# Patient Record
Sex: Female | Born: 1935 | Race: White | Hispanic: No | Smoking: Never smoker
Health system: Southern US, Community
[De-identification: ages and names within clinical notes are randomized; demographics above are authoritative.]

## PROBLEM LIST (undated history)

## (undated) DIAGNOSIS — A159 Respiratory tuberculosis unspecified: Secondary | ICD-10-CM

## (undated) DIAGNOSIS — F319 Bipolar disorder, unspecified: Secondary | ICD-10-CM

## (undated) DIAGNOSIS — M12819 Other specific arthropathies, not elsewhere classified, unspecified shoulder: Secondary | ICD-10-CM

## (undated) DIAGNOSIS — M545 Low back pain, unspecified: Secondary | ICD-10-CM

## (undated) DIAGNOSIS — I10 Essential (primary) hypertension: Secondary | ICD-10-CM

## (undated) DIAGNOSIS — R61 Generalized hyperhidrosis: Secondary | ICD-10-CM

## (undated) DIAGNOSIS — E669 Obesity, unspecified: Secondary | ICD-10-CM

## (undated) DIAGNOSIS — I2510 Atherosclerotic heart disease of native coronary artery without angina pectoris: Secondary | ICD-10-CM

## (undated) DIAGNOSIS — M1990 Unspecified osteoarthritis, unspecified site: Secondary | ICD-10-CM

## (undated) DIAGNOSIS — C801 Malignant (primary) neoplasm, unspecified: Secondary | ICD-10-CM

## (undated) DIAGNOSIS — K922 Gastrointestinal hemorrhage, unspecified: Secondary | ICD-10-CM

## (undated) DIAGNOSIS — F329 Major depressive disorder, single episode, unspecified: Secondary | ICD-10-CM

## (undated) DIAGNOSIS — E785 Hyperlipidemia, unspecified: Secondary | ICD-10-CM

## (undated) DIAGNOSIS — F32A Depression, unspecified: Secondary | ICD-10-CM

## (undated) DIAGNOSIS — J45909 Unspecified asthma, uncomplicated: Secondary | ICD-10-CM

## (undated) DIAGNOSIS — N83209 Unspecified ovarian cyst, unspecified side: Secondary | ICD-10-CM

## (undated) DIAGNOSIS — Q2730 Arteriovenous malformation, site unspecified: Secondary | ICD-10-CM

## (undated) DIAGNOSIS — D649 Anemia, unspecified: Secondary | ICD-10-CM

## (undated) DIAGNOSIS — K449 Diaphragmatic hernia without obstruction or gangrene: Secondary | ICD-10-CM

## (undated) DIAGNOSIS — S6991XA Unspecified injury of right wrist, hand and finger(s), initial encounter: Secondary | ICD-10-CM

## (undated) DIAGNOSIS — R0602 Shortness of breath: Secondary | ICD-10-CM

## (undated) DIAGNOSIS — R569 Unspecified convulsions: Secondary | ICD-10-CM

## (undated) HISTORY — DX: Major depressive disorder, single episode, unspecified: F32.9

## (undated) HISTORY — PX: CATARACT EXTRACTION: SUR2

## (undated) HISTORY — PX: HIATAL HERNIA REPAIR: SHX195

## (undated) HISTORY — PX: BREAST REDUCTION SURGERY: SHX8

## (undated) HISTORY — PX: CRANIOPLASTY: SUR330

## (undated) HISTORY — PX: CYSTECTOMY: SHX5119

## (undated) HISTORY — DX: Depression, unspecified: F32.A

## (undated) HISTORY — PX: KNEE ARTHROPLASTY: SHX992

## (undated) HISTORY — PX: ABDOMINAL HYSTERECTOMY: SHX81

---
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
## 1997-11-21 ENCOUNTER — Encounter: Admission: RE | Admit: 1997-11-21 | Discharge: 1997-11-21 | Payer: Self-pay | Admitting: Family Medicine

## 1998-01-25 ENCOUNTER — Encounter: Admission: RE | Admit: 1998-01-25 | Discharge: 1998-01-25 | Payer: Self-pay | Admitting: Family Medicine

## 1998-06-21 ENCOUNTER — Encounter: Admission: RE | Admit: 1998-06-21 | Discharge: 1998-06-21 | Payer: Self-pay | Admitting: Family Medicine

## 1998-06-22 ENCOUNTER — Encounter: Admission: RE | Admit: 1998-06-22 | Discharge: 1998-06-22 | Payer: Self-pay | Admitting: Family Medicine

## 1998-07-05 ENCOUNTER — Encounter: Admission: RE | Admit: 1998-07-05 | Discharge: 1998-07-05 | Payer: Self-pay | Admitting: Family Medicine

## 1998-10-16 ENCOUNTER — Encounter: Admission: RE | Admit: 1998-10-16 | Discharge: 1998-10-16 | Payer: Self-pay | Admitting: Family Medicine

## 1998-11-06 ENCOUNTER — Encounter: Admission: RE | Admit: 1998-11-06 | Discharge: 1998-11-06 | Payer: Self-pay | Admitting: Family Medicine

## 1999-04-27 ENCOUNTER — Ambulatory Visit (HOSPITAL_COMMUNITY): Admission: RE | Admit: 1999-04-27 | Discharge: 1999-04-27 | Payer: Self-pay | Admitting: Neurology

## 1999-05-08 ENCOUNTER — Ambulatory Visit (HOSPITAL_COMMUNITY): Admission: RE | Admit: 1999-05-08 | Discharge: 1999-05-08 | Payer: Self-pay | Admitting: Gastroenterology

## 1999-05-08 ENCOUNTER — Encounter (INDEPENDENT_AMBULATORY_CARE_PROVIDER_SITE_OTHER): Payer: Self-pay

## 1999-06-18 ENCOUNTER — Encounter: Admission: RE | Admit: 1999-06-18 | Discharge: 1999-06-18 | Payer: Self-pay | Admitting: Family Medicine

## 1999-08-16 ENCOUNTER — Encounter: Admission: RE | Admit: 1999-08-16 | Discharge: 1999-08-16 | Payer: Self-pay | Admitting: Family Medicine

## 1999-08-20 ENCOUNTER — Encounter: Admission: RE | Admit: 1999-08-20 | Discharge: 1999-08-20 | Payer: Self-pay | Admitting: Family Medicine

## 2000-03-26 ENCOUNTER — Encounter: Admission: RE | Admit: 2000-03-26 | Discharge: 2000-03-26 | Payer: Self-pay | Admitting: Family Medicine

## 2000-05-06 ENCOUNTER — Encounter: Admission: RE | Admit: 2000-05-06 | Discharge: 2000-05-06 | Payer: Self-pay | Admitting: Sports Medicine

## 2000-05-23 ENCOUNTER — Encounter: Admission: RE | Admit: 2000-05-23 | Discharge: 2000-05-23 | Payer: Self-pay | Admitting: Family Medicine

## 2000-06-09 ENCOUNTER — Encounter: Admission: RE | Admit: 2000-06-09 | Discharge: 2000-06-09 | Payer: Self-pay | Admitting: Family Medicine

## 2000-10-13 ENCOUNTER — Encounter: Admission: RE | Admit: 2000-10-13 | Discharge: 2000-10-13 | Payer: Self-pay | Admitting: Family Medicine

## 2000-12-16 ENCOUNTER — Encounter: Admission: RE | Admit: 2000-12-16 | Discharge: 2000-12-16 | Payer: Self-pay | Admitting: Family Medicine

## 2001-02-16 ENCOUNTER — Encounter: Admission: RE | Admit: 2001-02-16 | Discharge: 2001-02-16 | Payer: Self-pay | Admitting: Sports Medicine

## 2001-04-23 ENCOUNTER — Encounter: Admission: RE | Admit: 2001-04-23 | Discharge: 2001-04-23 | Payer: Self-pay | Admitting: *Deleted

## 2001-04-23 ENCOUNTER — Encounter: Admission: RE | Admit: 2001-04-23 | Discharge: 2001-04-23 | Payer: Self-pay | Admitting: Family Medicine

## 2001-04-23 ENCOUNTER — Encounter: Payer: Self-pay | Admitting: *Deleted

## 2001-05-07 ENCOUNTER — Encounter: Admission: RE | Admit: 2001-05-07 | Discharge: 2001-05-07 | Payer: Self-pay | Admitting: Sports Medicine

## 2002-01-01 ENCOUNTER — Encounter: Admission: RE | Admit: 2002-01-01 | Discharge: 2002-01-01 | Payer: Self-pay | Admitting: Family Medicine

## 2002-01-19 ENCOUNTER — Encounter: Admission: RE | Admit: 2002-01-19 | Discharge: 2002-01-19 | Payer: Self-pay | Admitting: Sports Medicine

## 2002-05-24 ENCOUNTER — Encounter: Admission: RE | Admit: 2002-05-24 | Discharge: 2002-05-24 | Payer: Self-pay | Admitting: Family Medicine

## 2002-06-08 ENCOUNTER — Encounter: Admission: RE | Admit: 2002-06-08 | Discharge: 2002-06-08 | Payer: Self-pay | Admitting: Family Medicine

## 2002-06-22 ENCOUNTER — Encounter: Admission: RE | Admit: 2002-06-22 | Discharge: 2002-06-22 | Payer: Self-pay | Admitting: Family Medicine

## 2002-06-22 ENCOUNTER — Ambulatory Visit (HOSPITAL_COMMUNITY): Admission: RE | Admit: 2002-06-22 | Discharge: 2002-06-22 | Payer: Self-pay | Admitting: Family Medicine

## 2002-10-14 ENCOUNTER — Encounter: Admission: RE | Admit: 2002-10-14 | Discharge: 2002-10-14 | Payer: Self-pay | Admitting: Family Medicine

## 2003-04-19 ENCOUNTER — Encounter: Payer: Self-pay | Admitting: Ophthalmology

## 2003-04-22 ENCOUNTER — Ambulatory Visit (HOSPITAL_COMMUNITY): Admission: RE | Admit: 2003-04-22 | Discharge: 2003-04-22 | Payer: Self-pay | Admitting: Ophthalmology

## 2003-06-01 ENCOUNTER — Encounter: Admission: RE | Admit: 2003-06-01 | Discharge: 2003-06-01 | Payer: Self-pay | Admitting: Family Medicine

## 2003-11-09 ENCOUNTER — Encounter: Admission: RE | Admit: 2003-11-09 | Discharge: 2003-11-09 | Payer: Self-pay | Admitting: Family Medicine

## 2003-11-25 ENCOUNTER — Ambulatory Visit (HOSPITAL_COMMUNITY): Admission: RE | Admit: 2003-11-25 | Discharge: 2003-11-25 | Payer: Self-pay | Admitting: Ophthalmology

## 2003-12-28 ENCOUNTER — Ambulatory Visit (HOSPITAL_COMMUNITY): Admission: RE | Admit: 2003-12-28 | Discharge: 2003-12-28 | Payer: Self-pay | Admitting: Ophthalmology

## 2004-03-26 ENCOUNTER — Encounter: Admission: RE | Admit: 2004-03-26 | Discharge: 2004-03-26 | Payer: Self-pay | Admitting: Sports Medicine

## 2004-05-29 ENCOUNTER — Ambulatory Visit: Payer: Self-pay | Admitting: Family Medicine

## 2004-07-12 ENCOUNTER — Encounter (INDEPENDENT_AMBULATORY_CARE_PROVIDER_SITE_OTHER): Payer: Self-pay | Admitting: *Deleted

## 2004-09-10 ENCOUNTER — Ambulatory Visit: Payer: Self-pay | Admitting: Family Medicine

## 2005-02-25 ENCOUNTER — Ambulatory Visit: Payer: Self-pay | Admitting: Sports Medicine

## 2005-06-14 ENCOUNTER — Ambulatory Visit: Payer: Self-pay | Admitting: Family Medicine

## 2005-06-24 ENCOUNTER — Ambulatory Visit: Payer: Self-pay | Admitting: Family Medicine

## 2006-06-10 ENCOUNTER — Ambulatory Visit (HOSPITAL_COMMUNITY): Admission: RE | Admit: 2006-06-10 | Discharge: 2006-06-10 | Payer: Self-pay | Admitting: Family Medicine

## 2006-06-10 ENCOUNTER — Ambulatory Visit: Payer: Self-pay | Admitting: Sports Medicine

## 2006-09-10 ENCOUNTER — Inpatient Hospital Stay (HOSPITAL_COMMUNITY): Admission: RE | Admit: 2006-09-10 | Discharge: 2006-09-14 | Payer: Self-pay | Admitting: Orthopedic Surgery

## 2006-10-06 ENCOUNTER — Encounter: Admission: RE | Admit: 2006-10-06 | Discharge: 2006-10-24 | Payer: Self-pay | Admitting: Orthopedic Surgery

## 2006-10-09 DIAGNOSIS — M159 Polyosteoarthritis, unspecified: Secondary | ICD-10-CM | POA: Insufficient documentation

## 2006-10-09 DIAGNOSIS — R569 Unspecified convulsions: Secondary | ICD-10-CM | POA: Insufficient documentation

## 2006-10-09 DIAGNOSIS — E7800 Pure hypercholesterolemia, unspecified: Secondary | ICD-10-CM

## 2006-10-09 DIAGNOSIS — I10 Essential (primary) hypertension: Secondary | ICD-10-CM

## 2006-10-09 DIAGNOSIS — F411 Generalized anxiety disorder: Secondary | ICD-10-CM | POA: Insufficient documentation

## 2006-10-09 DIAGNOSIS — I209 Angina pectoris, unspecified: Secondary | ICD-10-CM

## 2006-10-09 DIAGNOSIS — M1990 Unspecified osteoarthritis, unspecified site: Secondary | ICD-10-CM

## 2006-10-09 DIAGNOSIS — M069 Rheumatoid arthritis, unspecified: Secondary | ICD-10-CM | POA: Insufficient documentation

## 2006-10-09 DIAGNOSIS — F339 Major depressive disorder, recurrent, unspecified: Secondary | ICD-10-CM

## 2006-10-09 DIAGNOSIS — G609 Hereditary and idiopathic neuropathy, unspecified: Secondary | ICD-10-CM

## 2006-10-10 ENCOUNTER — Encounter (INDEPENDENT_AMBULATORY_CARE_PROVIDER_SITE_OTHER): Payer: Self-pay | Admitting: *Deleted

## 2006-10-17 ENCOUNTER — Telehealth: Payer: Self-pay | Admitting: *Deleted

## 2006-10-17 ENCOUNTER — Ambulatory Visit: Payer: Self-pay | Admitting: Sports Medicine

## 2007-03-09 ENCOUNTER — Encounter: Payer: Self-pay | Admitting: Family Medicine

## 2007-03-10 ENCOUNTER — Telehealth: Payer: Self-pay | Admitting: Family Medicine

## 2007-04-10 ENCOUNTER — Ambulatory Visit: Payer: Self-pay | Admitting: Family Medicine

## 2007-04-14 ENCOUNTER — Telehealth: Payer: Self-pay | Admitting: Family Medicine

## 2007-04-16 ENCOUNTER — Encounter: Payer: Self-pay | Admitting: Family Medicine

## 2007-06-15 ENCOUNTER — Encounter: Payer: Self-pay | Admitting: Family Medicine

## 2007-06-25 ENCOUNTER — Ambulatory Visit: Payer: Self-pay | Admitting: Family Medicine

## 2007-09-04 ENCOUNTER — Ambulatory Visit (HOSPITAL_COMMUNITY): Admission: RE | Admit: 2007-09-04 | Discharge: 2007-09-04 | Payer: Self-pay | Admitting: Family Medicine

## 2007-09-04 ENCOUNTER — Encounter: Payer: Self-pay | Admitting: Family Medicine

## 2007-09-04 ENCOUNTER — Ambulatory Visit: Payer: Self-pay | Admitting: Family Medicine

## 2007-09-04 DIAGNOSIS — R002 Palpitations: Secondary | ICD-10-CM | POA: Insufficient documentation

## 2007-09-07 ENCOUNTER — Telehealth: Payer: Self-pay | Admitting: Family Medicine

## 2007-09-07 LAB — CONVERTED CEMR LAB
ALT: 17 units/L (ref 0–35)
AST: 23 units/L (ref 0–37)
Albumin: 4.4 g/dL (ref 3.5–5.2)
Alkaline Phosphatase: 78 units/L (ref 39–117)
Calcium: 9.4 mg/dL (ref 8.4–10.5)
Chloride: 103 meq/L (ref 96–112)
Cholesterol: 173 mg/dL (ref 0–200)
HDL: 46 mg/dL (ref >39)
LDL Cholesterol: 92 mg/dL (ref 0–99)
TSH: 1.104 microintl units/mL (ref 0.350–5.50)
Total Bilirubin: 0.6 mg/dL (ref 0.3–1.2)
Total CHOL/HDL Ratio: 3.8
Total Protein: 7 g/dL (ref 6.0–8.3)
VLDL: 35 mg/dL (ref 0–40)

## 2007-09-09 ENCOUNTER — Encounter: Payer: Self-pay | Admitting: Family Medicine

## 2007-09-09 ENCOUNTER — Inpatient Hospital Stay (HOSPITAL_COMMUNITY): Admission: RE | Admit: 2007-09-09 | Discharge: 2007-09-12 | Payer: Self-pay | Admitting: Orthopedic Surgery

## 2007-09-25 ENCOUNTER — Telehealth: Payer: Self-pay | Admitting: *Deleted

## 2007-10-26 ENCOUNTER — Telehealth (INDEPENDENT_AMBULATORY_CARE_PROVIDER_SITE_OTHER): Payer: Self-pay | Admitting: *Deleted

## 2007-11-26 ENCOUNTER — Encounter: Payer: Self-pay | Admitting: Family Medicine

## 2008-01-26 ENCOUNTER — Telehealth: Payer: Self-pay | Admitting: Family Medicine

## 2008-02-10 ENCOUNTER — Ambulatory Visit: Payer: Self-pay | Admitting: Family Medicine

## 2008-05-06 ENCOUNTER — Ambulatory Visit: Payer: Self-pay | Admitting: Family Medicine

## 2008-05-06 ENCOUNTER — Encounter: Payer: Self-pay | Admitting: *Deleted

## 2008-06-30 ENCOUNTER — Encounter (INDEPENDENT_AMBULATORY_CARE_PROVIDER_SITE_OTHER): Payer: Self-pay | Admitting: Family Medicine

## 2008-07-26 ENCOUNTER — Ambulatory Visit: Payer: Self-pay | Admitting: Family Medicine

## 2008-08-15 ENCOUNTER — Ambulatory Visit: Payer: Self-pay | Admitting: Family Medicine

## 2008-08-16 ENCOUNTER — Encounter (INDEPENDENT_AMBULATORY_CARE_PROVIDER_SITE_OTHER): Payer: Self-pay | Admitting: Family Medicine

## 2008-08-18 ENCOUNTER — Encounter: Admission: RE | Admit: 2008-08-18 | Discharge: 2008-10-25 | Payer: Self-pay | Admitting: Neurology

## 2008-08-26 ENCOUNTER — Encounter (INDEPENDENT_AMBULATORY_CARE_PROVIDER_SITE_OTHER): Payer: Self-pay | Admitting: Family Medicine

## 2008-08-29 ENCOUNTER — Encounter: Admission: RE | Admit: 2008-08-29 | Discharge: 2008-08-29 | Payer: Self-pay | Admitting: Neurology

## 2008-10-20 ENCOUNTER — Encounter: Admission: RE | Admit: 2008-10-20 | Discharge: 2008-10-20 | Payer: Self-pay | Admitting: Neurology

## 2008-10-24 ENCOUNTER — Encounter (INDEPENDENT_AMBULATORY_CARE_PROVIDER_SITE_OTHER): Payer: Self-pay | Admitting: Family Medicine

## 2008-10-27 ENCOUNTER — Encounter (INDEPENDENT_AMBULATORY_CARE_PROVIDER_SITE_OTHER): Payer: Self-pay | Admitting: Family Medicine

## 2009-02-20 ENCOUNTER — Telehealth: Payer: Self-pay | Admitting: Family Medicine

## 2009-02-21 ENCOUNTER — Ambulatory Visit: Payer: Self-pay | Admitting: Family Medicine

## 2009-02-21 DIAGNOSIS — N63 Unspecified lump in unspecified breast: Secondary | ICD-10-CM | POA: Insufficient documentation

## 2009-03-02 ENCOUNTER — Encounter: Payer: Self-pay | Admitting: Family Medicine

## 2009-03-02 ENCOUNTER — Ambulatory Visit: Payer: Self-pay | Admitting: Family Medicine

## 2009-03-02 ENCOUNTER — Inpatient Hospital Stay (HOSPITAL_COMMUNITY): Admission: EM | Admit: 2009-03-02 | Discharge: 2009-03-14 | Payer: Self-pay | Admitting: Emergency Medicine

## 2009-03-06 ENCOUNTER — Encounter (INDEPENDENT_AMBULATORY_CARE_PROVIDER_SITE_OTHER): Payer: Self-pay | Admitting: Surgery

## 2009-03-08 ENCOUNTER — Encounter: Payer: Self-pay | Admitting: Family Medicine

## 2009-03-16 ENCOUNTER — Ambulatory Visit: Payer: Self-pay | Admitting: Family Medicine

## 2009-03-16 DIAGNOSIS — R0989 Other specified symptoms and signs involving the circulatory and respiratory systems: Secondary | ICD-10-CM

## 2009-03-16 DIAGNOSIS — R011 Cardiac murmur, unspecified: Secondary | ICD-10-CM

## 2009-03-17 ENCOUNTER — Telehealth: Payer: Self-pay | Admitting: Family Medicine

## 2009-03-20 ENCOUNTER — Telehealth: Payer: Self-pay | Admitting: Family Medicine

## 2009-03-21 ENCOUNTER — Encounter: Payer: Self-pay | Admitting: Family Medicine

## 2009-03-27 ENCOUNTER — Telehealth: Payer: Self-pay | Admitting: *Deleted

## 2009-04-04 ENCOUNTER — Encounter: Payer: Self-pay | Admitting: Family Medicine

## 2009-05-17 ENCOUNTER — Ambulatory Visit: Payer: Self-pay | Admitting: Family Medicine

## 2009-05-18 ENCOUNTER — Encounter: Payer: Self-pay | Admitting: Family Medicine

## 2009-06-06 ENCOUNTER — Ambulatory Visit: Payer: Self-pay | Admitting: Family Medicine

## 2009-06-06 ENCOUNTER — Encounter: Payer: Self-pay | Admitting: Family Medicine

## 2009-06-06 LAB — CONVERTED CEMR LAB
ALT: 12 units/L (ref 0–35)
Albumin: 4.1 g/dL (ref 3.5–5.2)
BUN: 16 mg/dL (ref 6–23)
CO2: 22 meq/L (ref 19–32)
Calcium: 9.4 mg/dL (ref 8.4–10.5)
Chloride: 105 meq/L (ref 96–112)
Cholesterol: 156 mg/dL (ref 0–200)
Glucose, Bld: 90 mg/dL (ref 70–99)
HDL: 53 mg/dL (ref >39)
LDL Cholesterol: 77 mg/dL (ref 0–99)
Potassium: 3.8 meq/L (ref 3.5–5.3)
Sodium: 141 meq/L (ref 135–145)
TSH: 1.504 microintl units/mL (ref 0.350–4.500)
Total Protein: 6.9 g/dL (ref 6.0–8.3)
VLDL: 26 mg/dL (ref 0–40)

## 2009-07-17 ENCOUNTER — Encounter: Payer: Self-pay | Admitting: Family Medicine

## 2009-10-26 ENCOUNTER — Telehealth: Payer: Self-pay | Admitting: Family Medicine

## 2009-10-27 ENCOUNTER — Ambulatory Visit: Payer: Self-pay | Admitting: Family Medicine

## 2009-11-10 ENCOUNTER — Encounter: Payer: Self-pay | Admitting: Family Medicine

## 2009-12-07 ENCOUNTER — Ambulatory Visit: Payer: Self-pay | Admitting: Family Medicine

## 2009-12-07 DIAGNOSIS — M940 Chondrocostal junction syndrome [Tietze]: Secondary | ICD-10-CM | POA: Insufficient documentation

## 2009-12-15 ENCOUNTER — Ambulatory Visit: Payer: Self-pay | Admitting: Family Medicine

## 2010-03-14 ENCOUNTER — Ambulatory Visit: Payer: Self-pay | Admitting: Family Medicine

## 2010-03-14 DIAGNOSIS — M67919 Unspecified disorder of synovium and tendon, unspecified shoulder: Secondary | ICD-10-CM | POA: Insufficient documentation

## 2010-03-14 DIAGNOSIS — M719 Bursopathy, unspecified: Secondary | ICD-10-CM

## 2010-03-14 DIAGNOSIS — R269 Unspecified abnormalities of gait and mobility: Secondary | ICD-10-CM | POA: Insufficient documentation

## 2010-03-29 ENCOUNTER — Encounter: Admission: RE | Admit: 2010-03-29 | Discharge: 2010-04-25 | Payer: Self-pay | Admitting: Family Medicine

## 2010-04-11 ENCOUNTER — Telehealth: Payer: Self-pay | Admitting: Family Medicine

## 2010-04-11 ENCOUNTER — Encounter: Payer: Self-pay | Admitting: Family Medicine

## 2010-05-08 ENCOUNTER — Telehealth: Payer: Self-pay | Admitting: *Deleted

## 2010-05-23 ENCOUNTER — Telehealth: Payer: Self-pay | Admitting: Family Medicine

## 2010-05-25 ENCOUNTER — Ambulatory Visit: Payer: Self-pay | Admitting: Family Medicine

## 2010-09-02 ENCOUNTER — Encounter: Payer: Self-pay | Admitting: Neurology

## 2010-09-02 ENCOUNTER — Encounter: Payer: Self-pay | Admitting: Orthopedic Surgery

## 2010-09-11 NOTE — Progress Notes (Signed)
Summary: Note Needed  Phone Note Call from Patient Call back at Home Phone [phone]   Caller: Patient Summary of Call: Pt stating that the letter he did needs to be on a rx pad.  The city will not except just a letter.  Can we mail this to her. Initial call taken by: Clydell Hakim,  May 08, 2010 8:54 AM  Follow-up for Phone Call        letter printed out on rx paper and mailed to pt Follow-up by: Loralee Pacas CMA,  May 08, 2010 9:11 AM

## 2010-09-11 NOTE — Assessment & Plan Note (Signed)
Summary: see notes/[place]   Vital Signs:  Patient profile:   77 year old female Height:      64.25 inches Weight:      220.0 pounds BMI:     37.61 Temp:     98.3 degrees F oral Pulse rate:   81 / minute BP sitting:   148 / 79  (left arm) Cuff size:   regular  Vitals Entered By: Gladstone Pih (October 27, 2009 2:19 PM) CC: C/O jaw pain   Primary Care Provider:  Romero Belling MD  CC:  C/O jaw pain.  History of Present Illness: Patient had a fleeting episode of jaw pain and swelling.  States that yesterday her jaw and lymph nodes were significantly swollen on her right side.  Seems to have returned to normal today.  Does have mild post nasal drip and symptoms of mild eustacian tube dysfunction.  Denies any tooth pain or problems Sees dentist regularly.  No fever or infectious exposure.   Habits & Providers  Alcohol-Tobacco-Diet     Tobacco Status: never  Current Medications (verified): 1)  Hyzaar 100-25 Mg  Tabs (Losartan Potassium-Hctz) .Marland Kitchen.. 1 Tab By Mouth Daily. 2)  Lipitor 10 Mg Tabs (Atorvastatin Calcium) .... Take 1 Tablet By Mouth At Bedtime 3)  Prozac 20 Mg  Caps (Fluoxetine Hcl) .... 3 Caps Daily 4)  Prilosec 20 Mg  Cpdr (Omeprazole) .... Take One Tablet Daily 5)  Ventolin Hfa 108 (90 Base) Mcg/act Aers (Albuterol Sulfate) .... Inhale One Puff Every 4-6 Hours As Needed 6)  Zovirax 5 % Oint (Acyclovir) .... Disp 15 Gm Tube.  Apply To Affected Area Three Times A Day During Outbreak  Allergies (verified): 1)  Amoxicillin (Amoxicillin) 2)  Dilantin (Phenytoin Sodium Extended) 3)  Morphine Sulfate (Morphine Sulfate) 4)  Phenobarbital (Phenobarbital)  Past History:  Past medical, surgical, family and social histories (including risk factors) reviewed, and no changes noted (except as noted below).  Past Medical History: Reviewed history from 08/26/2008 and no changes required. hiatal hernia hx of GI Bleed meningioma s/p resection 1988 remote carpal tunnel syndrome Mental  illness dx ranging from bipolar, depression w/ psychotic features, personality disorder starting in the early 1950s HTN  asthma? DEXA 08/16/08 - normal but statistically significant decrease in BMD compared to previous study, Needs FU in 2 years   Past Surgical History: Reviewed history from 03/16/2009 and no changes required. Hilatal hernia repair 03/2009 breast reduction - 09/04/2001 BTL - 09/04/2001 TAH R oophorectomy - 09/04/2001 1/88 - Brain Meningioma surgery on the right removed, was intubated s/p sugery  Family History: Reviewed history from 08/15/2008 and no changes required. Father w/ 2 suicide attempts 2 aunts committed suicide family hx of mental illness- dx unknown Father died 61 yo from MI. Mom - Alzheimers Brother - MI at age 5s for first heart attack. Bypass x 5.   Social History: Reviewed history from 08/15/2008 and no changes required. 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 Vegetarian almost. Says she has a good diet. 3 husbands in the past. No boyfriends now.  Not sexually active for many years.  Physical Exam  General:  Well-developed,well-nourished,in no acute distress; alert,appropriate and cooperative throughout examination Ears:  External ear exam shows no significant lesions or deformities.  Otoscopic examination reveals clear canals, tympanic membranes are intact bilaterally without bulging, retraction, inflammation or discharge. Hearing is grossly normal bilaterally. Nose:  External nasal examination shows no deformity or inflammation.  Nasal mucosa are pink and moist without lesions or exudates. Mouth:  Oral mucosa and oropharynx without lesions or exudates.  Teeth in good repair. Neck:  No deformities, masses, or tenderness noted. Lungs:  Normal respiratory effort, chest expands symmetrically. Lungs are clear to auscultation, no crackles or wheezes.   Impression &  Recommendations:  Problem # 1:  JAW PAIN (ICD-526.9)  Transient and resolved.  No evidence of significant underlying pathology.    Orders: [facility]- Est Level  3 ([address])  Complete Medication List: 1)  Hyzaar 100-25 Mg Tabs (Losartan potassium-hctz) .Marland Kitchen.. 1 tab by mouth daily. 2)  Lipitor 10 Mg Tabs (Atorvastatin calcium) .... Take 1 tablet by mouth at bedtime 3)  Prozac 20 Mg Caps (Fluoxetine hcl) .... 3 caps daily 4)  Prilosec 20 Mg Cpdr (Omeprazole) .... Take one tablet daily 5)  Ventolin Hfa 108 (90 Base) Mcg/act Aers (Albuterol sulfate) .... Inhale one puff every 4-6 hours as needed 6)  Zovirax 5 % Oint (Acyclovir) .... Disp 15 gm tube.  apply to affected area three times a day during outbreak  Other Orders: Mammogram (Screening) (Mammo)  Patient Instructions: 1)  Please make sure you get your mammogram. 2)  Let me know if the jaw pain or swelling recurrs.     Prevention & Chronic Care Immunizations   Influenza vaccine: Fluvax MCR  (05/17/2009)   Influenza vaccine due: 05/06/2009    Tetanus booster: 04/12/2001: Done.   Tetanus booster due: 04/13/2011    Pneumococcal vaccine: Done.  (08/12/1998)   Pneumococcal vaccine deferral: Not indicated  (05/17/2009)   Pneumococcal vaccine due: None    H. zoster vaccine: Not documented  Colorectal Screening   Hemoccult: Done.  (10/11/2003)   Hemoccult due: Not Indicated    Colonoscopy: 2 tubular adenomas - no high grade dysplasia  (10/07/2008)   Colonoscopy due: 10/07/2013  Other Screening   Pap smear: Done.  (07/12/2004)   Pap smear due: Not Indicated    Mammogram: normal  (06/30/2008)   Mammogram action/deferral: Ordered  (10/27/2009)   Mammogram due: 06/30/2009    DXA bone density scan: normal  (08/16/2008)   DXA scan due: 08/16/2010    Smoking status: never  (10/27/2009)  Lipids   Total Cholesterol: 156  (06/06/2009)   LDL: 77  (06/06/2009)   LDL Direct: Not documented   HDL: 53  (06/06/2009)   Triglycerides:  131  (06/06/2009)    SGOT (AST): 18  (06/06/2009)   SGPT (ALT): 12  (06/06/2009)   Alkaline phosphatase: 77  (06/06/2009)   Total bilirubin: 0.6  (06/06/2009)  Hypertension   Last Blood Pressure: 148 / 79  (10/27/2009)   Serum creatinine: 0.69  (06/06/2009)   Serum potassium 3.8  (06/06/2009)  Self-Management Support :   Personal Goals (by the next clinic visit) :      Personal blood pressure goal: 140/90  (05/17/2009)     Personal LDL goal: 130  (05/17/2009)    Hypertension self-management support: Written self-care plan  (05/17/2009)    Lipid self-management support: Written self-care plan  (05/17/2009)    Nursing Instructions: Schedule screening mammogram (see order)

## 2010-09-11 NOTE — Progress Notes (Signed)
Summary: needs note  Phone Note Call from Patient Call back at Home Phone [phone]   Caller: Patient Summary of Call: needs a note handwritten on a rx pad stating that she can excersise.  they did not accept the previous note - only can be written on a script pad. Initial call taken by: De Nurse,  May 23, 2010 10:06 AM  Follow-up for Phone Call        Note written on Rx and place up front for patient to pick up.   Follow-up by: Doralee Albino MD,  May 23, 2010 10:36 AM  Additional Follow-up for Phone Call Additional follow up Details #1::        spoke with pt and informed about rx ready to be picked up Additional Follow-up by: Jimmy Footman, CMA,  May 23, 2010 10:53 AM

## 2010-09-11 NOTE — Progress Notes (Signed)
Summary: needs note  Phone Note Call from Patient Call back at Home Phone [phone]   Caller: Patient Summary of Call: needs note to work out in gym - needs it to say that she is in good condition to weight lift and work in gym.  [facility] [address] [place] [address] Initial call taken by: De Nurse,  April 11, 2010 9:37 AM  Follow-up for Phone Call        will do Follow-up by: Doralee Albino MD,  April 11, 2010 12:18 PM

## 2010-09-11 NOTE — Assessment & Plan Note (Signed)
Summary: F/U/KH   Vital Signs:  Patient profile:   77 year old female Height:      64.25 inches Weight:      226.6 pounds BMI:     38.73 Temp:     97.8 degrees F oral Pulse rate:   73 / minute BP sitting:   150 / 79  (left arm) Cuff size:   large  Vitals Entered By: Gladstone Pih (Dec 15, 2009 3:16 PM)  Serial Vital Signs/Assessments:  Time      Position  BP       Pulse  Resp  Temp     By                     138/82                         Doralee Albino MD  CC: F/U Is Patient Diabetic? No Pain Assessment Patient in pain? no        Primary Care Provider:  Romero Belling MD  CC:  F/U.  History of Present Illness: No C/O acutely. Suffers from chronic depression - at baseline.  Has intermitant suicidal thoughts but no plan.  Has attempted suicide mult times in past. Concerned re heart murmur Wants Vit D level checked.  Habits & Providers  Alcohol-Tobacco-Diet     Tobacco Status: never  Current Medications (verified): 1)  Hyzaar 100-25 Mg  Tabs (Losartan Potassium-Hctz) .Marland Kitchen.. 1 Tab By Mouth Daily. 2)  Lipitor 10 Mg Tabs (Atorvastatin Calcium) .... Take 1 Tablet By Mouth At Bedtime 3)  Prozac 20 Mg  Caps (Fluoxetine Hcl) .... 3 Caps Daily 4)  Prilosec 20 Mg  Cpdr (Omeprazole) .... Take One Tablet Daily 5)  Ventolin Hfa 108 (90 Base) Mcg/act Aers (Albuterol Sulfate) .... Inhale One Puff Every 4-6 Hours As Needed 6)  Zovirax 5 % Oint (Acyclovir) .... Disp 15 Gm Tube.  Apply To Affected Area Three Times A Day During Outbreak 7)  Ibuprofen 800 Mg Tabs (Ibuprofen) .Marland Kitchen.. 1 Tab By Mouth Every 8 Hours Schedule For 2 Days Then As Needed But No Longer Than 10 Days  Allergies (verified): 1)  Amoxicillin (Amoxicillin) 2)  Dilantin (Phenytoin Sodium Extended) 3)  Morphine Sulfate (Morphine Sulfate) 4)  Phenobarbital (Phenobarbital)  Past History:  Past medical, surgical, family and social histories (including risk factors) reviewed, and no changes noted (except as noted  below).  Past Medical History: Reviewed history from 08/26/2008 and no changes required. hiatal hernia hx of GI Bleed meningioma s/p resection 1988 remote carpal tunnel syndrome Mental illness dx ranging from bipolar, depression w/ psychotic features, personality disorder starting in the early 1950s HTN  asthma? DEXA 08/16/08 - normal but statistically significant decrease in BMD compared to previous study, Needs FU in 2 years   Past Surgical History: Reviewed history from 03/16/2009 and no changes required. Hilatal hernia repair 03/2009 breast reduction - 09/04/2001 BTL - 09/04/2001 TAH R oophorectomy - 09/04/2001 1/88 - Brain Meningioma surgery on the right removed, was intubated s/p sugery  Family History: Reviewed history from 08/15/2008 and no changes required. Father w/ 2 suicide attempts 2 aunts committed suicide family hx of mental illness- dx unknown Father died 55 yo from MI. Mom - Alzheimers Brother - MI at age 48s for first heart attack. Bypass x 5.   Social History: Reviewed history from 08/15/2008 and no changes required. enjoys internet and keeping up with medical literature; retired Engineer, civil (consulting); keeps  dogs for [employer]:Never smoked in the past. Vegetarian almost. Says she has a good diet. 3 husbands in the past. No boyfriends now.  Not sexually active for many years.  Physical Exam  General:  six lb wt gain noted.   Neck:  No deformities, masses, or tenderness noted. Lungs:  Normal respiratory effort, chest expands symmetrically. Lungs are clear to auscultation, no crackles or wheezes. Heart:  2/6 SEM.  No diastolic componant.  Good pulses Abdomen:  Bowel sounds positive,abdomen soft and non-tender without masses, organomegaly or hernias noted. Extremities:  Bilateral 1+ edema   Impression & Recommendations:  Problem # 1:  BIPOLAR DISORDER (ICD-296.7) Seems at baseline Orders: [facility]- Est Level  3 ([address])  Problem # 2:  CARDIAC MURMUR (ICD-785.2) No further WU for  now Orders: [facility]- Est Level  3 ([address])  Problem # 3:  HYPERTENSION, BENIGN SYSTEMIC (ICD-401.1) BP good but with weight gain and edema, reemphasize low sodium diet. Her updated medication list for this problem includes:    Hyzaar 100-25 Mg Tabs (Losartan potassium-hctz) .Marland Kitchen... 1 tab by mouth daily.  Complete Medication List: 1)  Hyzaar 100-25 Mg Tabs (Losartan potassium-hctz) .Marland Kitchen.. 1 tab by mouth daily. 2)  Lipitor 10 Mg Tabs (Atorvastatin calcium) .... Take 1 tablet by mouth at bedtime 3)  Prozac 20 Mg Caps (Fluoxetine hcl) .... 3 caps daily 4)  Prilosec 20 Mg Cpdr (Omeprazole) .... Take one tablet daily 5)  Ventolin Hfa 108 (90 Base) Mcg/act Aers (Albuterol sulfate) .... Inhale one puff every 4-6 hours as needed 6)  Zovirax 5 % Oint (Acyclovir) .... Disp 15 gm tube.  apply to affected area three times a day during outbreak 7)  Ibuprofen 800 Mg Tabs (Ibuprofen) .Marland Kitchen.. 1 tab by mouth every 8 hours schedule for 2 days then as needed but no longer than 10 days  Other Orders: Vit D, 25 [facility] ([id number]) Prescriptions: PROZAC 20 MG  CAPS (FLUOXETINE HCL) 3 caps daily  #96 x 12   Entered and Authorized by:   Doralee Albino MD   Signed by:   Doralee Albino MD on 12/15/2009   Method used:   Electronically to        [facility] [phone]* (retail)       [address]       [place], [place]  [address]       Ph: [phone]       Fax: [phone]   RxID:   [id number]      Prevention & Chronic Care Immunizations   Influenza vaccine: Fluvax MCR  (05/17/2009)   Influenza vaccine due: 05/06/2009    Tetanus booster: 04/12/2001: Done.   Tetanus booster due: 04/13/2011    Pneumococcal vaccine: Done.  (08/12/1998)   Pneumococcal vaccine deferral: Not indicated  (05/17/2009)   Pneumococcal vaccine due: None    H. zoster vaccine: Not documented  Colorectal Screening   Hemoccult: Done.  (10/11/2003)   Hemoccult due: Not Indicated    Colonoscopy: 2 tubular adenomas - no high  grade dysplasia  (10/07/2008)   Colonoscopy due: 10/07/2013  Other Screening   Pap smear: Done.  (07/12/2004)   Pap smear due: Not Indicated    Mammogram: Assessment: BIRADS 1.   (11/10/2009)   Mammogram action/deferral: Ordered  (10/27/2009)   Mammogram due: 11/2010    DXA bone density scan: normal  (08/16/2008)   DXA scan due: 08/16/2010    Smoking status: never  (12/15/2009)  Lipids   Total Cholesterol: 156  (06/06/2009)   LDL:  77  (06/06/2009)   LDL Direct: Not documented   HDL: 53  (06/06/2009)   Triglycerides: 131  (06/06/2009)    SGOT (AST): 18  (06/06/2009)   SGPT (ALT): 12  (06/06/2009)   Alkaline phosphatase: 77  (06/06/2009)   Total bilirubin: 0.6  (06/06/2009)    Lipid flowsheet reviewed?: Yes   Progress toward LDL goal: At goal  Hypertension   Last Blood Pressure: 150 / 79  (12/15/2009)   Serum creatinine: 0.69  (06/06/2009)   Serum potassium 3.8  (06/06/2009)    Hypertension flowsheet reviewed?: Yes   Progress toward BP goal: Unchanged  Self-Management Support :   Personal Goals (by the next clinic visit) :      Personal blood pressure goal: 140/90  (05/17/2009)     Personal LDL goal: 130  (05/17/2009)    Hypertension self-management support: Written self-care plan  (05/17/2009)    Lipid self-management support: Written self-care plan  (05/17/2009)

## 2010-09-11 NOTE — Letter (Signed)
Summary: Generic Letter  [facility]  [address]   [place], [place] [address]   Phone: [phone]  Fax: [phone]    04/11/2010  [name] [address] [place], [place]  [address]  Dear [name],    I am happy to provide a letter certifying that you are in good medical condition and can work out Product manager) at the gym/sen[facility] center.  Please let me know if you need further documentation.   Sincerely,       Doralee Albino MD

## 2010-09-11 NOTE — Assessment & Plan Note (Signed)
Summary: shoulder pain,tcb   Vital Signs:  Patient profile:   77 year old female Weight:      222.2 pounds Temp:     98.3 degrees F oral Pulse rate:   63 / minute Pulse rhythm:   regular BP sitting:   149 / 81  (left arm) Cuff size:   large  Vitals Entered By: Loralee Pacas CMA (March 14, 2010 3:35 PM) CC: right shoulder pain   Primary Care Provider:  Doralee Albino MD  CC:  right shoulder pain.  History of Present Illness: Right shoulder pain.  Feels it is rotator cuff syndrome, which she has had before,  If yes,  would like physical therapy.  Also, feels imbalance with walking.  Saw PT about this several months ago.  A single PT session was quite helpful.  She would like to repeat.    Current Medications (verified): 1)  Hyzaar 100-25 Mg  Tabs (Losartan Potassium-Hctz) .Marland Kitchen.. 1 Tab By Mouth Daily. 2)  Lipitor 10 Mg Tabs (Atorvastatin Calcium) .... Take 1 Tablet By Mouth At Bedtime 3)  Prozac 20 Mg  Caps (Fluoxetine Hcl) .... 3 Caps Daily 4)  Prilosec 20 Mg  Cpdr (Omeprazole) .... Take One Tablet Daily 5)  Ventolin Hfa 108 (90 Base) Mcg/act Aers (Albuterol Sulfate) .... Inhale One Puff Every 4-6 Hours As Needed 6)  Zovirax 5 % Oint (Acyclovir) .... Disp 15 Gm Tube.  Apply To Affected Area Three Times A Day During Outbreak 7)  Ibuprofen 800 Mg Tabs (Ibuprofen) .Marland Kitchen.. 1 Tab By Mouth Every 8 Hours Schedule For 2 Days Then As Needed But No Longer Than 10 Days  Allergies (verified): 1)  Amoxicillin (Amoxicillin) 2)  Dilantin (Phenytoin Sodium Extended) 3)  Morphine Sulfate (Morphine Sulfate) 4)  Phenobarbital (Phenobarbital)  Past History:  Past medical, surgical, family and social histories (including risk factors) reviewed, and no changes noted (except as noted below).  Past Medical History: Reviewed history from 08/26/2008 and no changes required. hiatal hernia hx of GI Bleed meningioma s/p resection 1988 remote carpal tunnel syndrome Mental illness dx ranging from  bipolar, depression w/ psychotic features, personality disorder starting in the early 1950s HTN  asthma? DEXA 08/16/08 - normal but statistically significant decrease in BMD compared to previous study, Needs FU in 2 years   Past Surgical History: Reviewed history from 03/16/2009 and no changes required. Hilatal hernia repair 03/2009 breast reduction - 09/04/2001 BTL - 09/04/2001 TAH R oophorectomy - 09/04/2001 1/88 - Brain Meningioma surgery on the right removed, was intubated s/p sugery  Family History: Reviewed history from 08/15/2008 and no changes required. Father w/ 2 suicide attempts 2 aunts committed suicide family hx of mental illness- dx unknown Father died 60 yo from MI. Mom - Alzheimers Brother - MI at age 68s for first heart attack. Bypass x 5.   Social History: Reviewed history from 08/15/2008 and no changes required. 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 Vegetarian almost. Says she has a good diet. 3 husbands in the past. No boyfriends now.  Not sexually active for many years.  Physical Exam  General:  Well-developed,well-nourished,in no acute distress; alert,appropriate and cooperative throughout examination Extremities:  Right shoulder, positve impingment.     Impression & Recommendations:  Problem # 1:  ROTATOR CUFF TENDONITIS (ICD-726.10) Start with PT,  consider steroid injection if persists. Orders: Physical Therapy Referral (PT) [facility]- Est Level  3 ([address])  Problem # 2:  GAIT DISTURBANCE (ICD-781.2)  Orders: Physical Therapy Referral (PT) [facility]- Est Level  3 ([id number])  Complete Medication List: 1)  Hyzaar 100-25 Mg Tabs (Losartan potassium-hctz) .Marland Kitchen.. 1 tab by mouth daily. 2)  Lipitor 10 Mg Tabs (Atorvastatin calcium) .... Take 1 tablet by mouth at bedtime 3)  Prozac 20 Mg Caps (Fluoxetine hcl) .... 3 caps daily 4)  Prilosec 20 Mg Cpdr (Omeprazole) .... Take one tablet daily 5)   Ventolin Hfa 108 (90 Base) Mcg/act Aers (Albuterol sulfate) .... Inhale one puff every 4-6 hours as needed 6)  Zovirax 5 % Oint (Acyclovir) .... Disp 15 gm tube.  apply to affected area three times a day during outbreak 7)  Ibuprofen 800 Mg Tabs (Ibuprofen) .Marland Kitchen.. 1 tab by mouth every 8 hours schedule for 2 days then as needed but no longer than 10 days   Prevention & Chronic Care Immunizations   Influenza vaccine: Fluvax MCR  (05/17/2009)   Influenza vaccine due: 05/06/2009    Tetanus booster: 04/12/2001: Done.   Tetanus booster due: 04/13/2011    Pneumococcal vaccine: Done.  (08/12/1998)   Pneumococcal vaccine deferral: Not indicated  (05/17/2009)   Pneumococcal vaccine due: None    H. zoster vaccine: Not documented  Colorectal Screening   Hemoccult: Done.  (10/11/2003)   Hemoccult due: Not Indicated    Colonoscopy: 2 tubular adenomas - no high grade dysplasia  (10/07/2008)   Colonoscopy due: 10/07/2013  Other Screening   Pap smear: Done.  (07/12/2004)   Pap smear due: Not Indicated    Mammogram: Assessment: BIRADS 1.   (11/10/2009)   Mammogram action/deferral: Ordered  (10/27/2009)   Mammogram due: 11/2010    DXA bone density scan: normal  (08/16/2008)   DXA scan due: 08/16/2010    Smoking status: never  (12/15/2009)  Lipids   Total Cholesterol: 156  (06/06/2009)   LDL: 77  (06/06/2009)   LDL Direct: Not documented   HDL: 53  (06/06/2009)   Triglycerides: 131  (06/06/2009)    SGOT (AST): 18  (06/06/2009)   SGPT (ALT): 12  (06/06/2009)   Alkaline phosphatase: 77  (06/06/2009)   Total bilirubin: 0.6  (06/06/2009)    Lipid flowsheet reviewed?: Yes   Progress toward LDL goal: At goal  Hypertension   Last Blood Pressure: 149 / 81  (03/14/2010)   Serum creatinine: 0.69  (06/06/2009)   Serum potassium 3.8  (06/06/2009)    Hypertension flowsheet reviewed?: Yes   Progress toward BP goal: Unchanged  Self-Management Support :   Personal Goals (by the next  clinic visit) :      Personal blood pressure goal: 140/90  (05/17/2009)     Personal LDL goal: 130  (05/17/2009)    Hypertension self-management support: Written self-care plan  (05/17/2009)    Lipid self-management support: Written self-care plan  (05/17/2009)

## 2010-09-11 NOTE — Progress Notes (Signed)
  Phone Note Call from Patient   Caller: Patient Summary of Call: Patient called with concern about taking generic Hyzaar. States that she has generalized weakness and sour taste in mouth. Tolerating food/water with no problems. No dizziness or presyncopal s/s. States that she "isn't dying or anything" but would like her physician to write a note saying that she can't tolerate the generic Hyzaar so that she can get the brand name. States that she will be going to [employer] because she wanted to go to a DO.

## 2010-09-11 NOTE — Progress Notes (Signed)
Summary: triage  Phone Note Call from Patient Call back at Home Phone [phone]   Caller: Patient Summary of Call: Has a painful right jaw pain. Also has lost sense of taste. Initial call taken by: Clydell Hakim,  October 26, 2009 11:15 AM  Follow-up for Phone Call        left message Follow-up by: Golden Circle RN,  October 26, 2009 11:36 AM  Additional Follow-up for Phone Call Additional follow up Details #1::        R mandible hurts at tmj when touched. pain is sharp. glands are "a little bit swollen on both sides" lost sensation of taste over past few months. to see pcp tomorrow Additional Follow-up by: Golden Circle RN,  October 26, 2009 11:38 AM    Additional Follow-up for Phone Call Additional follow up Details #2::    noted and agree Follow-up by: Doralee Albino MD,  October 26, 2009 12:16 PM

## 2010-09-11 NOTE — Assessment & Plan Note (Signed)
Summary: costochondritis and heart murmur   Vital Signs:  Patient profile:   77 year old female Height:      64.25 inches Weight:      220.5 pounds BMI:     37.69 Temp:     98.2 degrees F oral Pulse rate:   86 / minute BP sitting:   135 / 72  (left arm) Cuff size:   large  Vitals Entered By: Gladstone Pih (December 07, 2009 3:10 PM) CC: C/O pain under left breast Is Patient Diabetic? No Pain Assessment Patient in pain? yes     Location: breast Intensity: 1 Type: sharp   Primary Care Provider:  Romero Belling MD  CC:  C/O pain under left breast.  History of Present Illness: 77yo F w/ left rib cage pain x 6 days  Left anterior rib cage pain: x 6 days.  Course improving.  Denies any pain with exertion.  No SOB or radiating pain.  No N/V/ or diaphoresis.  No palpitations.  No hx of trauma. No cough or fever associated.  Currently taking ibuprofen 600mg  with some relief.    Habits & Providers  Alcohol-Tobacco-Diet     Tobacco Status: never  Current Medications (verified): 1)  Hyzaar 100-25 Mg  Tabs (Losartan Potassium-Hctz) .Marland Kitchen.. 1 Tab By Mouth Daily. 2)  Lipitor 10 Mg Tabs (Atorvastatin Calcium) .... Take 1 Tablet By Mouth At Bedtime 3)  Prozac 20 Mg  Caps (Fluoxetine Hcl) .... 3 Caps Daily 4)  Prilosec 20 Mg  Cpdr (Omeprazole) .... Take One Tablet Daily 5)  Ventolin Hfa 108 (90 Base) Mcg/act Aers (Albuterol Sulfate) .... Inhale One Puff Every 4-6 Hours As Needed 6)  Zovirax 5 % Oint (Acyclovir) .... Disp 15 Gm Tube.  Apply To Affected Area Three Times A Day During Outbreak 7)  Ibuprofen 800 Mg Tabs (Ibuprofen) .Marland Kitchen.. 1 Tab By Mouth Every 8 Hours Schedule For 2 Days Then As Needed But No Longer Than 10 Days  Allergies (verified): 1)  Amoxicillin (Amoxicillin) 2)  Dilantin (Phenytoin Sodium Extended) 3)  Morphine Sulfate (Morphine Sulfate) 4)  Phenobarbital (Phenobarbital)  Review of Systems      See HPI  Physical Exam  General:  VS Reviewed. Obese, well appearing,  NAD.  Lungs:  Normal respiratory effort, chest expands symmetrically. Lungs are clear to auscultation, no crackles or wheezes.  no pleuritic pain with deep inspiration Heart:  RRR 2/6 SEM best heard LUSB radiating into both carotids; distinct S1 and S2 without delay Msk:  Chest exam: no ecchymosis or deformity pain reproduced with palpation along the anterior rib care along at 5th rib cage underneath L breast    Impression & Recommendations:  Problem # 1:  COSTOCHONDRITIS (ICD-733.6) Assessment New  Hx and exam c/w acute costochondritis that is improving. Not c/w cardiac or pulmonary etiology. Will treat with Ibuprofen 800mg  q8 scheduled x 2 days then as needed for no more than 10 days. Will f/u as needed.  Orders: [facility]- Est  Level 4 (16109)  Problem # 2:  CARDIAC MURMUR (ICD-785.2) Assessment: Unchanged  2/6 SEM best heard at LUSB first noted in 03/2009 but no w/u has been pursued. She is asymptomatic.   Pt d/w Dr. McDiarmid and we both agreed to defer to Dr. Leveda Anna as to whether to pursue an ECHO to evaluate for aortic stenosis. She is to f/u with Dr. Leveda Anna in 1-2 weeks.  Orders: [facility]- Est  Level 4 (60454)  Complete Medication List: 1)  Hyzaar 100-25 Mg Tabs (Losartan  potassium-hctz) .Marland Kitchen.. 1 tab by mouth daily. 2)  Lipitor 10 Mg Tabs (Atorvastatin calcium) .... Take 1 tablet by mouth at bedtime 3)  Prozac 20 Mg Caps (Fluoxetine hcl) .... 3 caps daily 4)  Prilosec 20 Mg Cpdr (Omeprazole) .... Take one tablet daily 5)  Ventolin Hfa 108 (90 Base) Mcg/act Aers (Albuterol sulfate) .... Inhale one puff every 4-6 hours as needed 6)  Zovirax 5 % Oint (Acyclovir) .... Disp 15 gm tube.  apply to affected area three times a day during outbreak 7)  Ibuprofen 800 Mg Tabs (Ibuprofen) .Marland Kitchen.. 1 tab by mouth every 8 hours schedule for 2 days then as needed but no longer than 10 days  Patient Instructions: 1)  Schedule a follow up with Dr. Leveda Anna in the next 1-2 weeks to discuss potential  work up of this new murmur. 2)  Don't take the ibuprofen for more than 10 days.  Remember to take it with food. Prescriptions: IBUPROFEN 800 MG TABS (IBUPROFEN) 1 tab by mouth every 8 hours schedule for 2 days then as needed but no longer than 10 days  #30 x 0   Entered and Authorized by:   Marisue Ivan  MD   Signed by:   Marisue Ivan  MD on 12/07/2009   Method used:   Print then Give to Patient   RxID:   [id number]

## 2010-09-11 NOTE — Assessment & Plan Note (Signed)
Summary: flu shot/kh  Nurse Visit   Vital Signs:  Patient profile:   77 year old female Temp:     98.4 degrees F  Vitals Entered By: Theresia Lo RN (May 25, 2010 11:12 AM)  Allergies: 1)  Amoxicillin (Amoxicillin) 2)  Dilantin (Phenytoin Sodium Extended) 3)  Morphine Sulfate (Morphine Sulfate) 4)  Phenobarbital (Phenobarbital)  Immunizations Administered:  Influenza Vaccine # 1:    Vaccine Type: Fluvax MCR    Site: left deltoid    Mfr: GlaxoSmithKline    Dose: 0.5 ml    Route: IM    Given by: Theresia Lo RN    Exp. Date: 02/06/2011    Lot #: [id number]    VIS given: 03/06/10 version given May 25, 2010.  Flu Vaccine Consent Questions:    Do you have a history of severe allergic reactions to this vaccine? no    Any prior history of allergic reactions to egg and/or gelatin? no    Do you have a sensitivity to the preservative Thimersol? no    Do you have a past history of Guillan-Barre Syndrome? no    Do you currently have an acute febrile illness? no    Have you ever had a severe reaction to latex? no    Vaccine information given and explained to patient? yes    Are you currently pregnant? no  Orders Added: 1)  Influenza Vaccine MCR [00025] 2)  Administration Flu vaccine - MCR [G0008]

## 2010-10-23 ENCOUNTER — Encounter: Payer: Self-pay | Admitting: Home Health Services

## 2010-10-30 ENCOUNTER — Encounter: Payer: Self-pay | Admitting: Home Health Services

## 2010-10-30 ENCOUNTER — Ambulatory Visit (INDEPENDENT_AMBULATORY_CARE_PROVIDER_SITE_OTHER): Payer: Medicare Other | Admitting: Home Health Services

## 2010-10-30 VITALS — BP 139/75 | HR 76 | Temp 98.4°F | Ht 64.0 in | Wt 227.0 lb

## 2010-10-30 DIAGNOSIS — Z0000 Encounter for general adult medical examination without abnormal findings: Secondary | ICD-10-CM

## 2010-10-30 NOTE — Patient Instructions (Signed)
1. Try to eat vegetables 4-5 times a day. 2. Call [employer] to ask about joining the [employer]. Try to work out at least 2 times a week.  3. Recommend scheduling an appointment with an audiologist. 4. Schedule a follow up appointment with Dr. Leveda Anna to talk about depression and ear soreness.  5. Work on losing weight to fit into size 18 this year. 6. Take the time to give yourself enough self-care.

## 2010-10-30 NOTE — Progress Notes (Signed)
  Subjective:    Patient ID: [name], female    DOB: 1931/01/11, 77 y.o.   MRN: [id number]  HPI  Discussed and reviewed with [name].  Agree with her documentation   Review of Systems     Objective:   Physical Exam        Assessment & Plan:

## 2010-10-30 NOTE — Progress Notes (Signed)
Patient here for annual wellness visit, patient reports: Risk Factors/Conditions needing evaluation or treatment: Patient screened positive for depression.  Recommend follow up visit with Dr. Leveda Anna. Diet: Patient is a vegetarian and eat a variety of foods.  She has gained 20 pounds over past year and half.   Physical Activity: Patient has joined senior center but has a hard time motivating her self to go and work out.  Home Safety: Patient lives with her self in her 1 story home.  Patient reports having smoke detectors and adaptive equipment.  End of Life Plan: Patient does not have health care POA.  Gave patient pamphlet to review. Patient identified her friend, Danae Orleans as her emergency contact at [phone]. Other Information: Corrective lens: patient wears corrective lens daily.  Patient had cataract surgery on both eyes.  Dentures: patient has partial on top and sees dentist annually.  Seat Belt: Patient reports wearing seat belt when in vehicle. Wynelle Link Protection: Patient reports wearing sun protection on her face and see a dermatologist annually. Memory: Patient reports no memory loss.   Balance max value patientvalue  Sitting balance 1 1  Arise 2 1  Attempts to arise 2 2  Immediate standing balance 2 2  Standing balance 1 1  Nudge 2 2  Eyes closed 1 1  360 degree turn 1 1  Sitting down 2 2   Gait max value patient value  Initiation of gait 1 1  Step length-left 1 1  Step length-right 1 1  Step height-left 1 1  Step height-right 1 1  Step symmetry 1 1  Step continuity 1 1  Path 2 2  Trunk 2 1  Walking stance 1 0   Balance/Gait Score: 23/26  Mental Status Exam max value patient value  Orientation to time 5 5  Orientation to place 5 5  Registration 3 3  Attention 5 5  Recall 3 3  Language (name 2 objects) 2 2  Language-repeat 1 1  Language-follow 3 step command 3 3  Language-read and follow directions 1 1   Mental Status Exam: 28/28   Annual  Wellness Visit Requirements Recorded Today In  Medical, family, social history Past Medical, Family, Social History Section  Current providers Care team  Current medications Medications  Wt, BP, Ht, BMI Vital signs  Hearing assessment (welcome visit) Hearing/Vision  Tobacco, alcohol, illicit drug use History  ADL Nurse Assessment  Depression Screening Nurse Assessment  Cognitive impairment Nurse Assessment  Fall Risk Nurse Assessment  Home Safety Progress Note  End of Life Planning (welcome visit) Progress Note  Medicare preventative services Progress Note  Risk factors/conditions needing evaluation/treatment Progress Note  Personalized health advice Patient Instructions, goals, letter    Prevention Plan: Patient is going to follow up with her pharmacy about the shingles vaccine.   Recommended Medicare Prevention Screenings Women over 34 Test For Frequency Date of Last- BOLD if needed  Breast Cancer 1-2 yrs 11/2009  Cervical Cancer 1-3 yrs 07/2004  Colorectal Cancer 1-10 yrs 20/2010  Osteoporosis once 08/2008  Cholesterol 5 yrs 05/2009  Diabetes yearly Non diabetic  HIV yearly declined  Influenza Shot yearly 05/2010  Pneumonia Shot once 08/1998  Zostavax Shot once [employer].

## 2010-11-14 ENCOUNTER — Encounter: Payer: Self-pay | Admitting: Family Medicine

## 2010-11-14 ENCOUNTER — Ambulatory Visit (INDEPENDENT_AMBULATORY_CARE_PROVIDER_SITE_OTHER): Payer: Medicare Other | Admitting: Family Medicine

## 2010-11-14 DIAGNOSIS — F319 Bipolar disorder, unspecified: Secondary | ICD-10-CM

## 2010-11-14 DIAGNOSIS — R531 Weakness: Secondary | ICD-10-CM | POA: Insufficient documentation

## 2010-11-14 DIAGNOSIS — R5381 Other malaise: Secondary | ICD-10-CM

## 2010-11-14 DIAGNOSIS — E785 Hyperlipidemia, unspecified: Secondary | ICD-10-CM

## 2010-11-14 LAB — LIPID PANEL
Cholesterol: 148 mg/dL (ref 0–200)
HDL: 43 mg/dL (ref >39)
Triglycerides: 117 mg/dL (ref <150)

## 2010-11-14 LAB — CBC
MCH: 30.9 pg (ref 26.0–34.0)
MCHC: 34.3 g/dL (ref 30.0–36.0)
MCV: 90.2 fL (ref 78.0–100.0)
RBC: 4.37 MIL/uL (ref 3.87–5.11)
RDW: 13.4 % (ref 11.5–15.5)
WBC: 7.5 10*3/uL (ref 4.0–10.5)

## 2010-11-14 MED ORDER — METHYLPHENIDATE HCL ER (OSM) 18 MG PO TBCR: 18.0000 mg | EXTENDED_RELEASE_TABLET | Freq: Every day | ORAL | Status: DC

## 2010-11-14 NOTE — Patient Instructions (Signed)
Do not fill the prescription until I call with blood work results. This new medicine is an ADD medication.  I want you to call me next week and tell me how it is working.  Cut back on your caffeine when you start the new medication.  See me in one month and we will decide whether to continue this medication or not.

## 2010-11-15 ENCOUNTER — Encounter: Payer: Self-pay | Admitting: Family Medicine

## 2010-11-15 LAB — COMPLETE METABOLIC PANEL WITH GFR
ALT: 19 U/L (ref 0–35)
Chloride: 104 mEq/L (ref 96–112)
Creat: 0.83 mg/dL (ref 0.40–1.20)
GFR, Est African American: >60 mL/min (ref >60)
Glucose, Bld: 89 mg/dL (ref 70–99)
Total Protein: 7.1 g/dL (ref 6.0–8.3)

## 2010-11-15 NOTE — Assessment & Plan Note (Signed)
Due to recheck labs. 

## 2010-11-15 NOTE — Progress Notes (Signed)
  Subjective:    Patient ID: [name], female    DOB: 02/09/1931, 77 y.o.   MRN: [id number]  HPI Positive depression screen on annual visit.  Longstanding bipolar.  Prozac has been her best med and she does not want to alter dose.  Does not get out much but is making sure her life does not get small.  Staying connected.   Has previously been diagnosed with adult ADD.  Never meds.  Loves reading but now can't seem to concentrate.  Easily distracted.  Coffee/caffeine seems to be a great med for her.  "If I could just drink coffee all day long without messing up my sleep, I would be fine. No SI or HI    Review of Systems     Objective:   Physical Exam Lungs clear, Cardiac RRR without m or g       Assessment & Plan:

## 2010-11-15 NOTE — Assessment & Plan Note (Signed)
With ADD componant.  If labs OK, will give trial of low dose concerta.

## 2010-11-15 NOTE — Assessment & Plan Note (Signed)
Feel weakness is likely due to depression, but will check labs for potential other causes.

## 2010-11-17 LAB — CBC
HCT: 35.7 % — ABNORMAL LOW (ref 36.0–46.0)
HCT: 36.1 % (ref 36.0–46.0)
Hemoglobin: 12.4 g/dL (ref 12.0–15.0)
MCHC: 34.4 g/dL (ref 30.0–36.0)
MCV: 89.6 fL (ref 78.0–100.0)
Platelets: 243 10*3/uL (ref 150–400)
RDW: 14.3 % (ref 11.5–15.5)

## 2010-11-17 LAB — GLUCOSE, CAPILLARY
Glucose-Capillary: 105 mg/dL — ABNORMAL HIGH (ref 70–99)
Glucose-Capillary: 108 mg/dL — ABNORMAL HIGH (ref 70–99)
Glucose-Capillary: 112 mg/dL — ABNORMAL HIGH (ref 70–99)
Glucose-Capillary: 115 mg/dL — ABNORMAL HIGH (ref 70–99)
Glucose-Capillary: 95 mg/dL (ref 70–99)

## 2010-11-17 LAB — COMPREHENSIVE METABOLIC PANEL
ALT: 54 U/L — ABNORMAL HIGH (ref 0–35)
Alkaline Phosphatase: 120 U/L — ABNORMAL HIGH (ref 39–117)
CO2: 25 mEq/L (ref 19–32)
Calcium: 8.8 mg/dL (ref 8.4–10.5)
GFR calc non Af Amer: >60 mL/min (ref >60)
Glucose, Bld: 112 mg/dL — ABNORMAL HIGH (ref 70–99)
Potassium: 4 mEq/L (ref 3.5–5.1)
Sodium: 132 mEq/L — ABNORMAL LOW (ref 135–145)

## 2010-11-17 LAB — BASIC METABOLIC PANEL
BUN: 17 mg/dL (ref 6–23)
CO2: 26 mEq/L (ref 19–32)
GFR calc Af Amer: >60 mL/min (ref >60)
Glucose, Bld: 105 mg/dL — ABNORMAL HIGH (ref 70–99)
Potassium: 3.9 mEq/L (ref 3.5–5.1)
Sodium: 129 mEq/L — ABNORMAL LOW (ref 135–145)

## 2010-11-17 LAB — MAGNESIUM: Magnesium: 2.1 mg/dL (ref 1.5–2.5)

## 2010-11-17 LAB — DIFFERENTIAL
Basophils Absolute: 0 10*3/uL (ref 0.0–0.1)
Basophils Relative: 0 % (ref 0–1)
Eosinophils Absolute: 0.3 10*3/uL (ref 0.0–0.7)
Neutrophils Relative %: 80 % — ABNORMAL HIGH (ref 43–77)

## 2010-11-17 LAB — PREALBUMIN: Prealbumin: 14.7 mg/dL — ABNORMAL LOW (ref 18.0–45.0)

## 2010-11-18 LAB — BASIC METABOLIC PANEL
BUN: 18 mg/dL (ref 6–23)
BUN: 19 mg/dL (ref 6–23)
BUN: 21 mg/dL (ref 6–23)
CO2: 26 mEq/L (ref 19–32)
CO2: 27 mEq/L (ref 19–32)
CO2: 27 mEq/L (ref 19–32)
Calcium: 8.5 mg/dL (ref 8.4–10.5)
Calcium: 8.6 mg/dL (ref 8.4–10.5)
Calcium: 9 mg/dL (ref 8.4–10.5)
Chloride: 106 mEq/L (ref 96–112)
Creatinine, Ser: 0.53 mg/dL (ref 0.4–1.2)
Creatinine, Ser: 0.65 mg/dL (ref 0.4–1.2)
GFR calc Af Amer: >60 mL/min (ref >60)
GFR calc Af Amer: >60 mL/min (ref >60)
GFR calc Af Amer: >60 mL/min (ref >60)
GFR calc non Af Amer: >60 mL/min (ref >60)
GFR calc non Af Amer: >60 mL/min (ref >60)
GFR calc non Af Amer: >60 mL/min (ref >60)
GFR calc non Af Amer: >60 mL/min (ref >60)
Glucose, Bld: 114 mg/dL — ABNORMAL HIGH (ref 70–99)
Glucose, Bld: 127 mg/dL — ABNORMAL HIGH (ref 70–99)
Potassium: 4 mEq/L (ref 3.5–5.1)
Potassium: 4 mEq/L (ref 3.5–5.1)
Sodium: 139 mEq/L (ref 135–145)
Sodium: 139 mEq/L (ref 135–145)

## 2010-11-18 LAB — COMPREHENSIVE METABOLIC PANEL
ALT: 20 U/L (ref 0–35)
ALT: 20 U/L (ref 0–35)
ALT: 69 U/L — ABNORMAL HIGH (ref 0–35)
AST: 17 U/L (ref 0–37)
AST: 42 U/L — ABNORMAL HIGH (ref 0–37)
Albumin: 3.3 g/dL — ABNORMAL LOW (ref 3.5–5.2)
Albumin: 4.7 g/dL (ref 3.5–5.2)
Alkaline Phosphatase: 54 U/L (ref 39–117)
BUN: 16 mg/dL (ref 6–23)
CO2: 21 mEq/L (ref 19–32)
CO2: 27 mEq/L (ref 19–32)
CO2: 30 mEq/L (ref 19–32)
Calcium: 8.8 mg/dL (ref 8.4–10.5)
Calcium: 8.9 mg/dL (ref 8.4–10.5)
Calcium: 9.9 mg/dL (ref 8.4–10.5)
Chloride: 103 mEq/L (ref 96–112)
Creatinine, Ser: 0.55 mg/dL (ref 0.4–1.2)
Creatinine, Ser: 0.77 mg/dL (ref 0.4–1.2)
GFR calc Af Amer: >60 mL/min (ref >60)
GFR calc Af Amer: >60 mL/min (ref >60)
GFR calc Af Amer: >60 mL/min (ref >60)
GFR calc non Af Amer: >60 mL/min (ref >60)
GFR calc non Af Amer: >60 mL/min (ref >60)
Glucose, Bld: 102 mg/dL — ABNORMAL HIGH (ref 70–99)
Glucose, Bld: 115 mg/dL — ABNORMAL HIGH (ref 70–99)
Potassium: 3.3 mEq/L — ABNORMAL LOW (ref 3.5–5.1)
Potassium: 3.9 mEq/L (ref 3.5–5.1)
Sodium: 139 mEq/L (ref 135–145)
Sodium: 140 mEq/L (ref 135–145)
Total Bilirubin: 0.8 mg/dL (ref 0.3–1.2)
Total Protein: 5.7 g/dL — ABNORMAL LOW (ref 6.0–8.3)
Total Protein: 5.9 g/dL — ABNORMAL LOW (ref 6.0–8.3)

## 2010-11-18 LAB — CBC
HCT: 31 % — ABNORMAL LOW (ref 36.0–46.0)
HCT: 32.4 % — ABNORMAL LOW (ref 36.0–46.0)
HCT: 34.9 % — ABNORMAL LOW (ref 36.0–46.0)
Hemoglobin: 10.6 g/dL — ABNORMAL LOW (ref 12.0–15.0)
Hemoglobin: 12.5 g/dL (ref 12.0–15.0)
Hemoglobin: 12.6 g/dL (ref 12.0–15.0)
Hemoglobin: 13.3 g/dL (ref 12.0–15.0)
MCHC: 34 g/dL (ref 30.0–36.0)
MCHC: 34.1 g/dL (ref 30.0–36.0)
MCHC: 34.4 g/dL (ref 30.0–36.0)
MCHC: 34.5 g/dL (ref 30.0–36.0)
MCV: 88.5 fL (ref 78.0–100.0)
MCV: 89.6 fL (ref 78.0–100.0)
MCV: 90.5 fL (ref 78.0–100.0)
Platelets: 154 10*3/uL (ref 150–400)
Platelets: 157 10*3/uL (ref 150–400)
Platelets: 170 10*3/uL (ref 150–400)
Platelets: 180 10*3/uL (ref 150–400)
Platelets: 202 10*3/uL (ref 150–400)
Platelets: 233 10*3/uL (ref 150–400)
RBC: 3.31 MIL/uL — ABNORMAL LOW (ref 3.87–5.11)
RBC: 3.62 MIL/uL — ABNORMAL LOW (ref 3.87–5.11)
RBC: 4.04 MIL/uL (ref 3.87–5.11)
RBC: 4.05 MIL/uL (ref 3.87–5.11)
RBC: 4.08 MIL/uL (ref 3.87–5.11)
RBC: 4.3 MIL/uL (ref 3.87–5.11)
RBC: 4.47 MIL/uL (ref 3.87–5.11)
RDW: 13.9 % (ref 11.5–15.5)
RDW: 14.1 % (ref 11.5–15.5)
RDW: 14.3 % (ref 11.5–15.5)
WBC: 10.3 10*3/uL (ref 4.0–10.5)
WBC: 10.6 10*3/uL — ABNORMAL HIGH (ref 4.0–10.5)
WBC: 15.3 10*3/uL — ABNORMAL HIGH (ref 4.0–10.5)
WBC: 6.3 10*3/uL (ref 4.0–10.5)
WBC: 7.8 10*3/uL (ref 4.0–10.5)
WBC: 8.8 10*3/uL (ref 4.0–10.5)

## 2010-11-18 LAB — DIFFERENTIAL
Basophils Relative: 1 % (ref 0–1)
Eosinophils Absolute: 0.1 10*3/uL (ref 0.0–0.7)
Eosinophils Relative: 0 % (ref 0–5)
Eosinophils Relative: 2 % (ref 0–5)
Lymphocytes Relative: 7 % — ABNORMAL LOW (ref 12–46)
Lymphs Abs: 0.8 10*3/uL (ref 0.7–4.0)
Monocytes Relative: 13 % — ABNORMAL HIGH (ref 3–12)
Neutro Abs: 9.8 10*3/uL — ABNORMAL HIGH (ref 1.7–7.7)
Neutrophils Relative %: 69 % (ref 43–77)

## 2010-11-18 LAB — GLUCOSE, CAPILLARY
Glucose-Capillary: 108 mg/dL — ABNORMAL HIGH (ref 70–99)
Glucose-Capillary: 110 mg/dL — ABNORMAL HIGH (ref 70–99)
Glucose-Capillary: 112 mg/dL — ABNORMAL HIGH (ref 70–99)
Glucose-Capillary: 116 mg/dL — ABNORMAL HIGH (ref 70–99)
Glucose-Capillary: 118 mg/dL — ABNORMAL HIGH (ref 70–99)
Glucose-Capillary: 118 mg/dL — ABNORMAL HIGH (ref 70–99)
Glucose-Capillary: 118 mg/dL — ABNORMAL HIGH (ref 70–99)
Glucose-Capillary: 118 mg/dL — ABNORMAL HIGH (ref 70–99)
Glucose-Capillary: 121 mg/dL — ABNORMAL HIGH (ref 70–99)
Glucose-Capillary: 123 mg/dL — ABNORMAL HIGH (ref 70–99)
Glucose-Capillary: 123 mg/dL — ABNORMAL HIGH (ref 70–99)
Glucose-Capillary: 124 mg/dL — ABNORMAL HIGH (ref 70–99)
Glucose-Capillary: 126 mg/dL — ABNORMAL HIGH (ref 70–99)
Glucose-Capillary: 126 mg/dL — ABNORMAL HIGH (ref 70–99)
Glucose-Capillary: 127 mg/dL — ABNORMAL HIGH (ref 70–99)
Glucose-Capillary: 127 mg/dL — ABNORMAL HIGH (ref 70–99)
Glucose-Capillary: 127 mg/dL — ABNORMAL HIGH (ref 70–99)
Glucose-Capillary: 128 mg/dL — ABNORMAL HIGH (ref 70–99)
Glucose-Capillary: 128 mg/dL — ABNORMAL HIGH (ref 70–99)
Glucose-Capillary: 129 mg/dL — ABNORMAL HIGH (ref 70–99)
Glucose-Capillary: 130 mg/dL — ABNORMAL HIGH (ref 70–99)
Glucose-Capillary: 130 mg/dL — ABNORMAL HIGH (ref 70–99)
Glucose-Capillary: 131 mg/dL — ABNORMAL HIGH (ref 70–99)
Glucose-Capillary: 132 mg/dL — ABNORMAL HIGH (ref 70–99)
Glucose-Capillary: 132 mg/dL — ABNORMAL HIGH (ref 70–99)
Glucose-Capillary: 133 mg/dL — ABNORMAL HIGH (ref 70–99)
Glucose-Capillary: 136 mg/dL — ABNORMAL HIGH (ref 70–99)
Glucose-Capillary: 149 mg/dL — ABNORMAL HIGH (ref 70–99)
Glucose-Capillary: 152 mg/dL — ABNORMAL HIGH (ref 70–99)
Glucose-Capillary: 160 mg/dL — ABNORMAL HIGH (ref 70–99)
Glucose-Capillary: 234 mg/dL — ABNORMAL HIGH (ref 70–99)

## 2010-11-18 LAB — CREATININE, SERUM
Creatinine, Ser: 0.58 mg/dL (ref 0.4–1.2)
GFR calc Af Amer: >60 mL/min (ref >60)
GFR calc non Af Amer: >60 mL/min (ref >60)

## 2010-11-18 LAB — HEMOGLOBIN A1C
Hgb A1c MFr Bld: 5.7 % (ref 4.6–6.1)
Mean Plasma Glucose: 111 mg/dL
Mean Plasma Glucose: 117 mg/dL

## 2010-11-18 LAB — URINALYSIS, ROUTINE W REFLEX MICROSCOPIC
Leukocytes, UA: NEGATIVE
Nitrite: NEGATIVE
Protein, ur: >300 mg/dL — AB
Specific Gravity, Urine: 1.015 (ref 1.005–1.030)
Urobilinogen, UA: 0.2 mg/dL (ref 0.0–1.0)

## 2010-11-18 LAB — PHOSPHORUS: Phosphorus: 3.8 mg/dL (ref 2.3–4.6)

## 2010-11-18 LAB — TRIGLYCERIDES: Triglycerides: 164 mg/dL — ABNORMAL HIGH (ref <150)

## 2010-11-18 LAB — MAGNESIUM: Magnesium: 2.1 mg/dL (ref 1.5–2.5)

## 2010-11-18 LAB — POTASSIUM: Potassium: 4 mEq/L (ref 3.5–5.1)

## 2010-11-18 LAB — GASTRIC OCCULT BLOOD (1-CARD TO LAB): Occult Blood, Gastric: POSITIVE — AB

## 2010-11-18 LAB — AMYLASE: Amylase: 64 U/L (ref 27–131)

## 2010-11-18 LAB — LACTIC ACID, PLASMA: Lactic Acid, Venous: 4 mmol/L — ABNORMAL HIGH (ref 0.5–2.2)

## 2010-11-18 LAB — URINE MICROSCOPIC-ADD ON

## 2010-11-18 LAB — URINE CULTURE: Colony Count: NO GROWTH

## 2010-11-18 LAB — TSH: TSH: 1.365 u[IU]/mL (ref 0.350–4.500)

## 2010-11-18 LAB — PREALBUMIN
Prealbumin: 10.2 mg/dL — ABNORMAL LOW (ref 18.0–45.0)
Prealbumin: 12.7 mg/dL — ABNORMAL LOW (ref 18.0–45.0)

## 2010-11-18 LAB — LIPASE, BLOOD: Lipase: 35 U/L (ref 11–59)

## 2010-11-22 ENCOUNTER — Telehealth: Payer: Self-pay | Admitting: Family Medicine

## 2010-11-22 DIAGNOSIS — F319 Bipolar disorder, unspecified: Secondary | ICD-10-CM

## 2010-11-22 NOTE — Telephone Encounter (Signed)
Pt was prescribed Concerta which she discovered from pharmacy her insurance does not cover.  Cannot pay out of pocket.  Requesting rx for Adderall instead.  Please call when rx has been written.

## 2010-11-22 NOTE — Telephone Encounter (Signed)
States that her Concerta cost to much and needs Adderral for her insurance to pay Roxine Caddy  Please let pt know when this is called in.

## 2010-11-23 MED ORDER — AMPHETAMINE-DEXTROAMPHET ER 10 MG PO CP24: 10.0000 mg | ORAL_CAPSULE | Freq: Every day | ORAL | Status: DC

## 2010-11-23 NOTE — Assessment & Plan Note (Signed)
Will switch from concerta to adderal due to cost.

## 2010-11-26 ENCOUNTER — Other Ambulatory Visit: Payer: Self-pay | Admitting: Family Medicine

## 2010-11-26 ENCOUNTER — Telehealth: Payer: Self-pay | Admitting: Family Medicine

## 2010-11-26 DIAGNOSIS — F319 Bipolar disorder, unspecified: Secondary | ICD-10-CM

## 2010-11-26 MED ORDER — AMPHETAMINE-DEXTROAMPHET ER 10 MG PO CP24: 10.0000 mg | ORAL_CAPSULE | Freq: Every day | ORAL | Status: DC

## 2010-11-26 NOTE — Progress Notes (Signed)
Pt saw Dennison Nancy at [facility].  Stated she could not afford the recently prescribed Concerta medication.  She asked for Adderral.  This medication was on her medication list.  An Rx for Concerta XR 10 mg daily Disp#30, RF:0 was given to patient.

## 2010-11-26 NOTE — Telephone Encounter (Signed)
Pt's insurance still will not pay for rx cause it's for the extended release Adderall.  She spoke with her pharmacist and he said she need the immedidate release form.  Please contact her about this.  She is quite upset that she still hasn't gotten her meds.

## 2010-11-26 NOTE — Telephone Encounter (Signed)
Will route to Dr. Hensel 

## 2010-11-27 MED ORDER — AMPHETAMINE-DEXTROAMPHETAMINE 5 MG PO TABS: 5.0000 mg | ORAL_TABLET | Freq: Every day | ORAL | Status: DC

## 2010-12-13 ENCOUNTER — Other Ambulatory Visit: Payer: Self-pay | Admitting: Family Medicine

## 2010-12-13 NOTE — Telephone Encounter (Signed)
Refill request

## 2010-12-18 ENCOUNTER — Other Ambulatory Visit: Payer: Self-pay | Admitting: Family Medicine

## 2010-12-18 MED ORDER — AMPHETAMINE-DEXTROAMPHETAMINE 5 MG PO TABS: 5.0000 mg | ORAL_TABLET | Freq: Every day | ORAL | Status: DC

## 2010-12-18 NOTE — Telephone Encounter (Signed)
Wanted to let Dr Leveda Anna know that she is taking 2 pills of Adderall every other day in the morning and it seems to work well that way.  Needs a refill - please call when ready.

## 2010-12-18 NOTE — Telephone Encounter (Signed)
Called and she is very please with the medication.  Spirits better.  For the first time in years she is able to sit and read a book, not bothered by distractions.  No side effects.  She has taken BP and it is not elevated.  She is actually taking 10 mg (2 tabs) every other day.  I am OK with this as long as she does not exceed 30 pills per month.  Handwritten Rx for 3 month supply.

## 2010-12-25 NOTE — Consult Note (Signed)
[name], BERRETT NO.:  [id number]   MEDICAL RECORD NO.:  [id number]          PATIENT TYPE:  INP   LOCATION:  5120                         FACILITY:  [facility]   PHYSICIAN:  Graylin Shiver, M.D.   DATE OF BIRTH:  [birth year]   DATE OF CONSULTATION:  03/02/2009  DATE OF DISCHARGE:                                 CONSULTATION   REASON FOR CONSULTATION:  The patient is a 77 year old female who  presented to the emergency room with complaints of upper abdominal pain  which started yesterday.  A CT scan showed a large hiatal hernia with  marked gastric distention.  The distal stomach and gastroduodenal  junction lie at the diaphragmatic hiatus with the duodenum and small  bowel decompressed.  These findings are suspicious for gastric outlet  obstruction.  I discussed the case with the surgeons.  The surgeons are  seeing her in consultation and we will proceed together to further  evaluate the problem.   PAST HISTORY:  1. Hypertension.  2. Bipolar disorder.  3. History of brain cancer.  4. Epilepsy.  5. Hyperlipidemia.  6. GERD.   SURGERIES:  Hysterectomy.   ALLERGIES:  DILANTIN, PENICILLIN, PHENOBARBITAL.   MEDICATIONS:  Ativan, fluoxetine, Lipitor, hydrochlorothiazide,  nitroglycerin, and omeprazole.   SOCIAL HISTORY:  Does not smoke.   REVIEW OF SYSTEMS:  Not complaining of chest pain or shortness of  breath.   PHYSICAL EXAMINATION:  VITAL SIGNS:  Stable.  GENERAL:  She is in no distress, nonicteric.  HEART:  Regular rhythm.  No murmurs.  LUNGS:  Clear.  ABDOMEN:  Bowel sounds are present.  It is soft.  There is some  discomfort in the upper abdomen.  There is no rebound.   IMPRESSION:  Large hiatal hernia with suspicion of obstruction.   PLAN:  I discussed the case with surgery.  We will proceed with an EGD  tomorrow to determine if there is anything else causing an obstruction.  The patient may need to have the hiatal hernia surgically repaired  in  order to alleviate the obstructive symptoms.           ______________________________  Graylin Shiver, M.D.     SFG/MEDQ  D:  03/02/2009  T:  03/03/2009  Job:  [id number]

## 2010-12-25 NOTE — Op Note (Signed)
NAMEEARLISHA, [name]              ACCOUNT NO.:  [id number]   MEDICAL RECORD NO.:  [id number]          PATIENT TYPE:  INP   LOCATION:  1607                         FACILITY:  [facility]   PHYSICIAN:  Ollen Gross, M.D.    DATE OF BIRTH:  11/06/1930   DATE OF PROCEDURE:  09/09/2007  DATE OF DISCHARGE:                               OPERATIVE REPORT   PREOPERATIVE DIAGNOSIS:  Osteoarthritis, left knee.   POSTOPERATIVE DIAGNOSIS:  Osteoarthritis, left knee.   PROCEDURE:  Left total knee arthroplasty.   SURGEON:  Ollen Gross, MD   ASSISTANT:  Avel Peace, PA-C   ANESTHESIA:  Spinal.   ESTIMATED BLOOD LOSS:  Minimal.   DRAINS:  None.   TOURNIQUET TIME:  Thirty-one minutes at 300 mmHg.   COMPLICATIONS:  None.   CONDITION:  Stable, to Recovery.   BRIEF CLINICAL NOTE:  [name] is a 77 year old female with end-stage  arthritis of the left knee with progressively worsening pain and  dysfunction.  She has had a successful right total knee and presents now  for left total knee arthroplasty.   PROCEDURE IN DETAIL:  After the successful administration of a spinal  anesthetic, a tourniquet was placed high on the left thigh and left  lower extremity prepped and draped in the usual sterile fashion.  Extremity was wrapped in an Esmarch, knee flexed and tourniquet inflated  to 300 mmHg.  A midline incision made with a 10 blade through  subcutaneous tissue to the level of the extensor mechanism.  A fresh  blade was used to make a medial parapatellar arthrotomy.  Soft tissue  over the proximal medial tibia was subperiosteally elevated to the joint  line with a knife and into the semimembranosus bursa with a Cobb  elevator.  Soft tissue laterally was elevated with attention being paid  to avoiding the patellar tendon on the tibial tubercle.  Patella was  subluxed laterally and knee flexed to 90 degrees; ACL and PCL were  removed.  Drill was used to create a starting hole in the distal femur  and the canal was thoroughly irrigated.  A 5-degree left valgus  alignment guide was placed and referencing off the posterior condyles,  rotation is marked and a block pinned to remove 10 mm off the distal  femur.  Distal femoral resection was made with an oscillating saw.  Sizing block was placed; a size 2.5 was the most appropriate.  Rotation  was marked off the epicondylar axis.  A size 2.5 cutting block was  placed and the anterior, posterior and chamfer cuts made.   The tibia was subluxed forward and menisci removed.  Extramedullary  tibial alignment guide was placed, referencing proximally to the medial  aspect of the tibial tubercle and distally along the 2nd metatarsal axis  and tibial crest.  Block was pinned to remove 10 mm off the non-  deficient lateral side.  Tibial resection was made with an oscillating  saw.  A size 3 was the most appropriate tibial size and the proximal  tibia was prepared to a modular drill and keel punch for the size 3.  Femoral preparation was completed with the intercondylar cut for a size  2.5.   Size 3 mobile bearing tibial trial, a size 2.5 posterior-stabilized  femoral trial and a 10-mm posterior-stabilized rotating platform insert  trial were placed.  With the 10, full extension was achieved with  excellent varus and valgus balance throughout full range of motion.  The  patella was everted and thickness measured to be 23 mm.  Freehand  resection was taken to 14 mm.  A 38 templates was placed, lug holes were  drilled, trial patella was placed and it tracked normally.  Osteophytes  were removed off the posterior femur with the trial in place.  All  trials were removed and the cut bone surfaces prepared with pulsatile  lavage.  Cements was mixed and once ready for implantation, the size 3  mobile bearing tibial tray, size 2.5 posterior-stabilized femur and 38  patella were cemented into place and patella is held with a clamp.  Trial 10-mm inserts  are placed and the knee held in full extension and  all extruded cement removed.  When the cement was fully hardened, then  the trial was removed and the wound copiously irrigated with saline  solution.  FloSeal was injected on the posterior capsule and then the  permanent 10-mm posterior-stabilized rotating platform insert was  placed.  The FloSeal was injected in the medial and lateral gutters and  suprapatellar area.  A moist sponge was placed and tourniquet released  for a total time of 31 minutes.  Sponge was held for 2 minutes and then  removed.  Minimal bleeding was encountered.  The bleeding that was  encountered was stopped with electrocautery.  The extensor mechanism was  then closed with interrupted #1 PDS.  Flexion against gravity was 135  degrees.  The subcu was closed with interrupted undyed 2-0 Vicryl and  subcuticular with running 4-0 Monocryl.  The incision was cleaned and  dried and Steri-Strips and a bulky sterile dressing applied.  She was  then placed into a knee immobilizer, awakened and transported to  Recovery in stable condition.      Ollen Gross, M.D.  Electronically Signed     FA/MEDQ  D:  09/11/2007  T:  09/12/2007  Job:  [id number]
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
## 2010-12-25 NOTE — Consult Note (Signed)
NAMETRENITY, PHA NO.:  [id number]   MEDICAL RECORD NO.:  [id number]          PATIENT TYPE:  INP   LOCATION:  5120                         FACILITY:  [facility]   PHYSICIAN:  Velora Heckler, MD      DATE OF BIRTH:  [birth year]   DATE OF CONSULTATION:  03/02/2009  DATE OF DISCHARGE:                                 CONSULTATION   PRIMARY CARE PHYSICIAN:  [facility].   REASON FOR CONSULTATION:  Possible gastric outlet obstruction.   HISTORY OF PRESENT ILLNESS:  [name] is a 77 year old white female  with a history of bipolar disorder, meningioma, which was removed,  epilepsy, gastroesophageal reflux disease, hyperlipidemia, and  hypertension who began having nausea and vomiting with some associated  abdominal pain yesterday around 5 o'clock in the afternoon.  Currently,  the patient is very sedated and is unable to provide much history.  Her  neighbor who is in the room states that she has had one similar episodes  like this, time is unknown.  Otherwise, she does have a chronic large  hiatal hernia, which she has known about since 35s or 1960s.  The  patient denies any fevers or chills, but is otherwise passing flatus and  had a normal bowel movement yesterday.  The patient was brought to the  emergency department this morning due to her nausea and vomiting where a  CT scan of her abdomen and pelvis was completed, which showed a large  hiatal hernia with marked distention of the stomach with distal stomach  and gastroduodenal junction lying at the diaphragmatic hiatus with the  duodenum and small bowel decompressed suspicious for gastric outlet  obstruction at the hernia site.  Because of this, we were asked to see  the patient in consultation.   REVIEW OF SYSTEMS:  Please see HPI.  Otherwise, all other systems are  negative.   PAST MEDICAL HISTORY:  1. Bipolar disorder.  2. Brain meningioma.  3. History of epilepsy  4. GERD.  5. Large  hiatal hernia.  6. Hyperlipidemia.  7. Hypertension.   PAST SURGICAL HISTORY:  1. Resection of brain meningioma.  2. Right oophorectomy/appendectomy.  3. Breast reduction.   SOCIAL HISTORY:  The patient is single.  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  She does not drink any alcohol and is a  nonsmoker.   ALLERGIES:  1. DILANTIN.  2. PENICILLIN.  3. PHENOBARBITAL.   MEDICATIONS:  1. Fluoxetine 60 mg daily.  2. Lipitor 10 mg daily.  3. Losartan/hydrochlorothiazide 100/25 mg once daily.  4. Omeprazole 40 mg daily.  5. Ativan, dose unknown.  6. Nitroglycerin, dose unknown.   PHYSICAL EXAMINATION:  GENERAL:  [name] is a 77 year old white  female who is currently lying in bed, very sedated and sleepy, but  otherwise in no acute distress.  VITAL SIGNS:  Temperature 97.8, respirations 18, pulse 94, blood  pressure 166/67.  HEENT:  Head is normocephalic, atraumatic.  Sclerae noninjected.  Pupils  are equal, round, and reactive to light.  Ears and nose without any  obvious masses or lesions; however, she does have a nose piercing in her  right naris.  No rhinorrhea.  Mouth is pink and moist.  Throat shows no  exudate.  NECK:  Supple.  Trachea is midline.  No thyromegaly.  HEART:  Regular rate and rhythm.  Normal S1 and S2.  No murmurs,  gallops, or rubs are noted.  +2 carotid, radial, and pedal pulses  bilaterally.  LUNGS:  Clear to auscultation bilaterally with no wheezes, rhonchi, or  rales noted.  Respiratory effort is nonlabored.  ABDOMEN:  Soft, nontender to palpation, nondistended with active bowel  sounds.  She does not have any palpable masses or hernias and no  peritoneal signs.  MUSCULOSKELETAL:  Four extremities are symmetrical.  No cyanosis,  clubbing, or edema.  NEURO:  Cranial nerves II through XII appear to be grossly intact.  PSYCH:  The patient is alert and oriented x3 with an appropriate affect.   LABS AND DIAGNOSTICS:  White  blood cell count 11,100, hemoglobin 15.8,  hematocrit 45.8, platelet count is 233,000, neutrophil count is 89%.  Sodium 138, potassium 3.1, glucose 225, BUN 16, creatinine 0.77.  CT of  the abdomen and pelvis revealed a large hiatal hernia with distal  stomach and gastroduodenal junction lying at the diaphragmatic hiatus  with the duodenum and small bowel loops decompressed suspicious for  gastric outlet obstruction.   IMPRESSION:  1. Chronic large hiatal hernia repair.  2. Questionable esophageal hernia/questionable obstruction secondary      to hernia on CT scan.  3. Hypertension.  4. Hyperlipidemia.  5. History of bipolar disorder.  6. History of epilepsy.   PLAN:  At this time, it seems unlikely that the patient has an acute  gastric outlet obstruction secondary to a history of only about 1-day  history of nausea and vomiting.  Therefore at this time we would  recommend a medicine admission with a gastroenterology consultation to  further work the CT findings up.  Because of the patient's large  hiatal hernia and the possibility of an esophageal hernia or gastric  volvulus, the patient may at some point need surgical intervention, but  need this further worked up before that can be determined.  We will  follow the patient while here in the hospital.      Letha Cape, PA      Velora Heckler, MD  Electronically Signed    KEO/MEDQ  D:  03/02/2009  T:  03/03/2009  Job:  [id number]   cc:   [facility]

## 2010-12-25 NOTE — Discharge Summary (Signed)
[name], OSORTO              ACCOUNT NO.:  [id number]   MEDICAL RECORD NO.:  [id number]          PATIENT TYPE:  INP   LOCATION:  5149                         FACILITY:  [facility]   PHYSICIAN:  Wayne A. Sheffield Slider, M.D.    DATE OF BIRTH:  1931/05/24   DATE OF ADMISSION:  03/02/2009  DATE OF DISCHARGE:  03/14/2009                               DISCHARGE SUMMARY   PRIMARY CARE PHYSICIAN:  Dr. Constance Goltz at the [facility].   DISCHARGE DIAGNOSES:  1. Hiatal hernia with incarcerated stomach and gastric outlet      obstruction.  2. Volvulus stomach.  3. Hypertension.  4. Depression.  5. Gastroesophageal reflux disease.   DISCHARGE MEDICATIONS:  1. Oxycodone Elixir 5/50, 50 mL every 4 hours as needed for pain.  2. Phenergan 50 mg pills and suppositories as needed every 6 hours for      nausea and vomiting.  3. Fluoxetine 60 mg once a day p.o.  4. Lipitor 10 mg once a day p.o.  5. Valsartan/hydrochlorothiazide 100/25 mg 1 p.o. daily.  6. Omeprazole 40 mg 1 p.o. daily.  7. Colace 5 mg p.o. b.i.d. p.r.n. constipation.   CONSULTANTS:  1. [facility] for the initial diagnostic and therapeutic      laparoscopic repair of the hiatal hernia.  2. Gastroenterology, Dr. Graylin Shiver, date of consultation was March 02, 2009.  3. Dr Jeanie Sewer psych date of consultation July 28.   PROCEDURES:  1. Endoscopy on March 03, 2009.  Please see operative note for further      details.  2. Laparoscopic repair of hiatal hernia on March 06, 2009.  Please see      operative note for further details.   SURGEON:  Dr. Michaell Cowing.   LABORATORY DATA:  Significant for elevated white count, elevated lactate  IV, comprehensive metabolic panel significant for hypokalemia, elevated  glucose, transaminitis with an AST of 42.  Normal lipase.  Essentially  normal UA.  During the course of the care, she had a normal TSH level.  Her white count returned to normal.  The day before  discharge, her serum  cholesterol level was essentially normal, complete metabolic panel was  essentially normal, and serum triglycerides were essentially normal as  where her prealbumin levels.   BRIEF HOSPITAL COURSE:  This is a 77 year old female who is hospitalized  for gastric volvulus obstruction secondary to an incarcerated hiatal  hernia is repaired with laparoscopic surgery.  [name] came in  unable to tolerate p.o., very sick, and vomiting.  Initial diagnosis as  above revealed incarcerated hiatal hernia, was repaired with  laparoscopic technique.  On postop day #4 in the afternoon, [name]  was able to tolerate p.o. well.  The TPN that had been sustained in her  was tapered and then discontinued.  Postop day #5 and #6, [name]  has been very well, tolerating p.o., a soft mechanical diet very well,  had a bowel movement, and felt very well.  She was discharged on postop  day #6 on March 14, 2009, doing well, discharged with soft mechanical  diet.  [name] did have some SI early in her time as an inpatient and a  psych consult was ordered.  She was given a sitter initially.  However  [name] expressed no SI/HI during the last few days of her stay at  [facility] and her sitter was discontinued.  She was feeling very well from a  psych standpoint at discharge.   DISCHARGE INSTRUCTIONS:  Surgery provided instructions for wound care as  well as diet.  She is instructed to continue a soft mechanical diet.  Family Medicine Practice provided instructions for constipation as well  as general health care.  Essentially, she is instructed to eat soft  mechanical diet.  No restrictions on her activity with the exception of  wound care.   ISSUES TO BE FOLLOWED AFTER DISCHARGE:  Transition to full p.o. diet.   FOLLOWUP APPOINTMENT:  She has an appointment with Dr. Denyse Amass at [facility] on March 16, 2009 at 2 p.m.  She is  instructed to make an  appointment to see Dr. Michaell Cowing in 10-14 days  following her procedure.  Her phone number is [phone].  She is also  scheduled to see her psychiatrist Dr. Donell Beers.   DISCHARGE CONDITION:  The patient was discharged to home in stable  medical condition.      Clementeen Graham, MD  Electronically Signed      Arnette Norris. Sheffield Slider, M.D.  Electronically Signed    EC/MEDQ  D:  03/14/2009  T:  03/14/2009  Job:  [id number]   cc:   Graylin Shiver, M.D.  Ardeth Sportsman, MD

## 2010-12-25 NOTE — Op Note (Signed)
NAMEJIAYI, LENGACHER NO.:  [id number]   MEDICAL RECORD NO.:  [id number]          PATIENT TYPE:  INP   LOCATION:  5120                         FACILITY:  [facility]   PHYSICIAN:  Graylin Shiver, M.D.   DATE OF BIRTH:  05-Jul-1931   DATE OF PROCEDURE:  03/03/2009  DATE OF DISCHARGE:                               OPERATIVE REPORT   INDICATIONS FOR PROCEDURE:  The patient is a 77 year old female with  abdominal pain.  A CT scan showed a very large hiatal hernia with  obstruction signs.  Endoscopy is being done to determine if there is any  other cause of obstruction such as an ulcer.   Informed consent was obtained after explanation of the risks of  bleeding, infection, and perforation.   PREMEDICATIONS:  Fentanyl 50 mcg IV and Versed 5 mg IV.   PROCEDURE:  With the patient in the left lateral decubitus position, the  Pentax gastroscope was inserted into the oropharynx and passed into the  esophagus.  It was advanced down the esophagus following the course of  the NG tube which was in place.  The esophagus looked normal.  The  stomach was then entered.  The stomach was full of solid food despite  the patient having been suctioned through the NG all night long.  The  scope kept coiling in the stomach and I could never find the pylorus.  The scope was brought out.  She tolerated the procedure well without  obvious immediate complications.   IMPRESSION:  Stomach full of food, unable to find pylorus.  The scope  kept coiling in the stomach which I suspect is from coiling in this  large hiatal hernia.   I suspect that this problem of obstruction will require surgical  intervention to correct.           ______________________________  Graylin Shiver, M.D.     SFG/MEDQ  D:  03/03/2009  T:  03/04/2009  Job:  [id number]   cc:   Paula Compton, MD  [facility]

## 2010-12-25 NOTE — H&P (Signed)
[name], [name]              ACCOUNT NO.:  [id number]   MEDICAL RECORD NO.:  [id number]          PATIENT TYPE:  INP   LOCATION:  NA                           FACILITY:  [facility]   PHYSICIAN:  Ollen Gross, M.D.    DATE OF BIRTH:  [birth year]   DATE OF ADMISSION:  09/09/2007  DATE OF DISCHARGE:                              HISTORY & PHYSICAL   CHIEF COMPLAINT:  Left knee pain.   HISTORY OF PRESENT ILLNESS:  The patient is a 77 year old female who has  seen by Dr. Lequita Avery for ongoing knee problems.  She has previously  undergone a right total knee is doing well with that but the left knee  continues to be problematic, is progressive in nature.  She is felt to  have reached a point where she would benefit another surgical  intervention.  Risks and benefits have been discussed.  She elects to  proceed with surgery.   ALLERGIES:  DEMEROL causes hives.  PENICILLIN causes anaphylactic  reaction.   CURRENT MEDICATIONS:  Flaxseed oil, fish oil, B3, B12, ibuprofen,  Prozac, Hyzaar.  Prilosec and ibuprofen stopped.   PAST MEDICAL HISTORY:  1. Coronary arterial disease.  2. Angina.  3. Obesity.  4. Remote history of CVA with a history of AVM.  5. Claustrophobia.  6. Meningioma brain tumor.  7. Hiatal hernia.  8. History of gastrointestinal bleed.  9. History of anemia.  10.History of depression.  11.Hypertension.   PAST SURGICAL HISTORY:  1. Cranioplasty.  2. Breast reduction.  3. Removal of hemangioma.  4. Brain tumor procedure.  5. Hysterectomy.  6. Removal of ovarian cyst.  7. Right total knee.   FAMILY HISTORY:  Father deceased age 39 with history of MI, a brother  with bypass surgery.  Mother deceased in her 33s with Alzheimer's.  Sister with ulcerative colitis requiring colostomy.   SOCIAL HISTORY:  No tobacco, very seldom intake of alcohol.   REVIEW OF SYSTEMS:  GENERAL:  No fevers, chills, night sweats.  NEURO:  No seizures, syncope, paralysis.  RESPIRATORY:  No  shortness breath,  productive cough or hemoptysis.  CARDIOVASCULAR:  No chest pain, angina.  However she has given a history of mild palpitations over the past 3-4  weeks.  She has been seen over at [facility] by Dr. Doralee Albino and felt to be cleared for surgery. GI:  Past history of a  gastrointestinal bleed.  No nausea, vomiting, diarrhea, constipation.  GU:  No dysuria, hematuria, discharge.  MUSCULOSKELETAL:  Left knee.   PHYSICAL EXAM:  VITAL SIGNS:  Pulse 64, respirations 14, blood pressure  136/60.  GENERAL:  A 77 year old white female, well nourished, well developed,  anxious, overweight, no acute distress.  She is alert, oriented and  cooperative.  HEENT:  Normocephalic, atraumatic.  Pupils round, reactive.  EOMs  intact.  NECK:  Supple.  CHEST:  Clear anterior posterior chest walls.  No rhonchi, rales or  wheezing.  HEART:  Regular rate and rhythm.  A grade 2 to 3/6 systolic ejection  murmur.  ABDOMEN:  Soft, nontender.  Bowel sounds present.  RECTAL/BREASTS/GENITALIA:  Not done, not pertinent to present illness.  EXTREMITIES:  Left knee:  Slight varus malalignment.  Marked crepitus.  Range of motion 5-120.  No instability.   IMPRESSION:  1. Osteoarthritis, left knee.  2. Coronary arterial disease.  3. Angina.  4. Obesity.  5. Remote history of cerebrovascular accident with history of      arteriovenous malformation.  6. Claustrophobia.  7. Meningioma brain tumor.  8. Hiatal hernia.  9. History of gastrointestinal bleed.  10.History of anemia.  11.History of depression.  12.Hypertension.  13.Recent palpitations.   PLAN:  The patient admitted to [facility] to undergo a left  total knee replacement arthroplasty.  Surgery will be performed by Dr.  Ollen Gross.  She has been seen by Dr. Santiago Bumpers. Hensel for Dr.  Beverely Low recently for the new onset of palpitations.  We did receive a  call and stated that she has been given surgical  clearance.     Alexzandrew L. Perkins, P.A.C.      Ollen Gross, M.D.  Electronically Signed   ALP/MEDQ  D:  09/08/2007  T:  09/09/2007  Job:  [id number]   cc:   Neena Rhymes, M.D.

## 2010-12-25 NOTE — H&P (Signed)
[name], MURTON NO.:  [id number]   MEDICAL RECORD NO.:  [id number]          PATIENT TYPE:  INP   LOCATION:  5120                         FACILITY:  [facility]   PHYSICIAN:  Paula Compton, MD        DATE OF BIRTH:  [birth year]   DATE OF ADMISSION:  03/02/2009  DATE OF DISCHARGE:                              HISTORY & PHYSICAL   PRIMARY CARE PHYSICIAN:  Romero Belling, MD   CHIEF COMPLAINT:  Abdominal pain and vomiting.   HISTORY OF PRESENT ILLNESS:  A 77 year old female presenting with acute  onset of abdominal pain and vomiting last night that started about 5:30  p.m. after the patient finished eating dinner.  Nothing abnormal was  eaten for dinner.  After about 10 minutes prior to eating, she had, what  she described as, sharp diffuse left-sided abdominal pain.  The patient  then started having nausea and vomiting.  She noticed food in her first  emesis but no blood.  Subsequent nausea and vomiting lasted throughout  the night.  The patient described all other emesis as clear and bubbly,  very little liquid.  At about 4:30 this morning, the patient could not  tolerate the pain anymore and called 911.  She was able to ambulate.  Denied any loss of consciousness, falls, weakness, diarrhea, or  constipation.  Denies any blood in her stool or emesis.  Denies  headache, dyspnea, or chest pain.  Admits only the sharp abdominal pain  on the left, described as a 10/10 at its worse, pain has been  persistent.  The patient did not take anything at home to relief her  pain as anything taken by mouth caused immediate vomiting.  Nothing made  her pain better.  Currently in the ER she was given Dilaudid, the pain  is now at about 8/10.  She does have a history of hiatal hernia present  since 1950.  CT angio of the abdomen and pelvis were done in the ER and  showed large hiatal hernia was marked essential in the stomach.  Findings highly suspicious of gastric outlet obstruction  at distal  stomach/gastroduodenal junction which lies at the diaphragmatic hiatus,  patent mesenteric arteries without evidence or occlusion or  hemodynamically significant stenosis.  No evidence of acute abnormality  was seen within the pelvis.   Surgery was already consulted and question gastric outlet obstruction.  At this time, patient did not appear to be  a surgical candidate and  recommended a GI consult to consider an EGD, UGI/SBFT as the initial  workup.   REVIEW OF SYSTEMS:  Per HPI.  The patient does admit to abdominal pain,  nausea, and vomiting.   ALLERGIES:  PENICILLIN, DILANTIN, MORPHINE, and PHENOBARBITAL.   HOME MEDICATIONS:  1. Hyzaar 100/25 mg 1 tab p.o. daily.  2. Lipitor 10 mg p.o. daily.  3. Prozac 60 mg p.o. daily.  4. Prilosec 40 mg p.o. daily.  5. Albuterol inhaler 1 puff q.4-6 h. p.r.n. for wheezing.   PAST MEDICAL HISTORY:  Left breast mass, rheumatoid arthritis,  osteoarthritis, peripheral neuropathy, hypertension, hyperlipidemia,  convulsions, anxiety, angina pectoris, hiatal hernia since 1950s,  meningioma status post resection in 1988, carpal tunnel syndrome,  bipolar versus depression versus personality disorder, asthma, breast  reduction in 2003, PTL  in 2003, total hysterectomy with right  oophorectomy in 2003.   FAMILY HISTORY:  Father died at 79 of an MI.  Mother with Alzheimer's.  Brother with an MI at 37.   FAMILY HISTORY:  Positive for multiple suicides.   SOCIAL HISTORY:  Single, retired Engineer, civil (consulting), very involved in the community,  especially for animal rights.  No history of smoking, alcohol, or drug.   LABORATORY DATA:  Sodium of 138, potassium at 3.1, BUN 16, and  creatinine 0.77, glucose 225, calcium 9.9, total bili was 1.0.  Alk phos  93, AST 42, ALT 26, total protein 8.4, albumin 4.7, lactic acid 4.0,  lipase 35.  CBC:  White blood count 11.1, hemoglobin 15.8, hematocrit  45.8, platelets 233.  UA was yellow, clear.  Glucose of 100,  bili  negative.  Ketones 15, trace blood proteins greater then 300, leukocyte  negative.  Urine micro showed 0-2 white blood cells, 0-2 red blood  cells, hyaline casts were present.  Acute abdominal series showed  cardiomegaly with low lung volumes, possible right base air space  disease, large hiatal hernia, no bowel obstructions.  CT of the angio,  abdomen, and pelvis, large hiatal hernia, marked distention of stomach,  findings hightly suspicious for gastric outlet obstruction at distal  stomach, gastroduodenal junction which lies at the diaphragmatic hiatus.  No evidence of acute abnormality within the pelvis.   PHYSICAL EXAMINATION:  VITAL SIGNS:  The patient with O2 sats 94% on  room air, pulse 100, respirations 17 per minute, and BP was 65/86.  GENERAL:  A well developed, well nourished, in no acute distress.  Alert, appropriate and cooperative throughout the examination.  HEAD:  Normocephalic and atraumatic without obvious abnormalities.  MOUTH:  Oral mucosa and oropharynx without lesions or exudates.  NECK:  No deformities, masses, or tenderness.  No JVD or carotid bruits.  LUNGS:  Normal respiratory effect.  Chest expanded symmetrically.  Lungs, clear to bilateral auscultation.  No crackles.  No wheezes.  HEART:  Regular rate and rhythm.  S1 and S2 normal without gallop,  murmur, click, rub, or other extra sounds.  ABDOMEN:  Soft.  Abdominal scar is noted.  Abdomen sounds absent.  Distended epigastric tenderness, left upper quadrant and left lower  quadrant tenderness.  NEUROLOGIC:  A and O x3.  Cranial nerves II through XII are intact.  SKIN:  Intact without lesions or rashes.  PSYCHIATRIC:  Cognition and judgment appear intact.  No apparent  delusions, hallucinations or illusions noted.   IMPRESSION AND RECOMMENDATIONS:  1. Abdominal pain, acute, started last night and constant.  Surgery      already evaluated, do not feel like she is a surgical patient for      now.   Recommended GI consult.  GI was called and we will follow up      with the recommendations.  The patient was given Dilaudid 1 mg IV      q.2 h. as needed for pain.  She will be kept n.p.o. for now until      GI is able to see her and evaluate her.  2. Hypokalemia.  In the ER, potassium was 3.1.  We will give KCl 10      mEq IV x4 runs and monitor her potassium in the morning.  3. Hypertension.  We will give labetalol 10 mg IV q.1 h. if systolic      blood pressure is greater than 180 and we will continue to monitor.  4. Gastroesophageal reflux disease with a history of hiatal hernia      since 1950.  We will give Protonix 20 mg IV daily and monitor.  5. Bipolar disorder versus depressive disorder.  The patient is on      Prozac at home.  We will hold that for now.  She is n.p.o. and      restart when she is able to take p.o. by mouth.  6. Hyperglycemia.  Glucose was 225 on BMP.  We will start her on a      sliding scale insulin and monitor her CBGs.  7. Fluids, electrolytes, nutrition/gastrointestinal.  N.p.o. for now,      IV fluids normal saline 125 mL per hour.  We will continue to      monitor her hydration status.  8. Prophylaxis, heparin 5000 units q.8 h.  9. Code is full.  10.Disposition, pending resolution of symptoms.      Alvia Grove, DO  Electronically Signed      Paula Compton, MD  Electronically Signed    BB/MEDQ  D:  03/02/2009  T:  03/03/2009  Job:  [id number]

## 2010-12-25 NOTE — Op Note (Signed)
[name], [name]:  [id number]   MEDICAL RECORD NO.:  [id number]          PATIENT TYPE:  INP   LOCATION:  3301                         FACILITY:  [facility]   PHYSICIAN:  Ardeth Sportsman, MD     DATE OF BIRTH:  [birth year]   DATE OF PROCEDURE:  DATE OF DISCHARGE:                               OPERATIVE REPORT   PRIMARY CARE PHYSICIAN:  Romero Belling, MD, at [facility].   GASTROENTEROLOGIST:  Graylin Shiver, MD with [facility].   SURGEON:  Ardeth Sportsman, MD   ASSISTANT:  Lennie Muckle, MD   PREOPERATIVE DIAGNOSIS:  Incarcerated paraesophageal hiatal hernia with  volvulization of stomach and gastric outlet obstruction.   POSTOPERATIVE DIAGNOSIS:  Incarcerated paraesophageal hiatal hernia with  volvulization of stomach and gastric outlet obstruction.   PROCEDURE PERFORMED:  1. Laparoscopic lysis of adhesion x90 minutes.  2. Laparoscopic paraesophageal hiatal hernia primary repair with      pledgets.  3. Laparoscopic Nissen fundoplication (360 degrees), short time, 2      stitch Nissen x1.2 cm.   ANESTHESIA:  1. General anesthesia.  2. Local anesthetic and field block around port sites.   SPECIMENS:  Anterior mediastinal hernia sac.   DRAINS:  A 19-French Blake drain comes from the right upper quadrant  over the right abdomen over the stomach and near the splenic fossa, and  then up into the anterior mediastinum.   ESTIMATED BLOOD LOSS:  150 mL.   COMPLICATIONS:  None apparent.   INDICATIONS:  [name] is a 77 year old obese female, who has had  chronic paraesophageal hiatal hernia.  She has had worsening episodes of  nausea, vomiting, and came in with acute abdominal pain and vomiting.  She had an NG tube placed and had a CAT scan that showed a large  paraesophageal hiatal hernia.  She did not open up.  Surgical  consultation was made.  Given my expertise, Dr. Darnell Level requested my  help in  evaluation.   The anatomy and physiology of the digestive tract was explained and  pathophysiology of paraesophageal hiatal hernia with gastric outlet  obstruction was discussed.  Dr. Evette Cristal with Gastroenterology did an  endoscopy to confirm no evidence of any necrosis, but definite gastric  outlet obstruction as a result of the volvulization.   Recommendation was made for laparoscopic possible open reduction of the  stomach out of the mediastinum into the abdomen and hiatal hernia repair  and fundoplication.  Risks, benefits, alternatives were discussed.  Postop recovery was discussed.  Questions were answered and she agreed  to proceed.  I have not been able to find any family members, nor has  she confessed having any family members that I can discuss this with.   OPERATIVE FINDINGS:  She had about two-thirds of her stomach herniated  up into the mediastinum.  It was volvulized in an organic-axial  rotation.  There were some mild to moderate ischemia, but no necrosis.  She did not have a shortened esophagus.  The rest of her anatomy  appeared to be normal.  DESCRIPTION OF THE PROCEDURE:  Informed consent was confirmed.  The  patient already had a nasogastric tube in place.  She received IV  clindamycin and gentamicin prior to surgery.  She had sequential  pressure devices active during the entire case.  She underwent general  anesthesia without any difficulty.  She was placed in a split leg table  with arms tucked.  A Foley catheter was sterilely placed.  Her abdomen  was prepped and draped in a sterile fashion.   Surgical time-out confirmed our plan.  A #5-mm port was placed in the  left upper quadrant paramedian region using optical entry technique with  the patient steep reverse Trendelenburg and left side up.  Camera  inspection revealed no intra-abdominal injury.  Under direct  visualization, a 12-mm port was placed in the left subcostal ridge.  A 5-  mm port was placed in  the left subxiphoid region and a omega-shaped  rigid retractor was used to lift the left lateral sector of the liver  anterior to expose the enlarged esophageal hiatus and it was obviously  incarcerated in the stomach.  The retractor was attached to the bed  using an Iron Man system.  A 5-mm port was placed in the right  subxiphoid region just inferior through the falciform ligament inferior  to the left liver edge.  Another 5-mm port was placed in the right  midabdomen.   The apex of the crura was grasped and entered.  Attempt was made to  release the anterior mediastinal sac from the anterior hernia sac from  its attachments to the mediastinum.  However, it shredded anteriorly.  I  gradually was able to follow down the right crus and free off some  phrenoesophageal attachments.  I found the hepatogastric ligament and  ligated it coming up to the right crus.   I found the greater curvature of the stomach and was able to ligate the  greater omentum and short gastrics off the stomach all the way up to the  left crus.  This helped to identify left crus and I freed up some  phrenoesophageal attachments more posteriorly.  I could free off the  posterior mediastinal rather well.  Eventually, over time on the left  side, once I freed up the short gastrics, I was able to come to the  split between the anterior and posterior mediastinal sacs and able to  relocate the mediastinal sac on the anterior mediastinal sac on the left  lateral crus and gradually help free that off.  I did the same thing on  the right side and got some healthy anterior mediastinal sac more  laterally and gradually was able to reduce that in and bluntly free the  mediastinal sac from its attachments to the right pleura.  Eventually,  over time, through careful focused blunt dissection and ultrasonic  dissection, I was able to reduce most of the hernia sac down, and  finally able to reduce the obviously incarcerated  proximal stomach and  especially the very dilated fundus.  It was obvious that it had a lot of  retained food within it.  It was somewhat ischemic, but later on in the  case, it pinked up and looked more normal.   The stomach and esophagus were placed on axial tension and a type 2  mediastinal dissection was done to free the esophagus from its  attachments in the mediastinum.  The anterior and posterior vagus  branches actually could be seen and these  were preserved at all times.  Eventually, I was able to free up esophagus above the level of the  heart.  I did have a breach into the right pleura, but no major  contamination.  With this dissection, I was able to get about 4 cm of  esophageal length into the abdomen.   The anterior hernia sac was skeletonized off the anterior vagus nerve  and transected off.  It was somewhat dilated and enlarged.  With that, I  was able to follow it and leave the posterior epiphrenic pad as well.  There was a fatty posterior hernia sac on the right posterior side.  We  gradually peeled that back to avoid over skeletonization along the  lesser curve.  The anterior and posterior vagus nerves were intact.   The fundus of the stomach was brought up behind the esophagus to set up  the right side of the wrap and a classic shoe-shine maneuver was made.  Actually, it laid quite well.  The stomach was reflected laterally to  expose esophageal hiatus.  The hiatal defect was about 12 x 7 cm in  size.   The crura were approximated using #1 double-armed Ethibond stitches  armed with 1 x 2 cm pledgets on each side.  Three of these stitches were  used to help reapproximate the crura well, so it was snug around the  esophagus but easily allowed a Glassman pass into the mediastinum.   The wrap was reset up and the posterior gastropexy was done by doing  stitches on the posterior side of the right wrap and through the crus as  well using #1 Ethibond stitches.  Left  anterior gastropexy was done by  taking a stitch at the apex of the left side of the wrap towards the  apex of the left crus to side that well.  The internal esophagogastric  plication stitches (Guarner) stitches were done.  A bite was taken in  the inner side of the left wrap to the left lateral part of the true  esophagus.  Another bite was taken in a mirror-imae fashion on the right  side.   A 56-French bougie was passed by anesthesia through the oropharynx down  the esophagus without any difficulty and from the esophagus in the  stomach using axial tension.  It passed very easily without any  difficulty.  A two-stitch Nissen was done taking a bite of the left  anterior wrap of the stomach, anterior esophagus, and the right anterior  part of the stomach as well and tied down.  Because she was 77 years old  and I do not know her esophageal motility, I just did a 2-stitch Nissen  measured to 1.2 cm.  The wrap laid well.  There was some mild ischemia  with the wrap, but not severe.  The rest of the stomach looked viable  and normal.  The gastric contents had moved down towards the antrum.  The stomach laid well without any twists or torsion.  Copious irrigation  was done with clear return.  There had been some bleeding from some  short gastric vessels, but all hemostasis was good at the end.  The  spleen looked viable and normal as well.  Copious irrigation with 2 L of  abdomen with clear return.  The drain was placed as noted above and  secured to the skin using a nylon stitch.  The liver retractor was  removed under direct visualization.  The 12-cm port was  reapproximated  using a laparoscopic suture passer to do 0 Vicryl passes x2 in a figure-  of-eight fashion with good result.  Capnopreperitoneum was evacuated and  ports were removed.  Fascial stitch was tied down.  Skin was closed  using 4-0 Monocryl stitch.  Sterile dressings were applied.   The patient was extubated and taken to  recovery room in stable  condition.  Given her age and the urgency of this case, we will monitor  her in step-down right now.  I did have a nasogastric tube replaced, 18-  [place], by Anesthesia to make sure that she do not have prolonged  gastric ileus.   I discussed the case with her friends and later with her son on the  phone in [place].  All expressed understanding and appreciation.      Ardeth Sportsman, MD  Electronically Signed     SCG/MEDQ  D:  03/06/2009  T:  03/07/2009  Job:  [id number]   cc:   Paula Compton, MD  Romero Belling, MD  Graylin Shiver, M.D.

## 2010-12-28 NOTE — Op Note (Signed)
[name], [name]                        ACCOUNT NO.:  [id number]   MEDICAL RECORD NO.:  [id number]                   PATIENT TYPE:  OIB   LOCATION:  2899                                 FACILITY:  [facility]   PHYSICIAN:  Salley Scarlet., M.D.         DATE OF BIRTH:  1931/07/28   DATE OF PROCEDURE:  11/25/2003  DATE OF DISCHARGE:  11/25/2003                                 OPERATIVE REPORT   PREOPERATIVE DIAGNOSIS:  Immature cataract, right eye.   POSTOPERATIVE DIAGNOSIS:  Immature cataract, right eye.   PROCEDURE:  Kelman phacoemulsification, cataract, right eye, with  intraocular lens implantation.   ANESTHESIA:  Local using Xylocaine 2% with Marcaine 0.75% and [place].   JUSTIFICATION FOR PROCEDURE:  This is a 77 year old lady who underwent a  cataract extraction from the left eye with good visual results several  months ago.  She has a cataract of the right eye, which causes her  difficulty in seeing to read and to drive at night.  She was evaluated and  found to have a visual acuity best corrected to 20/60 on the right and 20/30  on the left.  There was a 2+ nuclear sclerotic cataract on the right.  Cataract extraction with intraocular lens implantation was recommended.  She  was admitted at this time for that purpose.   PROCEDURE:  Under the influence of IV sedation, a Van Lint akinesis and  retrobulbar anesthesia was given.  The patient was prepped and draped in the  usual manner.  The lid speculum was inserted under the upper and lower lid  of the left eye and a 4-0 silk traction suture was passed through the belly  of the superior rectus muscles for traction.  A fornix-based conjunctival  flap was turned and hemostasis was achieved by using cautery.  An incision  made in the sclera at the limbus at the 11 o'clock position.  A side port  incision was made at the 1:30 clock hour position using a Superblade.  Occucoat was injected into the eye through the side port  incision.  The  anterior chamber was then entered through the corneoscleral tunnel incision  at the 11:30 clock hour position.  An anterior capsulotomy was done using a  bent 25-gauge needle.  The nucleus was hydrodissected using Xylocaine.  The  KPE handpiece was passed into the eye and the emulsification was begun.  [place] during the procedure it was noted there was a tear in the posterior  capsule.  The procedure was then converted to an extracapsular procedure.  The corneoscleral wound was entered first toward the left, then toward the  right, using corneal scissors.  A single-armed 8-0 Vicryl suture was placed  across each arm of the wound was it was made respectively toward the left,  then toward the right.  The nucleus was then manually expressed from the  eye.  The two previously-placed sutures were closed and a third one  was  placed at the 12 o'clock position.  An anterior vitrectomy was done.  The  cortical material was then aspirated from the eye.  Additional vitreous was  removed.  After ascertaining that all the cortex had been removed, a  peripheral iridectomy was made in the iris and an anterior chamber lens was  seated across the pupil, which had been constricted using Miochol.  The  anterior chamber lens was rotated in a horizontal position.  The anterior  chamber was reformed and the pupil was constricted further using Miochol.  The corneoscleral wound was closed by using a combination of interrupted  sutures of 8-0 Vicryl and 10-0 nylon.  After it was ascertained that the  wound was airtight and watertight, the conjunctival was closed using thermal  cautery.  Celestone 1 mL and 0.5% mL of gentamicin were injected  subconjuctivally.  Maxitrol ophthalmic ointment and atropine drops were  applied along with a patch and Fox shield.  The patient tolerated the  procedure well and was discharged to the postanesthesia  recovery room in satisfactory condition.  She is instructed  to rest today,  to take Vicodin every four hours as needed for pin, and to see me in the  office tomorrow for further evaluation.   DISCHARGE DIAGNOSIS:  Immature cataract, right eye.                                               Salley Scarlet., M.D.    TB/MEDQ  D:  11/27/2003  T:  11/28/2003  Job:  [id number]

## 2010-12-28 NOTE — H&P (Signed)
NAMEROSELAND, BRAUN              ACCOUNT NO.:  [id number]   MEDICAL RECORD NO.:  [id number]          PATIENT TYPE:  INP   LOCATION:  NA                           FACILITY:  [facility]   PHYSICIAN:  Ollen Gross, M.D.    DATE OF BIRTH:  1931-03-27   DATE OF ADMISSION:  09/10/2006  DATE OF DISCHARGE:                              HISTORY & PHYSICAL   Date of office visit, history and physical September 02, 2006.   CHIEF COMPLAINT:  Right greater than left knee pain.   HISTORY OF PRESENT ILLNESS:  The patient is a 77 year old female who has  been seen by Dr. Lequita Halt for ongoing knee pain.  She is known to have  end-stage arthritis.  The right is more symptomatic and problematic than  the left.  It is progressive in nature and refractory to nonoperative  management.  It was felt that she has reached a point where she would  benefit from undergoing surgical intervention.  Risks and benefits have  been discussed and she has elected to proceed with surgery.   ALLERGIES:  1. DEMEROL.  2. PENICILLIN causes an anaphylactic reaction.   CURRENT MEDICATIONS:  Cymbalta which has been increased to 60 mg,  lorazepam, Wellbutrin and she has stopped ibuprofen.   PAST MEDICAL HISTORY:  1. Coronary arterial disease.  2. Angina.  3. Obesity.  4. Remote history of CVA with a history of AVM.  5. Claustrophobia.  6. Meningioma brain tumor.  7. Hiatal hernia.  8. History of a GI bleed.   PAST SURGICAL HISTORY:  1. Cranioplasty.  2. Breast reduction.  3. Removal of hemangioma.  4. Brain tumor.  5. Hysterectomy.  6. Removal of ovarian cyst.   FAMILY HISTORY:  Father deceased at age 66 with history of MI.  Brother  with bypass surgery.  Mother deceased in her 34s with history of  Alzheimer's.  She has a sister with ulcerative colitis requiring  colostomy.   SOCIAL HISTORY:  No tobacco, very seldom intake of alcohol.   REVIEW OF SYSTEMS:  GENERAL:  No fevers, chills or night sweats.  NEUROLOGICAL:  No seizures, syncope or paralysis.  RESPIRATORY:  No  shortness of breath, productive cough or hemoptysis.  CARDIOVASCULAR:  No chest pain, angina or orthopnea.  GI:  History of GI bleed.  No  nausea, vomiting, diarrhea or constipation.  GU:  No dysuria, hematuria  or discharge.   PHYSICAL EXAMINATION:  VITAL SIGNS:  Pulse 84, respirations 12, blood  pressure 118/68.  GENERAL:  A 77 year old white female well-developed, well-nourished,  obese, in no acute distress.  She is alert, oriented and cooperative.  Appears to be a good historian.  HEENT:  Normocephalic and atraumatic.  Pupils round and reactive.  Oropharynx clear.  EOMs intact.  She does have partial upper denture  plate.  NECK:  Supple.  CHEST:  Clear anterior and posterior chest wall.  HEART:  Regular rhythm with a pansystolic ejection murmur.  Does start  over the left sternal border and aortic region.  S1-S2 noted.  ABDOMEN:  Soft, round, protuberant abdomen.  Bowel sounds present.  RECTAL/BREASTS/GENITALIA:  Not done, not pertinent to present illness.  EXTREMITIES:  Right knee range of motion of 5 to 115, medial tenderness.  No instability.  Left knee range of motion 5 to 120, medial tenderness.  No instability.   IMPRESSION:  1. Osteoarthritis bilateral knees, right greater than left.  2. Coronary arterial disease.  3. Angina.  4. Obesity.  5. History of meningioma brain tumor requiring cranioplasty.  6. History of arteriovenous malformation.  7. History of cerebrovascular accident, postoperative hemiparesis      following cranioplasty.  8. Hiatal hernia.  9. History of gastrointestinal bleed.   PLAN:  The patient will be admitted to [facility] to undergo  right total knee arthroplasty.  Surgery will be performed by Dr. Ollen Gross.      Alexzandrew L. Julien Girt, P.A.      Ollen Gross, M.D.  Electronically Signed    ALP/MEDQ  D:  09/09/2006  T:  09/10/2006  Job:  [id number]    cc:   Ollen Gross, M.D.  Fax: [phone]   Neena Rhymes, M.D.   Patient's chart

## 2010-12-28 NOTE — Discharge Summary (Signed)
NAMESHANESSA, [name]              ACCOUNT NO.:  [id number]   MEDICAL RECORD NO.:  [id number]          PATIENT TYPE:  INP   LOCATION:  1607                         FACILITY:  [facility]   PHYSICIAN:  Ollen Gross, M.D.    DATE OF BIRTH:  1931-07-10   DATE OF ADMISSION:  09/09/2007  DATE OF DISCHARGE:  09/12/2007                               DISCHARGE SUMMARY   ADMISSION DIAGNOSES:  1. Osteoarthritis, left knee.  2. Coronary arterial disease.  3. Angina.  4. Obesity.  5. Remote history of cerebrovascular accident with history of      arteriovenous malformation.  6. Claustrophobia.  7. Meningioma brain tumor.  8. Hiatal hernia.  9. History of gastrointestinal bleed.  10.History of anemia.  11.History of depression.  12.Hypertension.  13.Recent palpitations.   DISCHARGE DIAGNOSES:  1. Osteoarthritis of the left knee, status post left total knee      replacement arthroplasty.  2. Mild postoperative hyponatremia, improved.  3. Postoperative hypokalemia,  improved.  4. Osteoarthritis, left knee.  5. Coronary arterial disease.  6. Angina.  7. Obesity.  8. Remote history of cerebrovascular accident with history of      arteriovenous malformation.  9. Claustrophobia.  10.Meningioma brain tumor.  11.Hiatal hernia.  12.History of gastrointestinal bleed.  13.History of anemia.  14.History of depression.  15.Hypertension.  16.Recent palpitations.   PROCEDURE:  On September 09, 2007, left total knee.  Surgeon:  Ollen Gross, M.D..  Assistant:  Alexzandrew L. Perkins, P.A.-C.  Spinal  anesthesia.   CONSULTATIONS:  None.   BRIEF HISTORY:  [name] is a 77 year old female with end-stage arthritis  of the left knee, progressive worsening pain and dysfunction and  successful right total knee, who now presents for the left total knee.   LABORATORY DATA:  Preop CBC showed hemoglobin 13.8, hematocrit of 40,  white cell count 5.8, platelets 217.  Postop hemoglobin 11.6, dropped  down to  10.3, last noted H&H 10.1 and 28.9.  PT/PTT preoperatively 12.8  and 228, respectively.  INR 0.9.  Serial pro times followed.  Last noted  PT/INR 15.3 and 1.2.  Chem panel on admission all within normal limits.  Serial BMETs were followed.  Sodium did drop from 138 to 131, got as low  as 129, back up to 137.  Potassium dropped from 5.0 to 3.3, back up to  3.5.  Preop UA negative.  Blood group type O+.   EKG September 04, 2007:  Normal sinus rhythm, left axis deviation, poor R-  wave progression, no significant change from last tracing.  Performed by  Dr. Lady Deutscher.  Two-view chest September 07, 2007:  Large hiatal  hernia.  No active disease.   HOSPITAL COURSE:  The patient was admitted to [facility] and  tolerated the procedure well, later transferred to the recovery room and  the orthopedic floor, started on PCA and p.o. analgesic for pain control  following surgery.  Doing pretty well on the morning of day #1.  Was  able to get some sleep, a little drowsy.  Potassium had dropped postop  so we put her  on some potassium supplements.  She had a new onset of  palpitations preop, was seen by her medical physician.  She had denied  any postoperatively.  Started back on her home medications.  Hemoglobin  was stable.  Started getting up and by day #2 she was walking 40 feet,  doing well with therapy.  Dressing was changed.  Incision looked good.  Sodium was a little down to 129 so we discontinued her fluids and  rechecked them.  Progressing well with therapy.  By the next day sodium  was back up to 137.  She was progressing well, walking 150 feet and  wanted to go home.   DISCHARGE PLAN:  1. The patient was discharged home on September 12, 2007.  2. Discharge diagnoses:  Please see above.  3. Discharge medications:  Percocet, Robaxin, Coumadin.  4. Diet:  Low-sodium, heart-healthy diet.  5. Activity:  Weightbearing as tolerated, left lower extremity.      Follow total knee  protocol.  6. Follow-up:  Two weeks.   DISPOSITION:  Home.   CONDITION ON DISCHARGE:  Improved.      Alexzandrew L. Perkins, P.A.C.      Ollen Gross, M.D.  Electronically Signed    ALP/MEDQ  D:  10/20/2007  T:  10/21/2007  Job:  [id number]   cc:   Neena Rhymes, M.D.

## 2010-12-28 NOTE — Op Note (Signed)
[name], [name]                        ACCOUNT NO.:  [id number]   MEDICAL RECORD NO.:  [id number]                   PATIENT TYPE:  OIB   LOCATION:  2894                                 FACILITY:  [facility]   PHYSICIAN:  Salley Scarlet., M.D.         DATE OF BIRTH:  13-Feb-1931   DATE OF PROCEDURE:  DATE OF DISCHARGE:  04/22/2003                                 OPERATIVE REPORT   PREOPERATIVE DIAGNOSIS:  Immature cataract, left eye.   POSTOPERATIVE DIAGNOSIS:  Immature cataract, left eye.   OPERATION:  Kelman phaco-emulsification cataract, left eye.   ANESTHESIA:  Local using Xylocaine 2% with Marcaine 0.75%.   INDICATIONS FOR PROCEDURE:  This 77 year old lady complains of blurring  vision with difficulty seeing in this eye particularly in the bright sun.  She also has difficulty seeing to read. She was evaluated and found to have  a visual acuity best corrected to 20/50 on the right, 20/70 on the left.  There are bilateral posterior capsular cataracts worse on the left than the  right. Cataract extraction with potential ocular lens implantation was  recommended. She is admitted at this time for that purpose.   DESCRIPTION OF PROCEDURE:  Unde the influence of IV sedation, _______  akinesia and retrobulbar anesthesia was given. The patient was prepped and  draped in the usual manner. The lid speculum was inserted under the upper  lower lid of the left eye and a 4-0 silk traction suture was passed through  the belly of the superior rectus muscle retraction.  The fornix-based  conjunctival flap was turned, hemostasis achieved using cautery. An incision  was made in the sclera at the limbus. This incision was dissected down the  inferior cornea using crescent blade. A side-port incision was made at the  1:30 o'clock position. OcuCoat was injected into the eye through the side-  port incision. The anterior chamber was then entered through the  corneoscleral tunnel incision  at the 11 and 3 o'clock position. An anterior  capsulotomy was then used  using a bent 25 gauge needle. The nucleus was  hydrodissected using Xylocaine. The KPE handpiece was passed into the eye  and the nucleus was emulsified without difficulty. The residual cortical  material was aspirated. The posterior capsule was polished using a high tech  polisher. The wound was widened slightly to accommodate a portal silicone  lens. This lens was seated into the eye behind the iris without difficulty.  The anterior chamber was reformed and the pupil was constricted using  Miochol. The lips of the wound were hydrated and tested to make sure that  there was no leak. Ascertaining that there was no leak, the conjunctiva was  closed over the wound using thermal cautery. Celestone 1 mL and 0.5 mL of  gentamicin were injected subconjuctivally. Maxitrol ophthalmic ointment and  pilocarpine ointment were applied along with a patch and fox shield. The  patient tolerated the procedure well  and was discharged to the post  anesthesia care unit in satisfactory condition. She was instructed today, to  take Vicodin every four hours as needed for pain and to see me in the office  tomorrow for further evaluation.   DISCHARGE DIAGNOSIS:  Immature cataract, left eye.      TB/MEDQ  D:  04/23/2003  T:  04/23/2003  Job:  [id number]

## 2010-12-28 NOTE — Op Note (Signed)
[name], [name]              ACCOUNT NO.:  [id number]   MEDICAL RECORD NO.:  [id number]          PATIENT TYPE:  INP   LOCATION:  0006                         FACILITY:  [facility]   PHYSICIAN:  Ollen Gross, M.D.    DATE OF BIRTH:  1930-08-31   DATE OF PROCEDURE:  09/10/2006  DATE OF DISCHARGE:                               OPERATIVE REPORT   PREOPERATIVE DIAGNOSIS:  Osteoarthritis right knee.   POSTOPERATIVE DIAGNOSIS:  Osteoarthritis right knee.   PROCEDURE:  Right total knee arthroplasty.   SURGEON:  Ollen Gross, M.D.   ASSISTANT:  Avel Peace, PA-C   ANESTHESIA:  Spinal.   ESTIMATED BLOOD LOSS:  Minimal.   DRAIN:  Hemovac times one.   TOURNIQUET TIME:  38 minutes at 300 mmHg.   COMPLICATIONS:  None.   CONDITION:  Stable to recovery.   BRIEF CLINICAL NOTE:  Ms. Igoe is a 77 year old female end-stage  osteoarthritis of the right knee with progressively worsening pain.  She  has failed nonoperative management including injections and presents for  total knee arthroplasty.   PROCEDURE IN DETAIL:  After successful administration of spinal  anesthetic, a tourniquet was placed on her right thigh and her right  lower extremity is prepped and draped in usual sterile fashion.  Extremity is wrapped with an Esmarch, knee flexed, tourniquet inflated  300 mmHg.  Midline incision made with 10 blade through subcutaneous  tissue to the level of the extensor mechanism.  Fresh blade is used to  make a medial parapatellar arthrotomy.  Soft tissue over the proximal  medial tibia subperiosteally elevated to the joint line with the knife  and into the semimembranosus bursa with a Cobb elevator.  Soft tissue  laterally is elevated with attention being paid to avoiding patellar  tendon on tibial tubercle.  Patella subluxed laterally, knee flexed 90  degrees, ACL and PCL removed.  Drill used to create a starting hole and  distal femur canal was thoroughly irrigated.  5 degrees  right valgus  alignment guide is placed and referencing off the posterior condyles  rotations marked and the block pinned to remove 11 mm of the distal  femur.  I took 11 because of a flexion contracture.  Distal femoral  resection is made with an oscillating saw.  Sizing blocks placed, size 3  is most appropriate.  Size 3 cutting block is placed referencing off the  epicondylar axis and then the anterior-posterior and chamfer cuts were  made.   Tibia subluxed forward and menisci removed.  Extramedullary tibial  alignment guide is placed referencing proximally at the medial aspect of  the tibial tubercle and distally along the second metatarsal axis and  tibial crest.  The block is pinned to remove 10 mm of the non deficient  lateral side.  Tibial resection is made with an oscillating saw.  Size 3  is the most appropriate tibial component and the proximal tibia is  prepared with a modular drill and keel punch for a size 3.  Femoral  preparation is completed with the intercondylar cut.   Size three mobile bearing tibial trial and  size three posterior  stabilized femoral trial and 10 mm posterior stabilized rotating  platform insert trial placed.  With the 10, full extensions achieved  with excellent varus and valgus balance throughout full range of motion.  The patella was everted and the thickness measured to be 22 mm.  Freehand resection taken to 12 mm, 35 template is placed, lug holes were  drilled, trial patella is placed and it tracks normally.  Osteophytes  removed off the posterior femur with the trial in place.  All trials  removed and cut bone surfaces prepared with pulsatile lavage.  Cement  mixed.  Once ready for implantation, the size 3 mobile bearing tibial  tray, size 3 posterior stabilized femur and 35 patella are cemented into  place.  The patella was held with the clamp.  Trial 10-mm inserts  placed, knee held in full extension.  All extruded cement removed.  Once   cement fully hardened, then the permanent 10 mm posterior stabilized  rotating platform insert is placed into the tibial tray.  Wound was  copiously irrigated with saline solution and the extensor mechanism  closed over Hemovac drain with interrupted #1 PDS.  Flexion against  gravity is 130 degrees.  Tourniquet released with total time of 38  minutes.  Subcu closed with interrupted 2-0 Vicryl, subcuticular running  4-0 Monocryl.  Incisions cleaned and dried and Steri-Strips and bulky  sterile dressing applied.  The drain was hooked to suction and she is  placed into a knee immobilizer, awakened and transported to recovery in  stable condition.      Ollen Gross, M.D.  Electronically Signed     FA/MEDQ  D:  09/10/2006  T:  09/10/2006  Job:  [id number]

## 2010-12-28 NOTE — Discharge Summary (Signed)
[name], [name]              ACCOUNT NO.:  [id number]   MEDICAL RECORD NO.:  [id number]          PATIENT TYPE:  INP   LOCATION:  1511                         [facility]ACILITY:  [facility]   PHYSICIAN:  Ollen Gross, M.D.    DATE OF BIRTH:  1931/04/01   DATE OF ADMISSION:  09/10/2006  DATE OF DISCHARGE:  09/14/2006                               DISCHARGE SUMMARY   ADMISSION DIAGNOSES:  1. Bilateral knees osteoarthritis, right greater than left.  2. Coronary arterial disease.  3. Angina.  4. Obesity.  5. History of meningioma brain tumor requiring cranioplasty.  6. History of AV malformation.  7. History of cerebrovascular accident with postoperative hemiparesis      following cranioplasty.  8. Hiatal hernia.  9. History of gastrointestinal bleed.   DISCHARGE DIAGNOSES:  1. Osteoarthritis right knee status post right total knee      arthroplasty.  2. Osteoarthritis left knee.  3. Postop acute blood loss anemia that did not require transfusion.  4. Postop hyponatremia, improved.  5. Postop hypokalemia, improved.  6. Coronary arterial disease.  7. Angina.  8. Obesity.  9. History of meningioma brain tumor requiring cranioplasty.  10.History of AV malformation.  11.History of cerebrovascular accident with postoperative hemiparesis      following cranioplasty.  12.Hiatal hernia.  13.History of gastrointestinal bleed.   PROCEDURE:  September 10, 2006, surgeon Dr. Lequita Halt, assistant Avel Peace, PA-C, spinal anesthesia.   CONSULTATIONS:  None.   BRIEF HISTORY:  [name] is a 77 year old female with end-stage  arthritis of the right knee with progressive worsening pain who failed  nonoperative management including injections and now presents for a  total knee arthroplasty.   LABORATORY DATA:  Preop CBC showed a hemoglobin of 13.7, hematocrit of  39.7, white cell count 5.6, postop hemoglobin of 12.1, drifted down to  10.9. Last noted H&H 9.5 and 27.0. PT and PTT preop 13.1 and  33  respectively. INR 1.0. Serial protimes followed, last noted PT/INR 20.1  and 1.6. Chem panel on admission all within normal limits. Serial BMETs  are as follows:  Sodium did drop from 136 to 132 back up to 135.  Potassium dropped from 4.1 to 3.2 back up 3.8. Preop UA negative. Blood  group type O positive.   EKG June 10, 2006 normal sinus rhythm, left axis deviation, septal  infarct, age undetermined, confirmed by Dr. Eduardo Osier. Harwani. EKG  September 03, 2006 normal sinus rhythm, left axis deviation, no change  since June 10, 2006, confirmed by Dr. Lady Deutscher.   Two-view chest September 03, 2006, no acute findings, large hiatal hernia.   HOSPITAL COURSE:  The patient admitted to [facility] and  tolerated procedure well, later transferred to the recovery room and  orthopedic floor and started on __________ PCA and p.o. medications. Had  a miserable night, complaints of back spasms, muscle relaxants  ordered. Given Mylanta and H2 blockers p.r.n. because of her hiatal  hernia. Seen in rounds by Dr. Lequita Halt, Hemovac drain was pulled.  Hemoglobin was 12.1 postop. Did have a low sodium and low potassium.  Given  potassium supplements and the fluids were changed, reduced rates,  started getting up out of bed. By day #2, she was doing much better,  pain was under better control, she looked more comfortable still having  spasms but under better control. Dressing was changed, incision looked  good. Potassium improved  a little bit. Sodium remained the same. Fluids  were discontinued. It was felt she might be able to go home over the  weekend. Discharge planning got involved. She continued to receive  therapy. By day #2, she was up ambulating about 40 feet and then  increased up to 50 feet and by the weekend of September 14, 2006 she was  up ambulating well, tolerating her medications, no complaints, incision  was healing well and was discharged home.   PLAN:  1. The patient was  discharged home on September 14, 2006.  2. Discharge diagnoses - please see above.  3. Discharge medications - Percocet, Robaxin, Coumadin.  4. Diet - resume home diet.  5. Activity - weightbear as tolerated. Total knee protocol. Home      health PT, home health nursing.  6. Diet - heart health diet.  7. Followup in 2 weeks. Call the office for an appointment.   DISPOSITION:  Home.   CONDITION ON DISCHARGE:  Improved.      Alexzandrew L. Julien Girt, P.A.      Ollen Gross, M.D.  Electronically Signed    ALP/MEDQ  D:  10/10/2006  T:  10/10/2006  Job:  [id number]   cc:   Neena Rhymes, M.D.

## 2011-01-28 ENCOUNTER — Telehealth: Payer: Self-pay | Admitting: Family Medicine

## 2011-01-28 NOTE — Telephone Encounter (Signed)
Called and discussed.  She is having good results with the medication and no side effects.  Given personality and age, abuse potential is quite small.  She will see me in the next month or so and I encouraged her to continue to take the medication.

## 2011-01-28 NOTE — Telephone Encounter (Signed)
Ms. Dowse have some concerns about the Adderall meds.  She want to know if it's habit forming.   Don't want something that's going to control her.

## 2011-02-22 ENCOUNTER — Encounter: Payer: Self-pay | Admitting: Family Medicine

## 2011-02-22 ENCOUNTER — Ambulatory Visit (INDEPENDENT_AMBULATORY_CARE_PROVIDER_SITE_OTHER): Payer: Medicare Other | Admitting: Family Medicine

## 2011-02-22 VITALS — BP 130/87 | HR 76 | Temp 98.4°F | Ht 65.0 in | Wt 215.9 lb

## 2011-02-22 DIAGNOSIS — F319 Bipolar disorder, unspecified: Secondary | ICD-10-CM

## 2011-02-22 DIAGNOSIS — F988 Other specified behavioral and emotional disorders with onset usually occurring in childhood and adolescence: Secondary | ICD-10-CM

## 2011-02-22 MED ORDER — AMPHETAMINE-DEXTROAMPHETAMINE 10 MG PO TABS: 10.0000 mg | ORAL_TABLET | Freq: Every day | ORAL | Status: DC

## 2011-02-22 NOTE — Progress Notes (Signed)
  Subjective:    Patient ID: [name], female    DOB: [birth year], 76 y.o.   MRN: [id number]  HPI Delighted with results of Adderal trial for depression.  She actually takes two 5 mg in the morning but not daily - uses about 3 days per week.  Discussed serious potential side effects. No seizures since meningioma removed 20 years ago.   No worsening of HBP No chest pain No history of mania    Review of Systems     Objective:   Physical Exam Lungs clear Cardiac RRR without m        Assessment & Plan:

## 2011-02-22 NOTE — Assessment & Plan Note (Signed)
Excellent response to adderal without side effects.  Will continue

## 2011-03-09 ENCOUNTER — Encounter: Payer: Self-pay | Admitting: Family Medicine

## 2011-03-22 ENCOUNTER — Other Ambulatory Visit: Payer: Self-pay | Admitting: Family Medicine

## 2011-03-22 DIAGNOSIS — E785 Hyperlipidemia, unspecified: Secondary | ICD-10-CM

## 2011-03-22 MED ORDER — ATORVASTATIN CALCIUM 10 MG PO TABS: 10.0000 mg | ORAL_TABLET | Freq: Every day | ORAL | Status: DC

## 2011-03-22 NOTE — Assessment & Plan Note (Signed)
Refilled med per fax request 

## 2011-03-25 ENCOUNTER — Other Ambulatory Visit: Payer: Self-pay | Admitting: Family Medicine

## 2011-03-25 NOTE — Telephone Encounter (Signed)
refiIll request

## 2011-05-02 LAB — CBC
HCT: 28.9 — ABNORMAL LOW
HCT: 33 — ABNORMAL LOW
HCT: 40
Hemoglobin: 11.6 — ABNORMAL LOW
Hemoglobin: 13.8
MCHC: 34.6
MCHC: 35
MCHC: 35.2
MCHC: 35.3
MCV: 89.5
MCV: 89.9
MCV: 89.9
Platelets: 173
Platelets: 174
Platelets: 217
RBC: 4.45
RDW: 12.9
RDW: 13.1
RDW: 13.3
WBC: 5.8

## 2011-05-02 LAB — URINALYSIS, ROUTINE W REFLEX MICROSCOPIC
Bilirubin Urine: NEGATIVE
Glucose, UA: NEGATIVE
Hgb urine dipstick: NEGATIVE
Ketones, ur: NEGATIVE
Nitrite: NEGATIVE
Protein, ur: NEGATIVE
Specific Gravity, Urine: 1.008
Urobilinogen, UA: 0.2
pH: 7

## 2011-05-02 LAB — COMPREHENSIVE METABOLIC PANEL
ALT: 20
AST: 26
Albumin: 4
Alkaline Phosphatase: 76
BUN: 13
CO2: 30
Calcium: 9.7
Chloride: 103
Creatinine, Ser: 0.68
GFR calc Af Amer: >60
GFR calc non Af Amer: >60
Glucose, Bld: 105 — ABNORMAL HIGH
Potassium: 5
Sodium: 138
Total Bilirubin: 1.1
Total Protein: 6.8

## 2011-05-02 LAB — BASIC METABOLIC PANEL
BUN: 10
BUN: 8
CO2: 26
CO2: 26
CO2: 30
Calcium: 8.1 — ABNORMAL LOW
Calcium: 8.8
Chloride: 98
Creatinine, Ser: 0.67
GFR calc non Af Amer: >60
Glucose, Bld: 107 — ABNORMAL HIGH
Glucose, Bld: 174 — ABNORMAL HIGH
Potassium: 3.3 — ABNORMAL LOW
Potassium: 3.5
Sodium: 131 — ABNORMAL LOW

## 2011-05-02 LAB — PROTIME-INR
Prothrombin Time: 12.8
Prothrombin Time: 15.9 — ABNORMAL HIGH

## 2011-05-02 LAB — TYPE AND SCREEN: ABO/RH(D): O POS

## 2011-05-22 ENCOUNTER — Other Ambulatory Visit: Payer: Self-pay | Admitting: Family Medicine

## 2011-05-22 NOTE — Telephone Encounter (Signed)
Refill request

## 2011-05-30 ENCOUNTER — Telehealth: Payer: Self-pay | Admitting: Family Medicine

## 2011-05-30 NOTE — Telephone Encounter (Signed)
Fax sheet filled out for surg clearance and given to nurse.

## 2011-05-30 NOTE — Telephone Encounter (Signed)
[name] is going to be having surgery on her calf muscles by Dr. Lestine Box.  Dr. Lestine Box office was supposed to call [name] to get the ok to do this surgery and she was calling to check on if this has happened.  The phone # is [phone] for Dr. Lestine Box office and Cordelia Pen is the contact person.  Ms. Mccay wants [name] to know that she is fine and she will schedule and appointment in a month unless he needs to see her sooner.

## 2011-05-30 NOTE — Telephone Encounter (Signed)
Faxed surgical clearnace form to [facility] wills). LVM informing patient of this also

## 2011-06-25 ENCOUNTER — Other Ambulatory Visit: Payer: Self-pay | Admitting: Family Medicine

## 2011-06-25 NOTE — Telephone Encounter (Signed)
Refill request

## 2011-06-28 ENCOUNTER — Ambulatory Visit (INDEPENDENT_AMBULATORY_CARE_PROVIDER_SITE_OTHER): Payer: Medicare Other | Admitting: Family Medicine

## 2011-06-28 ENCOUNTER — Encounter: Payer: Self-pay | Admitting: Family Medicine

## 2011-06-28 VITALS — BP 154/79 | HR 71 | Wt 213.0 lb

## 2011-06-28 DIAGNOSIS — E119 Type 2 diabetes mellitus without complications: Secondary | ICD-10-CM

## 2011-06-28 DIAGNOSIS — I10 Essential (primary) hypertension: Secondary | ICD-10-CM

## 2011-06-28 DIAGNOSIS — Z23 Encounter for immunization: Secondary | ICD-10-CM

## 2011-06-28 DIAGNOSIS — F319 Bipolar disorder, unspecified: Secondary | ICD-10-CM

## 2011-06-28 DIAGNOSIS — F988 Other specified behavioral and emotional disorders with onset usually occurring in childhood and adolescence: Secondary | ICD-10-CM

## 2011-06-28 DIAGNOSIS — E785 Hyperlipidemia, unspecified: Secondary | ICD-10-CM

## 2011-06-28 MED ORDER — ATORVASTATIN CALCIUM 10 MG PO TABS: 10.0000 mg | ORAL_TABLET | Freq: Every day | ORAL | Status: DC

## 2011-06-28 MED ORDER — AMPHETAMINE-DEXTROAMPHETAMINE 10 MG PO TABS: 10.0000 mg | ORAL_TABLET | Freq: Every day | ORAL | Status: DC

## 2011-06-28 NOTE — Assessment & Plan Note (Addendum)
Refill prozac.  No evidence of mania or hypomania.  Depression is not currently a problem

## 2011-06-28 NOTE — Patient Instructions (Signed)
Make sure you check your blood pressure.  I want to make sure it stays in good control. Good luck with your surgery. See me in three months.   Stay active and try to avoid the binge eating.

## 2011-06-28 NOTE — Assessment & Plan Note (Signed)
Despite today's elevated reading, I will trust she is well controled based on her home BP readings.

## 2011-06-28 NOTE — Assessment & Plan Note (Signed)
Per patient reports, extremely positive response to adderall therapy.

## 2011-06-28 NOTE — Progress Notes (Signed)
  Subjective:    Patient ID: [name], female    DOB: 01/18/1931, 77 y.o.   MRN: [id number]  HPI [name] remains delighted with the addition of adderall to her medical regimen.  She has more energy, sleeps well, and is MUCH less depressed than previously.  She states this is the best she has felt in years.  Because she carries the diagnosis of bipolar, I asked her about mania or hypomania symptoms.  Denies periods to excessive energy, shopping or spending sprees, she has not taken on any major new tasks, does not go any significant periods without sleep and has no delusions of grandeur.  BPs at home (she is a retired Engineer, civil (consulting)) have been good.  She is surprised by elevated BP in our office.  Denies chest pain, shortness of breath or leg swelling.  Review of Systems     Objective:   Physical Exam BP noted - but I will believe her reports of good home BPs Lungs clear Cardiac RRR without murmur. Ext no edema       Assessment & Plan:

## 2011-07-17 ENCOUNTER — Ambulatory Visit: Payer: Medicare Other | Admitting: Family Medicine

## 2011-08-19 ENCOUNTER — Other Ambulatory Visit: Payer: Self-pay | Admitting: Family Medicine

## 2011-08-19 NOTE — Telephone Encounter (Signed)
Refill request

## 2011-09-12 ENCOUNTER — Encounter: Payer: Self-pay | Admitting: Family Medicine

## 2011-09-12 ENCOUNTER — Ambulatory Visit (INDEPENDENT_AMBULATORY_CARE_PROVIDER_SITE_OTHER): Payer: Medicare Other | Admitting: Family Medicine

## 2011-09-12 DIAGNOSIS — I10 Essential (primary) hypertension: Secondary | ICD-10-CM | POA: Diagnosis not present

## 2011-09-12 MED ORDER — METOPROLOL SUCCINATE ER 25 MG PO TB24: 25.0000 mg | ORAL_TABLET | Freq: Every day | ORAL | Status: DC

## 2011-09-12 NOTE — Assessment & Plan Note (Addendum)
Review of home BP readings shows many diastolics >90.  Low dose metoprolol started today.  Pt to follow up in 2-3 weeks with PCP

## 2011-09-12 NOTE — Progress Notes (Signed)
Subjective: The patient is a 78 y.o. year old female who presents today for elevated blood pressure.  Pt did not sleep well last night. Pt decided to check blood pressure this morning due to dizziness.  bp was163/113 this AM.  Took bp med following that.  Pt also reports that she had poor vision at that time.  Felt that room was spinning.  Is currently walking OK although she still has a bit of a feeling of disequilibrium.  No chest pain/SOB/DOE.  Has some LE edema that is chronic.  Objective:  Filed Vitals:   09/12/11 0954  BP: 146/84  Pulse: 79   Gen: NAD CV: Regular, possible faint systolic murmur, otherwise no additional heart sounds Resp: CTABL Ext: Trace LE edema, non-pitting  Assessment/Plan:  Please also see individual problems in problem list for problem-specific plans.

## 2011-09-12 NOTE — Patient Instructions (Signed)
I want you to start taking the medicine metoprolol at night.  You are starting out at a low dose.  Come back in to see Dr. Leveda Anna sometime in the next 3-4 weeks to see him about your blood pressure.

## 2011-09-20 ENCOUNTER — Telehealth: Payer: Self-pay | Admitting: Family Medicine

## 2011-09-20 MED ORDER — ASPIRIN 81 MG PO TBEC: 81.0000 mg | DELAYED_RELEASE_TABLET | Freq: Every day | ORAL | Status: AC

## 2011-09-20 NOTE — Telephone Encounter (Signed)
Will forward message to Dr. Leveda Anna to please advise.

## 2011-09-20 NOTE — Telephone Encounter (Signed)
Patient was put on metoprolol and was told to take it at night.  It is not doing well because it makes her have to get up to go to the bathroom every 2 hours.  Wants to know if she can take it in the morning instead.  Also was told to take a baby Asperin at night and wants to take that in the morning also because she takes 4 Ibuprofen at night and is afraid to take them together.  The Ibuprofen help with her arthritis at night. Please advise  PS- please tell Dr Leveda Anna that she loves him!  :)

## 2011-09-20 NOTE — Telephone Encounter (Signed)
OK to take in the morning.

## 2011-09-26 DIAGNOSIS — M5137 Other intervertebral disc degeneration, lumbosacral region: Secondary | ICD-10-CM | POA: Diagnosis not present

## 2011-10-02 ENCOUNTER — Encounter: Payer: Self-pay | Admitting: Family Medicine

## 2011-10-02 ENCOUNTER — Ambulatory Visit (INDEPENDENT_AMBULATORY_CARE_PROVIDER_SITE_OTHER): Payer: Medicare Other | Admitting: Family Medicine

## 2011-10-02 VITALS — BP 144/92 | Temp 98.2°F | Ht 65.0 in | Wt 215.0 lb

## 2011-10-02 DIAGNOSIS — I10 Essential (primary) hypertension: Secondary | ICD-10-CM | POA: Diagnosis not present

## 2011-10-02 LAB — CBC
MCHC: 31.7 g/dL (ref 30.0–36.0)
MCV: 92.5 fL (ref 78.0–100.0)
Platelets: 268 10*3/uL (ref 150–400)
RDW: 13.8 % (ref 11.5–15.5)
WBC: 7.5 10*3/uL (ref 4.0–10.5)

## 2011-10-02 MED ORDER — METOPROLOL SUCCINATE ER 50 MG PO TB24: 50.0000 mg | ORAL_TABLET | Freq: Every day | ORAL | Status: DC

## 2011-10-02 NOTE — Patient Instructions (Addendum)
Stay off the adderall until we know we have good control of your blood pressure. I sent a new prescription for a higher dose of the metoprolol.  You can use the medicine you have by just taken two pills daily. When you start up the adderall again, only take a half pill daily. See me in 4-6 weeks.  I want to make sure your blood pressure is good. Good luck with your foot surgery. Stop the daily aspirin five days before your foot surgery.

## 2011-10-03 ENCOUNTER — Encounter: Payer: Self-pay | Admitting: Family Medicine

## 2011-10-03 DIAGNOSIS — M5137 Other intervertebral disc degeneration, lumbosacral region: Secondary | ICD-10-CM | POA: Diagnosis not present

## 2011-10-03 LAB — COMPLETE METABOLIC PANEL WITH GFR
ALT: 15 U/L (ref 0–35)
AST: 25 U/L (ref 0–37)
Alkaline Phosphatase: 89 U/L (ref 39–117)
BUN: 30 mg/dL — ABNORMAL HIGH (ref 6–23)
Calcium: 9.9 mg/dL (ref 8.4–10.5)
Chloride: 102 mEq/L (ref 96–112)
Creat: 0.72 mg/dL (ref 0.50–1.10)
Total Bilirubin: 0.5 mg/dL (ref 0.3–1.2)

## 2011-10-03 NOTE — Progress Notes (Signed)
  Subjective:    Patient ID: [name], female    DOB: 07-21-1931, 78 y.o.   MRN: [id number]  HPI  [name] is generally doing well.  She faces some upcoming foot surgery and is concerned that her blood pressure has been up.  She has not been taking her adderall due to concern over the blood pressure.  She also will need medical clearance for the surgery.  She has no CP, SOB, syncope, unusual bleeding or change in wt.  Spirits are good and she feels like she is emotionally in a good place.    Review of Systems     Objective:   Physical Exam HEENT nl Neck normal Lungs clear Cardiac RRR without m or g Abd benign Ext no edema       Assessment & Plan:

## 2011-10-03 NOTE — Assessment & Plan Note (Signed)
Will bump metoprolol since she is not yet at goal. Also check labs for BP and upcoming surgery.

## 2011-10-04 ENCOUNTER — Ambulatory Visit: Payer: Medicare Other | Admitting: Family Medicine

## 2011-10-12 ENCOUNTER — Other Ambulatory Visit: Payer: Self-pay | Admitting: Family Medicine

## 2011-10-13 NOTE — Telephone Encounter (Signed)
Refill request

## 2011-10-18 DIAGNOSIS — M2010 Hallux valgus (acquired), unspecified foot: Secondary | ICD-10-CM | POA: Diagnosis not present

## 2011-10-18 DIAGNOSIS — M2040 Other hammer toe(s) (acquired), unspecified foot: Secondary | ICD-10-CM | POA: Diagnosis not present

## 2011-10-24 DIAGNOSIS — M5137 Other intervertebral disc degeneration, lumbosacral region: Secondary | ICD-10-CM | POA: Diagnosis not present

## 2011-11-01 ENCOUNTER — Encounter: Payer: Self-pay | Admitting: Home Health Services

## 2011-11-13 ENCOUNTER — Ambulatory Visit (INDEPENDENT_AMBULATORY_CARE_PROVIDER_SITE_OTHER): Payer: Medicare Other | Admitting: Family Medicine

## 2011-11-13 ENCOUNTER — Encounter: Payer: Self-pay | Admitting: Family Medicine

## 2011-11-13 VITALS — BP 130/70 | HR 72 | Temp 98.0°F | Wt 218.0 lb

## 2011-11-13 DIAGNOSIS — F988 Other specified behavioral and emotional disorders with onset usually occurring in childhood and adolescence: Secondary | ICD-10-CM

## 2011-11-13 DIAGNOSIS — I10 Essential (primary) hypertension: Secondary | ICD-10-CM | POA: Diagnosis not present

## 2011-11-13 DIAGNOSIS — F319 Bipolar disorder, unspecified: Secondary | ICD-10-CM | POA: Diagnosis not present

## 2011-11-13 DIAGNOSIS — M810 Age-related osteoporosis without current pathological fracture: Secondary | ICD-10-CM | POA: Insufficient documentation

## 2011-11-13 DIAGNOSIS — Z13820 Encounter for screening for osteoporosis: Secondary | ICD-10-CM | POA: Diagnosis not present

## 2011-11-13 NOTE — Patient Instructions (Signed)
Please keep on all your medications.   Keep an eye on your blood pressure See me in 3-6 months if things are going well Sooner if things are not going well Remember your promise

## 2011-11-13 NOTE — Progress Notes (Signed)
  Subjective:    Patient ID: [name], female    DOB: 06-01-1931, 78 y.o.   MRN: [id number]  HPI  Here for BP recheck.  Doing well on current meds.  Continues to take adderall on an irregular basis.  Is please with her current prn usage. No complaints of depression.    Review of Systems     Objective:   Physical Exam Cardiac RRR.       Assessment & Plan:

## 2011-11-14 ENCOUNTER — Telehealth: Payer: Self-pay | Admitting: *Deleted

## 2011-11-14 NOTE — Assessment & Plan Note (Addendum)
No evidence of mania on adderall.  Depression is well controled.  I recognize this is atypical therapy and am monitoring closely

## 2011-11-14 NOTE — Assessment & Plan Note (Signed)
Now well controled 

## 2011-11-14 NOTE — Assessment & Plan Note (Signed)
Does well on adderall

## 2011-11-14 NOTE — Telephone Encounter (Signed)
Spoke with patient and informed her of DEXA scan appointment with Dr. Yolanda Bonine @ [facility]. Monday 11/18/2011 @ 9:30am. Phone number is [phone]. Patient agreed to this appointment and I faxed orders to them @ [phone].[name]

## 2011-11-15 ENCOUNTER — Other Ambulatory Visit: Payer: Self-pay | Admitting: Family Medicine

## 2011-11-18 DIAGNOSIS — Z13820 Encounter for screening for osteoporosis: Secondary | ICD-10-CM | POA: Diagnosis not present

## 2011-11-18 DIAGNOSIS — Z1231 Encounter for screening mammogram for malignant neoplasm of breast: Secondary | ICD-10-CM | POA: Diagnosis not present

## 2011-11-20 ENCOUNTER — Encounter: Payer: Self-pay | Admitting: Family Medicine

## 2011-11-25 DIAGNOSIS — M2040 Other hammer toe(s) (acquired), unspecified foot: Secondary | ICD-10-CM | POA: Diagnosis not present

## 2011-11-27 ENCOUNTER — Telehealth: Payer: Self-pay | Admitting: Family Medicine

## 2011-11-27 NOTE — Telephone Encounter (Signed)
Patient requesting copy of report for bone density test.  Mail to patient when printed out.

## 2011-11-28 NOTE — Telephone Encounter (Signed)
Informed that I do not have a copy - that I discarded the copy after I entered the results of the bone density.  Advised her that radiologist could provide a copy.

## 2011-12-05 ENCOUNTER — Telehealth: Payer: Self-pay | Admitting: Family Medicine

## 2011-12-05 NOTE — Telephone Encounter (Signed)
Patient is calling because she wants to get Dr. Cyndia Diver opinion about a [employer] study.

## 2011-12-05 NOTE — Telephone Encounter (Signed)
Patient has questions about a celabrex blind study that was sent to her house. She said that it does have a perk of $75 per visit with 9 visits. She got a letter about this in the mail and a reason to get this or qualify  For this is a dx of osteoarthritis & heart disease or risk for it. Patient is suspicious about this because it says that they have to have records to verify this and she is wondering where/from where this has come from. She would like to speak with you about some of these questions, before she goes and do this.

## 2011-12-06 NOTE — Telephone Encounter (Signed)
Patient has decided to not participate in the study.  We did discuss that ibuprofen is not problem free.  She will make an appointment to discuss other ways to treat her pain.

## 2011-12-13 ENCOUNTER — Ambulatory Visit: Payer: Medicare Other | Admitting: Family Medicine

## 2011-12-16 DIAGNOSIS — M2010 Hallux valgus (acquired), unspecified foot: Secondary | ICD-10-CM | POA: Diagnosis not present

## 2011-12-16 DIAGNOSIS — M2040 Other hammer toe(s) (acquired), unspecified foot: Secondary | ICD-10-CM | POA: Diagnosis not present

## 2012-01-01 DIAGNOSIS — M47817 Spondylosis without myelopathy or radiculopathy, lumbosacral region: Secondary | ICD-10-CM | POA: Diagnosis not present

## 2012-01-10 ENCOUNTER — Other Ambulatory Visit: Payer: Self-pay | Admitting: Family Medicine

## 2012-01-14 ENCOUNTER — Telehealth: Payer: Self-pay | Admitting: Family Medicine

## 2012-01-14 NOTE — Telephone Encounter (Signed)
Pt has poison ivy on her arms and needs for Dr Leveda Anna to call in Prednisone - is refusing to come in.  [facility]

## 2012-01-15 ENCOUNTER — Other Ambulatory Visit: Payer: Self-pay | Admitting: Family Medicine

## 2012-01-15 MED ORDER — PREDNISONE 20 MG PO TABS: 20.0000 mg | ORAL_TABLET | Freq: Every day | ORAL | Status: AC

## 2012-01-15 NOTE — Telephone Encounter (Signed)
Discussed poison ivy.

## 2012-01-17 ENCOUNTER — Telehealth: Payer: Self-pay | Admitting: Family Medicine

## 2012-01-17 NOTE — Telephone Encounter (Signed)
Pt is still having problems with itching and thinks she needs to be referred to Dr Londell Moh (derm) Needs to go asap.  pls advise

## 2012-01-17 NOTE — Telephone Encounter (Signed)
Called and informing pt of appt on Thursday June 13,2013 @ 315 PM with Dr. Donzetta Starch at [facility] [address] [place]. [phone]. Pt agreed to this appt.Loralee Pacas [place]

## 2012-01-18 ENCOUNTER — Telehealth: Payer: Self-pay | Admitting: Family Medicine

## 2012-01-18 MED ORDER — TRIAMCINOLONE ACETONIDE 0.1 % EX CREA: TOPICAL_CREAM | Freq: Two times a day (BID) | CUTANEOUS | Status: DC

## 2012-01-18 NOTE — Telephone Encounter (Signed)
Patient had what she described as poison ivy earlier this week.  Placed on Prednisone and using OTC hydrocortisone, but without relief.  Reports spreading of red papules scattered across upper extremities and thighs.  No blisters, no weeping areas.  Itching is "uncontrollable" and unaffected by Prednisone.   She would like steroid cream called in.    Discussed with her that I am not sure what is causing her itching since I cannot examine rash.  Recommended she go to Urgent Care for further evaluation.  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 Leveda Anna because he treats her as an equal.  Discussed with her that I can send in Triamcinolone cream as she does not have any open or weeping wounds/areas but that she should come in to be examined on Monday at Clinic.  Patient agreed with plan.

## 2012-01-23 DIAGNOSIS — L259 Unspecified contact dermatitis, unspecified cause: Secondary | ICD-10-CM | POA: Diagnosis not present

## 2012-02-03 DIAGNOSIS — M2040 Other hammer toe(s) (acquired), unspecified foot: Secondary | ICD-10-CM | POA: Diagnosis not present

## 2012-02-08 DIAGNOSIS — M47817 Spondylosis without myelopathy or radiculopathy, lumbosacral region: Secondary | ICD-10-CM | POA: Diagnosis not present

## 2012-02-20 ENCOUNTER — Other Ambulatory Visit: Payer: Self-pay | Admitting: Family Medicine

## 2012-02-25 DIAGNOSIS — M47817 Spondylosis without myelopathy or radiculopathy, lumbosacral region: Secondary | ICD-10-CM | POA: Diagnosis not present

## 2012-03-08 ENCOUNTER — Other Ambulatory Visit: Payer: Self-pay | Admitting: Family Medicine

## 2012-03-09 ENCOUNTER — Telehealth: Payer: Self-pay | Admitting: Family Medicine

## 2012-03-09 ENCOUNTER — Other Ambulatory Visit: Payer: Self-pay | Admitting: Family Medicine

## 2012-03-09 NOTE — Telephone Encounter (Signed)
Discussed with patient and refill authorized

## 2012-03-09 NOTE — Telephone Encounter (Signed)
Returned call to patient and informed she will need office visit to evaluate rash.  Patient states "it is a waste of taxpayers' dollars" because she misplaced triamcinolone medication.  Enjoys working outside due to her depression and was cutting weeds. 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  Also, requesting tube bigger that the last one she received because she weighs 200 lbs and wants to make sure she has enough med.  Will route request to Dr. Leveda Anna and call patient back.  Gaylene Brooks, RN

## 2012-03-09 NOTE — Telephone Encounter (Signed)
Pt has chiggers again and is needing something to help with this - needs something stronger than 1% itch relief

## 2012-03-11 DIAGNOSIS — M47817 Spondylosis without myelopathy or radiculopathy, lumbosacral region: Secondary | ICD-10-CM | POA: Diagnosis not present

## 2012-03-13 ENCOUNTER — Encounter: Payer: Self-pay | Admitting: Family Medicine

## 2012-03-24 ENCOUNTER — Emergency Department (HOSPITAL_COMMUNITY): Payer: Medicare Other

## 2012-03-24 ENCOUNTER — Encounter (HOSPITAL_COMMUNITY): Payer: Self-pay | Admitting: *Deleted

## 2012-03-24 ENCOUNTER — Inpatient Hospital Stay (HOSPITAL_COMMUNITY)
Admission: EM | Admit: 2012-03-24 | Discharge: 2012-03-27 | DRG: 872 | Disposition: A | Discharge status: Home / Self Care | Payer: Medicare Other | Attending: Family Medicine | Admitting: Family Medicine

## 2012-03-24 ENCOUNTER — Telehealth: Payer: Self-pay | Admitting: Family Medicine

## 2012-03-24 DIAGNOSIS — E876 Hypokalemia: Secondary | ICD-10-CM | POA: Diagnosis not present

## 2012-03-24 DIAGNOSIS — R6889 Other general symptoms and signs: Secondary | ICD-10-CM | POA: Diagnosis not present

## 2012-03-24 DIAGNOSIS — R7989 Other specified abnormal findings of blood chemistry: Secondary | ICD-10-CM | POA: Diagnosis not present

## 2012-03-24 DIAGNOSIS — I214 Non-ST elevation (NSTEMI) myocardial infarction: Secondary | ICD-10-CM

## 2012-03-24 DIAGNOSIS — N12 Tubulo-interstitial nephritis, not specified as acute or chronic: Secondary | ICD-10-CM | POA: Diagnosis present

## 2012-03-24 DIAGNOSIS — Z8249 Family history of ischemic heart disease and other diseases of the circulatory system: Secondary | ICD-10-CM | POA: Diagnosis not present

## 2012-03-24 DIAGNOSIS — K5732 Diverticulitis of large intestine without perforation or abscess without bleeding: Secondary | ICD-10-CM | POA: Diagnosis present

## 2012-03-24 DIAGNOSIS — I10 Essential (primary) hypertension: Secondary | ICD-10-CM | POA: Diagnosis present

## 2012-03-24 DIAGNOSIS — R109 Unspecified abdominal pain: Secondary | ICD-10-CM | POA: Diagnosis not present

## 2012-03-24 DIAGNOSIS — K5730 Diverticulosis of large intestine without perforation or abscess without bleeding: Secondary | ICD-10-CM | POA: Diagnosis not present

## 2012-03-24 DIAGNOSIS — I248 Other forms of acute ischemic heart disease: Secondary | ICD-10-CM | POA: Diagnosis present

## 2012-03-24 DIAGNOSIS — N390 Urinary tract infection, site not specified: Secondary | ICD-10-CM

## 2012-03-24 DIAGNOSIS — F988 Other specified behavioral and emotional disorders with onset usually occurring in childhood and adolescence: Secondary | ICD-10-CM | POA: Diagnosis present

## 2012-03-24 DIAGNOSIS — A419 Sepsis, unspecified organism: Secondary | ICD-10-CM | POA: Diagnosis present

## 2012-03-24 DIAGNOSIS — M899 Disorder of bone, unspecified: Secondary | ICD-10-CM | POA: Diagnosis present

## 2012-03-24 DIAGNOSIS — N10 Acute tubulo-interstitial nephritis: Secondary | ICD-10-CM | POA: Diagnosis not present

## 2012-03-24 DIAGNOSIS — E785 Hyperlipidemia, unspecified: Secondary | ICD-10-CM | POA: Diagnosis present

## 2012-03-24 DIAGNOSIS — I2489 Other forms of acute ischemic heart disease: Secondary | ICD-10-CM | POA: Diagnosis present

## 2012-03-24 DIAGNOSIS — R112 Nausea with vomiting, unspecified: Secondary | ICD-10-CM | POA: Diagnosis not present

## 2012-03-24 DIAGNOSIS — K5900 Constipation, unspecified: Secondary | ICD-10-CM | POA: Diagnosis present

## 2012-03-24 DIAGNOSIS — F319 Bipolar disorder, unspecified: Secondary | ICD-10-CM | POA: Diagnosis present

## 2012-03-24 DIAGNOSIS — F339 Major depressive disorder, recurrent, unspecified: Secondary | ICD-10-CM | POA: Diagnosis present

## 2012-03-24 DIAGNOSIS — M949 Disorder of cartilage, unspecified: Secondary | ICD-10-CM | POA: Diagnosis present

## 2012-03-24 DIAGNOSIS — I2510 Atherosclerotic heart disease of native coronary artery without angina pectoris: Secondary | ICD-10-CM

## 2012-03-24 DIAGNOSIS — R1032 Left lower quadrant pain: Secondary | ICD-10-CM | POA: Diagnosis not present

## 2012-03-24 DIAGNOSIS — IMO0001 Reserved for inherently not codable concepts without codable children: Secondary | ICD-10-CM

## 2012-03-24 HISTORY — DX: Diaphragmatic hernia without obstruction or gangrene: K44.9

## 2012-03-24 HISTORY — DX: Bipolar disorder, unspecified: F31.9

## 2012-03-24 HISTORY — DX: Obesity, unspecified: E66.9

## 2012-03-24 HISTORY — DX: Unspecified ovarian cyst, unspecified side: N83.209

## 2012-03-24 HISTORY — DX: Essential (primary) hypertension: I10

## 2012-03-24 HISTORY — DX: Arteriovenous malformation, site unspecified: Q27.30

## 2012-03-24 HISTORY — DX: Gastrointestinal hemorrhage, unspecified: K92.2

## 2012-03-24 HISTORY — DX: Hyperlipidemia, unspecified: E78.5

## 2012-03-24 HISTORY — DX: Unspecified convulsions: R56.9

## 2012-03-24 LAB — COMPREHENSIVE METABOLIC PANEL
ALT: 13 U/L (ref 0–35)
AST: 20 U/L (ref 0–37)
Albumin: 3.6 g/dL (ref 3.5–5.2)
Alkaline Phosphatase: 82 U/L (ref 39–117)
CO2: 21 mEq/L (ref 19–32)
Chloride: 101 mEq/L (ref 96–112)
Creatinine, Ser: 0.79 mg/dL (ref 0.50–1.10)
GFR calc non Af Amer: 77 mL/min — ABNORMAL LOW (ref >90)
Potassium: 2.9 mEq/L — ABNORMAL LOW (ref 3.5–5.1)
Total Bilirubin: 1 mg/dL (ref 0.3–1.2)

## 2012-03-24 LAB — URINALYSIS, ROUTINE W REFLEX MICROSCOPIC
Bilirubin Urine: NEGATIVE
Glucose, UA: NEGATIVE mg/dL
Ketones, ur: NEGATIVE mg/dL
Protein, ur: 100 mg/dL — AB
pH: 6 (ref 5.0–8.0)

## 2012-03-24 LAB — CBC WITH DIFFERENTIAL/PLATELET
Basophils Absolute: 0 10*3/uL (ref 0.0–0.1)
HCT: 38.9 % (ref 36.0–46.0)
Hemoglobin: 13.7 g/dL (ref 12.0–15.0)
Lymphocytes Relative: 3 % — ABNORMAL LOW (ref 12–46)
Monocytes Absolute: 1.5 10*3/uL — ABNORMAL HIGH (ref 0.1–1.0)
Monocytes Relative: 12 % (ref 3–12)
Neutro Abs: 11.2 10*3/uL — ABNORMAL HIGH (ref 1.7–7.7)
Neutrophils Relative %: 85 % — ABNORMAL HIGH (ref 43–77)
RDW: 13.4 % (ref 11.5–15.5)
WBC: 13.2 10*3/uL — ABNORMAL HIGH (ref 4.0–10.5)

## 2012-03-24 LAB — CARDIAC PANEL(CRET KIN+CKTOT+MB+TROPI)
CK, MB: 2.8 ng/mL (ref 0.3–4.0)
Relative Index: 2.5 (ref 0.0–2.5)
Total CK: 117 U/L (ref 7–177)
Troponin I: 0.37 ng/mL (ref <0.30)

## 2012-03-24 LAB — POCT I-STAT 3, VENOUS BLOOD GAS (G3P V)
Bicarbonate: 22.7 mEq/L (ref 20.0–24.0)
O2 Saturation: 96 %
TCO2: 24 mmol/L (ref 0–100)

## 2012-03-24 LAB — POCT I-STAT TROPONIN I
Troponin i, poc: 0.09 ng/mL (ref 0.00–0.08)
Troponin i, poc: 0.14 ng/mL (ref 0.00–0.08)

## 2012-03-24 LAB — PROTIME-INR: INR: 1.12 (ref 0.00–1.49)

## 2012-03-24 LAB — OCCULT BLOOD, POC DEVICE: Fecal Occult Bld: NEGATIVE

## 2012-03-24 LAB — URINE MICROSCOPIC-ADD ON

## 2012-03-24 LAB — CREATININE, SERUM
Creatinine, Ser: 1 mg/dL (ref 0.50–1.10)
GFR calc Af Amer: 60 mL/min — ABNORMAL LOW (ref >90)
GFR calc non Af Amer: 52 mL/min — ABNORMAL LOW (ref >90)

## 2012-03-24 LAB — LACTIC ACID, PLASMA: Lactic Acid, Venous: 2.4 mmol/L — ABNORMAL HIGH (ref 0.5–2.2)

## 2012-03-24 MED ORDER — HEPARIN SODIUM (PORCINE) 5000 UNIT/ML IJ SOLN
5000.0000 [IU] | Freq: Three times a day (TID) | INTRAMUSCULAR | Status: DC
  Administered 2012-03-24: 5000 [IU] via SUBCUTANEOUS
  Filled 2012-03-24 (×2): qty 1

## 2012-03-24 MED ORDER — ONDANSETRON HCL 4 MG/2ML IJ SOLN
4.0000 mg | Freq: Once | INTRAMUSCULAR | Status: AC
  Administered 2012-03-24: 4 mg via INTRAVENOUS
  Filled 2012-03-24: qty 2

## 2012-03-24 MED ORDER — ONDANSETRON HCL 4 MG PO TABS: 4.0000 mg | ORAL_TABLET | Freq: Four times a day (QID) | ORAL | Status: DC | PRN

## 2012-03-24 MED ORDER — METRONIDAZOLE IN NACL 5-0.79 MG/ML-% IV SOLN
500.0000 mg | Freq: Once | INTRAVENOUS | Status: AC
  Administered 2012-03-24: 500 mg via INTRAVENOUS
  Filled 2012-03-24: qty 100

## 2012-03-24 MED ORDER — ACETAMINOPHEN 650 MG RE SUPP: 650.0000 mg | Freq: Four times a day (QID) | RECTAL | Status: DC | PRN

## 2012-03-24 MED ORDER — POTASSIUM CHLORIDE CRYS ER 20 MEQ PO TBCR
20.0000 meq | EXTENDED_RELEASE_TABLET | Freq: Three times a day (TID) | ORAL | Status: DC
  Administered 2012-03-24: 20 meq via ORAL
  Filled 2012-03-24 (×3): qty 1

## 2012-03-24 MED ORDER — SODIUM CHLORIDE 0.9 % IV SOLN
1000.0000 mL | INTRAVENOUS | Status: DC
  Administered 2012-03-24: 1000 mL via INTRAVENOUS

## 2012-03-24 MED ORDER — POTASSIUM CHLORIDE 10 MEQ/100ML IV SOLN
10.0000 meq | INTRAVENOUS | Status: DC
  Administered 2012-03-24 (×2): 10 meq via INTRAVENOUS
  Filled 2012-03-24 (×2): qty 100

## 2012-03-24 MED ORDER — ACETAMINOPHEN 325 MG PO TABS
650.0000 mg | ORAL_TABLET | Freq: Four times a day (QID) | ORAL | Status: DC | PRN
  Filled 2012-03-24 (×4): qty 2

## 2012-03-24 MED ORDER — CIPROFLOXACIN IN D5W 400 MG/200ML IV SOLN
400.0000 mg | Freq: Two times a day (BID) | INTRAVENOUS | Status: DC
  Administered 2012-03-25 – 2012-03-26 (×2): 400 mg via INTRAVENOUS
  Filled 2012-03-24 (×4): qty 200

## 2012-03-24 MED ORDER — ASPIRIN 81 MG PO CHEW
324.0000 mg | CHEWABLE_TABLET | Freq: Once | ORAL | Status: AC
  Administered 2012-03-24: 324 mg via ORAL
  Filled 2012-03-24: qty 4

## 2012-03-24 MED ORDER — SODIUM CHLORIDE 0.9 % IJ SOLN
3.0000 mL | Freq: Two times a day (BID) | INTRAMUSCULAR | Status: DC
  Administered 2012-03-25 – 2012-03-27 (×4): 3 mL via INTRAVENOUS

## 2012-03-24 MED ORDER — CIPROFLOXACIN IN D5W 400 MG/200ML IV SOLN
400.0000 mg | Freq: Once | INTRAVENOUS | Status: AC
  Administered 2012-03-24: 400 mg via INTRAVENOUS
  Filled 2012-03-24: qty 200

## 2012-03-24 MED ORDER — POLYETHYLENE GLYCOL 3350 17 G PO PACK
17.0000 g | PACK | Freq: Every day | ORAL | Status: DC | PRN
  Administered 2012-03-26: 17 g via ORAL
  Filled 2012-03-24: qty 1

## 2012-03-24 MED ORDER — MORPHINE SULFATE 2 MG/ML IJ SOLN
2.0000 mg | INTRAMUSCULAR | Status: DC | PRN
  Administered 2012-03-24: 2 mg via INTRAVENOUS
  Filled 2012-03-24: qty 1

## 2012-03-24 MED ORDER — IOHEXOL 300 MG/ML  SOLN
100.0000 mL | Freq: Once | INTRAMUSCULAR | Status: AC | PRN
  Administered 2012-03-24: 100 mL via INTRAVENOUS

## 2012-03-24 MED ORDER — SODIUM CHLORIDE 0.9 % IV SOLN
INTRAVENOUS | Status: DC
  Administered 2012-03-24 – 2012-03-25 (×3): via INTRAVENOUS

## 2012-03-24 MED ORDER — MORPHINE SULFATE 4 MG/ML IJ SOLN
2.0000 mg | INTRAMUSCULAR | Status: DC | PRN
  Administered 2012-03-24 (×2): 2 mg via INTRAVENOUS
  Filled 2012-03-24 (×2): qty 1

## 2012-03-24 MED ORDER — LACTATED RINGERS IV SOLN
INTRAVENOUS | Status: DC
  Administered 2012-03-24: 11:00:00 via INTRAVENOUS

## 2012-03-24 MED ORDER — OXYCODONE-ACETAMINOPHEN 5-325 MG PO TABS
1.0000 | ORAL_TABLET | Freq: Once | ORAL | Status: DC
  Filled 2012-03-24: qty 1

## 2012-03-24 MED ORDER — ONDANSETRON HCL 4 MG/2ML IJ SOLN
4.0000 mg | Freq: Four times a day (QID) | INTRAMUSCULAR | Status: DC | PRN
  Administered 2012-03-26: 4 mg via INTRAVENOUS
  Filled 2012-03-24: qty 2

## 2012-03-24 MED ORDER — IOHEXOL 300 MG/ML  SOLN
20.0000 mL | INTRAMUSCULAR | Status: AC
  Administered 2012-03-24 (×2): 20 mL via ORAL

## 2012-03-24 MED ORDER — CIPROFLOXACIN IN D5W 400 MG/200ML IV SOLN
400.0000 mg | Freq: Two times a day (BID) | INTRAVENOUS | Status: DC
  Filled 2012-03-24 (×2): qty 200

## 2012-03-24 MED ORDER — POTASSIUM CHLORIDE 10 MEQ/100ML IV SOLN
INTRAVENOUS | Status: AC
  Filled 2012-03-24: qty 100

## 2012-03-24 NOTE — Telephone Encounter (Signed)
Forwarded to pcp.[name]  

## 2012-03-24 NOTE — ED Notes (Signed)
Admitting MD at bedside.

## 2012-03-24 NOTE — Progress Notes (Signed)
1815 Patient arrived to floor from ED via stretcher placed on telemetry NSR rate 79. Skin WNL , patient placed on bed alarm.

## 2012-03-24 NOTE — ED Notes (Signed)
Angela Adam (neighbor) [phone]

## 2012-03-24 NOTE — ED Notes (Signed)
Wentz, MD notified of abnormal lab test results 

## 2012-03-24 NOTE — ED Provider Notes (Signed)
  Assumed care of the patient in the CDU from Dr. Rhunette Croft.  Patient presents today with a chief complaint of abdominal pain, nausea, and vomiting.  Patient is currently awaiting a CT of her abdomen and pelvis.  Patient was also found to have an indeterminate troponin.  I stat troponin 0.9.  Plan is for the patient to have a 3 hour troponin.  EKG did not have any acute findings of ischemia.  Patient denies any chest pain at this time.  Reassessed patient.  She reports that her pain and her nausea are controlled at this time.  No chest pain.  Patient alert and orientated x 3, No acute distress, Heart RRR, Lungs CTAB, Abdomen soft with generalized tenderness to palpation, particularly the LLQ and suprapubic.  No rebound or guarding.     2:10 PM CT ab/pelvis results as follows: Mild inflammatory changes along the left pericolic gutter and along  the left lateral perinephric in the perirenal spaces. There are  diverticula along the descending sigmoid colon. This could reflect  a mild uncomplicated descending colon diverticulitis. Consider  pyelonephritis in the proper clinical setting. No abscess or  extraluminal air. No other evidence of an acute abnormality.  Started patient on Cipro and Flagyl.  Discussed elevated Troponin with Dr. Rhunette Croft.  He recommends ordering Aspirin for the patient.  Patient ordered 324 mg Aspirin.  However, patient continues to deny chest pain.  No shortness of breath.    2:18 PM Discussed with Family Medicine.  They report that they will come evaluate the patient and admit.    Pascal Lux [place], PA-C 03/24/12 1452

## 2012-03-24 NOTE — ED Notes (Signed)
Report received, assumed care.  

## 2012-03-24 NOTE — Progress Notes (Signed)
1800 Lab called troponin  0.37 Dr. Adriana Simas made aware of lab value.

## 2012-03-24 NOTE — ED Notes (Signed)
Report given to CDU ED, RN

## 2012-03-24 NOTE — H&P (Signed)
Patient ID: [name], female   DOB: June 23, 1931, 78 y.o.   MRN: [id number] [facility] Admission History and Physical Service Pager: [phone]  Patient name: [name] Medical record number: [id number] Date of birth: [birth year] Age: 78 y.o. Gender: female  Primary Care Provider: Sanjuana Letters, MD  Chief Complaint: LLQ abdominal pain, fever, vomiting  Assessment and Plan: Darolyn Double is a 78 y.o. year old female presenting with three days of sharp LLQ pain with fever and chills and one day of bilious vomiting.  1. LLQ pain: likely pyelonephritis due to UA results, less likely diverticulitis due to only mild CT changes, negative fecal occult blood test, and stable hemoglobin.  -continue ciprofloxacin 400 ml IV for pyelonephritis as patient has a PCN allergy  -d/c flagyl because less likely diverticulitis  -morphine 2mg  q1 prn for pain because is nauseous, will transition to percocet when tolerating PO  -f/u urine cx and gram stain  -blood cx if fever >101  -tylenol as needed if fever >101  2. Positive iSTAT troponins: MI unlikely due to lack of chest pain; EKG showed sinus rhythm and RAD with no ST elevation  -will cycle cardiac enzymes x3 q8 hrs  -monitor on telemetry  -continue home aspirin 81 mg  -recheck labs and EKG in AM  -will not consult cards at this time but will closely monitor and have low threshold for consult  depending on troponins and EKG  3. Bipolar disorder: pt appears stable, will continue home fluoxetine 60mg  daily 4. Hyperlipidemia: continue atorvastatin 5. Hypokalemia: received 2 runs of K in the ED.  -give potassium PO 20 mg x3  -will repeat BMP in AM  6. ADD: history of ADD and take Adderall "as needed", per patient report. We will not continue during hospitalization   7. Hypertension: BP stable on admission  -continue home losartan/HCTZ ADD: history of ADD and takes Adderall "as needed", per patient report.  Will not continue this during hospitalization   8. PPx: heparin  9. FEN/GI: clear liquid diet, advance as tolerated  -IV NS 75 ml/hr  -zofran prn  -miralax prn 10. Disposition: pending overall clinical improvement including improvement of pain, tolerating PO and further work up including MI rule-out 11. Code Status: full code  History of Present Illness: [name] is a 78 y.o. year old female with a hx of hypertension, hyperlipidemia, bipolar disorder, and osteoarthritis who presents with a three day history of sharp LLQ pain and one day of bilious vomiting.  She reports pain in her LLQ that reaches a 6/10 in intensity and that is episodic in nature.  She has had nothing like this before.  Last night she had bilious vomiting every 30 minutes.  She has had fever and chills and has not eaten much for the past three days but has tried to stay hydrated.  No diarrhea or constipation.  She reports no burning on urination but has had an increase in urinary frequency.  She has a hx of a lapraroscopic hiatal hernia repair as well as an ovarian cyst as a teenager.  She takes Advil at home prn for osteoarthritis pain.  On exam in the ED, pt had rigors and trouble completing the interview because of pain.  The nurse said she had been fine previously, and had acutely worsened.  She reported the return of the sharp LLQ pain as well as back pain and a headache as well.  Of note, when told she had to stay overnight she  had to call the vet to give permission to euthanize her dog.  CBC showed WBC at 13.2, Hb 13.7.  BMP was unremarkable.  ISTAT troponins were 0.09, 0.12, 0.14.  Cardiology recommends monitoring and check of cardiac enzymes.  A UA was positive for nitrites, moderate leukocytes, and many bacteria.  CT showed findings that could be consistent with pyelonephritis if appropriate clinical setting as well as nonspecific inflammatory changes in the left pericolic gutter that could show a mild uncomplicated  descending colon diverticulitis.  Ciprofloxacin and Flagyl were given in the ED for possible diverticulitis.  Patient Active Problem List  Diagnosis  . HYPERLIPIDEMIA  . BIPOLAR DISORDER  . NEUROPATHY, PERIPHERAL  . HYPERTENSION, BENIGN SYSTEMIC  . ANGINA PECTORIS, NOS  . OSTEOARTHRITIS, MULTI SITES  . ROTATOR CUFF TENDONITIS  . CONVULSIONS, SEIZURES, NOS  . ADD (attention deficit disorder)  . Osteopenia   Past Medical History: Past Medical History  Diagnosis Date  . Depression   . Ovarian cyst    Past Surgical History: Past Surgical History  Procedure Date  . Hiatal hernia repair   . Breast reduction surgery    Social History: History  Substance Use Topics  . Smoking status: Never Smoker   . Smokeless tobacco: Never Used  . Alcohol Use: No  Had to call to give permission to euthanize dog.  Family History: Family History  Problem Relation Age of Onset  . Alzheimer's disease Mother   Extensive cardiac family history, per patient  Allergies: Allergies  Allergen Reactions  . Amoxicillin Anaphylaxis    REACTION: unspecified  . Penicillins Anaphylaxis  . Morphine Sulfate Other (See Comments)    unknown  . Phenobarbital Other (See Comments)    unknown  . Phenytoin Other (See Comments)    unknown   No current facility-administered medications on file prior to encounter.   Current Outpatient Prescriptions on File Prior to Encounter  Medication Sig Dispense Refill  . amphetamine-dextroamphetamine (ADDERALL, 10MG ,) 10 MG tablet Take 1 tablet (10 mg total) by mouth daily.  90 tablet  0  . aspirin 81 MG EC tablet Take 1 tablet (81 mg total) by mouth daily. Swallow whole.  30 tablet  12  . atorvastatin (LIPITOR) 10 MG tablet Take 1 tablet (10 mg total) by mouth at bedtime.  90 tablet  3  . ibuprofen (ADVIL,MOTRIN) 800 MG tablet Take 800 mg by mouth every 8 (eight) hours as needed. For pain      . losartan-hydrochlorothiazide (HYZAAR) 100-25 MG per tablet TAKE 1 TABLET  BY MOUTH DAILY  30 tablet  0  . metoprolol succinate (TOPROL-XL) 50 MG 24 hr tablet Take 1 tablet (50 mg total) by mouth daily. Take with or immediately following a meal.  90 tablet  3  . DISCONTD: FLUoxetine (PROZAC) 20 MG capsule TAKE 3 CAPSULES BY MOUTH DAILY  270 capsule  0  . acyclovir (ZOVIRAX) 5 % ointment Apply 1 application topically as needed. For herpes      . albuterol (VENTOLIN HFA) 108 (90 BASE) MCG/ACT inhaler Inhale 1 puff into the lungs every 6 (six) hours as needed. q4-6 prn        Review Of Systems: Per HPI with the following additions:  No CP, SOB, diarrhea, constipation, dizziness, changes in vision.  Physical Exam: BP 139/106  Pulse 89  Temp 100.4 F (38 C) (Oral)  Resp 32  SpO2 99% Exam: General: uncomfortable appearing woman with rigors Eyes: pupils round and reactive to light Cardiovascular: regular rate and  rhythm Respiratory: clear to auscultation Abdomen: diffusely tender to palpation Extremities: no swelling in LE Back: slight CVA tenderness, hard to differentiate from general osteoarthritis pain due to patient discomfort and rigors  Labs and Imaging: CBC BMET   Lab 03/24/12 0835  WBC 13.2*  HGB 13.7  HCT 38.9  PLT 204    Lab 03/24/12 0835  NA 136  K 2.9*  CL 101  CO2 21  BUN 19  CREATININE 0.79  GLUCOSE 133*  CALCIUM 9.5    Georgina Pillion, Med Student 03/24/2012, 4:44 PM  PGY-2 Addendum: I have seen and examined patient and I have edited note above to reflect my findings. In brief, this is a 78 yo F with pmh of HTN, bipolar disorder, ADD who presents with 3 day history of worsening left lower quadrant pain who presented to ED for evaluation after developing N/V and fevers. Patient had UA and CT scan which indicate pyelonephritis.   PE:  Filed Vitals:   03/24/12 1630  BP: 148/76  Pulse: 88  Temp:   Resp: 20  General: Lying in bed, rolling around in pain. Difficult to answer questions but does respond appropriately to direct  questions. Her pain appeared to get worse throughout our interview and she was requesting water and pain medication. Notable chills and requesting blankets. HEENT: AT, [place]. MMM Heart: RRR. No murmur appreciated Pulm: Auscultated while on left side. CTAB Abdomen: Obese, does not appear distended. Diffusely tender, more so in lower quadrants. MS: Moves all extremities. Back is tender to palpation of spine. Neuro: Grossly nonfocal  A/P: # Abdominal Pain: Given, UA, CT scan and consitutional symptoms most likely secondary to pyelonephritis. Diverticular disease was seen on CT scan as well but this is less likely given negative FOBT and stable HgB. Received cipro/flagyl in ED. - Admit to telemetry. - Continue Cipro 400mg  IV q12hours since patient has anaphylaxis to penicillin we will avoid all penicillins and cephalosporins. Will await urine gram stain, C&S to further direct our therapy. Will transition to PO when clinically improved and tolerating PO. Did not add enterococcus coverage at this time due to low likelihood - Pain control with Morphine, nausea control with Zofran  # Elevated Troponin- Will cycle cardiac enzymes q8 hr x3. Monitor on tele. If symptomatic will have low threshold to call cardiology. For now, monitor and repeat EKG in AM  # HTN- BP controlled prior to pain episode. Continue home medications. Once pain is controlled, would expect her BP to return to baseline. If remains high, will consider PRN dose  # Mental health- diagnosed with bipolar disorder, but only on Prozac 60mg  daily. Also has diagnosis of ADD but only takes Adderall "as needed" therefore will not prescribe while in the hospital.  # Hypokalemia- K+ 2.9 at arrival to the ED. Given 2 runs of K in the ED, but given limited IV access we will give Kdur x3 and recheck Bmet in the morning.   # Clear liquids given nausea but advance as tolerated. NS @ 75cc/hr  # Discharge pending further work up and clinical  improvement.  Nikyla Navedo M. Mathews Stuhr, M.D. 03/24/2012 5:16 PM

## 2012-03-24 NOTE — ED Notes (Signed)
Patient transported to CT 

## 2012-03-24 NOTE — ED Notes (Signed)
Patient transported to X-ray 

## 2012-03-24 NOTE — ED Notes (Signed)
Admitting MD's at bedside and aware of BP--pt given pain meds and antinausea medicine

## 2012-03-24 NOTE — ED Notes (Signed)
Per EMS pt from home with c/o generalized abdominal pain x few days. Reports nausea/vomiting/diarrhea. Reports fever/chills. VSS.

## 2012-03-24 NOTE — ED Provider Notes (Signed)
History     CSN: [id number]  Arrival date & time 03/24/12  3086   First MD Initiated Contact with Patient 03/24/12 0757      Chief Complaint  Patient presents with  . Abdominal Pain    (Consider location/radiation/quality/duration/timing/severity/associated sxs/prior treatment) HPI This is a 78 year old woman, who presents with left abdominal sharp pain. The pain started 3 days ago and it worsened last night with nausea and vomiting. The pain is episodic coming every after a few minutes and reaching up to 6/10 in severity. Between episodes it reduces to 0/10. It is mainly in her lower left quadrant radiating to the midline in the lower region of the abdomen. This is the first time she has experienced such pain. At that time I interviewed her, she did not have any pain. She vomited billous appearing substance every after 20-30 minutes throughout the night. She also reports feeling dehydrated with dry mouth and drinking a lot of water over the night. She reports a history of fever and chills as well. She feels that abdomen is a bit full. She had a piece of bread last night. She reports passing stool last evening.  She has a history of a laparoscopic hiatal hernia repair 5 years ago  Past Medical History  Diagnosis Date  . Depression   . Ovarian cyst     Past Surgical History  Procedure Date  . Hiatal hernia repair   . Breast reduction surgery     Family History  Problem Relation Age of Onset  . Alzheimer's disease Mother     History  Substance Use Topics  . Smoking status: Never Smoker   . Smokeless tobacco: Never Used  . Alcohol Use: No    OB History    Grav Para Term Preterm Abortions TAB SAB Ect Mult Living                  Review of Systems  Constitutional: Positive for appetite change and fatigue. Negative for diaphoresis and unexpected weight change.  HENT: Negative.   Eyes: Negative.   Respiratory: Negative for cough, chest tightness, shortness of breath and  stridor.   Cardiovascular: Negative for chest pain, palpitations and leg swelling.  Genitourinary: Negative for dysuria, frequency, hematuria, decreased urine volume, difficulty urinating, genital sores and dyspareunia.  Musculoskeletal: Negative.   Neurological: Positive for dizziness and light-headedness. Negative for tremors, seizures, syncope, facial asymmetry, weakness, numbness and headaches.  Psychiatric/Behavioral: Negative.     Allergies  Amoxicillin; Morphine sulfate; Phenobarbital; and Phenytoin  Home Medications   Current Outpatient Rx  Name Route Sig Dispense Refill  . ACYCLOVIR 5 % EX OINT  Disp 15gm tube. aaa tid     . ALBUTEROL SULFATE HFA 108 (90 BASE) MCG/ACT IN AERS Inhalation Inhale 1 puff into the lungs every 6 (six) hours as needed. q4-6 prn     . AMPHETAMINE-DEXTROAMPHETAMINE 10 MG PO TABS Oral Take 1 tablet (10 mg total) by mouth daily. 90 tablet 0  . ASPIRIN 81 MG PO TBEC Oral Take 1 tablet (81 mg total) by mouth daily. Swallow whole. 30 tablet 12  . ATORVASTATIN CALCIUM 10 MG PO TABS Oral Take 1 tablet (10 mg total) by mouth at bedtime. 90 tablet 3  . FLUOXETINE HCL 20 MG PO CAPS  TAKE 3 CAPSULES BY MOUTH DAILY 270 capsule 0  . IBUPROFEN 800 MG PO TABS  800 mg. Take one by mouth every 8 hours sch for 2 days then prn but no longer than  10d     . LOSARTAN POTASSIUM-HCTZ 100-25 MG PO TABS  TAKE 1 TABLET BY MOUTH DAILY 30 tablet 0  . METOPROLOL SUCCINATE ER 50 MG PO TB24 Oral Take 1 tablet (50 mg total) by mouth daily. Take with or immediately following a meal. 90 tablet 3  . TRIAMCINOLONE ACETONIDE 0.1 % EX CREA  APPLY TOPICALLY TO THE AFFECTED AREA TWICE DAILY 30 g 0    BP 97/34  Pulse 65  Temp 99.4 F (37.4 C) (Oral)  Resp 18  SpO2 94%  Physical Exam  Constitutional: She is oriented to person, place, and time. She appears well-developed and well-nourished. No distress.  HENT:  Head: Normocephalic and atraumatic.  Eyes: Conjunctivae are normal. Pupils are  equal, round, and reactive to light.  Neck: Normal range of motion. Neck supple.  Cardiovascular: Normal rate, regular rhythm, normal heart sounds and intact distal pulses.  Exam reveals no gallop.   No murmur heard. Pulmonary/Chest: No respiratory distress. She has no wheezes. She has no rales. She exhibits no tenderness.  Abdominal: Soft. Normal appearance. She exhibits no shifting dullness, no distension, no pulsatile liver, no abdominal bruit, no pulsatile midline mass and no mass. Bowel sounds are increased. There is no hepatosplenomegaly. There is tenderness in the suprapubic area, left upper quadrant and left lower quadrant. There is guarding. There is no rigidity, no rebound, no CVA tenderness, no tenderness at McBurney's point and negative Murphy's sign. No hernia. Hernia confirmed negative in the ventral area, confirmed negative in the right inguinal area and confirmed negative in the left inguinal area.  Genitourinary: Rectal exam shows no external hemorrhoid, no internal hemorrhoid, no fissure, no mass, no tenderness and anal tone normal. Pelvic exam was performed with patient supine.       On rectal exam: No active bleeding. The rectum contains some hard stool. The examining finger has normal appearing stool without blood. Fecal occult blood stool test ordered.  Musculoskeletal: Normal range of motion. She exhibits no edema and no tenderness.  Neurological: She is alert and oriented to person, place, and time. No cranial nerve deficit. Coordination normal.  Skin: Skin is warm and dry. She is not diaphoretic.  Psychiatric: She has a normal mood and affect.    ED Course  Procedures (including critical care time)   Labs Reviewed  OCCULT BLOOD, POC DEVICE  CBC WITH DIFFERENTIAL  COMPREHENSIVE METABOLIC PANEL  LIPASE, BLOOD  URINALYSIS, ROUTINE W REFLEX MICROSCOPIC  APTT  PROTIME-INR    Date: 03/24/2012  Rate: 70   Rhythm: normal sinus rhythm  QRS Axis: right  Intervals:  normal  ST/T Wave abnormalities: normal  Conduction Disutrbances:none  Narrative Interpretation:   Old EKG Reviewed: unchanged      MDM  78 year old woman, who presents with left quadrant abdominal pain, with nausea and vomiting, fever, chills, and dehydration for 3 days. Patient has no rectal bleeding. Differential diagnoses include acute diverticulitis and bowel obstruction. Other possibilities include a urinary tract infection and pancreatitis. We will proceed and do a CBC, BMET, urinalysis, abdominal X-ray series, lipase, and hepatic panel. Will keep the patient nil per os. Will give her IV fluids in the interim.        Dow Adolph, MD 03/24/12 8295  Dow Adolph, MD 03/24/12 [phone]

## 2012-03-24 NOTE — Telephone Encounter (Signed)
Patient is calling to let Dr. Leveda Anna know that she has been admitted to the hospital with Diverticulitus and a Kidney Infection.  She is in the Clinical Decision Unit, CDU bed 9.  Dr. Mikel Cella is admitting her, but the patient wanted to let her MD know.

## 2012-03-25 ENCOUNTER — Encounter (HOSPITAL_COMMUNITY): Payer: Self-pay | Admitting: Cardiology

## 2012-03-25 DIAGNOSIS — I214 Non-ST elevation (NSTEMI) myocardial infarction: Secondary | ICD-10-CM

## 2012-03-25 DIAGNOSIS — E876 Hypokalemia: Secondary | ICD-10-CM

## 2012-03-25 DIAGNOSIS — I2510 Atherosclerotic heart disease of native coronary artery without angina pectoris: Secondary | ICD-10-CM

## 2012-03-25 DIAGNOSIS — N12 Tubulo-interstitial nephritis, not specified as acute or chronic: Secondary | ICD-10-CM | POA: Diagnosis not present

## 2012-03-25 DIAGNOSIS — A419 Sepsis, unspecified organism: Secondary | ICD-10-CM | POA: Diagnosis not present

## 2012-03-25 DIAGNOSIS — N10 Acute tubulo-interstitial nephritis: Secondary | ICD-10-CM | POA: Diagnosis not present

## 2012-03-25 LAB — CBC
Hemoglobin: 11.4 g/dL — ABNORMAL LOW (ref 12.0–15.0)
Platelets: 172 10*3/uL (ref 150–400)
RBC: 3.71 MIL/uL — ABNORMAL LOW (ref 3.87–5.11)
WBC: 15.7 10*3/uL — ABNORMAL HIGH (ref 4.0–10.5)

## 2012-03-25 LAB — COMPREHENSIVE METABOLIC PANEL
BUN: 21 mg/dL (ref 6–23)
CO2: 25 mEq/L (ref 19–32)
Calcium: 8.4 mg/dL (ref 8.4–10.5)
Chloride: 99 mEq/L (ref 96–112)
Creatinine, Ser: 1.03 mg/dL (ref 0.50–1.10)
GFR calc non Af Amer: 50 mL/min — ABNORMAL LOW (ref >90)
Total Bilirubin: 0.6 mg/dL (ref 0.3–1.2)

## 2012-03-25 LAB — CARDIAC PANEL(CRET KIN+CKTOT+MB+TROPI)
CK, MB: 3.4 ng/mL (ref 0.3–4.0)
Relative Index: 2.9 — ABNORMAL HIGH (ref 0.0–2.5)
Total CK: 106 U/L (ref 7–177)
Troponin I: 0.43 ng/mL (ref <0.30)

## 2012-03-25 LAB — GRAM STAIN

## 2012-03-25 LAB — HEPARIN LEVEL (UNFRACTIONATED)
Heparin Unfractionated: 0.14 IU/mL — ABNORMAL LOW (ref 0.30–0.70)
Heparin Unfractionated: 0.14 IU/mL — ABNORMAL LOW (ref 0.30–0.70)

## 2012-03-25 MED ORDER — IBUPROFEN 600 MG PO TABS
600.0000 mg | ORAL_TABLET | Freq: Once | ORAL | Status: AC
  Administered 2012-03-25: 600 mg via ORAL
  Filled 2012-03-25: qty 1

## 2012-03-25 MED ORDER — HYDROCODONE-ACETAMINOPHEN 5-325 MG PO TABS
1.0000 | ORAL_TABLET | ORAL | Status: DC | PRN
  Administered 2012-03-25: 1 via ORAL
  Filled 2012-03-25: qty 1

## 2012-03-25 MED ORDER — POTASSIUM CHLORIDE CRYS ER 20 MEQ PO TBCR
40.0000 meq | EXTENDED_RELEASE_TABLET | Freq: Two times a day (BID) | ORAL | Status: AC
  Administered 2012-03-25 (×2): 40 meq via ORAL
  Filled 2012-03-25: qty 2

## 2012-03-25 MED ORDER — SODIUM CHLORIDE 0.9 % IV SOLN
1.0000 mL/kg/h | INTRAVENOUS | Status: DC
  Administered 2012-03-26: 1 mL/kg/h via INTRAVENOUS

## 2012-03-25 MED ORDER — FLUOXETINE HCL 20 MG PO CAPS: 20.0000 mg | ORAL_CAPSULE | Freq: Every day | ORAL | Status: DC

## 2012-03-25 MED ORDER — HEPARIN (PORCINE) IN NACL 100-0.45 UNIT/ML-% IJ SOLN
1900.0000 [IU]/h | INTRAMUSCULAR | Status: DC
  Administered 2012-03-25: 1450 [IU]/h via INTRAVENOUS
  Administered 2012-03-25: 1700 [IU]/h via INTRAVENOUS
  Administered 2012-03-26: 1900 [IU]/h via INTRAVENOUS
  Filled 2012-03-25 (×4): qty 250

## 2012-03-25 MED ORDER — HEPARIN BOLUS VIA INFUSION
3000.0000 [IU] | Freq: Once | INTRAVENOUS | Status: AC
  Administered 2012-03-25: 3000 [IU] via INTRAVENOUS
  Filled 2012-03-25: qty 3000

## 2012-03-25 MED ORDER — ATORVASTATIN CALCIUM 10 MG PO TABS
10.0000 mg | ORAL_TABLET | Freq: Every day | ORAL | Status: DC
  Administered 2012-03-25 – 2012-03-26 (×2): 10 mg via ORAL
  Filled 2012-03-25 (×3): qty 1

## 2012-03-25 MED ORDER — FLUOXETINE HCL 20 MG PO CAPS
60.0000 mg | ORAL_CAPSULE | Freq: Every day | ORAL | Status: DC
  Administered 2012-03-25 – 2012-03-27 (×3): 60 mg via ORAL
  Filled 2012-03-25 (×3): qty 3

## 2012-03-25 MED ORDER — ASPIRIN 81 MG PO CHEW
324.0000 mg | CHEWABLE_TABLET | ORAL | Status: AC
  Administered 2012-03-26: 324 mg via ORAL
  Filled 2012-03-25: qty 4

## 2012-03-25 MED ORDER — ASPIRIN EC 81 MG PO TBEC
81.0000 mg | DELAYED_RELEASE_TABLET | Freq: Every day | ORAL | Status: DC
  Administered 2012-03-25 – 2012-03-27 (×2): 81 mg via ORAL
  Filled 2012-03-25 (×3): qty 1

## 2012-03-25 MED ORDER — DIAZEPAM 2 MG PO TABS
2.0000 mg | ORAL_TABLET | ORAL | Status: AC
  Filled 2012-03-25: qty 1

## 2012-03-25 MED ORDER — ASPIRIN 81 MG PO TBEC: 81.0000 mg | DELAYED_RELEASE_TABLET | Freq: Every day | ORAL | Status: DC

## 2012-03-25 MED ORDER — CALCIUM CARBONATE ANTACID 500 MG PO CHEW
400.0000 mg | CHEWABLE_TABLET | Freq: Two times a day (BID) | ORAL | Status: DC | PRN
  Administered 2012-03-25: 400 mg via ORAL
  Filled 2012-03-25: qty 2

## 2012-03-25 NOTE — Consult Note (Signed)
CARDIOLOGY CONSULT NOTE  Patient ID: [name], MRN: [id number], DOB/AGE: Apr 25, 1931 78 y.o. Admit date: 03/24/2012 Date of Consult: 03/25/2012  Primary Physician: Sanjuana Letters, MD Primary Cardiologist: None  Chief Complaint: Abd pain, n/v, fever Reason for Consultation: Elevated troponin in the setting of urosepsis 2/2 pyelonephritis   HPI: 78 y.o. female w/ PMHx significant for HTN, HLD and seizures, no prior cardiac history, who presented to [facility] on 03/24/2012 with complaints of abdominal pain, fever, and vomiting and found to have pyelonephritis.   Patient has no prior cardiac history. Reports having family history of early CAD. Had a stress test "many years ago", unsure reason why, unsure results. Denies ever having cardiac issues. No cath or echo. She is not very active due to chronic back pain and balance issues, but is able to perform ADLs, gardening, and walking without chest pain or sob. Denies orthopnea, sob, edema, palpitations, syncope. She reported LLQ/left side pain for a couple of weeks, but worsened a few days prior to presentation. Developed nausea and bilious vomiting the day prior to presentation prompting her to come to the ED.   In the ED, CXR was without acute cardiopulmonary findings. EKG showed Sinus rhythm, no acute ST/T changes. Labs were significant for WBC 13.2, (+) UA, K+ 2.9, (-) FOBT, Lactic acid 2.4. CT showed findings that could be consistent with pyeloneprhtis as well as diverticulitis.  iStat troponins were drawn in the ED with troponin 0.09, 0.12, 0.14, 0.16. She has had no anginal complaints or EKG changes. She was admitted by medicine for pyelonephritis and started on abx. Repeat cardiac enzymes showed normal CK/CKMB and troponinI 0.37 -->0.55 --> 0.55. She was started on heparin drip. Currently resting comfortably in no acute distress.     Past Medical History  Diagnosis Date  . Depression   . Ovarian cyst       Surgical  History:  Past Surgical History  Procedure Date  . Hiatal hernia repair   . Breast reduction surgery      Home Meds: Medication Sig  amphetamine-dextroamphetamine (ADDERALL, 10MG ,) 10 MG tablet Take 1 tablet (10 mg total) by mouth daily.  aspirin 81 MG EC tablet Take 1 tablet (81 mg total) by mouth daily. Swallow whole.  atorvastatin (LIPITOR) 10 MG tablet Take 1 tablet (10 mg total) by mouth at bedtime.  FLUoxetine (PROZAC) 20 MG capsule   ibuprofen (ADVIL,MOTRIN) 800 MG tablet Take 800 mg by mouth every 8 (eight) hours as needed. For pain  losartan-hydrochlorothiazide (HYZAAR) 100-25 MG per tablet TAKE 1 TABLET BY MOUTH DAILY  metoprolol succinate (TOPROL-XL) 50 MG 24 hr tablet Take 1 tablet (50 mg total) by mouth daily. Take with or immediately following a meal.  acyclovir (ZOVIRAX) 5 % ointment Apply 1 application topically as needed. For herpes  albuterol (VENTOLIN HFA) 108 (90 BASE) MCG/ACT inhaler Inhale 1 puff into the lungs every 6 (six) hours as needed. q4-6 prn     Inpatient Medications:   . aspirin  324 mg Oral Once  . ciprofloxacin  400 mg Intravenous Once  . ciprofloxacin  400 mg Intravenous Q12H  . heparin  3,000 Units Intravenous Once  . ibuprofen  600 mg Oral Once  . iohexol  20 mL Oral Q1 Hr x 2  . metronidazole  500 mg Intravenous Once  . ondansetron (ZOFRAN) IV  4 mg Intravenous Once  . potassium chloride  20 mEq Oral TID  . sodium chloride  3 mL Intravenous Q12H  . DISCONTD:  ciprofloxacin  400 mg Intravenous Q12H  . DISCONTD: heparin  5,000 Units Subcutaneous Q8H  . DISCONTD: oxyCODONE-acetaminophen  1 tablet Oral Once  . DISCONTD: potassium chloride  10 mEq Intravenous Q1 Hr x 4   . sodium chloride 75 mL/hr at 03/25/12 0528  . heparin Stopped (03/25/12 0209)  . DISCONTD: sodium chloride Stopped (03/24/12 1049)  . DISCONTD: lactated ringers 100 mL/hr at 03/24/12 1055    Allergies:  Allergies  Allergen Reactions  . Amoxicillin Anaphylaxis    REACTION:  unspecified  . Penicillins Anaphylaxis  . Morphine Sulfate Other (See Comments)    unknown  . Phenobarbital Other (See Comments)    unknown  . Phenytoin Other (See Comments)    unknown    History   Social History  . Marital Status: Divorced    Spouse Name: N/A    Number of Children: N/A  . Years of Education: N/A   Occupational History  . Retired- ER Nurse    Social History Main Topics  . Smoking status: Never Smoker   . Smokeless tobacco: Never Used  . Alcohol Use: No  . Drug Use: No  . Sexually Active: Not on file   Other Topics Concern  . Not on file   Social History Narrative   Patient identified her friend, Angela Adam, as her emergency contact at [phone].     Family History  Problem Relation Age of Onset  . Alzheimer's disease Mother      Review of Systems: General: (+) chills, fever; No night sweats or weight changes.  Cardiovascular: negative for chest pain, shortness of breath, dyspnea on exertion, edema, orthopnea, palpitations, or paroxysmal nocturnal dyspnea Dermatological: negative for rash Respiratory: negative for cough or wheezing Urologic: negative for hematuria Abdominal: (+) nausea, vomiting; negative for diarrhea, bright red blood per rectum, melena, or hematemesis Neurologic: negative for visual changes, syncope, or dizziness All other systems reviewed and are otherwise negative except as noted above.  Labs:  [facility] 03/25/12 0355 03/24/12 2203 03/24/12 1628  CKTOTAL 107 117 111  CKMB 3.1 3.0 2.8  TROPONINI 0.55* 0.55* 0.37*   Component Value Date   WBC 15.7* 03/25/2012   HGB 11.4* 03/25/2012   HCT 33.0* 03/25/2012   MCV 88.9 03/25/2012   PLT 172 03/25/2012    Lab 03/25/12 0355  NA 135  K 3.1*  CL 99  CO2 25  BUN 21  CREATININE 1.03  CALCIUM 8.4  PROT 6.1  BILITOT 0.6  ALKPHOS 78  ALT 11  AST 20  GLUCOSE 121*     03/24/2012 14:16  Lactic Acid, Venous 2.4 (H)     03/24/2012 10:22  Color, Urine YELLOW  APPearance  CLOUDY (A)  Specific Gravity, Urine 1.017  pH 6.0  Glucose NEGATIVE  Bilirubin Urine NEGATIVE  Ketones, ur NEGATIVE  Protein 100 (A)  Urobilinogen, UA 0.2  Nitrite POSITIVE (A)  Leukocytes, UA MODERATE (A)  Hgb urine dipstick SMALL (A)  Urine-Other MUCOUS PRESENT  WBC, UA TOO NUMEROUS TO COUNT  RBC / HPF 7-10  Squamous Epithelial / LPF RARE  Bacteria, UA MANY (A)    Radiology/Studies:   03/24/2012 - Ct Abdomen Pelvis W Contrast Findings: There are inflammatory changes along the left pericolic gutter and along the lateral conal fascia and left lateral perinephric and pararenal spaces.  This is nonspecific.  There are several descending and proximal sigmoid colon diverticula.  This may reflect a mild uncomplicated descending colon diverticulitis. No abscess or extraluminal air is seen.  The  bowel is nondilated. There is no other evidence of bowel inflammation.  A normal appendix is not discretely seen.  No findings of appendicitis.  The large hiatal hernia seen previously has been reduced.  There is fullness at the gastroesophageal junction which may reflect a fundoplication.  Lung bases show mild reticular opacities that are likely scarring or subsegmental atelectasis.  Lung bases otherwise clear.  The heart is normal in size.  Normal liver, spleen, gallbladder and pancreas.  No bile duct dilation.  No adrenal masses.  There is bilateral renal cortical thinning.  No renal mass or stone or hydronephrosis.  The left perinephric opacity could be reactive from diverticulitis.  Consider pyelonephritis in the proper clinical setting.  Normal ureters.  A few tiny bubbles of nondependent air in otherwise unremarkable bladder.  This may be from recent instrumentation but is nonspecific.  Uterus is surgically absent.  No pelvic masses.  No pathologically enlarged lymph nodes.  There are significant degenerative changes throughout the visualized spine.  No osteoblastic or osteolytic lesions.  IMPRESSION:  Mild inflammatory changes along the left pericolic gutter and along the left lateral perinephric in the perirenal spaces.  There are diverticula along the descending sigmoid colon.  This could reflect a mild uncomplicated descending colon diverticulitis.  Consider pyelonephritis in the proper clinical setting.  No abscess or extraluminal air.  No other evidence of an acute abnormality.     03/24/2012 - Abd Acute W/chest Findings: Chronic interstitial markings.  No frank interstitial edema.  No pleural effusion or pneumothorax.  Cardiomediastinal silhouette is within normal limits.  Mildly prominent but nondilated small bowel loops in the mid abdomen, without findings to suggest small bowel obstruction. Moderate stool in the distal colon.  Degenerative changes with the lumbar dextroscoliosis.  IMPRESSION: No evidence of acute cardiopulmonary disease.  Chronic interstitial markings.  No evidence of small bowel obstruction or free air.      EKG: Sinus rhythm, no acute ST/T changes  Physical Exam: Blood pressure 93/56, pulse 56, temperature 98.3 F (36.8 C), temperature source Oral, resp. rate 18, height 5\' 5"  (1.651 m), weight 229 lb 8 oz (104.1 kg), SpO2 98.00%. General: Pleasant overweight elderly white female, in no acute distress. Head: Normocephalic, atraumatic, sclera non-icteric, no xanthomas, nares are without discharge.  Neck: Supple. Negative for carotid bruits. JVD not elevated. Lungs: Clear bilaterally to auscultation without wheezes, rales, or rhonchi. Breathing is unlabored. Heart: RRR with S1 S2. 2/6 systolic murmur LSB. No rubs or gallops appreciated. Abdomen: Soft, diffusely tender, non-distended with normoactive bowel sounds.  No rebound/guarding. No obvious abdominal masses. Msk:  Strength and tone appear normal for age. Extremities: No clubbing or cyanosis. No edema.  Distal pedal pulses are intact and equal bilaterally. Neuro: Alert and oriented X 3. Moves all extremities  spontaneously. Psych:  Responds to questions appropriately with a normal affect.   Assessment and Plan:  78 y.o. female w/ PMHx significant for HTN, HLD and seizures, no prior cardiac history, who presented to [facility] on 03/24/2012 with complaints of abdominal pain, fever, and vomiting and found to have pyelonephritis.   1. NSTEMI 2. Pyelonephritis 3. Urosepsis 4. Hypertension 5. Hyperlipidemia 6. Hypokalemia    Signed, HOPE, JESSICA PA-C 03/25/2012, 9:03 AM   Patient seen and examined with [facility]. We discussed all aspects of the encounter. I agree with the assessment and plan as stated above.   Elevated troponin likely indicative of demand ischemia in the setting of urosepsis. She is asymptomatic  now and at baseline. Discussed options of cath versus stress test to quantify ischemic burden. She would like to proceed with cath - will plan for tomorrow. Agree with heparin for another 24 hours. Treat with ASA, statin and b-blocker as BP permits. Supp electrolytes.  Thanks for consult. Will follow.   Laketa Sandoz,MD 10:19 AM

## 2012-03-25 NOTE — Progress Notes (Addendum)
Patient ID: Whittney Steenson, female   DOB: [birth year], 78 y.o.   MRN: [id number] Seen and examined.  I am both attending and continuity MD for Ms. Skeet Simmer:  Appreciate excellent management by [facility]PT[facility].  Agree with their care.  Briefly: 1. Fever, leukocytosis and LLQ abd discomfort.  Agree that pyelo is likely dx.  With low blood pressure and cardiac strain (see #2), I believe she meets criteria for urosepsis.  She is improving.  Await cultures and continue iv antibiotics for now.  2. Elevated troponin.  Likely real cardiac injury (NSTMI) from cardiac strain of urosepsis.  No acute intervention.  Agree with cardiac work up when more stable. 3. Bipolar.  She is depressed at present.  In addition to her obvious medical concerns, she had her dog (17yo) euthanized this week.  Tis a sad time for her. For other problems, see resident note. Will need to remain in hosp at least another 24 hours to sort out and treat.    To clarify, I believe this patients' clinical picture supports the following sequence of events: first UTI, which caused pyelonephritis which in turn caused urosepsis.  Stated another way, she had sepsis, secondary to pyelonephritis which was secondary to UTI.

## 2012-03-25 NOTE — H&P (Signed)
Family Medicine Teaching Service Attending Note  On 8-13 I interviewed and examined patient [name] and reviewed their tests and x-rays.  I discussed with Dr. Mikel Cella and reviewed their note for today.  I agree with their assessment and plan.     Additionally  Complaining of pain all over - seems to be most localized to left CVA area and LLQ.  No guarding or rebound on abdomen exam.  Given CT and UA results most likely pyelo - will treat as such but will need freq abdomen exams Elevated Trop with normal ecg - will follow with telemetry and enzymes

## 2012-03-25 NOTE — Progress Notes (Signed)
ANTICOAGULATION CONSULT NOTE - Follow Up Consult  Pharmacy Consult for UFH Indication: NSTEMI  Allergies  Allergen Reactions  . Amoxicillin Anaphylaxis    REACTION: unspecified  . Penicillins Anaphylaxis  . Morphine Sulfate Other (See Comments)    unknown  . Phenobarbital Other (See Comments)    unknown  . Phenytoin Other (See Comments)    unknown    Patient Measurements: Height: 5\' 5"  (165.1 cm) Weight: 229 lb 8 oz (104.1 kg) IBW/kg (Calculated) : 57  Heparin Dosing Weight: 80kg  Vital Signs: Temp: 98.3 F (36.8 C) (08/14 0534) Temp src: Oral (08/14 0534) BP: 93/56 mmHg (08/14 0534) Pulse Rate: 56  (08/14 0700)  Labs:  [facility] 03/25/12 0928 03/25/12 0927 03/25/12 0355 03/24/12 2203 03/24/12 1928 03/24/12 0835  HGB -- -- 11.4* -- -- 13.7  HCT -- -- 33.0* -- -- 38.9  PLT -- -- 172 -- -- 204  APTT -- -- -- -- -- 29  LABPROT -- -- -- -- -- 14.6  INR -- -- -- -- -- 1.12  HEPARINUNFRC -- 0.14* -- -- -- --  CREATININE -- -- 1.03 -- 1.00 0.79  CKTOTAL 106 -- 107 117 -- --  CKMB 3.4 -- 3.1 3.0 -- --  TROPONINI 0.43* -- 0.55* 0.55* -- --    Estimated Creatinine Clearance: 53 ml/min (by C-G formula based on Cr of 1.03).   Medications:  Scheduled:    . aspirin  324 mg Oral Once  . aspirin EC  81 mg Oral Daily  . atorvastatin  10 mg Oral QHS  . ciprofloxacin  400 mg Intravenous Once  . ciprofloxacin  400 mg Intravenous Q12H  . heparin  3,000 Units Intravenous Once  . ibuprofen  600 mg Oral Once  . metronidazole  500 mg Intravenous Once  . ondansetron (ZOFRAN) IV  4 mg Intravenous Once  . potassium chloride  40 mEq Oral BID  . sodium chloride  3 mL Intravenous Q12H  . DISCONTD: aspirin  81 mg Oral Daily  . DISCONTD: ciprofloxacin  400 mg Intravenous Q12H  . DISCONTD: heparin  5,000 Units Subcutaneous Q8H  . DISCONTD: oxyCODONE-acetaminophen  1 tablet Oral Once  . DISCONTD: potassium chloride  10 mEq Intravenous Q1 Hr x 4  . DISCONTD: potassium chloride  20 mEq  Oral TID   Infusions:    . sodium chloride 75 mL/hr at 03/25/12 0528  . heparin Stopped (03/25/12 0209)  . DISCONTD: lactated ringers 100 mL/hr at 03/24/12 1055    Assessment: 13 female patient admitted with chest pain, found to have positive cardiac enzymes and subsequently started on heparin gtt. First heparin level is subtherapeutic, however there were issues with the IV line patency so difficult to interpret lab. No documentation of current rate in [facility] but spoke with nurse who stated rate is 1200 units/hr. Plan for cath 8.15.  Goal of Therapy:  Heparin level 0.3-0.7 units/ml Monitor platelets by anticoagulation protocol: Yes   Plan:  Increase heparin gtt to 1450 units/hr and check 8 hour heparin level.  Verlene Mayer, PharmD, BCPS Pager [phone] 03/25/2012,11:18 AM

## 2012-03-25 NOTE — Care Management Note (Signed)
    Page 1 of 1   03/30/2012     8:18:01 AM   CARE MANAGEMENT NOTE 03/30/2012  Patient:  [name], [name]   Account Number:  [id number]  Date Initiated:  03/25/2012  Documentation initiated by:  Letha Cape  Subjective/Objective Assessment:   dx pyelonephritis  admit- lives alone, pta independent.     Action/Plan:   Anticipated DC Date:  03/27/2012   Anticipated DC Plan:  HOME/SELF CARE      DC Planning Services  CM consult      Choice offered to / List presented to:             Status of service:  Completed, signed off Medicare Important Message given?   (If response is "NO", the following Medicare IM given date fields will be blank) Date Medicare IM given:   Date Additional Medicare IM given:    Discharge Disposition:  HOME/SELF CARE  Per UR Regulation:  Reviewed for med. necessity/level of care/duration of stay  If discussed at Long Length of Stay Meetings, dates discussed:    Comments:  03/30/12 8:17 Letha Cape RN, BSN [phone] pt dc to home, no needs.  03/25/12 17:15 Letha Cape RN, BSN [phone] patient lives alone, she states she is independent and she has a cane that she hardly uses.  Patient has medication coverage and transportation at dc.  NCM will continue to follow for dc needs.

## 2012-03-25 NOTE — Clinical Documentation Improvement (Signed)
SEPSIS DOCUMENTATION QUERY  THIS DOCUMENT IS NOT A PERMANENT PART OF THE MEDICAL RECORD  TO RESPOND TO THE THIS QUERY, FOLLOW THE INSTRUCTIONS BELOW:  1. If needed, update documentation for the patient's encounter via the notes activity.  2. Access this query again and click edit on the In [employer].  3. After updating, or not, click F2 to complete all highlighted (required) fields concerning your review. Select "additional documentation in the medical record" OR "no additional documentation provided".  4. Click Sign note button.  5. The deficiency will fall out of your In Basket *Please let us know if you are not able to complete this workflow by phone or e-mail (listed below).  Please update your documentation within the medical record to reflect your response to this query.                                                                                    03/25/12  Dear Dr. Doralee Albino Marton Redwood,  In a better effort to capture your patient's severity of illness, reflect appropriate length of stay and utilization of resources, a review of the patient medical record has revealed the following indicators.    Based on your clinical judgment, please clarify and document in a progress note and/or discharge summary the clinical condition associated with the following supporting information:  In responding to this query please exercise your independent judgment.  The fact that a query is asked, does not imply that any particular answer is desired or expected.   Noted documenting "urosepsis" . If clinical picture is of a patient with  pyelonephritis due to a UTI this is appropriate , if however patient has pyelonephritis 2/2 to sepsis due to a UTI then presenting diagnosis is not  clearly defined .  If possible please clarify what is being documented.  Thank you    Possible Clinical Conditions?  Sepsis  Severe Sepsis  Sepsis with UTI  Other Condition   Cannot clinically  Determine    Reviewed: additional documentation in the medical record  Thank You,  Leonette Most Addison  Clinical Documentation Specialist RN, BSN:  Pager [phone] HIM off # [phone]  Health Information Management [place]

## 2012-03-25 NOTE — Progress Notes (Signed)
Spoke with pharmacists C Buettner and will increase heparin dose from 12 to 14.5 per his request.

## 2012-03-25 NOTE — Progress Notes (Signed)
Pt  Was complaining of some right arm and abdominal pain. Pt stated abdominal pain was related to heartburn. RN talked to MD on call about getting pt something for heartburn. MD called about pt having elevated troponin 9of 0.55. Pt states she is having no chest pain at this time. Nurse Tech did a 12 lead EKG on pt. EKG was Sinus Huston Foley with a heart rate of 59. RN called Dr on call to keep them informed on pt's new pain and EKG results. Pt is to be started on a heparin drip. RN will continue to monitor pt.

## 2012-03-25 NOTE — Progress Notes (Signed)
ANTICOAGULATION CONSULT NOTE - Follow Up Consult  Pharmacy Consult for UFH Indication: NSTEMI  Allergies  Allergen Reactions  . Amoxicillin Anaphylaxis    REACTION: unspecified  . Penicillins Anaphylaxis  . Morphine Sulfate Other (See Comments)    unknown  . Phenobarbital Other (See Comments)    unknown  . Phenytoin Other (See Comments)    unknown    [facility] Measurements: Height: 5\' 5"  (165.1 cm) Weight: 229 lb 8 oz (104.1 kg) IBW/kg (Calculated) : 57  Heparin Dosing Weight: 80kg  Vital Signs: Temp: 98.4 F (36.9 C) (08/14 1758) Temp src: Oral (08/14 1326) BP: 114/61 mmHg (08/14 1326) Pulse Rate: 65  (08/14 1326)  Labs:  Alvira Philips 03/25/12 1933 03/25/12 0928 03/25/12 0927 03/25/12 0355 03/24/12 2203 03/24/12 1928 03/24/12 0835  HGB -- -- -- 11.4* -- -- 13.7  HCT -- -- -- 33.0* -- -- 38.9  PLT -- -- -- 172 -- -- 204  APTT -- -- -- -- -- -- 29  LABPROT -- -- -- -- -- -- 14.6  INR -- -- -- -- -- -- 1.12  HEPARINUNFRC 0.14* -- 0.14* -- -- -- --  CREATININE -- -- -- 1.03 -- 1.00 0.79  CKTOTAL -- 106 -- 107 117 -- --  CKMB -- 3.4 -- 3.1 3.0 -- --  TROPONINI -- 0.43* -- 0.55* 0.55* -- --    Estimated Creatinine Clearance: 53 ml/min (by C-G formula based on Cr of 1.03).   Medications:  Scheduled:     . aspirin  324 mg Oral Pre-Cath  . aspirin EC  81 mg Oral Daily  . atorvastatin  10 mg Oral QHS  . ciprofloxacin  400 mg Intravenous Q12H  . diazepam  2 mg Oral On Call  . heparin  3,000 Units Intravenous Once  . ibuprofen  600 mg Oral Once  . potassium chloride  40 mEq Oral BID  . sodium chloride  3 mL Intravenous Q12H  . DISCONTD: aspirin  81 mg Oral Daily  . DISCONTD: heparin  5,000 Units Subcutaneous Q8H  . DISCONTD: potassium chloride  20 mEq Oral TID   Infusions:     . sodium chloride 75 mL/hr at 03/25/12 1517  . sodium chloride    . heparin 1,450 Units/hr (03/25/12 1709)    Assessment: [name] admitted with chest pain, found to have  positive cardiac enzymes and subsequently started on heparin gtt. Heparin level remains subtherapeutic despite rate increase earlier today.  Plan for cath 8.15.  Goal of Therapy:  Heparin level 0.3-0.7 units/ml Monitor platelets by anticoagulation protocol: Yes   Plan:  Give Heparin 3000 units IV bolus x 1 Increase Heparin drip to 1700 units/hr Recheck Heparin level and CBC in 8 hours  Estella Husk, Pharm.D., BCPS Clinical Pharmacist  Phone [phone] Pager [phone] 03/25/2012, 8:53 PM

## 2012-03-25 NOTE — Progress Notes (Signed)
Patient ID: [name], female   DOB: 20-Jan-1931, 78 y.o.   MRN: [id number] Family Medicine Teaching Service Daily Progress Note Service Page: [phone]   Subjective:  Pt overall is much improved from yesterday, talking and interacting with increased energy.  However, she continued to have abdominal pain overnight, as well as pain in her right arm.  She says she has experienced this arm pain a few times over the past month but is not sure if it is associated with her rotator cuff tendonitis.  She also experienced "heartburn," and TUMS helped to resolve the pain.  She denies vomiting, chest pain, or shortness of breath.  The abdominal pain is still present mostly in the LLQ but has decreased in intensity.   Cardiac enzymes were obtained overnight because of elevated iSTAT troponins in the ED, and these were elevated (0.37-->0.55-->0.55-->0.45).  EKG was obtained and showed no ST elevation and normal sinus rhythm.  Heparin was started at 12 ml/hr.  Morphine had been given for pain, and the patient's home aspirin dose has been continued.  She is currently on 2L [place] satting 98%.  BP had been normal in the ED but read 93/56 this morning, then returned to 114/61 by the afternoon.  Objective: Temp:  [98.2 F (36.8 C)-100.4 F (38 C)] 98.3 F (36.8 C) (08/14 0534) Pulse Rate:  [60-89] 60  (08/14 0534) Resp:  [16-32] 18  (08/14 0534) BP: (93-148)/(34-106) 93/56 mmHg (08/14 0534) SpO2:  [92 %-99 %] 98 % (08/14 0534) Weight:  [228 lb 6.4 oz (103.602 kg)-229 lb 8 oz (104.1 kg)] 229 lb 8 oz (104.1 kg) (08/14 0534) Exam: General: Pt is much improved clinically, seems to feel better. Cardiovascular: regular rate and rhythm Respiratory: no crackles or wheezes Abdomen: slight tenderness in the LLQ and across the midline.  No rebound tenderness.  Bowel sounds present Extremities: no swelling in LE.  I have reviewed the patient's medications, labs, imaging, and diagnostic testing.  Notable results are  summarized below.  CBC BMET   Lab 03/25/12 0355 03/24/12 0835  WBC 15.7* 13.2*  HGB 11.4* 13.7  HCT 33.0* 38.9  PLT 172 204    Lab 03/25/12 0355 03/24/12 1928 03/24/12 0835  NA 135 -- 136  K 3.1* -- 2.9*  CL 99 -- 101  CO2 25 -- 21  BUN 21 -- 19  CREATININE 1.03 1.00 0.79  GLUCOSE 121* -- 133*  CALCIUM 8.4 -- 9.5     EKG: no ST elevation, NSR,  CT: could correlate with pyelonephritis, multiple diverticula  Patient Assessment: [name] is a 78 y.o. year old female presenting with three days of sharp LLQ pain with fever and chills and one day of bilious vomiting.   Plan:  1. Urosepsis: Hypotension, fever, and leukocytosis in the setting of a UTI supported by UA positive for nitrites, leukocytes, and bacteria supports the diagnosis of urosepsis.  CT showed changes that could support pyelonephritis in the appropriate clinical setting, and also showed multiple diverticula.  This is less likely diverticulitis due to the mild nature of the CT changes, negative fecal occult blood test, and stable hemoglobin.   -continue ciprofloxacin 400 ml IV for urosepsis as patient has a PCN allergy.  Will continue this treatment as pt has become afebrile and is clinically improving.  Urine cx and gram stain not obtained in ED due to system error.  Patient safety report in    progress.   -d/c morphine because patient says it makes her nauseous and  she prefers something weaker; Norco/Vicodin 5-325 q4 hrs prn for pain has been ordered   -blood cx if fever >101    -tylenol as needed if fever >101   2. Demand ischemia 2/2 urosepsis.  Positive iSTAT troponins were documented in the ED; pt complained of no chest pain and EKG showed sinus rhythm and RAD with no ST elevation.  Cardiac enzymes were elevated when checked last night and repeat EKG was also NSR with no ST elevation.  Cardiology consult suggests this was demand ischemia from urosepsis.   -cardiac enzymes elevated (0.37, 0.55, 0.55,  0.45)   -monitor on telemetry    -continue home aspirin 81 mg, statin, will add beta blocker as blood pressure stabilizes   -cardiology spoke with patient regarding stress test or cath tomorrow.  Pt chose cath.  3. Bipolar disorder: pt appears stable, will continue home fluoxetine 60mg  daily 4. Hyperlipidemia: continue atorvastatin 5. Hypokalemia: received 2 runs of K in the ED.   -potassium today 3.1, will give 2 doses 40 mg today   -repeat BMP in AM  6. ADD: history of ADD and take Adderall "as needed", per patient report. We will not continue during hospitalization  7. Hypertension: BP stable on admission   -continue home losartan/HCTZ    -held home metoprolol because was hypotensive, will monitor and restart when BP  stabilizes.        8.  ADD: history of ADD and takes Adderall "as needed", per patient report.    -Will not continue this during hospitalization         9.  PPx: heparin      10.  FEN/GI: clear liquid diet, advance as tolerated   -IV NS 75 ml/hr    -zofran prn    -miralax prn  10. Disposition: pending overall clinical improvement including improvement of pain, tolerating PO and further work up including MI rule-out 11. Code Status: full code  Georgina Pillion, Med Student 03/25/2012, 4:10 PM  PGY-2 Addendum I have seen and examined patient. I agree with medical student note above. In brief, this is a 78 yo F who presented with 3 days of worsening abdominal pain, fevers and N/V who was found to have pyelonephritis on admission. Overall, patient is feeling better this morning. Pain has improved. She denies any urinary symptoms. O:  Filed Vitals:   03/25/12 1326  BP: 114/61  Pulse: 65  Temp: 98.1 F (36.7 C)  Resp: 18  Gen: Awake, alert. NAD HEENT: AT, [place] Cardio: RRR Pulm: CTAB anterior and laterally Abd: Diffusely tender, but most notable in LLQ and suprapubic. No CVA tenderness on my exam Neuro: grossly intact   A/P: Patient developed borderline  hypotension, had a leukocytosis and was febrile on admission with a source, therefore meets criteria for urosepsis at admission. She had elevated troponin likely secondary to cardiac strain from her illness. She was started on Cipro (given her severe reaction to penicillins). Unfortunately, despite direct contact with nursing staff, her urine culture and gram stain were NOT collected as ordered. Therefore, we do not have any direct guidance for our therapy. - Continue cipro IV and monitor for improvement. Will transition to PO after at least 24-48 hours of IV treatment with demonstrated improvement - Cardiology consulted for elevated troponin, which is now trending down. Patient will have cardiac cath tomorrow. - Continue to replete K with PO x2. Recheck bmet in the AM - Continue home psych medications - Discharge pending further improvement.  Carletta Feasel M.  Nadiya Pieratt, M.D.  03/25/2012 4:20 PM

## 2012-03-25 NOTE — Progress Notes (Signed)
ANTICOAGULATION CONSULT NOTE - Initial Consult  Pharmacy Consult for Heparin Indication: chest pain/ACS  Allergies  Allergen Reactions  . Amoxicillin Anaphylaxis    REACTION: unspecified  . Penicillins Anaphylaxis  . Morphine Sulfate Other (See Comments)    unknown  . Phenobarbital Other (See Comments)    unknown  . Phenytoin Other (See Comments)    unknown    Patient Measurements: Height: 5\' 5"  (165.1 cm) Weight: 228 lb 6.4 oz (103.602 kg) IBW/kg (Calculated) : 57  Heparin Dosing Weight: 80   Vital Signs: Temp: 98.2 F (36.8 C) (08/13 2133) Temp src: Oral (08/13 2133) BP: 102/62 mmHg (08/13 2133) Pulse Rate: 68  (08/13 2133)  Labs:  Basename 03/24/12 2203 03/24/12 1928 03/24/12 1628 03/24/12 0835  HGB -- -- -- 13.7  HCT -- -- -- 38.9  PLT -- -- -- 204  APTT -- -- -- 29  LABPROT -- -- -- 14.6  INR -- -- -- 1.12  HEPARINUNFRC -- -- -- --  CREATININE -- 1.00 -- 0.79  CKTOTAL 117 -- 111 --  CKMB 3.0 -- 2.8 --  TROPONINI 0.55* -- 0.37* --    Estimated Creatinine Clearance: 54.4 ml/min (by C-G formula based on Cr of 1).   Medical History: Past Medical History  Diagnosis Date  . Depression   . Ovarian cyst     Medications:  Prescriptions prior to admission  Medication Sig Dispense Refill  . amphetamine-dextroamphetamine (ADDERALL, 10MG ,) 10 MG tablet Take 1 tablet (10 mg total) by mouth daily.  90 tablet  0  . aspirin 81 MG EC tablet Take 1 tablet (81 mg total) by mouth daily. Swallow whole.  30 tablet  12  . atorvastatin (LIPITOR) 10 MG tablet Take 1 tablet (10 mg total) by mouth at bedtime.  90 tablet  3  . FLUoxetine (PROZAC) 20 MG capsule       . ibuprofen (ADVIL,MOTRIN) 800 MG tablet Take 800 mg by mouth every 8 (eight) hours as needed. For pain      . losartan-hydrochlorothiazide (HYZAAR) 100-25 MG per tablet TAKE 1 TABLET BY MOUTH DAILY  30 tablet  0  . metoprolol succinate (TOPROL-XL) 50 MG 24 hr tablet Take 1 tablet (50 mg total) by mouth daily.  Take with or immediately following a meal.  90 tablet  3  . acyclovir (ZOVIRAX) 5 % ointment Apply 1 application topically as needed. For herpes      . albuterol (VENTOLIN HFA) 108 (90 BASE) MCG/ACT inhaler Inhale 1 puff into the lungs every 6 (six) hours as needed. q4-6 prn         Assessment: 78 yo female with elevated cardiac markers for Heparin  Goal of Therapy:  Heparin level 0.3-0.7 units/ml Monitor platelets by anticoagulation protocol: Yes   Plan:  Heparin 3000 units IV bolus, then 1200 units/hr Check heparin level in 8 hours.   Miyoko Hashimi, Gary Fleet 03/25/2012,1:10 AM

## 2012-03-25 NOTE — Progress Notes (Signed)
RN attempted to hang pt's heparin dose when trying to flush the IV the RN noticed that the IV site was occluded. RN attempted twice and had another RN to check for an IV site. RN called IV team and is waiting for them to get the pt a new IV site.

## 2012-03-25 NOTE — Progress Notes (Signed)
Pt was complaining of a headache. RN paged MD on call. RN will continue to monitor pt and wait for orders from MD

## 2012-03-26 ENCOUNTER — Encounter (HOSPITAL_COMMUNITY)
Admission: EM | Disposition: A | Discharge status: Home / Self Care | Payer: Self-pay | Source: Home / Self Care | Attending: Family Medicine

## 2012-03-26 DIAGNOSIS — N10 Acute tubulo-interstitial nephritis: Secondary | ICD-10-CM | POA: Diagnosis not present

## 2012-03-26 DIAGNOSIS — I214 Non-ST elevation (NSTEMI) myocardial infarction: Secondary | ICD-10-CM

## 2012-03-26 DIAGNOSIS — A419 Sepsis, unspecified organism: Secondary | ICD-10-CM | POA: Diagnosis not present

## 2012-03-26 HISTORY — PX: LEFT HEART CATHETERIZATION WITH CORONARY ANGIOGRAM: SHX5451

## 2012-03-26 LAB — URINE CULTURE: Colony Count: NO GROWTH

## 2012-03-26 LAB — CBC
HCT: 33.8 % — ABNORMAL LOW (ref 36.0–46.0)
MCH: 30.6 pg (ref 26.0–34.0)
MCHC: 33.7 g/dL (ref 30.0–36.0)
RBC: 3.73 MIL/uL — ABNORMAL LOW (ref 3.87–5.11)
WBC: 7.1 10*3/uL (ref 4.0–10.5)

## 2012-03-26 LAB — BASIC METABOLIC PANEL
BUN: 15 mg/dL (ref 6–23)
CO2: 23 mEq/L (ref 19–32)
Calcium: 8.6 mg/dL (ref 8.4–10.5)
GFR calc non Af Amer: 66 mL/min — ABNORMAL LOW (ref >90)
Glucose, Bld: 119 mg/dL — ABNORMAL HIGH (ref 70–99)
Potassium: 4.3 mEq/L (ref 3.5–5.1)

## 2012-03-26 LAB — HEPARIN LEVEL (UNFRACTIONATED): Heparin Unfractionated: 0.25 IU/mL — ABNORMAL LOW (ref 0.30–0.70)

## 2012-03-26 SURGERY — LEFT HEART CATHETERIZATION WITH CORONARY ANGIOGRAM
Anesthesia: LOCAL

## 2012-03-26 MED ORDER — LIDOCAINE HCL (PF) 1 % IJ SOLN
INTRAMUSCULAR | Status: AC
  Filled 2012-03-26: qty 30

## 2012-03-26 MED ORDER — HEPARIN (PORCINE) IN NACL 2-0.9 UNIT/ML-% IJ SOLN
INTRAMUSCULAR | Status: AC
  Filled 2012-03-26: qty 2000

## 2012-03-26 MED ORDER — METOPROLOL SUCCINATE ER 25 MG PO TB24
25.0000 mg | ORAL_TABLET | Freq: Every day | ORAL | Status: DC
  Administered 2012-03-26 – 2012-03-27 (×2): 25 mg via ORAL
  Filled 2012-03-26 (×3): qty 1

## 2012-03-26 MED ORDER — MIDAZOLAM HCL 2 MG/2ML IJ SOLN
INTRAMUSCULAR | Status: AC
  Filled 2012-03-26: qty 2

## 2012-03-26 MED ORDER — SODIUM CHLORIDE 0.9 % IV SOLN: INTRAVENOUS | Status: AC

## 2012-03-26 MED ORDER — POLYETHYLENE GLYCOL 3350 17 G PO PACK
17.0000 g | PACK | Freq: Three times a day (TID) | ORAL | Status: DC | PRN
  Administered 2012-03-27: 17 g via ORAL
  Filled 2012-03-26: qty 1

## 2012-03-26 MED ORDER — CIPROFLOXACIN HCL 500 MG PO TABS
500.0000 mg | ORAL_TABLET | Freq: Two times a day (BID) | ORAL | Status: DC
  Administered 2012-03-26 – 2012-03-27 (×2): 500 mg via ORAL
  Filled 2012-03-26 (×5): qty 1

## 2012-03-26 MED ORDER — NITROGLYCERIN 0.2 MG/ML ON CALL CATH LAB
INTRAVENOUS | Status: AC
  Filled 2012-03-26: qty 1

## 2012-03-26 MED ORDER — ONDANSETRON HCL 4 MG/2ML IJ SOLN: 4.0000 mg | Freq: Four times a day (QID) | INTRAMUSCULAR | Status: DC | PRN

## 2012-03-26 MED ORDER — ACETAMINOPHEN 325 MG PO TABS
650.0000 mg | ORAL_TABLET | ORAL | Status: DC | PRN
  Administered 2012-03-26 – 2012-03-27 (×3): 650 mg via ORAL

## 2012-03-26 MED ORDER — MORPHINE SULFATE 2 MG/ML IJ SOLN
1.0000 mg | Freq: Once | INTRAMUSCULAR | Status: AC
  Administered 2012-03-26: 1 mg via INTRAVENOUS
  Filled 2012-03-26: qty 1

## 2012-03-26 NOTE — ED Provider Notes (Signed)
I saw and evaluated the patient, reviewed the resident's note and I agree with the findings and plan.  Alyxandria Wentz, MD 03/26/12 1708 

## 2012-03-26 NOTE — CV Procedure (Signed)
  Cardiac Catheterization Procedure Note  Name: [name] MRN: [id number] DOB: [birth year]  Procedure: Left Heart Cath, Selective Coronary Angiography, LV angiography  Indication:   NQWMI,  Chest pain.  Procedural details: The right groin was prepped, draped, and anesthetized with 1% lidocaine. Using modified Seldinger technique, a 5 French sheath was introduced into the right femoral artery. Standard Judkins catheters were used for coronary angiography and left ventriculography. Catheter exchanges were performed over a guidewire. There were no immediate procedural complications. The patient was transferred to the post catheterization recovery area for further monitoring.  Procedural Findings:   Hemodynamics:     AO 150/63     LV 164/12   Coronary angiography:   Coronary dominance: Right  Left mainstem:   Normal  Left anterior descending (LAD):   Mild diffuse luminal irregularities.  Mid diagonal moderate sized branching and normal.  Left circumflex (LCx):  AV groove mild luminal irregularities.  Ramus intermedius small and normal.  MOM large and normal.  Right coronary artery (RCA):  Large vessel.  Scattered focal 25% lesions throughout.  Large acute marginal normal.  PDA off of AM normal.  Small posterolaterals x 2 normal.  Left ventriculography: Left ventricular systolic function is normal, LVEF is estimated at 65%, there is no significant mitral regurgitation   Final Conclusions:  Mild coronary plaque.  Normal LV function.    Recommendations:   Continued risk reduction.    Rollene Rotunda 03/26/2012, 9:59 AM

## 2012-03-26 NOTE — Progress Notes (Signed)
Family Medicine Teaching Service Attending Note  I interviewed and examined patient [name] and reviewed their tests and x-rays.  I discussed with Dr. Fara Boros and reviewed their note for today.  I agree with their assessment and plan.     Additionally  Feels much better Mostly lowback pain and abdomen fullness "I need a bowel movement" Abdomen - no LLQ tenderness where had before.  Mild left CVAT  Cath noted Change to oral cipro and plan discharge in am if remains improved.  If any focal abdomen pain consider adding flagyl to cover for possible diverticulits since can't prove UTI since culture was not done in ER

## 2012-03-26 NOTE — Discharge Summary (Signed)
Physician Discharge Summary  Patient ID: [name] MRN: [id number] DOB/AGE: 1931/07/24 78 y.o.  Admit date: 03/24/2012 Discharge date: 04/03/2012  Admission Diagnoses: Abdominal pain   Discharge Diagnoses:  Principal Problem:  *Pyelonephritis  Active Problems:  BIPOLAR DISORDER  HYPERTENSION, BENIGN SYSTEMIC  ADD (attention deficit disorder)  NSTEMI (non-ST elevated myocardial infarction)  Hypokalemia  Hospital Course:  [name] is a 78 yo female who presented to the ED with three days of intermittent, sharp LLQ abdominal pain along with bilious vomiting, fever, and chills over the past 1 day.  She was found to be in urosepsis (based on fever, hypotension, leukocytosis and a urinary source based on CT scan) and also found to have elevated cardiac enzymes.  Hospital course by problem as follows:  1)  Urosepsis:  Pt presented febrile and hypotensive with leukocytosis and a UTI, supportive of urosepsis.  CT showed findings supportive of pyelonephritis as well as mild inflammation that could be indicative of diverticulitis.  Due to PCN allergy, pt was started on Ciprofloxacin 400 mg IV q 12 hours then transitioned to PO 500 mg BID as tolerated for urosepsis and pyelonephritis.  Due to improvement in symptoms, resolution of sepsis, and lack of urine cx and gram stain due to never being collected, pt was continued on Ciprofloxacin PO.  We felt this was less likely to be diverticulitis given stable hemoglobin, negative fecal occult blood test, and resolution of leukocytosis after Ciprofloxacin treatment.  Pt was given morphine 2mg  q 2 hrs prn for abdominal pain, transitioned to Norco as patient preferred, then Tylenol.  Abdominal pain resolved and patient clinically improved to baseline.  Pt had one fever on 03/26/12 to 101.  Due to normal white count and lack of fever throughout the following day, fever was attributed to cath procedure on Thursday (see below.)  Repeat UA was negative  leukocytes so although initial cx was not performed, clinical improvement with less abdominal pain and normal WBC count along with negative UA indicate that the initial infection is being treated appropriately.  Pt discharged with a 10-day course of Ciprofloxacin and close follow-up with PCP.  2) Demand ischemia 2/2 urosepsis: elevated iSTAT troponins in the ED prompted cycling cardiac enzymes which were elevated at (0.37, 0.55, 0.55, 0.45).  EKG in the ED and repeat EKG later that night when cardiac enzymes were found elevated were both unremarkable with normal sinus rhythm and no ST elevation.  Pt had no chest pain or shortness of breath but did have mild "heart burn" and pain in right shoulder that resolved with Tums.  Cardiology was consulted and conducted a cath procedure on 8/15, which was unremarkable.  Cardiology said no further work-up was necessary.  Cautioned regarding usual use of 600 mg Ibuprofen daily due to potential cardiac risk, patient told to use tylenol as necessary and readdress at follow-up with PCP.  3) Bipolar disorder: pt was stable during admission.  Note pt had to give permission to euthanize elderly dog upon arrival to ED.  Home fluoxetine 60 mg daily continued.  4) Hyperlipidemia: continued atorvastatin  5) Hyperkalemia: received 2 doses of 10 mEq of K in the ED for a K of 2.9, then 2 doses of 40 mEq of K.  Final K was 4.3  6) ADD: hx of ADD and takes Adderall at home "as needed."  Did not restart during inpatient course, but can resume at discharge.  7) HTN: BP initially stable on admission then became hypotensive.  Held home losartan/HCTZ and  metoprolol initially, then restarted to half of home dose (Toprol XL 25) on 8/15 due to maintenance of high BP and added losartan/HCTZ on Friday due to consistently elevated pressures.  8) HA: pt complained of starting Friday, said it was present since admission.  Is only present when she sits up in bed and is described as a  "pressure" in the front of her head and sinuses.  Due to absence of neurological signs and improvement with Tylenol treatment along with lack of sinus infection symptoms or sinus tenderness, was attributed to hypertension and overall resolution of sepsis.  9) Constipation: pt treated with Miralax and Colace and had 2 BMs on Friday (previous BM was Sunday).  Pt has not been constipated before, likely due to morphine given while inpatient.     Discharge Exam: Blood pressure 149/74, pulse 69, temperature 98.1 F (36.7 C), temperature source Oral, resp. rate 20, height 5\' 5"  (1.651 m), weight 230 lb 13.2 oz (104.7 kg), SpO2 94.00%.  Disposition: 01-Home or Self Care  Discharge Orders    Future Appointments: Provider: Department: Dept Phone: Center:   04/08/2012 1:30 PM Sanjuana Letters, MD [facility] [phone] [facility]     Future Orders Please Complete By Expires   Diet - low sodium heart healthy      Increase activity slowly        Medication List  As of 04/03/2012 12:05 PM   STOP taking these medications         ibuprofen 800 MG tablet         TAKE these medications         acetaminophen 325 MG tablet   Commonly known as: TYLENOL   Take 2 tablets (650 mg total) by mouth every 6 (six) hours as needed (or Fever >/= 101).      acyclovir ointment 5 %   Commonly known as: ZOVIRAX   Apply 1 application topically as needed. For herpes      amphetamine-dextroamphetamine 10 MG tablet   Commonly known as: ADDERALL   Take 1 tablet (10 mg total) by mouth daily.      aspirin 81 MG EC tablet   Take 1 tablet (81 mg total) by mouth daily. Swallow whole.      atorvastatin 10 MG tablet   Commonly known as: LIPITOR   Take 1 tablet (10 mg total) by mouth at bedtime.      ciprofloxacin 500 MG tablet   Commonly known as: CIPRO   Take 1 tablet (500 mg total) by mouth 2 (two) times daily.      FLUoxetine 20 MG capsule   Commonly known as: PROZAC       losartan-hydrochlorothiazide 100-25 MG per tablet   Commonly known as: HYZAAR   TAKE 1 TABLET BY MOUTH DAILY      metoprolol succinate 50 MG 24 hr tablet   Commonly known as: TOPROL-XL   Take 1 tablet (50 mg total) by mouth daily. Take with or immediately following a meal.      VENTOLIN HFA 108 (90 BASE) MCG/ACT inhaler   Generic drug: albuterol   Inhale 1 puff into the lungs every 6 (six) hours as needed. q4-6 prn           Follow-up Information    Follow up with Sanjuana Letters, MD. Schedule an appointment as soon as possible for a visit in 1 week.   Contact information:   [address] [place] [address] [phone]  Recommendations for follow-up: - Please follow up symptoms and resolution of pain with completed course of Cipro - Cardiology did consult, but no further recs. Can consider outpatient cardiology follow up, if appropriate  Signed: HAIR[place]ORD, AMBER 04/03/2012, 12:05 PM

## 2012-03-26 NOTE — Progress Notes (Signed)
Patient ID: [name], female   DOB: 06-07-1931, 78 y.o.   MRN: [id number] Family Medicine Teaching Service Daily Progress Note Service Page: [phone]  Subjective: Pt had pain overnight, gave morphine because NPO for cath today.  She says her abdominal pain is improved from when she was admitted, though it is still present.  She still feels "bloated."  She says she has not had a bowel movement since Sunday and a bowel movement may resolve her pain.   Objective: Temp:  [98.1 F (36.7 C)-99.7 F (37.6 C)] 99.7 F (37.6 C) (08/15 0601) Pulse Rate:  [65-80] 72  (08/15 0601) Resp:  [18-24] 24  (08/15 0601) BP: (114-166)/(61-77) 156/77 mmHg (08/15 0601) SpO2:  [93 %-97 %] 95 % (08/15 0601) Weight:  [230 lb 13.2 oz (104.7 kg)] 230 lb 13.2 oz (104.7 kg) (08/15 0534) Exam: General: well-appearing 78 yo female Cardiovascular: regular rate and rhythm Respiratory: no crackles or wheezes Abdomen: slightly tender to palpation in the LLQ, hard to judge with exam as patient upset about lying flat after cath and needed to urinate.  Overall improved per patient.  Not distended, no rebound tenderness. Extremities: no LE edema  I have reviewed the patient's medications, labs, imaging, and diagnostic testing.  Notable results are summarized below.  CBC BMET   Lab 03/26/12 0630 03/25/12 0355 03/24/12 0835  WBC 7.1 15.7* 13.2*  HGB 11.4* 11.4* 13.7  HCT 33.8* 33.0* 38.9  PLT 165 172 204    Lab 03/26/12 0630 03/25/12 0355 03/24/12 1928 03/24/12 0835  NA 138 135 -- 136  K 4.3 3.1* -- 2.9*  CL 106 99 -- 101  CO2 23 25 -- 21  BUN 15 21 -- 19  CREATININE 0.82 1.03 1.00 --  GLUCOSE 119* 121* -- 133*  CALCIUM 8.6 8.4 -- 9.5     Imaging/Diagnostic Tests:  CT abdomen: Mild inflammatory changes along the left pericolic gutter and along  the left lateral perinephric in the perirenal spaces. There are  diverticula along the descending sigmoid colon. This could reflect  a mild uncomplicated  descending colon diverticulitis. Consider  pyelonephritis in the proper clinical setting. No abscess or  extraluminal air. No other evidence of an acute abnormality.  Patient Assessment: [name] is a 78 y.o. year old female presenting with three days of sharp LLQ pain with fever and chills and one day of bilious vomiting who has improved clinically after treatment with Ciprofloxacin for 36 hours.  Plan:  1. Urosepsis: Hypotension, fever, and leukocytosis in the setting of a UTI supported by UA positive for nitrites, leukocytes, and bacteria supports the diagnosis of urosepsis. CT showed changes that could support pyelonephritis in the appropriate clinical setting, and also showed multiple diverticula. This is less likely diverticulitis due to the mild nature of the CT changes, negative fecal occult blood test, and stable hemoglobin. -switch to Ciprofloxacin 500 BID PO for continued treatment of urosepsis as patient has a PCN allergy. Will continue this treatment as pt has become afebrile and is clinically improving. Urine cx and gram stain not obtained in ED due to system error. Patient safety report completed. -since abdominal pain still mild, will consider discharging with a course of Flagyl to cover possible diverticulitis as source of her abdominal pain -d/c morphine because patient says it makes her nauseous and she prefers something weaker; Norco/Vicodin 5-325 q4 hrs prn for pain has been ordered  -blood cx if fever >101  -tylenol as needed if fever >101   2. Demand  ischemia 2/2 urosepsis. Positive iSTAT troponins were documented in the ED; pt complained of no chest pain and EKG showed sinus rhythm and RAD with no ST elevation. Cardiac enzymes were elevated when checked last night and repeat EKG was also NSR with no ST elevation. Cardiology consult suggests this was demand ischemia from urosepsis. -cardiac enzymes elevated (0.37, 0.55, 0.55, 0.45)  -monitor on telemetry  -continue home  aspirin 81 mg, statin, will add beta blocker as blood pressure stabilizes  -cardiology performed cath today: no significant findings.   3. Bipolar disorder: pt appears stable, will continue home fluoxetine 60mg  daily 4. Hyperlipidemia: continue atorvastatin 5. Hypokalemia: received 2 runs of K in the ED. -potassium today 4.3 after 2 doses 40 mg yesterday -repeat BMP in AM   6. ADD: history of ADD and take Adderall "as needed", per patient report. We will not continue during hospitalization  7. Hypertension: BP stable on admission -continue home losartan/HCTZ  -held home metoprolol because was hypotensive, will restart today at lower dose of Metoprolol 25 XL as BP 114-166/61-77, most recent 143/77 at 0838.  8. ADD: history of ADD and takes Adderall "as needed", per patient report.  -Will not continue this during hospitalization   9. PPx: heparin  10. FEN/GI: NPO for cath today -IV NS 75 ml/hr  -zofran prn  -miralax prn   10. Disposition: pending overall clinical improvement  11. Code Status: full code   Georgina Pillion, Med Student 03/26/2012, 2:00 PM  UPPER LEVEL ADDENDUM  I have seen and examined [name] with Georgina Pillion, MS4 and I agree with the above assessment/plan. I have reviewed all available data and have made any necessary changes. Briefly, [name] is feeling much better today overall, but does feel bloated, like she needs to have a bowel movement.  PE: Gen: NAD, laying in bed comfortably Abd: soft, normoactive BS, mild TTP throughout  A/P: 78 yo F w/ LLQ pain and urosepsis improving on antibiotics  1. Urosepsis/abdominal pain: -continues to improve on cipro, will transition to po today -consider adding flagyl if abdominal pain continues since some ?diverticulitis on CT abdomen    2. Elevated troponins: 2/2 demand ischemia from urosepsis -cardiology consulted, greatly appreciate their assistance -s/p cath this AM-- mild plaque seen   3.  Constipation: -miralax   Dispo: pt feels she will be ready for d/c in the morning once she has a BM   Ahley Bulls 03/26/2012, 3:40 PM

## 2012-03-26 NOTE — Progress Notes (Signed)
1730 Dr. Fara Boros made aware of blood pressure no new orders

## 2012-03-26 NOTE — Progress Notes (Signed)
ANTICOAGULATION CONSULT NOTE - Follow Up Consult  Pharmacy Consult for UFH Indication: NSTEMI  Allergies  Allergen Reactions  . Amoxicillin Anaphylaxis    REACTION: unspecified  . Penicillins Anaphylaxis  . Morphine Sulfate Other (See Comments)    unknown  . Phenobarbital Other (See Comments)    unknown  . Phenytoin Other (See Comments)    unknown    Patient Measurements: Height: 5\' 5"  (165.1 cm) Weight: 230 lb 13.2 oz (104.7 kg) IBW/kg (Calculated) : 57  Heparin Dosing Weight: 80kg  Vital Signs: Temp: 99.7 F (37.6 C) (08/15 0601) Temp src: Oral (08/15 0601) BP: 156/77 mmHg (08/15 0601) Pulse Rate: 72  (08/15 0601)  Labs:  Basename 03/26/12 0630 03/25/12 1933 03/25/12 0928 03/25/12 0927 03/25/12 0355 03/24/12 2203 03/24/12 1928 03/24/12 0835  HGB 11.4* -- -- -- 11.4* -- -- --  HCT 33.8* -- -- -- 33.0* -- -- 38.9  PLT 165 -- -- -- 172 -- -- 204  APTT -- -- -- -- -- -- -- 29  LABPROT -- -- -- -- -- -- -- 14.6  INR -- -- -- -- -- -- -- 1.12  HEPARINUNFRC 0.25* 0.14* -- 0.14* -- -- -- --  CREATININE -- -- -- -- 1.03 -- 1.00 0.79  CKTOTAL -- -- 106 -- 107 117 -- --  CKMB -- -- 3.4 -- 3.1 3.0 -- --  TROPONINI -- -- 0.43* -- 0.55* 0.55* -- --    Estimated Creatinine Clearance: 53.2 ml/min (by C-G formula based on Cr of 1.03).   Medications:  Scheduled:     . aspirin  324 mg Oral Pre-Cath  . aspirin EC  81 mg Oral Daily  . atorvastatin  10 mg Oral QHS  . ciprofloxacin  400 mg Intravenous Q12H  . diazepam  2 mg Oral On Call  . FLUoxetine  60 mg Oral Daily  . heparin  3,000 Units Intravenous Once  .  morphine injection  1 mg Intravenous Once  . potassium chloride  40 mEq Oral BID  . sodium chloride  3 mL Intravenous Q12H  . DISCONTD: aspirin  81 mg Oral Daily  . DISCONTD: FLUoxetine  20 mg Oral Daily  . DISCONTD: potassium chloride  20 mEq Oral TID   Infusions:     . sodium chloride 75 mL/hr at 03/25/12 1517  . sodium chloride 1 mL/kg/hr (03/26/12 0428)    . heparin 1,700 Units/hr (03/25/12 2109)    Assessment: Heparin remains just below goal 0.25 (goal 0.3-0.7) due for cath at 1030.   Goal of Therapy:  Heparin level 0.3-0.7 units/ml Monitor platelets by anticoagulation protocol: Yes   Plan:  Increase to 1900 units/hr until off for cath. And f/u post cath   Janice Coffin

## 2012-03-26 NOTE — ED Provider Notes (Signed)
I saw and evaluated the patient, reviewed the resident's note and I agree with the findings and plan.  Derwood Kaplan, MD 03/26/12 1708

## 2012-03-26 NOTE — Progress Notes (Signed)
See Dr Leveda Anna Note for today

## 2012-03-27 DIAGNOSIS — I214 Non-ST elevation (NSTEMI) myocardial infarction: Secondary | ICD-10-CM | POA: Diagnosis not present

## 2012-03-27 DIAGNOSIS — N12 Tubulo-interstitial nephritis, not specified as acute or chronic: Secondary | ICD-10-CM | POA: Diagnosis not present

## 2012-03-27 LAB — URINALYSIS, ROUTINE W REFLEX MICROSCOPIC
Bilirubin Urine: NEGATIVE
Hgb urine dipstick: NEGATIVE
Ketones, ur: NEGATIVE mg/dL
Nitrite: NEGATIVE
Protein, ur: NEGATIVE mg/dL
Specific Gravity, Urine: 1.008 (ref 1.005–1.030)
Urobilinogen, UA: 1 mg/dL (ref 0.0–1.0)

## 2012-03-27 LAB — CBC
MCHC: 35.1 g/dL (ref 30.0–36.0)
MCV: 88.9 fL (ref 78.0–100.0)
Platelets: 166 10*3/uL (ref 150–400)
RDW: 13.5 % (ref 11.5–15.5)
WBC: 5.5 10*3/uL (ref 4.0–10.5)

## 2012-03-27 LAB — GRAM STAIN

## 2012-03-27 MED ORDER — POLYETHYLENE GLYCOL 3350 17 G PO PACK: 17.0000 g | PACK | Freq: Every day | ORAL | Status: AC | PRN

## 2012-03-27 MED ORDER — SENNOSIDES-DOCUSATE SODIUM 8.6-50 MG PO TABS
1.0000 | ORAL_TABLET | Freq: Two times a day (BID) | ORAL | Status: DC
  Administered 2012-03-27: 1 via ORAL
  Filled 2012-03-27: qty 1

## 2012-03-27 MED ORDER — ACETAMINOPHEN 325 MG PO TABS: 650.0000 mg | ORAL_TABLET | Freq: Four times a day (QID) | ORAL | Status: DC | PRN

## 2012-03-27 MED ORDER — CIPROFLOXACIN HCL 500 MG PO TABS: 500.0000 mg | ORAL_TABLET | Freq: Two times a day (BID) | ORAL | Status: AC

## 2012-03-27 MED ORDER — POLYETHYLENE GLYCOL 3350 17 G PO PACK
17.0000 g | PACK | Freq: Once | ORAL | Status: DC
  Filled 2012-03-27: qty 1

## 2012-03-27 MED ORDER — IBUPROFEN 600 MG PO TABS
600.0000 mg | ORAL_TABLET | Freq: Four times a day (QID) | ORAL | Status: DC | PRN
  Filled 2012-03-27: qty 1

## 2012-03-27 MED ORDER — HYDROCHLOROTHIAZIDE 25 MG PO TABS
25.0000 mg | ORAL_TABLET | Freq: Every day | ORAL | Status: DC
  Administered 2012-03-27: 25 mg via ORAL
  Filled 2012-03-27: qty 1

## 2012-03-27 MED ORDER — LOSARTAN POTASSIUM 50 MG PO TABS
100.0000 mg | ORAL_TABLET | Freq: Every day | ORAL | Status: DC
  Administered 2012-03-27: 100 mg via ORAL
  Filled 2012-03-27: qty 2

## 2012-03-27 NOTE — Progress Notes (Signed)
Family Medicine Teaching Service Attending Note  I interviewed and examined patient [name] and reviewed their tests and x-rays.  I discussed with Dr. Mikel Cella and reviewed their note for today.  I agree with their assessment and plan.      Additionally This AM was feeling well with only mild LLQ tenderness.  Had not had bowel movement.  No chills or nausea or vomiting or lightheadness Isolated fever noted  Difficult situation because do not have a urine culture Would recheck UA  If she feels well today and is able to ambulate and have bowel movement without any further fever I feel is best course to discharge her on Cipro for presumed sensitive UTI.   If UA shows large wbc would reculture and consider broadening antibiotics

## 2012-03-27 NOTE — Progress Notes (Signed)
   SUBJECTIVE:  Headache.  No chest pain.   PHYSICAL EXAM Filed Vitals:   03/26/12 1630 03/26/12 1700 03/26/12 2025 03/27/12 0551  BP: 190/71 158/80 146/68 175/88  Pulse: 80  63 73  Temp: 100.2 F (37.9 C)  101 F (38.3 C) 98.5 F (36.9 C)  TempSrc: Oral  Oral Oral  Resp: 20  20 18   Height:      Weight:      SpO2: 92%  93% 94%   General:  No distress Lungs:  Clear Heart:  RRR Abdomen:  Positive bowel sounds, no rebound no guarding Extremities:  Right groin without hematoma or bruit.  LABS: Lab Results  Component Value Date   CKTOTAL 106 03/25/2012   CKMB 3.4 03/25/2012   TROPONINI 0.43* 03/25/2012   Results for orders placed during the hospital encounter of 03/24/12 (from the past 24 hour(s))  CBC     Status: Abnormal   Collection Time   03/27/12  7:00 AM      Component Value Range   WBC 5.5  4.0 - 10.5 K/uL   RBC 3.78 (*) 3.87 - 5.11 MIL/uL   Hemoglobin 11.8 (*) 12.0 - 15.0 g/dL   HCT 84.6 (*) 96.2 - 95.2 %   MCV 88.9  78.0 - 100.0 fL   MCH 31.2  26.0 - 34.0 pg   MCHC 35.1  30.0 - 36.0 g/dL   RDW 84.1  32.4 - 40.1 %   Platelets 166  150 - 400 K/uL    Intake/Output Summary (Last 24 hours) at 03/27/12 0825 Last data filed at 03/26/12 2300  Gross per 24 hour  Intake    110 ml  Output      0 ml  Net    110 ml    ASSESSMENT AND PLAN:   NSTEMI (non-ST elevated myocardial infarction):  Cath yesterday without obstructive disease.  Demand ischemia.  No further cardiac work up is indicated.  Call as needed.     Fayrene Fearing Heidie Krall 03/27/2012 8:25 AM

## 2012-03-27 NOTE — Progress Notes (Signed)
Patient ID: [name], female   DOB: Jun 04, 1931, 78 y.o.   MRN: [id number] Family Medicine Teaching Service Daily Progress Note Service Page: [phone]  Subjective:  [name] had a fever last night at 9pm to 101.  She says her abdominal pain is still present sometimes, but less than on admission.  She feels less bloated.  She still has not had a bowel movement despite miralax given yesterday and thinks this may resolve her abdominal symptoms.  Pt was rubbing abdomen throughout interview because she thinks this will help her have a BM.  Her main complaint this morning was a headache that occurs when she sits up.  The pain is in the front of her head and has occurred since her admission to the hospital.  Pain is absent when lying down and does not happen every time she sits up and has caused no vision changes.  She has not eaten breakfast today because she "doesn't feel like doing anything."    Objective: Temp:  [97.6 F (36.4 C)-101 F (38.3 C)] 98.5 F (36.9 C) (08/16 0551) Pulse Rate:  [61-81] 73  (08/16 0551) Resp:  [18-20] 18  (08/16 0551) BP: (143-190)/(68-88) 175/88 mmHg (08/16 0551) SpO2:  [92 %-99 %] 94 % (08/16 0551) FiO2 (%):  [1 %] 1 % (08/15 1128) Exam: General: pt appears improved from admission overall Cardiovascular: regular rate and rhythm Respiratory: no crackles or wheezes Abdomen: slight tenderness in LLQ, no rebound tenderness, bowel sounds present, nondistended Neuro: CN exam unremarkable, no sinus tenderness.  I have reviewed the patient's medications, labs, imaging, and diagnostic testing.  Notable results are summarized below.  CBC BMET   Lab 03/27/12 0700 03/26/12 0630 03/25/12 0355  WBC 5.5 7.1 15.7*  HGB 11.8* 11.4* 11.4*  HCT 33.6* 33.8* 33.0*  PLT 166 165 172    Lab 03/26/12 0630 03/25/12 0355 03/24/12 1928 03/24/12 0835  NA 138 135 -- 136  K 4.3 3.1* -- 2.9*  CL 106 99 -- 101  CO2 23 25 -- 21  BUN 15 21 -- 19  CREATININE 0.82 1.03 1.00 --    GLUCOSE 119* 121* -- 133*  CALCIUM 8.6 8.4 -- 9.5     Imaging/Diagnostic Tests: CT abdomen:  Mild inflammatory changes along the left pericolic gutter and along  the left lateral perinephric in the perirenal spaces. There are  diverticula along the descending sigmoid colon. This could reflect  a mild uncomplicated descending colon diverticulitis. Consider  pyelonephritis in the proper clinical setting. No abscess or  extraluminal air. No other evidence of an acute abnormality.   Plan: Patient Assessment: Caidence Avery is a 78 y.o. year old female presenting with three days of sharp LLQ pain with fever and chills and one day of bilious vomiting who has improved clinically after treatment with Ciprofloxacin.  Pt had a fever last night to 101 but abdominal pain less prominent and pt feels less distended.   Plan:  1. Urosepsis: Hypotension, fever, and leukocytosis in the setting of a UTI supported by UA positive for nitrites, leukocytes, and bacteria supports the diagnosis of urosepsis. CT showed changes that could support pyelonephritis in the appropriate clinical setting, and also showed multiple diverticula. This is less likely diverticulitis due to the mild nature of the CT changes, negative fecal occult blood test, and stable hemoglobin. 1. maintain Ciprofloxacin 500 BID PO for continued treatment of urosepsis as patient has a PCN allergy. Will continue this treatment as pt is clinically improving. Urine cx and gram  stain not obtained in ED due to system error. Patient safety report completed.  2. Fever overnight.  Still do not think diverticulitis is the problem due to stable Hb, no GI bleed, and improving abdominal pain.  Fever could be attributed to cath procedure yesterday.  Will monitor throughout the day and d/c if afebrile. 3. Pt takes Ibuprofen 600 mg daily for pain control at home.  Will give Tylenol here for pain to minimize cardiac and GI side effects. 4. blood cx if fever >101   5. tylenol as needed if fever >101   2. Demand ischemia 2/2 urosepsis. Positive iSTAT troponins were documented in the ED; pt complained of no chest pain and EKG showed sinus rhythm and RAD with no ST elevation. Cardiac enzymes were elevated when checked last night and repeat EKG was also NSR with no ST elevation. Cardiology consult suggests this was demand ischemia from urosepsis. 1. cardiac enzymes elevated (0.37, 0.55, 0.55, 0.45)  2. monitor on telemetry  3. continue home aspirin 81 mg, statin 4. cardiology performed cath Thursday: no significant findings, consultant signed off today saying no further work up needed.  3. Headache: present when pt sits up, is a frontal "pressure" not pain, no vision changes and has been present for the last few days.  Likely orthostatic from decreased PO vs hypertensive HA (last BP 175/88) vs sinus pressure. 1. Tylenol prn for pain.  4. Constipation: last BM Sunday 1. Miralax TID prn for constipation 2. Senna 1 tab BID  5. Bipolar disorder: pt appears stable, will continue home fluoxetine 60mg  daily 6. Hyperlipidemia: continue atorvastatin 7. Hypokalemia: received 2 runs of K in the ED. 1. potassium Thursday 4.3 after 2 doses 40 mg Wed 8. ADD: history of ADD and take Adderall "as needed", per patient report.  1. We will not continue during hospitalization  9. Hypertension: BP stable on admission 1. Most recent BP 175/88, pt complaining of headache 2. Restart home losartan/HCTZ as blood pressures are stable and increased. 3. held home metoprolol because was hypotensive, restarted Thursday at lower dose of Metoprolol 25 XL as BP 114-166/61-77 10.  ppx-heparin 11. FEN/GI: house diet, replete lytes prn 12: dispo: pending clinical improvement- less abdominal pain, afebrile 12. Code status: full code  [name], Med Student 03/27/2012, 8:01 AM  UPPER LEVEL ADDENDUM  I have seen and examined [name] with [name], MS4 and I agree  with the above assessment/plan. I have reviewed all available data and have made any necessary changes. Briefly, Ms. Pruitt is feeling much better today overall. She does report a headache and is still constipated. No chest pain, mild abd pain. She was febrile overnight to 101.  PE:  Gen: NAD, laying in bed comfortably. Very talkative Abd: soft, normoactive BS, mild TTP throughout  A/P: 78 yo F w/ LLQ pain and urosepsis improving on antibiotics   1. Urosepsis/abdominal pain:  -appears stable on cipro PO with one episode of fever, but normal white count, improvement of pain and clinically looks good. -will continue PO cipro for additional 10 days -recheck UA now  2. Elevated troponins: 2/2 demand ischemia from urosepsis  -appreciate cards consult. Cath was unremarkable  3. Constipation:  -miralax BID plus stool softener  Dispo: Given recent fever, will monitor throughout the day today. If remains afebrile, will discharge home with Cipro PO x10 days and close follow up with Dr. Leveda Anna in clinic.  Aydeen Blume M. Jatavian Calica, M.D. 03/27/2012 1:58 PM

## 2012-03-27 NOTE — Progress Notes (Signed)
[name] to be D/C'd Home per MD order.  Discussed with the patient and all questions fully answered.   [place], [place]  Home Medication Instructions [id number]   Printed on:03/27/12 1807  Medication Information                    albuterol (VENTOLIN HFA) 108 (90 BASE) MCG/ACT inhaler Inhale 1 puff into the lungs every 6 (six) hours as needed. q4-6 prn            acyclovir (ZOVIRAX) 5 % ointment Apply 1 application topically as needed. For herpes           amphetamine-dextroamphetamine (ADDERALL, 10MG ,) 10 MG tablet Take 1 tablet (10 mg total) by mouth daily.           atorvastatin (LIPITOR) 10 MG tablet Take 1 tablet (10 mg total) by mouth at bedtime.           aspirin 81 MG EC tablet Take 1 tablet (81 mg total) by mouth daily. Swallow whole.           metoprolol succinate (TOPROL-XL) 50 MG 24 hr tablet Take 1 tablet (50 mg total) by mouth daily. Take with or immediately following a meal.           losartan-hydrochlorothiazide (HYZAAR) 100-25 MG per tablet TAKE 1 TABLET BY MOUTH DAILY           FLUoxetine (PROZAC) 20 MG capsule            acetaminophen (TYLENOL) 325 MG tablet Take 2 tablets (650 mg total) by mouth every 6 (six) hours as needed (or Fever >/= 101).           ciprofloxacin (CIPRO) 500 MG tablet Take 1 tablet (500 mg total) by mouth 2 (two) times daily.           polyethylene glycol (MIRALAX / GLYCOLAX) packet Take 17 g by mouth daily as needed.             VVS, Skin clean, dry and intact without evidence of skin break down, no evidence of skin tears noted. IV catheter discontinued intact. Site without signs and symptoms of complications. Dressing and pressure applied.  An After Visit Summary was printed and given to the patient. Patient escorted via WC, and D/C home via private auto.  [name] D 03/27/2012 6:07 PM

## 2012-03-28 LAB — URINE CULTURE: Culture: NO GROWTH

## 2012-04-03 NOTE — Discharge Summary (Signed)
I have reviewed this discharge summary and agree.    

## 2012-04-08 ENCOUNTER — Other Ambulatory Visit: Payer: Self-pay | Admitting: Family Medicine

## 2012-04-08 ENCOUNTER — Encounter: Payer: Self-pay | Admitting: Family Medicine

## 2012-04-08 ENCOUNTER — Ambulatory Visit (INDEPENDENT_AMBULATORY_CARE_PROVIDER_SITE_OTHER): Payer: Medicare Other | Admitting: Family Medicine

## 2012-04-08 VITALS — BP 117/94 | HR 59 | Temp 98.1°F | Wt 219.0 lb

## 2012-04-08 DIAGNOSIS — N760 Acute vaginitis: Secondary | ICD-10-CM | POA: Insufficient documentation

## 2012-04-08 DIAGNOSIS — F988 Other specified behavioral and emotional disorders with onset usually occurring in childhood and adolescence: Secondary | ICD-10-CM | POA: Diagnosis not present

## 2012-04-08 DIAGNOSIS — N12 Tubulo-interstitial nephritis, not specified as acute or chronic: Secondary | ICD-10-CM | POA: Diagnosis not present

## 2012-04-08 LAB — POCT URINALYSIS DIPSTICK
Blood, UA: NEGATIVE
Protein, UA: NEGATIVE
Spec Grav, UA: <=1.005
Urobilinogen, UA: 0.2

## 2012-04-08 LAB — POCT UA - MICROSCOPIC ONLY

## 2012-04-08 MED ORDER — FLUCONAZOLE 150 MG PO TABS: 150.0000 mg | ORAL_TABLET | Freq: Once | ORAL | Status: AC

## 2012-04-08 MED ORDER — AMPHETAMINE-DEXTROAMPHETAMINE 10 MG PO TABS: 10.0000 mg | ORAL_TABLET | Freq: Every day | ORAL | Status: DC

## 2012-04-08 NOTE — Progress Notes (Signed)
  Subjective:    Patient ID: [name], female    DOB: [birth year], 78 y.o.   MRN: [id number]  HPI  Did not have a pleasant experience in the hospital.  She is stressed about the hospitalization and about world events.  Seems to have physically recovered.  Minor Left lower quadrent pain and afebrile.  Positive dysuria.  Not much frequency.  Believes she has a kidney infection.      Review of Systems     Objective:   Physical Exam Abd benign        Assessment & Plan:

## 2012-04-08 NOTE — Addendum Note (Signed)
Addended by: Briscoe Deutscher on: 04/08/2012 04:04 PM   Modules accepted: Orders

## 2012-04-08 NOTE — Patient Instructions (Signed)
I gave you a new prescription for your Adderall. I sent in a prescription for a pill to clear up the yeast infection. Your urine infection seems cleared up. See me in three months.   Sooner if the stress seems to be getting to you.

## 2012-04-08 NOTE — Assessment & Plan Note (Addendum)
Clinically resolving. Urine has cleared.

## 2012-04-11 ENCOUNTER — Other Ambulatory Visit: Payer: Self-pay | Admitting: Family Medicine

## 2012-05-20 ENCOUNTER — Other Ambulatory Visit: Payer: Self-pay | Admitting: Family Medicine

## 2012-05-25 ENCOUNTER — Ambulatory Visit (INDEPENDENT_AMBULATORY_CARE_PROVIDER_SITE_OTHER): Payer: Medicare Other | Admitting: *Deleted

## 2012-05-25 DIAGNOSIS — Z23 Encounter for immunization: Secondary | ICD-10-CM | POA: Diagnosis not present

## 2012-08-13 ENCOUNTER — Other Ambulatory Visit: Payer: Self-pay | Admitting: Family Medicine

## 2012-08-28 ENCOUNTER — Other Ambulatory Visit: Payer: Self-pay | Admitting: Family Medicine

## 2012-08-28 NOTE — Telephone Encounter (Signed)
Patient is calling because she doesn't feel like she should have to be seen for a refill on her Adderall and if she does have to be seen she will, but she won't be happy about it at all. She never gets a cold or the flu, she is very healthy, taking her BP everyday so she doesn't feel it is necessary for her to come in and waste tax payers dollars. She also wants him to know that she loves him and isn't mad at him. :-)  Ms. [name] called in at 9:56 this morning and left the message from above.  The message was attached to an electronic refill request for another medication, that now has been completed and closed, but the refill for Adderall has not been addressed.

## 2012-08-28 NOTE — Telephone Encounter (Signed)
Patient is calling because she doesn't feel like she should have to be seen for a refill on her Adderall and if she does have to be seen she will, but she won't be happy about it at all.  She never gets a cold or the flu, she is very healthy, taking her BP everyday so she doesn't feel it is necessary for her to come in and waste tax payers dollars.  She also wants him to know that she loves him and isn't mad at him.  :-)

## 2012-08-31 NOTE — Telephone Encounter (Signed)
Patient is calling because she hasn't heard anything about the refill on her Adderall.  She would like the nurse to call her back one way or the other on what she needs to do to get this medication.

## 2012-09-01 NOTE — Telephone Encounter (Signed)
Dear [facility] I am sorry her refill request got lost somehow---Dr. Leveda Anna is out of the office and I do not know her so I am not comfortable refilling her Adderall as she has not been SEEN in  Over 4 months. So she can either: 1) wait until next week when he is back or 2).  come be seen. She needs to be "monitored closely" which are Dr. Cyndia Skeeters words from, last office visit because he is using the Adderall atypically given her age and history of bipolar disorder.   She is going to have to choose one of those options as I am not comfortable refilling it otherwise. Sorry for the inconvenience.  I would remind her that she probably would have more success getting this kind of medicine refilled if she gave [place] a weeks notice in case Dr. Leveda Anna is out. THANKS! Denny Levy

## 2012-09-01 NOTE — Telephone Encounter (Signed)
Spoke with patient and informed her of below message. She satated that if she had know that Dr. Leveda Anna was out of town she would have called earlier for medication and not waited as long. She will call next week for an appointment with Dr. Leveda Anna, I did offer to make that for her but she declined

## 2012-09-11 ENCOUNTER — Ambulatory Visit (INDEPENDENT_AMBULATORY_CARE_PROVIDER_SITE_OTHER): Payer: Medicare Other | Admitting: Family Medicine

## 2012-09-11 ENCOUNTER — Encounter: Payer: Self-pay | Admitting: Family Medicine

## 2012-09-11 VITALS — BP 150/61 | HR 52 | Temp 98.3°F | Ht 65.5 in | Wt 230.0 lb

## 2012-09-11 DIAGNOSIS — F319 Bipolar disorder, unspecified: Secondary | ICD-10-CM

## 2012-09-11 DIAGNOSIS — E669 Obesity, unspecified: Secondary | ICD-10-CM | POA: Diagnosis not present

## 2012-09-11 DIAGNOSIS — F988 Other specified behavioral and emotional disorders with onset usually occurring in childhood and adolescence: Secondary | ICD-10-CM | POA: Diagnosis not present

## 2012-09-11 DIAGNOSIS — I10 Essential (primary) hypertension: Secondary | ICD-10-CM | POA: Diagnosis not present

## 2012-09-11 LAB — BASIC METABOLIC PANEL
CO2: 23 mEq/L (ref 19–32)
Chloride: 105 mEq/L (ref 96–112)
Sodium: 139 mEq/L (ref 135–145)

## 2012-09-11 MED ORDER — AMPHETAMINE-DEXTROAMPHETAMINE 10 MG PO TABS: 10.0000 mg | ORAL_TABLET | Freq: Every day | ORAL | Status: DC

## 2012-09-11 NOTE — Patient Instructions (Signed)
The nurse will tell you about the group weight management classes Stay on your same medications for now.  Keep a record of your blood pressures and I may decrease your blood pressure meds next time.   See me in three months.

## 2012-09-12 DIAGNOSIS — E669 Obesity, unspecified: Secondary | ICD-10-CM | POA: Insufficient documentation

## 2012-09-12 NOTE — Assessment & Plan Note (Signed)
Acceptable BP given goal

## 2012-09-12 NOTE — Assessment & Plan Note (Signed)
Good control

## 2012-09-12 NOTE — Progress Notes (Signed)
  Subjective:    Patient ID: [name], female    DOB: May 15, 1931, 79 y.o.   MRN: [id number]  HPI  [name] is doing well.  Her depression is under good control without any signs of mania.  BP is OK and we discussed goal BP at her age = 150/90 Needs renewal of adderall, which she continues to feel like significant help. Disappointed with weight.  She is interested in group wt loss classes.    Review of Systems     Objective:   Physical Exam Lungs clear Cardiac, RRR without m or g        Assessment & Plan:

## 2012-09-12 NOTE — Assessment & Plan Note (Signed)
Good control on current meds. 

## 2012-09-12 NOTE — Assessment & Plan Note (Signed)
Refer for group wt loss classes

## 2012-10-15 ENCOUNTER — Other Ambulatory Visit: Payer: Self-pay | Admitting: Family Medicine

## 2012-11-11 DIAGNOSIS — M47817 Spondylosis without myelopathy or radiculopathy, lumbosacral region: Secondary | ICD-10-CM | POA: Diagnosis not present

## 2012-11-19 ENCOUNTER — Emergency Department (HOSPITAL_COMMUNITY): Payer: No Typology Code available for payment source

## 2012-11-19 ENCOUNTER — Inpatient Hospital Stay (HOSPITAL_COMMUNITY)
Admission: EM | Admit: 2012-11-19 | Discharge: 2012-11-27 | DRG: 100 | Disposition: A | Discharge status: Rehabilitation Facility | Payer: No Typology Code available for payment source | Attending: General Surgery | Admitting: General Surgery

## 2012-11-19 ENCOUNTER — Inpatient Hospital Stay (HOSPITAL_COMMUNITY): Payer: No Typology Code available for payment source

## 2012-11-19 ENCOUNTER — Encounter (HOSPITAL_COMMUNITY): Payer: Self-pay | Admitting: Radiology

## 2012-11-19 DIAGNOSIS — H518 Other specified disorders of binocular movement: Secondary | ICD-10-CM | POA: Diagnosis present

## 2012-11-19 DIAGNOSIS — G40909 Epilepsy, unspecified, not intractable, without status epilepticus: Secondary | ICD-10-CM | POA: Diagnosis present

## 2012-11-19 DIAGNOSIS — F319 Bipolar disorder, unspecified: Secondary | ICD-10-CM | POA: Diagnosis present

## 2012-11-19 DIAGNOSIS — S060X0A Concussion without loss of consciousness, initial encounter: Secondary | ICD-10-CM

## 2012-11-19 DIAGNOSIS — S3981XA Other specified injuries of abdomen, initial encounter: Secondary | ICD-10-CM | POA: Diagnosis not present

## 2012-11-19 DIAGNOSIS — J9589 Other postprocedural complications and disorders of respiratory system, not elsewhere classified: Secondary | ICD-10-CM | POA: Diagnosis present

## 2012-11-19 DIAGNOSIS — I498 Other specified cardiac arrhythmias: Secondary | ICD-10-CM | POA: Diagnosis not present

## 2012-11-19 DIAGNOSIS — B964 Proteus (mirabilis) (morganii) as the cause of diseases classified elsewhere: Secondary | ICD-10-CM | POA: Diagnosis present

## 2012-11-19 DIAGNOSIS — J95821 Acute postprocedural respiratory failure: Secondary | ICD-10-CM | POA: Diagnosis present

## 2012-11-19 DIAGNOSIS — R51 Headache: Secondary | ICD-10-CM | POA: Diagnosis not present

## 2012-11-19 DIAGNOSIS — F329 Major depressive disorder, single episode, unspecified: Secondary | ICD-10-CM

## 2012-11-19 DIAGNOSIS — F3130 Bipolar disorder, current episode depressed, mild or moderate severity, unspecified: Secondary | ICD-10-CM | POA: Diagnosis present

## 2012-11-19 DIAGNOSIS — E876 Hypokalemia: Secondary | ICD-10-CM | POA: Diagnosis present

## 2012-11-19 DIAGNOSIS — F489 Nonpsychotic mental disorder, unspecified: Secondary | ICD-10-CM | POA: Diagnosis present

## 2012-11-19 DIAGNOSIS — M6281 Muscle weakness (generalized): Secondary | ICD-10-CM | POA: Diagnosis not present

## 2012-11-19 DIAGNOSIS — S298XXA Other specified injuries of thorax, initial encounter: Secondary | ICD-10-CM | POA: Diagnosis not present

## 2012-11-19 DIAGNOSIS — N390 Urinary tract infection, site not specified: Secondary | ICD-10-CM | POA: Diagnosis present

## 2012-11-19 DIAGNOSIS — I10 Essential (primary) hypertension: Secondary | ICD-10-CM | POA: Diagnosis present

## 2012-11-19 DIAGNOSIS — G40401 Other generalized epilepsy and epileptic syndromes, not intractable, with status epilepticus: Secondary | ICD-10-CM | POA: Diagnosis not present

## 2012-11-19 DIAGNOSIS — G609 Hereditary and idiopathic neuropathy, unspecified: Secondary | ICD-10-CM | POA: Diagnosis present

## 2012-11-19 DIAGNOSIS — R29898 Other symptoms and signs involving the musculoskeletal system: Secondary | ICD-10-CM | POA: Diagnosis present

## 2012-11-19 DIAGNOSIS — W19XXXA Unspecified fall, initial encounter: Secondary | ICD-10-CM | POA: Diagnosis not present

## 2012-11-19 DIAGNOSIS — S0990XA Unspecified injury of head, initial encounter: Secondary | ICD-10-CM | POA: Diagnosis not present

## 2012-11-19 DIAGNOSIS — IMO0002 Reserved for concepts with insufficient information to code with codable children: Secondary | ICD-10-CM

## 2012-11-19 DIAGNOSIS — R569 Unspecified convulsions: Secondary | ICD-10-CM

## 2012-11-19 DIAGNOSIS — S0993XA Unspecified injury of face, initial encounter: Secondary | ICD-10-CM | POA: Diagnosis not present

## 2012-11-19 DIAGNOSIS — D7289 Other specified disorders of white blood cells: Secondary | ICD-10-CM | POA: Diagnosis not present

## 2012-11-19 DIAGNOSIS — E669 Obesity, unspecified: Secondary | ICD-10-CM | POA: Diagnosis present

## 2012-11-19 DIAGNOSIS — G40901 Epilepsy, unspecified, not intractable, with status epilepticus: Secondary | ICD-10-CM

## 2012-11-19 DIAGNOSIS — T148XXA Other injury of unspecified body region, initial encounter: Secondary | ICD-10-CM | POA: Diagnosis not present

## 2012-11-19 DIAGNOSIS — R0902 Hypoxemia: Secondary | ICD-10-CM | POA: Diagnosis present

## 2012-11-19 HISTORY — DX: Low back pain: M54.5

## 2012-11-19 HISTORY — DX: Unspecified osteoarthritis, unspecified site: M19.90

## 2012-11-19 HISTORY — DX: Unspecified injury of right wrist, hand and finger(s), initial encounter: S69.91XA

## 2012-11-19 HISTORY — DX: Other specific arthropathies, not elsewhere classified, unspecified shoulder: M12.819

## 2012-11-19 HISTORY — DX: Low back pain, unspecified: M54.50

## 2012-11-19 LAB — URINALYSIS, ROUTINE W REFLEX MICROSCOPIC
Bilirubin Urine: NEGATIVE
Glucose, UA: NEGATIVE mg/dL
Ketones, ur: NEGATIVE mg/dL
Protein, ur: 100 mg/dL — AB

## 2012-11-19 LAB — COMPREHENSIVE METABOLIC PANEL
AST: 46 U/L — ABNORMAL HIGH (ref 0–37)
Albumin: 4.2 g/dL (ref 3.5–5.2)
BUN: 23 mg/dL (ref 6–23)
Creatinine, Ser: 0.84 mg/dL (ref 0.50–1.10)
Potassium: 5 mEq/L (ref 3.5–5.1)
Total Protein: 8.1 g/dL (ref 6.0–8.3)

## 2012-11-19 LAB — POCT I-STAT TROPONIN I

## 2012-11-19 LAB — DIFFERENTIAL
Basophils Relative: 0 % (ref 0–1)
Eosinophils Relative: 2 % (ref 0–5)
Lymphocytes Relative: 46 % (ref 12–46)
Neutrophils Relative %: 40 % — ABNORMAL LOW (ref 43–77)

## 2012-11-19 LAB — PROTIME-INR: INR: 0.93 (ref 0.00–1.49)

## 2012-11-19 LAB — BLOOD GAS, ARTERIAL
Drawn by: 222511
PEEP: 5 cmH2O
RATE: 16 resp/min
pCO2 arterial: 44.3 mmHg (ref 35.0–45.0)
pH, Arterial: 7.331 — ABNORMAL LOW (ref 7.350–7.450)
pO2, Arterial: 277 mmHg — ABNORMAL HIGH (ref 80.0–100.0)

## 2012-11-19 LAB — URINE MICROSCOPIC-ADD ON

## 2012-11-19 LAB — POCT I-STAT, CHEM 8
BUN: 35 mg/dL — ABNORMAL HIGH (ref 6–23)
Calcium, Ion: 1.15 mmol/L (ref 1.13–1.30)
Creatinine, Ser: 0.8 mg/dL (ref 0.50–1.10)
Glucose, Bld: 180 mg/dL — ABNORMAL HIGH (ref 70–99)
TCO2: 22 mmol/L (ref 0–100)

## 2012-11-19 LAB — CBC
Hemoglobin: 14.7 g/dL (ref 12.0–15.0)
RBC: 4.83 MIL/uL (ref 3.87–5.11)
WBC: 15.3 10*3/uL — ABNORMAL HIGH (ref 4.0–10.5)

## 2012-11-19 LAB — RAPID URINE DRUG SCREEN, HOSP PERFORMED
Amphetamines: POSITIVE — AB
Benzodiazepines: NOT DETECTED
Opiates: NOT DETECTED

## 2012-11-19 LAB — TROPONIN I: Troponin I: <0.3 ng/mL (ref <0.30)

## 2012-11-19 LAB — APTT: aPTT: 28 seconds (ref 24–37)

## 2012-11-19 LAB — MRSA PCR SCREENING: MRSA by PCR: NEGATIVE

## 2012-11-19 MED ORDER — ETOMIDATE 2 MG/ML IV SOLN
INTRAVENOUS | Status: AC | PRN
  Administered 2012-11-19: 30 mg via INTRAVENOUS

## 2012-11-19 MED ORDER — BIOTENE DRY MOUTH MT LIQD
15.0000 mL | Freq: Four times a day (QID) | OROMUCOSAL | Status: DC
  Administered 2012-11-20 – 2012-11-27 (×27): 15 mL via OROMUCOSAL

## 2012-11-19 MED ORDER — SUCCINYLCHOLINE CHLORIDE 20 MG/ML IJ SOLN
INTRAMUSCULAR | Status: AC
  Filled 2012-11-19: qty 1

## 2012-11-19 MED ORDER — FENTANYL BOLUS VIA INFUSION
25.0000 ug | Freq: Four times a day (QID) | INTRAVENOUS | Status: DC | PRN
  Filled 2012-11-19: qty 100

## 2012-11-19 MED ORDER — ROCURONIUM BROMIDE 50 MG/5ML IV SOLN
INTRAVENOUS | Status: AC | PRN
  Administered 2012-11-19: 90 mg via INTRAVENOUS

## 2012-11-19 MED ORDER — PROPOFOL 10 MG/ML IV EMUL
INTRAVENOUS | Status: AC
  Filled 2012-11-19: qty 100

## 2012-11-19 MED ORDER — IOHEXOL 300 MG/ML  SOLN
100.0000 mL | Freq: Once | INTRAMUSCULAR | Status: AC | PRN
  Administered 2012-11-19: 100 mL via INTRAVENOUS

## 2012-11-19 MED ORDER — CHLORHEXIDINE GLUCONATE 0.12 % MT SOLN: 15.0000 mL | Freq: Two times a day (BID) | OROMUCOSAL | Status: DC

## 2012-11-19 MED ORDER — ROCURONIUM BROMIDE 50 MG/5ML IV SOLN
INTRAVENOUS | Status: AC
  Filled 2012-11-19: qty 2

## 2012-11-19 MED ORDER — KCL IN DEXTROSE-NACL 20-5-0.45 MEQ/L-%-% IV SOLN
INTRAVENOUS | Status: DC
  Administered 2012-11-19 – 2012-11-24 (×9): via INTRAVENOUS
  Filled 2012-11-19 (×16): qty 1000

## 2012-11-19 MED ORDER — VALPROATE SODIUM 500 MG/5ML IV SOLN
500.0000 mg | Freq: Once | INTRAVENOUS | Status: AC
  Administered 2012-11-19: 500 mg via INTRAVENOUS
  Filled 2012-11-19 (×2): qty 5

## 2012-11-19 MED ORDER — CHLORHEXIDINE GLUCONATE 0.12 % MT SOLN
OROMUCOSAL | Status: AC
  Filled 2012-11-19: qty 15

## 2012-11-19 MED ORDER — SODIUM CHLORIDE 0.9 % IV SOLN
25.0000 ug/h | INTRAVENOUS | Status: DC
  Administered 2012-11-19: 50 ug/h via INTRAVENOUS
  Filled 2012-11-19: qty 50

## 2012-11-19 MED ORDER — LORAZEPAM 2 MG/ML IJ SOLN
INTRAMUSCULAR | Status: AC
  Filled 2012-11-19: qty 1

## 2012-11-19 MED ORDER — LIDOCAINE HCL (CARDIAC) 20 MG/ML IV SOLN
INTRAVENOUS | Status: AC
  Filled 2012-11-19: qty 5

## 2012-11-19 MED ORDER — CHLORHEXIDINE GLUCONATE 0.12 % MT SOLN
15.0000 mL | Freq: Two times a day (BID) | OROMUCOSAL | Status: DC
  Administered 2012-11-19 – 2012-11-27 (×11): 15 mL via OROMUCOSAL
  Filled 2012-11-19 (×14): qty 15

## 2012-11-19 MED ORDER — PROPOFOL 10 MG/ML IV EMUL
5.0000 ug/kg/min | Freq: Once | INTRAVENOUS | Status: AC
  Administered 2012-11-19: 10 ug/kg/min via INTRAVENOUS

## 2012-11-19 MED ORDER — PROPOFOL 10 MG/ML IV EMUL
5.0000 ug/kg/min | INTRAVENOUS | Status: DC
  Administered 2012-11-19: 15 ug/kg/min via INTRAVENOUS
  Administered 2012-11-19: 20 ug/kg/min via INTRAVENOUS
  Administered 2012-11-19: 15 ug/kg/min via INTRAVENOUS
  Filled 2012-11-19: qty 100

## 2012-11-19 MED ORDER — VALPROATE SODIUM 500 MG/5ML IV SOLN
1000.0000 mg | Freq: Once | INTRAVENOUS | Status: AC
  Administered 2012-11-19: 1000 mg via INTRAVENOUS
  Filled 2012-11-19: qty 10

## 2012-11-19 MED ORDER — ETOMIDATE 2 MG/ML IV SOLN
INTRAVENOUS | Status: AC
  Filled 2012-11-19: qty 20

## 2012-11-19 MED ORDER — VALPROATE SODIUM 500 MG/5ML IV SOLN
500.0000 mg | Freq: Three times a day (TID) | INTRAVENOUS | Status: DC
  Administered 2012-11-20 – 2012-11-22 (×7): 500 mg via INTRAVENOUS
  Filled 2012-11-19 (×9): qty 5

## 2012-11-19 MED ORDER — ENOXAPARIN SODIUM 40 MG/0.4ML ~~LOC~~ SOLN
40.0000 mg | SUBCUTANEOUS | Status: DC
  Administered 2012-11-20 – 2012-11-26 (×7): 40 mg via SUBCUTANEOUS
  Filled 2012-11-19 (×9): qty 0.4
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
## 2012-11-19 NOTE — ED Provider Notes (Signed)
History     CSN: [id number]  Arrival date & time 11/19/12  1416   First MD Initiated Contact with Patient 11/19/12 1419      Chief Complaint  Patient presents with  . Trauma    (Consider location/radiation/quality/duration/timing/severity/associated sxs/prior treatment) HPI  Past Medical History  Diagnosis Date  . Depression   . Ovarian cyst   . Obesity   . HTN (hypertension)   . HLD (hyperlipidemia)   . Seizures 1980s    2/2 meningioma. none since cranioplasty  . Bipolar disorder   . Obesity   . Hiatal hernia     s/p repair  . GI bleed   . AVM (arteriovenous malformation)     Past Surgical History  Procedure Laterality Date  . Hiatal hernia repair    . Breast reduction surgery    . Cranioplasty      2/2 meningioma 1980s  . Knee arthroplasty      bilat    Family History  Problem Relation Age of Onset  . Alzheimer's disease Mother   . Heart disease Father     MI 20yo  . Heart disease Brother     History  Substance Use Topics  . Smoking status: Never Smoker   . Smokeless tobacco: Never Used  . Alcohol Use: No    OB History   Grav Para Term Preterm Abortions TAB SAB Ect Mult Living                  Review of Systems  Allergies  Amoxicillin; Penicillins; Morphine sulfate; Phenobarbital; and Phenytoin  Home Medications   Current Outpatient Rx  Name  Route  Sig  Dispense  Refill  . acetaminophen (TYLENOL) 325 MG tablet   Oral   Take 2 tablets (650 mg total) by mouth every 6 (six) hours as needed (or Fever >/= 101).         Marland Kitchen acyclovir (ZOVIRAX) 5 % ointment   Topical   Apply 1 application topically as needed. For herpes         . albuterol (VENTOLIN HFA) 108 (90 BASE) MCG/ACT inhaler   Inhalation   Inhale 1 puff into the lungs every 6 (six) hours as needed. q4-6 prn          . amphetamine-dextroamphetamine (ADDERALL) 10 MG tablet   Oral   Take 1 tablet (10 mg total) by mouth daily.   90 tablet   0   . atorvastatin (LIPITOR)  10 MG tablet      TAKE 1 TABLET BY MOUTH AT BEDTIME   90 tablet   0   . FLUoxetine (PROZAC) 20 MG capsule      TAKE 3 CAPSULES BY MOUTH DAILY   270 capsule   0   . losartan-hydrochlorothiazide (HYZAAR) 100-25 MG per tablet      TAKE 1 TABLET BY MOUTH DAILY   90 tablet   3     **Patient requests 90 day supply**   . metoprolol succinate (TOPROL-XL) 25 MG 24 hr tablet      TAKE 1 TABLET BY MOUTH AT BEDTIME   90 tablet   0   . triamcinolone cream (KENALOG) 0.1 %                 BP 129/77  Pulse 91  Resp 16  Ht 5\' 6"  (1.676 m)  Wt 229 lb 15 oz (104.3 kg)  BMI 37.13 kg/m2  SpO2 100%  Physical Exam  ED Course  INTUBATION Date/Time:  11/19/2012 4:21 PM Performed by: Oleh Genin Authorized by: Billee Cashing Consent: The procedure was performed in an emergent situation. Required items: required blood products, implants, devices, and special equipment available Patient identity confirmed: arm band Time out: Immediately prior to procedure a "time out" was called to verify the correct patient, procedure, equipment, support staff and site/side marked as required. Indications: airway protection Intubation method: video-assisted Patient status: paralyzed (RSI) Preoxygenation: BVM Sedatives: etomidate (30 mg) Paralytic: rocuronium (90 mg) Laryngoscope size: Mac 4 Tube size: 7.5 mm Tube type: cuffed Number of attempts: 1 Cords visualized: yes Post-procedure assessment: ETCO2 monitor,  chest rise and CO2 detector Breath sounds: equal Cuff inflated: yes ETT to lip: 24 cm Tube secured with: ETT holder Chest x-ray interpreted by me. Chest x-ray findings: endotracheal tube in appropriate position Patient tolerance: Patient tolerated the procedure well with no immediate complications.   (including critical care time)  Labs Reviewed  CBC - Abnormal; Notable for the following:    WBC 15.3 (*)    All other components within normal limits  DIFFERENTIAL -  Abnormal; Notable for the following:    Neutrophils Relative 40 (*)    Lymphs Abs 7.1 (*)    Monocytes Absolute 1.8 (*)    All other components within normal limits  COMPREHENSIVE METABOLIC PANEL - Abnormal; Notable for the following:    Glucose, Bld 172 (*)    AST 46 (*)    GFR calc non Af Amer 64 (*)    GFR calc Af Amer 75 (*)    All other components within normal limits  POCT I-STAT, CHEM 8 - Abnormal; Notable for the following:    Potassium 5.2 (*)    BUN 35 (*)    Glucose, Bld 180 (*)    Hemoglobin 15.6 (*)    All other components within normal limits  CG4 I-STAT (LACTIC ACID) - Abnormal; Notable for the following:    Lactic Acid, Venous 3.05 (*)    All other components within normal limits  ETHANOL  PROTIME-INR  APTT  TROPONIN I  URINE RAPID DRUG SCREEN (HOSP PERFORMED)  URINALYSIS, ROUTINE W REFLEX MICROSCOPIC  POCT I-STAT TROPONIN I   Ct Head Wo Contrast  11/19/2012  *RADIOLOGY REPORT*  Clinical Data:  Trauma, status epilepticus, prior remote right craniotomy, unknown etiology  CT HEAD WITHOUT CONTRAST CT CERVICAL SPINE WITHOUT CONTRAST  Technique:  Multidetector CT imaging of the head and cervical spine was performed following the standard protocol without intravenous contrast.  Multiplanar CT image reconstructions of the cervical spine were also generated.  Comparison:  Head CT 08/29/2008.  No prior CT cervical spine comparison.  CT HEAD  Findings: Evidence of right parietal occipital craniotomy. Curvilinear hyperdensity along the surface of that right occipital gyrus is less likely dystrophic.  No midline shift. No acute hemorrhage, acute infarction, or mass lesion is seen.  Right parietal occipital encephalomalacia noted.  Mild cortical volume loss noted with proportional ventricular prominence.  IMPRESSION: Post right parietal occipital craniotomy changes with encephalomalacia.  No acute intracranial finding.  CT CERVICAL SPINE  Findings: The examination is extremely  suboptimal due to patient body habitus, positioning, and motion.  Allowing for this, there is loss of normal cervical lordosis at C4-C5 and mild to moderate multilevel mid to inferior cervical spine disc degenerative change. No displaced fracture is identified.  Streak artifact from the patient's earrings is present.  No gross soft tissue abnormality. Lung apices demonstrate probable emphysematous change but no pneumothorax or acute abnormality  otherwise.  IMPRESSION: Extremely suboptimal exam without evidence for acute osseous abnormality.  Findings discussed with Dr. Thad Ranger by Dr. Chilton Si on 11/19/2012 at to 3:10 p.m.   Original Report Authenticated By: Christiana Pellant, M.D.    Ct Cervical Spine Wo Contrast  11/19/2012  *RADIOLOGY REPORT*  Clinical Data:  Trauma, status epilepticus, prior remote right craniotomy, unknown etiology  CT HEAD WITHOUT CONTRAST CT CERVICAL SPINE WITHOUT CONTRAST  Technique:  Multidetector CT imaging of the head and cervical spine was performed following the standard protocol without intravenous contrast.  Multiplanar CT image reconstructions of the cervical spine were also generated.  Comparison:  Head CT 08/29/2008.  No prior CT cervical spine comparison.  CT HEAD  Findings: Evidence of right parietal occipital craniotomy. Curvilinear hyperdensity along the surface of that right occipital gyrus is less likely dystrophic.  No midline shift. No acute hemorrhage, acute infarction, or mass lesion is seen.  Right parietal occipital encephalomalacia noted.  Mild cortical volume loss noted with proportional ventricular prominence.  IMPRESSION: Post right parietal occipital craniotomy changes with encephalomalacia.  No acute intracranial finding.  CT CERVICAL SPINE  Findings: The examination is extremely suboptimal due to patient body habitus, positioning, and motion.  Allowing for this, there is loss of normal cervical lordosis at C4-C5 and mild to moderate multilevel mid to inferior  cervical spine disc degenerative change. No displaced fracture is identified.  Streak artifact from the patient's earrings is present.  No gross soft tissue abnormality. Lung apices demonstrate probable emphysematous change but no pneumothorax or acute abnormality otherwise.  IMPRESSION: Extremely suboptimal exam without evidence for acute osseous abnormality.  Findings discussed with Dr. Thad Ranger by Dr. Chilton Si on 11/19/2012 at to 3:10 p.m.   Original Report Authenticated By: Christiana Pellant, M.D.    Dg Pelvis Portable  11/19/2012  *RADIOLOGY REPORT*  Clinical Data: Trauma.  PORTABLE PELVIS  Comparison: 03/24/2012  Findings: Portable view of the pelvis was obtained.  The pelvic bony ring is intact.  No gross abnormality to the hips. Nonspecific bowel gas pattern.  IMPRESSION: No acute bony abnormality.   Original Report Authenticated By: Richarda Overlie, M.D.    Dg Chest Portable 1 View  11/19/2012  *RADIOLOGY REPORT*  Clinical Data: Trauma.  PORTABLE CHEST - 1 VIEW  Comparison: 03/04/2009  Findings: Endotracheal tube is near the right mainstem bronchus. There are low lung volumes.  Limited evaluation of the upper mediastinum.  The thoracic aorta is heavily calcified.  No evidence for a large pneumothorax. Heart size is accentuated by the low lung volumes.  IMPRESSION: Endotracheal tube is near the carina and right mainstem bronchus.  Low lung volumes.  No evidence for a large pneumothorax.  These results were called by telephone on 11/19/2012 at 3:50 p.m. to Dr. Rubin Payor, who verbally acknowledged these results.   Original Report Authenticated By: Richarda Overlie, M.D.      1. Fall, initial encounter   2. Status epilepticus       MDM   Intubation as above.  In line C spine stabilization maintained. Refer to the note of Dr. Rubin Payor for further details of visit.  Oleh Genin, MD PGY-II [facility] Emergency Medicine Resident        Oleh Genin, MD 11/20/12 [phone]
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
## 2012-11-19 NOTE — ED Notes (Signed)
Unable to perform Stroke Swallow screen due to intubated pt.

## 2012-11-19 NOTE — ED Notes (Signed)
PT transported to CT with RN and RT accompanyment.

## 2012-11-19 NOTE — Progress Notes (Signed)
ETT retracted 2cm per MD order. ETT was found at 25 cm so it was pulled back to 23 cm @ the lip. No complications noted. RT will monitor.

## 2012-11-19 NOTE — ED Notes (Signed)
2 mg ativan given by [facility] for seizures

## 2012-11-19 NOTE — ED Notes (Signed)
EDP at bedside with trauma. Preparing for intubation, patient with snoring respirations.
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
## 2012-11-19 NOTE — ED Notes (Signed)
Dr. Rubin Payor noted Lactic Acid at bedside

## 2012-11-20 ENCOUNTER — Inpatient Hospital Stay (HOSPITAL_COMMUNITY): Payer: No Typology Code available for payment source

## 2012-11-20 ENCOUNTER — Encounter (HOSPITAL_COMMUNITY): Payer: Self-pay | Admitting: *Deleted

## 2012-11-20 DIAGNOSIS — W19XXXA Unspecified fall, initial encounter: Secondary | ICD-10-CM | POA: Diagnosis not present

## 2012-11-20 DIAGNOSIS — M6281 Muscle weakness (generalized): Secondary | ICD-10-CM | POA: Diagnosis not present

## 2012-11-20 DIAGNOSIS — R569 Unspecified convulsions: Secondary | ICD-10-CM | POA: Diagnosis not present

## 2012-11-20 DIAGNOSIS — J95821 Acute postprocedural respiratory failure: Secondary | ICD-10-CM | POA: Diagnosis not present

## 2012-11-20 DIAGNOSIS — G40401 Other generalized epilepsy and epileptic syndromes, not intractable, with status epilepticus: Secondary | ICD-10-CM | POA: Diagnosis not present

## 2012-11-20 DIAGNOSIS — J811 Chronic pulmonary edema: Secondary | ICD-10-CM | POA: Diagnosis not present

## 2012-11-20 DIAGNOSIS — Z4682 Encounter for fitting and adjustment of non-vascular catheter: Secondary | ICD-10-CM | POA: Diagnosis not present

## 2012-11-20 LAB — BASIC METABOLIC PANEL
BUN: 18 mg/dL (ref 6–23)
GFR calc Af Amer: >90 mL/min (ref >90)
GFR calc non Af Amer: 80 mL/min — ABNORMAL LOW (ref >90)
Potassium: 3.3 mEq/L — ABNORMAL LOW (ref 3.5–5.1)

## 2012-11-20 LAB — CBC
Platelets: 230 10*3/uL (ref 150–400)
RDW: 13.5 % (ref 11.5–15.5)
WBC: 11.5 10*3/uL — ABNORMAL HIGH (ref 4.0–10.5)

## 2012-11-20 LAB — PATHOLOGIST SMEAR REVIEW

## 2012-11-20 MED ORDER — DEXMEDETOMIDINE HCL IN NACL 200 MCG/50ML IV SOLN
0.2000 ug/kg/h | INTRAVENOUS | Status: DC
  Administered 2012-11-20: 0.2 ug/kg/h via INTRAVENOUS
  Filled 2012-11-20: qty 50

## 2012-11-20 MED ORDER — FENTANYL CITRATE 0.05 MG/ML IJ SOLN
25.0000 ug | INTRAMUSCULAR | Status: DC | PRN
  Administered 2012-11-20 – 2012-11-21 (×4): 25 ug via INTRAVENOUS
  Filled 2012-11-20 (×4): qty 2

## 2012-11-20 MED ORDER — MIDAZOLAM HCL 2 MG/2ML IJ SOLN: 2.0000 mg | INTRAMUSCULAR | Status: DC | PRN

## 2012-11-20 NOTE — Progress Notes (Signed)
Pt awoke and indicated to nursing staff that she is suicidal and has been suicidal in the past. She instructed the only one that she wanted to speak with the Dr. Leveda Anna of [facility], and that he would be the only one she would speak with. Dr. Lindie Spruce was notified of the situation and suicide precautions were initiated. Dr. Leveda Anna was contacted and visited with the patient. SCDs were removed per patient request and room was cooled off. Pt appears to be resting now. Will continue to monitor.  Marcos Eke, RN
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
## 2012-11-20 NOTE — Progress Notes (Signed)
Portable EEG completed

## 2012-11-20 NOTE — Procedures (Signed)
Extubation Procedure Note  Patient Details:   Name: [name] DOB: 25-Feb-1931 MRN: [id number]   Airway Documentation:     Evaluation  O2 sats: stable throughout Complications: No apparent complications Patient did tolerate procedure well. Bilateral Breath Sounds: Clear;Diminished Suctioning: Airway Yes   Patient extubated and placed on 4LNC. Patient has clear/diminished bilateral breathsounds, no stridor present. HR-70 RR-19 96% Adolm Joseph 11/20/2012, 12:22 PM

## 2012-11-20 NOTE — ED Provider Notes (Signed)
I saw and evaluated the patient, reviewed the resident's note and I agree with the findings and plan. I was present for the entire intubation and assisted as needed. The tube was somewhat far in and was withdrawn 2 cm    Juliet Rude. Rubin Payor, MD 11/20/12 2134

## 2012-11-20 NOTE — Progress Notes (Signed)
UR completed 

## 2012-11-21 DIAGNOSIS — J95821 Acute postprocedural respiratory failure: Secondary | ICD-10-CM | POA: Diagnosis not present

## 2012-11-21 DIAGNOSIS — F319 Bipolar disorder, unspecified: Secondary | ICD-10-CM | POA: Diagnosis not present

## 2012-11-21 DIAGNOSIS — R569 Unspecified convulsions: Secondary | ICD-10-CM | POA: Diagnosis not present

## 2012-11-21 DIAGNOSIS — F39 Unspecified mood [affective] disorder: Secondary | ICD-10-CM

## 2012-11-21 DIAGNOSIS — W19XXXA Unspecified fall, initial encounter: Secondary | ICD-10-CM | POA: Diagnosis not present

## 2012-11-21 DIAGNOSIS — M6281 Muscle weakness (generalized): Secondary | ICD-10-CM | POA: Diagnosis not present

## 2012-11-21 DIAGNOSIS — G40401 Other generalized epilepsy and epileptic syndromes, not intractable, with status epilepticus: Secondary | ICD-10-CM | POA: Diagnosis not present

## 2012-11-21 MED ORDER — FENTANYL CITRATE 0.05 MG/ML IJ SOLN
25.0000 ug | INTRAMUSCULAR | Status: DC | PRN
  Administered 2012-11-21 – 2012-11-22 (×4): 50 ug via INTRAVENOUS
  Administered 2012-11-22: 25 ug via INTRAVENOUS
  Administered 2012-11-22 (×2): 50 ug via INTRAVENOUS
  Filled 2012-11-21 (×6): qty 2
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
## 2012-11-21 NOTE — Procedures (Signed)
REFERRING PHYSICIAN:  Dr. Delford Field.  HISTORY:  A 79 year old female with a history of seizures and an old right craniotomy presenting with new breakthrough seizures.  MEDICATIONS:  Depacon, Lovenox, Versed, Ativan, and Precedex.  CONDITIONS OF RECORDING:  This is a 16-channel EEG carried out with the patient in the awake and drowsy states.  DESCRIPTION:  Over the right hemisphere, the background activity is of increased voltage.  The activity is poorly organized.  There is embedded sharp activity.  There are mixture of frequencies noted and this is consistent with a breach rhythm.  Otherwise, the background activity consists of a mixture of theta and delta rhythms over the left hemisphere.  No stage II sleep is noted.  Hyperventilation and intermittent photic stimulation were not performed.  IMPRESSION:  This is an abnormal EEG secondary to a right breach rhythm that is consistent with the patient's history of an old craniotomy. Background activity is otherwise slow and is consistent with normal drowse, but cannot rule out the possibility of a diffuse cerebral abnormality consistent with a postictal state versus a metabolic encephalopathy, among other possibilities.          ______________________________ Thana Farr, MD    [id number] D:  11/20/2012 19:05:23  T:  11/21/2012 04:54:09  Job #:  [id number]
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
## 2012-11-22 DIAGNOSIS — M6281 Muscle weakness (generalized): Secondary | ICD-10-CM | POA: Diagnosis not present

## 2012-11-22 DIAGNOSIS — R569 Unspecified convulsions: Secondary | ICD-10-CM | POA: Diagnosis not present

## 2012-11-22 DIAGNOSIS — G40401 Other generalized epilepsy and epileptic syndromes, not intractable, with status epilepticus: Secondary | ICD-10-CM | POA: Diagnosis not present

## 2012-11-22 DIAGNOSIS — F39 Unspecified mood [affective] disorder: Secondary | ICD-10-CM | POA: Diagnosis not present

## 2012-11-22 LAB — URINALYSIS, ROUTINE W REFLEX MICROSCOPIC
Bilirubin Urine: NEGATIVE
Glucose, UA: NEGATIVE mg/dL
Ketones, ur: 15 mg/dL — AB
Nitrite: POSITIVE — AB
Specific Gravity, Urine: 1.02 (ref 1.005–1.030)
pH: 8 (ref 5.0–8.0)

## 2012-11-22 LAB — URINE MICROSCOPIC-ADD ON

## 2012-11-22 LAB — VALPROIC ACID LEVEL: Valproic Acid Lvl: 101 ug/mL — ABNORMAL HIGH (ref 50.0–100.0)

## 2012-11-22 MED ORDER — VALPROATE SODIUM 500 MG/5ML IV SOLN
250.0000 mg | Freq: Three times a day (TID) | INTRAVENOUS | Status: DC
  Administered 2012-11-22 – 2012-11-25 (×8): 250 mg via INTRAVENOUS
  Filled 2012-11-22 (×13): qty 2.5

## 2012-11-22 MED ORDER — FLUOXETINE HCL 20 MG PO CAPS
40.0000 mg | ORAL_CAPSULE | Freq: Every day | ORAL | Status: DC
  Administered 2012-11-22 – 2012-11-27 (×6): 40 mg via ORAL
  Filled 2012-11-22 (×6): qty 2

## 2012-11-22 NOTE — Progress Notes (Signed)
  Subjective: Pt with no active issues overnight.  Psych eval'd.  No seizures since admission- Neurology following.  Objective: Vital signs in last 24 hours: Temp:  [97.4 F (36.3 C)-99.5 F (37.5 C)] 98.4 F (36.9 C) (04/13 0800) Pulse Rate:  [61-107] 95 (04/13 0800) Resp:  [14-22] 17 (04/13 0800) BP: (104-154)/(33-91) 143/69 mmHg (04/13 0800) SpO2:  [86 %-100 %] 97 % (04/13 0800)    Intake/Output from previous day: 04/12 0701 - 04/13 0700 In: 2455 [I.V.:2290; IV Piggyback:165] Out: 1470 [Urine:1470] Intake/Output this shift: Total I/O In: 100 [I.V.:100] Out: 100 [Urine:100]  General appearance: alert and cooperative GI: soft, non-tender; bowel sounds normal; no masses,  no organomegaly Psych: con't with SI  Lab Results:   Recent Labs  11/19/12 1421 11/19/12 1450 11/20/12 0612  WBC 15.3*  --  11.5*  HGB 14.7 15.6* 12.1  HCT 42.7 46.0 34.6*  PLT 300  --  230   BMET  Recent Labs  11/19/12 1421 11/19/12 1450 11/20/12 0612  NA 136 138 136  K 5.0 5.2* 3.3*  CL 100 106 101  CO2 22  --  26  GLUCOSE 172* 180* 133*  BUN 23 35* 18  CREATININE 0.84 0.80 0.70  CALCIUM 9.6  --  8.7   PT/INR  Recent Labs  11/19/12 1421  LABPROT 12.4  INR 0.93   ABG  Recent Labs  11/19/12 1809  PHART 7.331*  HCO3 22.7    Studies/Results: No results found.  Anti-infectives: Anti-infectives   None      Assessment/Plan: s/p * No surgery found * Advance diet Neuro monitoring depakote Mobilize OK for transfer when OK with neuro  LOS: 3 days    Marigene Ehlers., [facility] 11/22/2012

## 2012-11-22 NOTE — Progress Notes (Signed)
Subjective: Patient reports that she feels as if she is going to die but has no specific complaints other than hurting all over.  No further seizure activity noted.  On Depakote.  Level 101.0.  Objective: Current vital signs: BP 143/69  Pulse 95  Temp(Src) 98.4 F (36.9 C) (Oral)  Resp 17  Ht 5\' 6"  (1.676 m)  Wt 103.4 kg (227 lb 15.3 oz)  BMI 36.81 kg/m2  SpO2 97% Vital signs in last 24 hours: Temp:  [97.4 F (36.3 C)-99.5 F (37.5 C)] 98.4 F (36.9 C) (04/13 0800) Pulse Rate:  [61-107] 95 (04/13 0800) Resp:  [14-22] 17 (04/13 0800) BP: (104-154)/(33-91) 143/69 mmHg (04/13 0800) SpO2:  [86 %-100 %] 97 % (04/13 0800)  Intake/Output from previous day: 04/12 0701 - 04/13 0700 In: 2355 [I.V.:2190; IV Piggyback:165] Out: 1470 [Urine:1470] Intake/Output this shift: Total I/O In: -  Out: 100 [Urine:100] Nutritional status: Clear Liquid  Neurologic Exam: Mental Status:  Alert.  Speech fluent without evidence of aphasia. Able to follow 3 step commands without difficulty.  Cranial Nerves:  II: Discs flat bilaterally; Visual fields grossly normal, pupils equal, round, reactive to light and accommodation  III,IV, VI: ptosis not present, extra-ocular motions intact bilaterally  V,VII: smile symmetric, facial light touch sensation normal bilaterally  VIII: hearing normal bilaterally  IX,X: gag reflex present  XI: bilateral shoulder shrug  XII: midline tongue extension  Motor:  Lifts all extremities easily and equally off of the bed.  Sensory: Pinprick and light touch intact throughout, bilaterally  Deep Tendon Reflexes: 1+ in the upper extremities and absent in the lower extremities   Lab Results: Basic Metabolic Panel:  Recent Labs Lab 11/19/12 1421 11/19/12 1450 11/20/12 0612  NA 136 138 136  K 5.0 5.2* 3.3*  CL 100 106 101  CO2 22  --  26  GLUCOSE 172* 180* 133*  BUN 23 35* 18  CREATININE 0.84 0.80 0.70  CALCIUM 9.6  --  8.7    Liver Function Tests:  Recent  Labs Lab 11/19/12 1421  AST 46*  ALT 19  ALKPHOS 93  BILITOT 0.5  PROT 8.1  ALBUMIN 4.2   No results found for this basename: LIPASE, AMYLASE,  in the last 168 hours No results found for this basename: AMMONIA,  in the last 168 hours  CBC:  Recent Labs Lab 11/19/12 1421 11/19/12 1450 11/20/12 0612  WBC 15.3*  --  11.5*  NEUTROABS 6.1  --   --   HGB 14.7 15.6* 12.1  HCT 42.7 46.0 34.6*  MCV 88.4  --  86.3  PLT 300  --  230    Cardiac Enzymes:  Recent Labs Lab 11/19/12 1421  TROPONINI <0.30    Lipid Panel:  Recent Labs Lab 11/22/12 0545  TRIG 69    CBG: No results found for this basename: GLUCAP,  in the last 168 hours  Microbiology: Results for orders placed during the hospital encounter of 11/19/12  MRSA PCR SCREENING     Status: None   Collection Time    11/19/12  6:20 PM      Result Value Range Status   MRSA by PCR NEGATIVE  NEGATIVE Final   Comment:            The GeneXpert MRSA Assay (FDA     approved for NASAL specimens     only), is one component of a     comprehensive MRSA colonization     surveillance program. It is not  intended to diagnose MRSA     infection nor to guide or     monitor treatment for     MRSA infections.    Coagulation Studies:  Recent Labs  11/19/12 1421  LABPROT 12.4  INR 0.93    Imaging: No results found.  Medications:  I have reviewed the patient's current medications. Scheduled: . antiseptic oral rinse  15 mL Mouth Rinse QID  . chlorhexidine  15 mL Mouth Rinse BID  . enoxaparin (LOVENOX) injection  40 mg Subcutaneous Q24H  . valproate sodium  500 mg Intravenous Q8H    Assessment/Plan: No further seizures noted.  Depakote level elevated at 101.  No side effects noted to Depakote.  Remains on IV.  Recommendations: 1.  Decrease Depacon to 250mg  IV q8hrs. 2.  Depakote level in AM 3.  Continue seizure precautions   LOS: 3 days   Thana Farr, MD [facility] [phone] 11/22/2012  8:37 AM
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
## 2012-11-23 ENCOUNTER — Inpatient Hospital Stay (HOSPITAL_COMMUNITY): Payer: No Typology Code available for payment source

## 2012-11-23 DIAGNOSIS — R569 Unspecified convulsions: Secondary | ICD-10-CM | POA: Diagnosis not present

## 2012-11-23 DIAGNOSIS — G40401 Other generalized epilepsy and epileptic syndromes, not intractable, with status epilepticus: Secondary | ICD-10-CM | POA: Diagnosis not present

## 2012-11-23 DIAGNOSIS — F329 Major depressive disorder, single episode, unspecified: Secondary | ICD-10-CM

## 2012-11-23 DIAGNOSIS — S6990XA Unspecified injury of unspecified wrist, hand and finger(s), initial encounter: Secondary | ICD-10-CM | POA: Diagnosis not present

## 2012-11-23 DIAGNOSIS — M6281 Muscle weakness (generalized): Secondary | ICD-10-CM | POA: Diagnosis not present

## 2012-11-23 DIAGNOSIS — M79609 Pain in unspecified limb: Secondary | ICD-10-CM | POA: Diagnosis not present

## 2012-11-23 DIAGNOSIS — J95821 Acute postprocedural respiratory failure: Secondary | ICD-10-CM | POA: Diagnosis not present

## 2012-11-23 DIAGNOSIS — M7989 Other specified soft tissue disorders: Secondary | ICD-10-CM | POA: Diagnosis not present

## 2012-11-23 MED ORDER — AMPHETAMINE-DEXTROAMPHETAMINE 10 MG PO TABS: 10.0000 mg | ORAL_TABLET | Freq: Every day | ORAL | Status: DC

## 2012-11-23 MED ORDER — CIPROFLOXACIN HCL 500 MG PO TABS
500.0000 mg | ORAL_TABLET | Freq: Two times a day (BID) | ORAL | Status: DC
  Administered 2012-11-23 – 2012-11-27 (×9): 500 mg via ORAL
  Filled 2012-11-23 (×12): qty 1

## 2012-11-23 MED ORDER — AMPHETAMINE-DEXTROAMPHETAMINE 10 MG PO TABS
10.0000 mg | ORAL_TABLET | Freq: Every day | ORAL | Status: DC
  Administered 2012-11-27: 10 mg via ORAL
  Filled 2012-11-23 (×4): qty 1
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
## 2012-11-23 NOTE — Progress Notes (Signed)
Subjective: Patient awake but continues to complain of pain.  Continues to not move the right.  Patient did report on the day of admission that when she is stressed she has neurological symptoms historically.  No further seizure activity noted.  Depakote level of 77.1.  Objective: Current vital signs: BP 149/66  Pulse 84  Temp(Src) 97.8 F (36.6 C) (Oral)  Resp 21  Ht 5\' 6"  (1.676 m)  Wt 103.4 kg (227 lb 15.3 oz)  BMI 36.81 kg/m2  SpO2 99% Vital signs in last 24 hours: Temp:  [97.6 F (36.4 C)-99.1 F (37.3 C)] 97.8 F (36.6 C) (04/14 0800) Pulse Rate:  [72-96] 84 (04/14 1000) Resp:  [17-37] 21 (04/14 1000) BP: (111-149)/(44-96) 149/66 mmHg (04/14 1000) SpO2:  [96 %-100 %] 99 % (04/14 1000)  Intake/Output from previous day: 04/13 0701 - 04/14 0700 In: 2682.5 [P.O.:120; I.V.:2400; IV Piggyback:162.5] Out: 750 [Urine:750] Intake/Output this shift: Total I/O In: 300 [I.V.:300] Out: -  Nutritional status: Dysphagia  Neurologic Exam: Mental Status:  Alert. Speech fluent without evidence of aphasia. Able to follow 3 step commands without difficulty.  Cranial Nerves:  II: Discs flat bilaterally; Visual fields grossly normal, pupils equal, round, reactive to light and accommodation  III,IV, VI: ptosis not present, extra-ocular motions intact bilaterally  V,VII: smile symmetric, facial light touch sensation normal bilaterally  VIII: hearing normal bilaterally  IX,X: gag reflex present  XI: bilateral shoulder shrug  XII: midline tongue extension  Motor:  Does not move the right arm spontaneously but lets it drop slowly over the face when raised actively.  Moves both lower extremities. Sensory: Pinprick and light touch intact throughout, bilaterally  Deep Tendon Reflexes: 1+ in the upper extremities and absent in the lower extremities   Lab Results: Basic Metabolic Panel:  Recent Labs Lab 11/19/12 1421 11/19/12 1450 11/20/12 0612  NA 136 138 136  K 5.0 5.2* 3.3*  CL  100 106 101  CO2 22  --  26  GLUCOSE 172* 180* 133*  BUN 23 35* 18  CREATININE 0.84 0.80 0.70  CALCIUM 9.6  --  8.7    Liver Function Tests:  Recent Labs Lab 11/19/12 1421  AST 46*  ALT 19  ALKPHOS 93  BILITOT 0.5  PROT 8.1  ALBUMIN 4.2   No results found for this basename: LIPASE, AMYLASE,  in the last 168 hours No results found for this basename: AMMONIA,  in the last 168 hours  CBC:  Recent Labs Lab 11/19/12 1421 11/19/12 1450 11/20/12 0612  WBC 15.3*  --  11.5*  NEUTROABS 6.1  --   --   HGB 14.7 15.6* 12.1  HCT 42.7 46.0 34.6*  MCV 88.4  --  86.3  PLT 300  --  230    Cardiac Enzymes:  Recent Labs Lab 11/19/12 1421  TROPONINI <0.30    Lipid Panel:  Recent Labs Lab 11/22/12 0545  TRIG 69    CBG: No results found for this basename: GLUCAP,  in the last 168 hours  Microbiology: Results for orders placed during the hospital encounter of 11/19/12  MRSA PCR SCREENING     Status: None   Collection Time    11/19/12  6:20 PM      Result Value Range Status   MRSA by PCR NEGATIVE  NEGATIVE Final   Comment:            The GeneXpert MRSA Assay (FDA     approved for NASAL specimens     only),  is one component of a     comprehensive MRSA colonization     surveillance program. It is not     intended to diagnose MRSA     infection nor to guide or     monitor treatment for     MRSA infections.    Coagulation Studies: No results found for this basename: LABPROT, INR,  in the last 72 hours  Imaging: No results found.  Medications:  I have reviewed the patient's current medications. Scheduled: . amphetamine-dextroamphetamine  10 mg Oral Q breakfast  . antiseptic oral rinse  15 mL Mouth Rinse QID  . chlorhexidine  15 mL Mouth Rinse BID  . enoxaparin (LOVENOX) injection  40 mg Subcutaneous Q24H  . FLUoxetine  40 mg Oral Daily  . valproate sodium  250 mg Intravenous Q8H    Assessment/Plan: 79 year old female presenting with seizures.  Has had  no further seizures since admission.  On Depakote with therapeutic level.  Right sided weakness fluctuates.  Initial head CT shows the old right craniotomy.  This is not consistent with the right sided weakness.  Recommendations: 1.   MRI of the brain 2.  Continue Depakote at current dose.   3.  Continue seizure precautions.     LOS: 4 days   Thana Farr, MD [facility] [phone] 11/23/2012  10:53 AM
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
## 2012-11-24 ENCOUNTER — Inpatient Hospital Stay (HOSPITAL_COMMUNITY): Payer: No Typology Code available for payment source

## 2012-11-24 DIAGNOSIS — M5030 Other cervical disc degeneration, unspecified cervical region: Secondary | ICD-10-CM | POA: Diagnosis not present

## 2012-11-24 DIAGNOSIS — R569 Unspecified convulsions: Secondary | ICD-10-CM | POA: Diagnosis not present

## 2012-11-24 DIAGNOSIS — J95821 Acute postprocedural respiratory failure: Secondary | ICD-10-CM | POA: Diagnosis not present

## 2012-11-24 DIAGNOSIS — F339 Major depressive disorder, recurrent, unspecified: Secondary | ICD-10-CM | POA: Diagnosis not present

## 2012-11-24 DIAGNOSIS — G40401 Other generalized epilepsy and epileptic syndromes, not intractable, with status epilepticus: Secondary | ICD-10-CM | POA: Diagnosis not present

## 2012-11-24 DIAGNOSIS — M47812 Spondylosis without myelopathy or radiculopathy, cervical region: Secondary | ICD-10-CM | POA: Diagnosis not present

## 2012-11-24 DIAGNOSIS — M6281 Muscle weakness (generalized): Secondary | ICD-10-CM | POA: Diagnosis not present

## 2012-11-24 LAB — URINE CULTURE

## 2012-11-24 MED ORDER — OXYCODONE HCL 5 MG PO TABS: 5.0000 mg | ORAL_TABLET | ORAL | Status: DC | PRN

## 2012-11-24 MED ORDER — ALBUTEROL SULFATE HFA 108 (90 BASE) MCG/ACT IN AERS
2.0000 | INHALATION_SPRAY | RESPIRATORY_TRACT | Status: DC | PRN
  Filled 2012-11-24: qty 6.7

## 2012-11-24 MED ORDER — METOPROLOL SUCCINATE ER 25 MG PO TB24
25.0000 mg | ORAL_TABLET | Freq: Every day | ORAL | Status: DC
  Administered 2012-11-24 – 2012-11-26 (×3): 25 mg via ORAL
  Filled 2012-11-24 (×4): qty 1

## 2012-11-24 MED ORDER — IBUPROFEN 400 MG PO TABS
400.0000 mg | ORAL_TABLET | Freq: Three times a day (TID) | ORAL | Status: DC | PRN
  Administered 2012-11-25 – 2012-11-26 (×2): 400 mg via ORAL
  Filled 2012-11-24 (×2): qty 1

## 2012-11-24 MED ORDER — ATORVASTATIN CALCIUM 10 MG PO TABS
10.0000 mg | ORAL_TABLET | Freq: Every day | ORAL | Status: DC
Start: 2012-11-24 — End: 2012-11-27
  Administered 2012-11-24 – 2012-11-26 (×3): 10 mg via ORAL
  Filled 2012-11-24 (×4): qty 1

## 2012-11-24 MED ORDER — KCL IN DEXTROSE-NACL 20-5-0.45 MEQ/L-%-% IV SOLN
INTRAVENOUS | Status: DC
  Administered 2012-11-24: 20 mL/h via INTRAVENOUS
  Filled 2012-11-24: qty 1000

## 2012-11-24 NOTE — Progress Notes (Signed)
Pt is complaining of neck pain and is insisting for a cervical collar to be applied. The patient refused pain medication. Notified Charma Igo, [place]. Soft collar may be applied if she needs it. Will continue to monitor.
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
## 2012-11-24 NOTE — Progress Notes (Signed)
UR completed 
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
## 2012-11-24 NOTE — Progress Notes (Signed)
Orthopedic Tech Progress Note Patient Details:  [name] 01/28/1931 [id number]  Ortho Devices Type of Ortho Device: Soft collar Ortho Device/Splint Interventions: Application   Cammer, Mickie Bail 11/24/2012, 1:43 PM
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
## 2012-11-25 ENCOUNTER — Other Ambulatory Visit: Payer: Self-pay | Admitting: Family Medicine

## 2012-11-25 DIAGNOSIS — W19XXXA Unspecified fall, initial encounter: Secondary | ICD-10-CM | POA: Diagnosis not present

## 2012-11-25 DIAGNOSIS — M6281 Muscle weakness (generalized): Secondary | ICD-10-CM | POA: Diagnosis not present

## 2012-11-25 DIAGNOSIS — R569 Unspecified convulsions: Secondary | ICD-10-CM | POA: Diagnosis not present

## 2012-11-25 DIAGNOSIS — S060X0A Concussion without loss of consciousness, initial encounter: Secondary | ICD-10-CM

## 2012-11-25 DIAGNOSIS — G40401 Other generalized epilepsy and epileptic syndromes, not intractable, with status epilepticus: Secondary | ICD-10-CM | POA: Diagnosis not present

## 2012-11-25 DIAGNOSIS — J95821 Acute postprocedural respiratory failure: Secondary | ICD-10-CM | POA: Diagnosis not present

## 2012-11-25 MED ORDER — DIVALPROEX SODIUM 250 MG PO DR TAB
250.0000 mg | DELAYED_RELEASE_TABLET | Freq: Every day | ORAL | Status: DC
  Administered 2012-11-25 – 2012-11-27 (×3): 250 mg via ORAL
  Filled 2012-11-25 (×4): qty 1

## 2012-11-25 MED ORDER — DIVALPROEX SODIUM 500 MG PO DR TAB
500.0000 mg | DELAYED_RELEASE_TABLET | Freq: Every day | ORAL | Status: DC
  Administered 2012-11-25 – 2012-11-26 (×2): 500 mg via ORAL
  Filled 2012-11-25 (×4): qty 1

## 2012-11-25 NOTE — Progress Notes (Signed)
Pt refuses to take adderall this am, noted she did not take yesterday either. Pt wishes not to take so many meds. Pt plans to discuss with MD when they round today.
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
## 2012-11-25 NOTE — Progress Notes (Signed)
Rehab Admissions Coordinator Note:  Patient was screened by Meryl Dare for appropriateness for an Inpatient Acute Rehab Consult.  At this time, we are recommending Inpatient Rehab consult.  Meryl Dare 11/25/2012, 3:17 PM  I can be reached at [phone].
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
## 2012-11-25 NOTE — Evaluation (Signed)
Physical Therapy Evaluation Patient Details Name: [name] MRN: [id number] DOB: 1930/09/02 Today's Date: 11/25/2012 Time: 4098-1191 PT Time Calculation (min): 37 min  PT Assessment / Plan / Recommendation Clinical Impression  pt admitted after traumatic fall with seizure on admission.  Pt remains lethargic on eval as well as weak with little tolerance for activity and difficulty mobilizing.  Pt can benefit from PT to maximize Independence.    PT Assessment  Patient needs continued PT services    Follow Up Recommendations  CIR    Does the patient have the potential to tolerate intense rehabilitation      Barriers to Discharge Decreased caregiver support      Equipment Recommendations  Other (comment) (TBD post acute)    Recommendations for Other Services Rehab consult   Frequency Min 3X/week    Precautions / Restrictions Precautions Precautions: Fall;Cervical Required Braces or Orthoses: Cervical Brace Cervical Brace: Soft collar   Pertinent Vitals/Pain       Mobility  Bed Mobility Bed Mobility: Supine to Sit;Sitting - Scoot to [place] of Bed;Sit to Supine Rolling Left: 3: Mod assist Left Sidelying to Sit: 3: Mod assist Sitting - Scoot to Edge of Bed: 4: Min assist Details for Bed Mobility Assistance: vc's for technique, hand placement; truncal assist varied Transfers Transfers: Sit to Stand;Stand to Sit;Stand Pivot Transfers Sit to Stand: 2: Max assist;With upper extremity assist;From bed Stand to Sit: 3: Mod assist;With upper extremity assist;To chair/3-in-1 Stand Pivot Transfers: 2: Max assist Details for Transfer Assistance: vc's for hand placement, technique; assist to come forward, help with w/shift and stability for pivot with RW Ambulation/Gait Ambulation/Gait Assistance: Not tested (comment) Stairs: No Wheelchair Mobility Wheelchair Mobility: No    Exercises     PT Diagnosis: Difficulty walking;Generalized weakness;Acute pain  PT Problem List:  Decreased strength;Decreased activity tolerance;Decreased balance;Decreased mobility;Decreased knowledge of use of DME;Decreased knowledge of precautions;Pain PT Treatment Interventions: DME instruction;Gait training;Stair training;Functional mobility training;Therapeutic activities;Balance training;Patient/family education   PT Goals Acute Rehab PT Goals PT Goal Formulation: With patient Time For Goal Achievement: 12/09/12 Potential to Achieve Goals: Good Pt will go Supine/Side to Sit: with supervision;with HOB 0 degrees PT Goal: Supine/Side to Sit - Progress: Goal set today Pt will go Sit to Stand: with min assist PT Goal: Sit to Stand - Progress: Goal set today Pt will Transfer Bed to Chair/Chair to Bed: with min assist PT Transfer Goal: Bed to Chair/Chair to Bed - Progress: Goal set today Pt will Ambulate: 16 - 50 feet;with min assist;with least restrictive assistive device PT Goal: Ambulate - Progress: Goal set today  Visit Information  Last PT Received On: 11/25/12 Assistance Needed: +2    Subjective Data  Subjective: I'm definitely a control freak and going to be in control of what I do. Patient Stated Goal: Back home   Prior Functioning  Home Living Lives With: Alone Available Help at Discharge:  (friends prn) Type of Home: House Home Layout: One level Bathroom Shower/Tub: Health visitor: Standard Home Adaptive Equipment: Garment/textile technologist - rolling Prior Function Level of Independence: Independent Able to Take Stairs?: Yes Driving: Yes Vocation: Retired Musician: No difficulties Dominant Hand: Right    Cognition  Cognition Arousal/Alertness: Awake/alert Overall Cognitive Status: Within Functional Limits for tasks assessed    Extremity/Trunk Assessment Right Lower Extremity Assessment RLE ROM/Strength/Tone: Deficits RLE ROM/Strength/Tone Deficits: grossly weak overall at 4/5  BiL; pf 3+/5 df 3+ to 4-/5 Left Lower Extremity  Assessment LLE ROM/Strength/Tone: Deficits LLE ROM/Strength/Tone Deficits: See R  LE   Balance Balance Balance Assessed: Yes Static Sitting Balance Static Sitting - Balance Support: Left upper extremity supported;Right upper extremity supported Static Sitting - Level of Assistance: Other (comment) (min guard assist) Static Sitting - Comment/# of Minutes: 5 min Static Standing Balance Static Standing - Balance Support: During functional activity;Bilateral upper extremity supported Static Standing - Level of Assistance: 1: +2 Total assist  End of Session PT - End of Session Equipment Utilized During Treatment: Gait belt Activity Tolerance: Patient tolerated treatment well;Patient limited by fatigue Patient left: in chair;with call bell/phone within reach;Other (comment) (sitter in room) Nurse Communication: Mobility status  GP     [name], Eliseo Gum 11/25/2012, 2:40 PM 11/25/2012  [place] Bing, PT [phone] (pager)

## 2012-11-26 DIAGNOSIS — S069X9A Unspecified intracranial injury with loss of consciousness of unspecified duration, initial encounter: Secondary | ICD-10-CM | POA: Diagnosis not present

## 2012-11-26 DIAGNOSIS — W19XXXA Unspecified fall, initial encounter: Secondary | ICD-10-CM | POA: Diagnosis not present

## 2012-11-26 DIAGNOSIS — R569 Unspecified convulsions: Secondary | ICD-10-CM | POA: Diagnosis not present

## 2012-11-26 DIAGNOSIS — R269 Unspecified abnormalities of gait and mobility: Secondary | ICD-10-CM

## 2012-11-26 DIAGNOSIS — M6281 Muscle weakness (generalized): Secondary | ICD-10-CM | POA: Diagnosis not present

## 2012-11-26 DIAGNOSIS — J95821 Acute postprocedural respiratory failure: Secondary | ICD-10-CM | POA: Diagnosis not present

## 2012-11-26 DIAGNOSIS — G40401 Other generalized epilepsy and epileptic syndromes, not intractable, with status epilepticus: Secondary | ICD-10-CM | POA: Diagnosis not present

## 2012-11-26 NOTE — Progress Notes (Signed)
Physical Therapy Treatment Patient Details Name: [name] MRN: [id number] DOB: 11-23-1930 Today's Date: 11/26/2012 Time: 4098-1191 PT Time Calculation (min): 30 min  PT Assessment / Plan / Recommendation Comments on Treatment Session  Much more alert and participative, R side weakness and incoordinatin noticeable.  Pt have more difficulty adding R UE into tasks.  Good rehab candidate.    Follow Up Recommendations  CIR     Does the patient have the potential to tolerate intense rehabilitation     Barriers to Discharge        Equipment Recommendations  Other (comment) (TBD post acute)    Recommendations for Other Services Rehab consult  Frequency Min 3X/week   Plan Discharge plan remains appropriate;Frequency remains appropriate    Precautions / Restrictions Precautions Precautions: Fall Precaution Comments: Pt reports dizziness with movement Required Braces or Orthoses: Cervical Brace Cervical Brace: Soft collar Restrictions Weight Bearing Restrictions: No   Pertinent Vitals/Pain     Mobility  Bed Mobility Bed Mobility: Rolling Left;Left Sidelying to Sit;Sitting - Scoot to Edge of Bed Rolling Left: 5: Supervision;With rail Left Sidelying to Sit: 4: Min guard;HOB flat;With rails Supine to Sit: 4: Min assist Sitting - Scoot to Edge of Bed: 4: Min guard Details for Bed Mobility Assistance: vc's for technique, hand placement Transfers Transfers: Sit to Stand;Stand to Sit Sit to Stand: 4: Min assist;With upper extremity assist;From bed;From chair/3-in-1 Stand to Sit: 4: Min guard;With upper extremity assist;To chair/3-in-1;To toilet Details for Transfer Assistance: vc's for hand placement, problem solving transfers given her weakness/stiffness Ambulation/Gait Ambulation/Gait Assistance: 4: Min assist Ambulation Distance (Feet): 20 Feet (to bathroom and 15 feet out to chair) Assistive device: Rolling walker Ambulation/Gait Assistance Details: tentative steps, vc's  for postural checks, assist to maneuver RW Gait Pattern: Step-through pattern;Decreased step length - right;Decreased step length - left;Decreased stride length;Shuffle;Trunk flexed Gait velocity: very slow Stairs: No    Exercises General Exercises - Upper Extremity Shoulder Flexion: AAROM;Right;10 reps Shoulder ABduction: AAROM;Right;10 reps Shoulder Horizontal ABduction: 10 reps Elbow Flexion: AAROM;Right;10 reps Wrist Flexion: AAROM;Right;10 reps Wrist Extension: AAROM;Right;10 reps Digit Composite Flexion: AROM;Right;10 reps Composite Extension: AROM;Right;10 reps   PT Diagnosis:    PT Problem List:   PT Treatment Interventions:     PT Goals Acute Rehab PT Goals PT Goal Formulation: With patient Time For Goal Achievement: 12/09/12 Potential to Achieve Goals: Good Pt will go Supine/Side to Sit: with supervision;with HOB 0 degrees PT Goal: Supine/Side to Sit - Progress: Progressing toward goal Pt will go Sit to Stand: with min assist PT Goal: Sit to Stand - Progress: Partly met Pt will Transfer Bed to Chair/Chair to Bed: with min assist PT Transfer Goal: Bed to Chair/Chair to Bed - Progress: Progressing toward goal Pt will Ambulate: 16 - 50 feet;with min assist;with least restrictive assistive device PT Goal: Ambulate - Progress: Progressing toward goal  Visit Information  Last PT Received On: 11/26/12 Assistance Needed: +2 (safety)    Subjective Data  Patient Stated Goal: Back home   Cognition  Cognition Arousal/Alertness: Awake/alert Behavior During Therapy: WFL for tasks assessed/performed Overall Cognitive Status: Within Functional Limits for tasks assessed    Balance  Balance Balance Assessed: Yes Static Sitting Balance Static Sitting - Balance Support: Left upper extremity supported;Right upper extremity supported;Feet supported Static Sitting - Level of Assistance: 5: Stand by assistance Static Standing Balance Static Standing - Balance Support: During  functional activity;Bilateral upper extremity supported Static Standing - Level of Assistance:  (min guard)  End of Session  PT - End of Session Activity Tolerance: Patient tolerated treatment well Patient left: in CPM;with call bell/phone within reach;with family/visitor present Nurse Communication: Mobility status   GP     Roe Wilner, Eliseo Gum 11/26/2012, 12:16 PM 11/26/2012  Christine Bing, PT [phone] (pager)

## 2012-11-26 NOTE — Clinical Social Work Note (Signed)
Clinical Social Worker spoke with patient at bedside to offer support and continue to discuss patient discharge plans.  Patient does not want CSW to initiate SNF search at this time - patient plans for inpatient rehab as only option.  CSW notified inpatient rehab admissions coordinator of patient wishes.  Plan is to fully evaluate patient plans tomorrow and if able may be able to admit tomorrow.  CSW remains available for support and to assist with patient discharge needs once medically stable.  Macario Golds, [place] [phone]

## 2012-11-26 NOTE — Progress Notes (Signed)
Pt wants Dr Janee Morn to call her daughter Selena Batten to give her information as to what is going on and how pt is doing. Daughter states she has asked for someone to call, but has not heard from anyone yet. Pt is giving verbal consent for information to be given to daughter(s) on her condition and plans for pt care. Selena Batten can be reached at [phone].    Spoke with trauma case manager, Marcelino Duster and she will let Dr Janee Morn know the above request for a call to the daughter.

## 2012-11-26 NOTE — Progress Notes (Signed)
Patient ID: [name], female   DOB: 05-13-1931, 79 y.o.   MRN: [id number] Per patient request I called her daughter, [name], at [phone].  I discussed the current situation and plan of care.  I also reviewed the findings of the consultants with her.  I answered her questions.  She is unable to come to [place] at this time and was appreciative of the update. [name], MD, MPH, FACS Pager: [phone]

## 2012-11-26 NOTE — Progress Notes (Addendum)
Occupational Therapy Treatment Patient Details Name: [name] MRN: [id number] DOB: 1931/07/20 Today's Date: 11/26/2012 Time: 2841-3244 OT Time Calculation (min): 30 min  OT Assessment / Plan / Recommendation Comments on Treatment Session Pt making progress with functional goals and should continue with OT services to maximize level of functional and safety. Pt's  R hand demos decreased edema with improved GMC/function. Pt easily distracted during functional tasks and exercises    Follow Up Recommendations  CIR    Barriers to Discharge   Pt lives at home alone    Equipment Recommendations  None recommended by OT    Recommendations for Other Services    Frequency Min 2X/week   Plan      Precautions / Restrictions Precautions Precautions: Fall;Cervical Precaution Comments: Pt reports dizziness with movement Required Braces or Orthoses: Cervical Brace Cervical Brace: Soft collar Restrictions Weight Bearing Restrictions: No   Pertinent Vitals/Pain 3/10 R hand    ADL  Grooming: Performed;Minimal assistance Where Assessed - Grooming: Supported standing Lower Body Dressing: Performed;Moderate assistance Toilet Transfer: Performed;Minimal assistance Toilet Transfer Method: Sit to stand Toilet Transfer Equipment: Raised toilet seat with arms (or 3-in-1 over toilet) Toileting - Clothing Manipulation and Hygiene: Performed;Moderate assistance Where Assessed - Toileting Clothing Manipulation and Hygiene: Standing Equipment Used: Gait belt;Rolling walker    OT Diagnosis:    OT Problem List:   OT Treatment Interventions:     OT Goals ADL Goals ADL Goal: Toilet Transfer - Progress: Progressing toward goals ADL Goal: Additional Goal #1 - Progress: Progressing toward goals Miscellaneous OT Goals OT Goal: Miscellaneous Goal #1 - Progress: Progressing toward goals OT Goal: Miscellaneous Goal #3 - Progress: Progressing toward goals  Visit Information  Last OT Received On:  11/26/12 Assistance Needed: +1 PT/OT Co-Evaluation/Treatment: Yes    Subjective Data  Subjective: " I am ready " Patient Stated Goal: To return home   Prior Functioning       Cognition  Cognition Arousal/Alertness: Awake/alert Overall Cognitive Status: Within Functional Limits for tasks assessed    Mobility  Bed Mobility Bed Mobility: Supine to Sit;Sitting - Scoot to [place] of Bed;Sit to Supine Rolling Left: 4: Min assist Supine to Sit: 4: Min assist Sitting - Scoot to [place] of Bed: 4: Min guard Transfers Transfers: Sit to Stand;Stand to Sit Sit to Stand: 3: Mod assist Stand to Sit: 4: Min assist Details for Transfer Assistance: vc's for hand placement, technique; assist to come forward, help with w/shift and stability for pivot with RW    Exercises  General Exercises - Upper Extremity Shoulder Flexion: AAROM;Right;10 reps Shoulder ABduction: AAROM;Right;10 reps Shoulder Horizontal ABduction: 10 reps Elbow Flexion: AAROM;Right;10 reps Wrist Flexion: AAROM;Right;10 reps Wrist Extension: AAROM;Right;10 reps Digit Composite Flexion: AROM;Right;10 reps Composite Extension: AROM;Right;10 reps   Balance Balance Balance Assessed: No   End of Session OT - End of Session Equipment Utilized During Treatment: Gait belt (RW, 3 in 1) Patient left: in chair;with call bell/phone within reach;with family/visitor present;with nursing in room Nurse Communication: Mobility status  GO     Galen Manila 11/26/2012, 11:59 AM

## 2012-11-26 NOTE — Progress Notes (Signed)
Patient ID: [name], female   DOB: 09-26-1930, 79 y.o.   MRN: [id number]    Subjective: Better, wants to work with therapies, no pain, agrees to take adderal  Objective: Vital signs in last 24 hours: Temp:  [97.4 F (36.3 C)-98.8 F (37.1 C)] 97.5 F (36.4 C) (04/17 0558) Pulse Rate:  [54-69] 54 (04/17 0558) Resp:  [18-20] 20 (04/17 0558) BP: (124-138)/(44-64) 124/57 mmHg (04/17 0558) SpO2:  [95 %-98 %] 97 % (04/17 0558) Last BM Date: 11/24/12  Intake/Output from previous day: 04/16 0701 - 04/17 0700 In: 900 [P.O.:900] Out: -  Intake/Output this shift:    General appearance: alert and cooperative Resp: clear to auscultation bilaterally Cardio: regular rate and rhythm GI: soft, NT Extremities: mild BLE edema, calves soft Neuro: A&O, MAE, BUE strength nearly equal today  Lab Results: CBC  No results found for this basename: WBC, HGB, HCT, PLT,  in the last 72 hours BMET No results found for this basename: NA, K, CL, CO2, GLUCOSE, BUN, CREATININE, CALCIUM,  in the last 72 hours PT/INR No results found for this basename: LABPROT, INR,  in the last 72 hours ABG No results found for this basename: PHART, PCO2, PO2, HCO3,  in the last 72 hours  Studies/Results: Ct Cervical Spine Wo Contrast  11/24/2012  *RADIOLOGY REPORT*  Clinical Data: Neck pain and right upper extremity weakness.  CT CERVICAL SPINE WITHOUT CONTRAST  Technique:  Multidetector CT imaging of the cervical spine was performed. Multiplanar CT image reconstructions were also generated.  Comparison: 11/19/2012.  Findings: Advanced degenerative cervical spondylosis with multilevel disc disease and facet disease.  Degenerative subluxation is noted at C4-5.  This appears stable.  The facets are normally aligned no other significant multilevel facet disease.  No facet or laminar fractures are identified.  The skull base C1 and C1-2 articulations are maintained although there is advanced degenerative changes with  posterior pannus.  The spinal canal is generous and is no spinal stenosis.  Mild multilevel foraminal stenosis due to uncinate spurring and facet disease.  No abnormal prevertebral soft tissue swelling.  There are dense carotid artery calcifications.  No neck mass or adenopathy.  The lung apices are grossly clear.  IMPRESSION: Severe degenerative cervical spondylosis with multilevel disc disease and facet disease.  The spinal canal is generous and there is no significant spinal stenosis.  Mild multilevel foraminal stenosis due to uncinate spurring and facet disease.   Original Report Authenticated By: Rudie Meyer, M.D.     Anti-infectives: Anti-infectives   Start     Dose/Rate Route Frequency Ordered Stop   11/23/12 1330  ciprofloxacin (CIPRO) tablet 500 mg     500 mg Oral 2 times daily 11/23/12 1216 11/28/12 0759      Assessment/Plan: PHBC Resp - stable since extubation SZ D/O - appreciate neuro F/U, no SZ activity, noted change to depakote RUE weakness - appreciate neuro F/U, may need outpatient EMG, seems improved today Psych - on home prozac and adderal, psychiatry  R/O SI and D/Cd sitter UTI - cipro day 4/5 PT/OT VTE - lovenox Dispo - CIR eval   LOS: 7 days    [name], MD, MPH, FACS Pager: [phone]  11/26/2012

## 2012-11-26 NOTE — Progress Notes (Addendum)
NEURO HOSPITALIST PROGRESS NOTE   SUBJECTIVE:                                                                                                                        Patietn is very labile and perseverating on how she would like to be taken off suicide precautions. No other complaints.   OBJECTIVE:                                                                                                                           Vital signs in last 24 hours: Temp:  [97.4 F (36.3 C)-98.8 F (37.1 C)] 97.5 F (36.4 C) (04/17 0558) Pulse Rate:  [54-69] 54 (04/17 0558) Resp:  [18-20] 20 (04/17 0558) BP: (124-138)/(44-64) 124/57 mmHg (04/17 0558) SpO2:  [95 %-98 %] 97 % (04/17 0558)  Intake/Output from previous day: 04/16 0701 - 04/17 0700 In: 900 [P.O.:900] Out: -  Intake/Output this shift:   Nutritional status: Dysphagia  Past Medical History  Diagnosis Date  . Depression   . Ovarian cyst   . Obesity   . HTN (hypertension)   . HLD (hyperlipidemia)   . Seizures 1980s    2/2 meningioma. none since cranioplasty  . Bipolar disorder   . Obesity   . Hiatal hernia     s/p repair  . GI bleed   . AVM (arteriovenous malformation)       Neurologic Exam:  Mental Status: Alert, oriented, thought content appropriate.  Speech fluent without evidence of aphasia.  Able to follow 3 step commands without difficulty. Cranial Nerves: II: Visual fields grossly normal, pupils show mild anisicoria, round, reactive to light and accommodation III,IV, VI: ptosis not present, extra-ocular motions intact bilaterally V,VII: smile symmetric, facial light touch sensation normal bilaterally VIII: hearing normal bilaterally IX,X: gag reflex present XI: bilateral shoulder shrug XII: midline tongue extension Motor: Right : Upper extremity   5/5    Left:     Upper extremity  See note  Lower extremity   note --when in room with CIR PA-C she was moving her left arm  antigravity both spontaneously and purposefully--when I went to formally examine patient she stated she could not move arm and showed no effort to try.  Again--while in room earlier she was moving bilateral legs antigravity--when I went to formally examine strength she started to cry and refused to move her legs for me. When noxious stimuli given she briskly withdrew.  Tone and bulk:normal tone throughout; no atrophy noted Sensory: Pinprick and light touch intact throughout, bilaterally Deep Tendon Reflexes: 1+ and symmetric throughout UE and absent LE   Lab Results: Lab Results  Component Value Date/Time   CHOL 148 11/14/2010  2:42 PM   Lipid Panel No results found for this basename: CHOL, TRIG, HDL, CHOLHDL, VLDL, LDLCALC,  in the last 72 hours  Studies/Results: Ct Cervical Spine Wo Contrast  11/24/2012  *RADIOLOGY REPORT*  Clinical Data: Neck pain and right upper extremity weakness.  CT CERVICAL SPINE WITHOUT CONTRAST  Technique:  Multidetector CT imaging of the cervical spine was performed. Multiplanar CT image reconstructions were also generated.  Comparison: 11/19/2012.  Findings: Advanced degenerative cervical spondylosis with multilevel disc disease and facet disease.  Degenerative subluxation is noted at C4-5.  This appears stable.  The facets are normally aligned no other significant multilevel facet disease.  No facet or laminar fractures are identified.  The skull base C1 and C1-2 articulations are maintained although there is advanced degenerative changes with posterior pannus.  The spinal canal is generous and is no spinal stenosis.  Mild multilevel foraminal stenosis due to uncinate spurring and facet disease.  No abnormal prevertebral soft tissue swelling.  There are dense carotid artery calcifications.  No neck mass or adenopathy.  The lung apices are grossly clear.  IMPRESSION: Severe degenerative cervical spondylosis with multilevel disc disease and facet disease.  The spinal canal  is generous and there is no significant spinal stenosis.  Mild multilevel foraminal stenosis due to uncinate spurring and facet disease.   Original Report Authenticated By: Rudie Meyer, M.D.     MEDICATIONS                                                                                                                        Scheduled: . amphetamine-dextroamphetamine  10 mg Oral Q breakfast  . antiseptic oral rinse  15 mL Mouth Rinse QID  . atorvastatin  10 mg Oral q1800  . chlorhexidine  15 mL Mouth Rinse BID  . ciprofloxacin  500 mg Oral BID  . divalproex  250 mg Oral Daily  . divalproex  500 mg Oral QHS  . enoxaparin (LOVENOX) injection  40 mg Subcutaneous Q24H  . FLUoxetine  40 mg Oral Daily  . metoprolol succinate  25 mg Oral Daily    ASSESSMENT/PLAN:  79 yo F with seizures in teh setting of previous brain surgery, now with inconsistent right arm weakness. CT C-spine showed DDD.  Question some functional overlay.  She has remained without seizure while in hospital.  Recent Depakote dose was changed to 250 mg in AM and 500 mg at night. No complaints of medication SE.   Recommend: 1) CIR 2) Consider outpatient EMG if RUE weakness continues over the next couple of weeks 3) Follow Depakote level while in CIR.   Neuro S/O  Assessment and plan discussed with with attending physician and they are in agreement.    Felicie Morn PA-C [facility] [phone]  11/26/2012, 10:49 AM

## 2012-11-26 NOTE — Consult Note (Signed)
Physical Medicine and Rehabilitation Consult  Reason for Consult: Seizures  Referring Physician:  Dr. Janee Morn [facility]  HPI: [name] is a 79 y.o. female with history of prior craniotomy and seizure disorder, bipolar disorder, depression; who was at [facility] on 11/19/12 and seemed to have been hit by a forklift cart. Patient fell to the ground and was noted to have left sided weakness. EMS was called and the patient was brought in as a code stroke.. On arrival she continued to have left sided weakness and was taken to the CT scan. There she was noted to have twitching on the left side of the face. This lasted for a few seconds then began to involve the left leg and traveled to the right. The patient also had left eye deviation. The patient was given Ativan and was able to complete the CT scan but on removal from the scanner had twitching on the right side of the face and in the right foot. Eyes continued to deviate to the left. She was intubated due to hypoxia and started on Depacon. CT head negative. She tolerated extubation. She with fluctuating RUE weakness with mild speech deficits and neurology recommends MRI for work up as well as continuing Depakote at current dose.   Psychiatry consulted due to concerns of SI and felt that patient with recurrent major depression but no evidence of sucidial  ideation. SI. PT/OT evaluations done and CIR recommended.   Has had chronic balance issues noted by neighbor. Using cane at times. Brief to no loss of consciousness at [employer], Remembered workers getting a back board. She then did not remember anything after seizure started.   Review of Systems  HENT: Negative for hearing loss.   Eyes: Positive for double vision (since admission).  Respiratory: Negative for cough, shortness of breath and wheezing.   Cardiovascular: Negative for chest pain and palpitations.  Gastrointestinal: Negative for heartburn, nausea and abdominal pain.  Musculoskeletal: Positive for  back pain (chronic) and joint pain (Right ankle and right thumb pain. ).       Balance problems for past few months (with near falls per friend). Chronic right RTC problems.   Neurological: Positive for sensory change (numbness left foot). Negative for dizziness, tingling and headaches.  Psychiatric/Behavioral: Positive for depression (Reports has been depressed/not taking care of home. ).   Past Medical History  Diagnosis Date  . Depression   . Ovarian cyst   . Obesity   . HTN (hypertension)   . HLD (hyperlipidemia)   . Seizures 1980s    2/2 meningioma. none since cranioplasty  . Bipolar disorder   . Obesity   . Hiatal hernia     s/p repair  . GI bleed   . AVM (arteriovenous malformation)    Past Surgical History  Procedure Laterality Date  . Hiatal hernia repair    . Breast reduction surgery    . Cranioplasty      2/2 meningioma 1980s  . Knee arthroplasty      bilat   Family History  Problem Relation Age of Onset  . Alzheimer's disease Mother   . Heart disease Father     MI 7yo  . Heart disease Brother    Social History: Lives alone.  Independent but with gait problems-furniture walked at home. Children in [place]. Her neighbor checks on her daily. She reports that she has never smoked. She has never used smokeless tobacco. She reports that she does not drink alcohol or use illicit drugs.  Allergies  Allergen Reactions  . Amoxicillin Anaphylaxis    REACTION: unspecified  . Penicillins Anaphylaxis  . Morphine Sulfate Other (See Comments)    unknown  . Phenobarbital Other (See Comments)    unknown  . Phenytoin Other (See Comments)    unknown   Medications Prior to Admission  Medication Sig Dispense Refill  . albuterol (PROVENTIL HFA;VENTOLIN HFA) 108 (90 BASE) MCG/ACT inhaler Inhale 2 puffs into the lungs every 4 (four) hours as needed for wheezing or shortness of breath.      . amphetamine-dextroamphetamine (ADDERALL) 10 MG tablet Take 1 tablet (10 mg  total) by mouth daily.  90 tablet  0  . aspirin EC 81 MG tablet Take 81 mg by mouth daily.      Marland Kitchen atorvastatin (LIPITOR) 10 MG tablet Take 10 mg by mouth daily.      Marland Kitchen FLUoxetine (PROZAC) 20 MG capsule Take 60 mg by mouth daily.      Marland Kitchen losartan-hydrochlorothiazide (HYZAAR) 100-25 MG per tablet Take 1 tablet by mouth daily.      . metoprolol succinate (TOPROL-XL) 25 MG 24 hr tablet Take 25 mg by mouth daily.        Home: Home Living Lives With: Alone Available Help at Discharge:  (friends prn) Type of Home: House Home Layout: One level Bathroom Shower/Tub: Health visitor: Standard Home Adaptive Equipment: Reacher;Walker - rolling  Functional History: Prior Function Able to Take Stairs?: Yes Driving: Yes Vocation: Retired Functional Status:  Mobility: Bed Mobility Bed Mobility: Supine to Sit;Sitting - Scoot to [place] of Bed;Sit to Supine Rolling Left: 3: Mod assist Left Sidelying to Sit: 3: Mod assist Sitting - Scoot to Edge of Bed: 4: Min assist Sit to Supine: 1: +2 Total assist Sit to Supine: Patient Percentage: 10% Transfers Transfers: Sit to Stand;Stand to Sit;Stand Pivot Transfers Sit to Stand: 2: Max assist;With upper extremity assist;From bed Sit to Stand: Patient Percentage: 50% Stand to Sit: 3: Mod assist;With upper extremity assist;To chair/3-in-1 Stand to Sit: Patient Percentage: 50% Stand Pivot Transfers: 2: Max assist Ambulation/Gait Ambulation/Gait Assistance: Not tested (comment) Stairs: No Wheelchair Mobility Wheelchair Mobility: No  ADL: ADL Eating/Feeding: Simulated;Maximal assistance Where Assessed - Eating/Feeding: Bed level Grooming: Simulated;Maximal assistance Where Assessed - Grooming: Supine, head of bed up Upper Body Bathing: Simulated;Maximal assistance Where Assessed - Upper Body Bathing: Supine, head of bed up Lower Body Bathing: Simulated;+1 Total assistance (+2 sit to stand) Where Assessed - Lower Body Bathing: Supported  sit to stand Upper Body Dressing: Simulated;+1 Total assistance Where Assessed - Upper Body Dressing: Unsupported sitting Lower Body Dressing: Simulated;+1 Total assistance (+2 sit to stand) Where Assessed - Lower Body Dressing: Supported sit to Pharmacist, hospital:  (Unable to this time to move feet once she is up on them) Equipment Used: Gait belt Transfers/Ambulation Related to ADLs: total A +2 (Pt=50%) sit to stand and stand to sit  Cognition: Cognition Overall Cognitive Status: Within Functional Limits for tasks assessed Arousal/Alertness: Awake/alert Orientation Level: Oriented X4 (FORGETFUL AT TIMES, cannot remember things from yesterday) Cognition Arousal/Alertness: Awake/alert Behavior During Therapy: WFL for tasks performed Overall Cognitive Status: Within Functional Limits for tasks assessed Orientation Level: Appears intact for tasks assessed  Blood pressure 124/57, pulse 54, temperature 97.5 F (36.4 C), temperature source Oral, resp. rate 20, height 5\' 6"  (1.676 m), weight 103.4 kg (227 lb 15.3 oz), SpO2 97.00%. Physical Exam  Nursing note and vitals reviewed. Constitutional: She is oriented to person, place, and time. She appears  well-developed and well-nourished.  HENT:  Head: Normocephalic and atraumatic.  Eyes: Pupils are equal, round, and reactive to light.  Neck: Normal range of motion. Neck supple.  Cardiovascular: Normal rate and regular rhythm.   Pulmonary/Chest: Effort normal and breath sounds normal.  Abdominal: Soft. Bowel sounds are normal.  Musculoskeletal:  1+ pedal edema on right. Right thumb painful to touch. Healed old scars on wrist and hand (carpel tunnel repair?).    Neurological: She is alert and oriented to person, place, and time.  Speech clear. Able to follow basic commands without difficulty. Pain with attempts at ROM right wrist due to thumb pain? RUE weakness ( acute on chronic due to RTC problems?). Easily distracted with poor awareness  and lack of insight. Jokes to cover up. Needs redirection occasionally. Impaired  memory--unable to recall having PT yesterday.  Skin: Skin is warm and dry.  Motor strength 5/5 in bilateral deltoid, biceps, triceps, grip, hip flexor, knee extensors, ankle dorsi flexion plantar flexion Sensation intact to light touch in bilateral upper and lower limbs Finger nose to finger testing is intact Shows gait apraxia. Needs instruction to move her legs. She is instruction getting from a sit-to-stand position. Memory he remembers two out of three unrelated objects after 2 minute delay Unable to do serial sevens at all Is able to count down from 10-1  No results found for this or any previous visit (from the past 24 hour(s)). Ct Cervical Spine Wo Contrast  11/24/2012  *RADIOLOGY REPORT*  Clinical Data: Neck pain and right upper extremity weakness.  CT CERVICAL SPINE WITHOUT CONTRAST  Technique:  Multidetector CT imaging of the cervical spine was performed. Multiplanar CT image reconstructions were also generated.  Comparison: 11/19/2012.  Findings: Advanced degenerative cervical spondylosis with multilevel disc disease and facet disease.  Degenerative subluxation is noted at C4-5.  This appears stable.  The facets are normally aligned no other significant multilevel facet disease.  No facet or laminar fractures are identified.  The skull base C1 and C1-2 articulations are maintained although there is advanced degenerative changes with posterior pannus.  The spinal canal is generous and is no spinal stenosis.  Mild multilevel foraminal stenosis due to uncinate spurring and facet disease.  No abnormal prevertebral soft tissue swelling.  There are dense carotid artery calcifications.  No neck mass or adenopathy.  The lung apices are grossly clear.  IMPRESSION: Severe degenerative cervical spondylosis with multilevel disc disease and facet disease.  The spinal canal is generous and there is no significant spinal  stenosis.  Mild multilevel foraminal stenosis due to uncinate spurring and facet disease.   Original Report Authenticated By: Rudie Meyer, M.D.     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 1. Does the need for close, 24 hr/day medical supervision in concert with the patient's rehab needs make it unreasonable for this patient to be served in a less intensive setting? Yes 2. Co-Morbidities requiring supervision/potential complications: History of bipolar disorder, peripheral neuropathy, right rotator cuff syndrome, NST EMI 3. Due to bladder management, safety, disease management, medication administration, pain management and patient education, does the patient require 24 hr/day rehab nursing? Yes 4. Does the patient require coordinated care of a physician, rehab nurse, PT (1-2 hrs/day, 5 days/week), OT (1-2 hrs/day, 5 days/week) and SLP (0.5-1 hrs/day, 5 days/week) to address physical and functional deficits in the context of the above medical diagnosis(es)? Yes Addressing deficits in the following  areas: balance, endurance, locomotion, strength, transferring, bowel/bladder control, bathing, dressing, grooming, toileting and cognition 5. Can the patient actively participate in an intensive therapy program of at least 3 hrs of therapy per day at least 5 days per week? Yes 6. The potential for patient to make measurable gains while on inpatient rehab is good 7. Anticipated functional outcomes upon discharge from inpatient rehab are Modified independent mobility with PT, Modified independent ADLs with OT, Improve memory attention and concentration for home management with SLP. 8. Estimated rehab length of stay to reach the above functional goals is: 7-10 days 9. Does the patient have adequate social supports to accommodate these discharge functional goals? Yes 10. Anticipated D/C setting:  Home 11. Anticipated post D/C treatments: HH therapy 12. Overall Rehab/Functional Prognosis: good  RECOMMENDATIONS: This patient's condition is appropriate for continued rehabilitative care in the following setting: CIR Patient has agreed to participate in recommended program. Yes Note that insurance prior authorization may be required for reimbursement for recommended care.  Comment:    11/26/2012

## 2012-11-27 ENCOUNTER — Inpatient Hospital Stay (HOSPITAL_COMMUNITY)
Admission: RE | Admit: 2012-11-27 | Discharge: 2012-12-04 | DRG: 945 | Disposition: A | Discharge status: Home Health | Payer: Medicare Other | Source: Intra-hospital | Attending: Physical Medicine & Rehabilitation | Admitting: Physical Medicine & Rehabilitation

## 2012-11-27 ENCOUNTER — Encounter (HOSPITAL_COMMUNITY): Payer: Self-pay | Admitting: Physical Medicine and Rehabilitation

## 2012-11-27 ENCOUNTER — Inpatient Hospital Stay (HOSPITAL_COMMUNITY): Payer: Medicare Other

## 2012-11-27 ENCOUNTER — Encounter (HOSPITAL_COMMUNITY): Payer: Self-pay | Admitting: *Deleted

## 2012-11-27 DIAGNOSIS — N390 Urinary tract infection, site not specified: Secondary | ICD-10-CM | POA: Diagnosis present

## 2012-11-27 DIAGNOSIS — Z5189 Encounter for other specified aftercare: Secondary | ICD-10-CM | POA: Diagnosis not present

## 2012-11-27 DIAGNOSIS — S060X0A Concussion without loss of consciousness, initial encounter: Secondary | ICD-10-CM | POA: Diagnosis present

## 2012-11-27 DIAGNOSIS — F329 Major depressive disorder, single episode, unspecified: Secondary | ICD-10-CM | POA: Diagnosis present

## 2012-11-27 DIAGNOSIS — S06300A Unspecified focal traumatic brain injury without loss of consciousness, initial encounter: Secondary | ICD-10-CM | POA: Diagnosis present

## 2012-11-27 DIAGNOSIS — E669 Obesity, unspecified: Secondary | ICD-10-CM | POA: Diagnosis present

## 2012-11-27 DIAGNOSIS — F339 Major depressive disorder, recurrent, unspecified: Secondary | ICD-10-CM | POA: Diagnosis present

## 2012-11-27 DIAGNOSIS — Z7982 Long term (current) use of aspirin: Secondary | ICD-10-CM | POA: Diagnosis not present

## 2012-11-27 DIAGNOSIS — R609 Edema, unspecified: Secondary | ICD-10-CM | POA: Diagnosis present

## 2012-11-27 DIAGNOSIS — M159 Polyosteoarthritis, unspecified: Secondary | ICD-10-CM | POA: Diagnosis present

## 2012-11-27 DIAGNOSIS — M25519 Pain in unspecified shoulder: Secondary | ICD-10-CM | POA: Diagnosis not present

## 2012-11-27 DIAGNOSIS — I10 Essential (primary) hypertension: Secondary | ICD-10-CM | POA: Diagnosis present

## 2012-11-27 DIAGNOSIS — B009 Herpesviral infection, unspecified: Secondary | ICD-10-CM | POA: Diagnosis present

## 2012-11-27 DIAGNOSIS — G40802 Other epilepsy, not intractable, without status epilepticus: Secondary | ICD-10-CM | POA: Diagnosis present

## 2012-11-27 DIAGNOSIS — S069X9A Unspecified intracranial injury with loss of consciousness of unspecified duration, initial encounter: Secondary | ICD-10-CM

## 2012-11-27 DIAGNOSIS — M719 Bursopathy, unspecified: Secondary | ICD-10-CM | POA: Diagnosis present

## 2012-11-27 DIAGNOSIS — B964 Proteus (mirabilis) (morganii) as the cause of diseases classified elsewhere: Secondary | ICD-10-CM | POA: Diagnosis present

## 2012-11-27 DIAGNOSIS — M67919 Unspecified disorder of synovium and tendon, unspecified shoulder: Secondary | ICD-10-CM | POA: Diagnosis present

## 2012-11-27 DIAGNOSIS — M1990 Unspecified osteoarthritis, unspecified site: Secondary | ICD-10-CM | POA: Diagnosis present

## 2012-11-27 DIAGNOSIS — F319 Bipolar disorder, unspecified: Secondary | ICD-10-CM | POA: Diagnosis present

## 2012-11-27 DIAGNOSIS — R569 Unspecified convulsions: Secondary | ICD-10-CM

## 2012-11-27 DIAGNOSIS — W240XXA Contact with lifting devices, not elsewhere classified, initial encounter: Secondary | ICD-10-CM

## 2012-11-27 DIAGNOSIS — E876 Hypokalemia: Secondary | ICD-10-CM | POA: Diagnosis present

## 2012-11-27 DIAGNOSIS — Z79899 Other long term (current) drug therapy: Secondary | ICD-10-CM | POA: Diagnosis not present

## 2012-11-27 DIAGNOSIS — F3289 Other specified depressive episodes: Secondary | ICD-10-CM | POA: Diagnosis present

## 2012-11-27 DIAGNOSIS — M6281 Muscle weakness (generalized): Secondary | ICD-10-CM | POA: Diagnosis not present

## 2012-11-27 DIAGNOSIS — R296 Repeated falls: Secondary | ICD-10-CM | POA: Diagnosis present

## 2012-11-27 MED ORDER — IBUPROFEN 400 MG PO TABS
400.0000 mg | ORAL_TABLET | Freq: Three times a day (TID) | ORAL | Status: DC | PRN
  Administered 2012-12-03 (×2): 400 mg via ORAL
  Filled 2012-11-27 (×4): qty 1

## 2012-11-27 MED ORDER — FLEET ENEMA 7-19 GM/118ML RE ENEM: 1.0000 | ENEMA | Freq: Once | RECTAL | Status: AC | PRN

## 2012-11-27 MED ORDER — ENOXAPARIN SODIUM 40 MG/0.4ML ~~LOC~~ SOLN: 40.0000 mg | SUBCUTANEOUS | Status: DC

## 2012-11-27 MED ORDER — CIPROFLOXACIN HCL 500 MG PO TABS
500.0000 mg | ORAL_TABLET | Freq: Two times a day (BID) | ORAL | Status: AC
  Administered 2012-11-27: 500 mg via ORAL
  Filled 2012-11-27: qty 1

## 2012-11-27 MED ORDER — TRAZODONE HCL 50 MG PO TABS: 25.0000 mg | ORAL_TABLET | Freq: Every evening | ORAL | Status: DC | PRN

## 2012-11-27 MED ORDER — PROCHLORPERAZINE MALEATE 5 MG PO TABS
5.0000 mg | ORAL_TABLET | Freq: Four times a day (QID) | ORAL | Status: DC | PRN
  Filled 2012-11-27: qty 2

## 2012-11-27 MED ORDER — ACETAMINOPHEN 325 MG PO TABS
325.0000 mg | ORAL_TABLET | ORAL | Status: DC | PRN
  Administered 2012-11-28 – 2012-12-02 (×5): 650 mg via ORAL
  Filled 2012-11-27 (×6): qty 2

## 2012-11-27 MED ORDER — ALUM & MAG HYDROXIDE-SIMETH 200-200-20 MG/5ML PO SUSP: 30.0000 mL | ORAL | Status: DC | PRN

## 2012-11-27 MED ORDER — GUAIFENESIN-DM 100-10 MG/5ML PO SYRP: 5.0000 mL | ORAL_SOLUTION | Freq: Four times a day (QID) | ORAL | Status: DC | PRN

## 2012-11-27 MED ORDER — ALBUTEROL SULFATE HFA 108 (90 BASE) MCG/ACT IN AERS: 2.0000 | INHALATION_SPRAY | RESPIRATORY_TRACT | Status: DC | PRN

## 2012-11-27 MED ORDER — DICLOFENAC SODIUM 1 % TD GEL
2.0000 g | Freq: Four times a day (QID) | TRANSDERMAL | Status: DC
  Administered 2012-11-27 – 2012-12-04 (×11): 2 g via TOPICAL
  Filled 2012-11-27: qty 100

## 2012-11-27 MED ORDER — FLUOXETINE HCL 20 MG PO CAPS
40.0000 mg | ORAL_CAPSULE | Freq: Every day | ORAL | Status: DC
  Administered 2012-11-28 – 2012-12-04 (×7): 40 mg via ORAL
  Filled 2012-11-27 (×8): qty 2

## 2012-11-27 MED ORDER — PROCHLORPERAZINE EDISYLATE 5 MG/ML IJ SOLN
5.0000 mg | Freq: Four times a day (QID) | INTRAMUSCULAR | Status: DC | PRN
  Filled 2012-11-27: qty 2

## 2012-11-27 MED ORDER — ATORVASTATIN CALCIUM 10 MG PO TABS
10.0000 mg | ORAL_TABLET | Freq: Every day | ORAL | Status: DC
  Administered 2012-11-27 – 2012-12-03 (×7): 10 mg via ORAL
  Filled 2012-11-27 (×8): qty 1

## 2012-11-27 MED ORDER — CHLORHEXIDINE GLUCONATE 0.12 % MT SOLN
15.0000 mL | Freq: Two times a day (BID) | OROMUCOSAL | Status: DC
  Administered 2012-11-27 – 2012-12-02 (×10): 15 mL via OROMUCOSAL
  Filled 2012-11-27 (×14): qty 15

## 2012-11-27 MED ORDER — AMPHETAMINE-DEXTROAMPHETAMINE 10 MG PO TABS
10.0000 mg | ORAL_TABLET | Freq: Every day | ORAL | Status: DC
  Administered 2012-11-28 – 2012-12-04 (×7): 10 mg via ORAL
  Filled 2012-11-27 (×7): qty 1

## 2012-11-27 MED ORDER — ENOXAPARIN SODIUM 40 MG/0.4ML ~~LOC~~ SOLN
40.0000 mg | SUBCUTANEOUS | Status: DC
  Administered 2012-11-27 – 2012-12-03 (×7): 40 mg via SUBCUTANEOUS
  Filled 2012-11-27 (×8): qty 0.4

## 2012-11-27 MED ORDER — METOPROLOL SUCCINATE ER 25 MG PO TB24
25.0000 mg | ORAL_TABLET | Freq: Every day | ORAL | Status: DC
  Administered 2012-11-27 – 2012-12-04 (×8): 25 mg via ORAL
  Filled 2012-11-27 (×9): qty 1

## 2012-11-27 MED ORDER — POLYETHYLENE GLYCOL 3350 17 G PO PACK
17.0000 g | PACK | Freq: Every day | ORAL | Status: DC | PRN
  Filled 2012-11-27: qty 1

## 2012-11-27 MED ORDER — CELECOXIB 200 MG PO CAPS
200.0000 mg | ORAL_CAPSULE | Freq: Every day | ORAL | Status: DC
  Administered 2012-11-27 – 2012-12-01 (×5): 200 mg via ORAL
  Filled 2012-11-27 (×6): qty 1

## 2012-11-27 MED ORDER — BIOTENE DRY MOUTH MT LIQD
15.0000 mL | Freq: Four times a day (QID) | OROMUCOSAL | Status: DC
  Administered 2012-11-28 – 2012-12-02 (×3): 15 mL via OROMUCOSAL

## 2012-11-27 MED ORDER — PROCHLORPERAZINE 25 MG RE SUPP
12.5000 mg | Freq: Four times a day (QID) | RECTAL | Status: DC | PRN
  Filled 2012-11-27: qty 1

## 2012-11-27 MED ORDER — DIVALPROEX SODIUM 250 MG PO DR TAB
250.0000 mg | DELAYED_RELEASE_TABLET | Freq: Every day | ORAL | Status: DC
  Administered 2012-11-28 – 2012-12-04 (×7): 250 mg via ORAL
  Filled 2012-11-27 (×8): qty 1

## 2012-11-27 MED ORDER — BISACODYL 10 MG RE SUPP: 10.0000 mg | Freq: Every day | RECTAL | Status: DC | PRN

## 2012-11-27 MED ORDER — DIVALPROEX SODIUM 500 MG PO DR TAB
500.0000 mg | DELAYED_RELEASE_TABLET | Freq: Every day | ORAL | Status: DC
  Administered 2012-11-27 – 2012-12-03 (×7): 500 mg via ORAL
  Filled 2012-11-27 (×8): qty 1

## 2012-11-27 NOTE — Clinical Social Work Note (Signed)
Clinical Social Worker following for possible placement needs.  CSW received phone call from inpatient rehab admissions coordinator who states that patient has been accepted and there is a bed available for her today.  Patient to discharge to inpatient rehab today.  Clinical Social Worker will sign off for now as social work intervention is no longer needed. Please consult [place] again if new need arises.  Macario Golds, [place] [phone]

## 2012-11-27 NOTE — Progress Notes (Signed)
Patient arrived to unit via wheelchair.  Alert and oriented x4.  Vitals stable.  Denies pain.  Bruising noted to LUE.  Patient oriented to room and unit.  Will continue to monitor.

## 2012-11-27 NOTE — H&P (Signed)
Physical Medicine and Rehabilitation Admission H&P    Chief Complaint  Patient presents with  . BI with recurrent seizures.  : HPI: [name] is a 79 y.o. female with history of prior craniotomy and seizure disorder, bipolar disorder, depression; who was at [facility] on 11/19/12 and seemed to have been hit by a forklift cart. Patient fell to the ground and was noted to have left sided weakness. EMS was called and the patient was brought in as a code stroke.. On arrival she continued to have left sided weakness and was taken to the CT scan. There she was noted to have twitching on the left side of the face. This lasted for a few seconds then began to involve the left leg and traveled to the right. The patient also had left eye deviation. The patient was given Ativan and was able to complete the CT scan but on removal from the scanner had twitching on the right side of the face and in the right foot. Eyes continued to deviate to the left. She was intubated due to hypoxia and started on Depacon. CT head negative. She tolerated extubation. She with fluctuating RUE weakness with mild speech deficits and neurology recommends MRI of brain for work up as well as continuing Depakote at current dose. Psychiatry consulted due to concerns of SI and felt that patient with recurrent major depression but no evidence of sucidial ideation. SI. PT/OT evaluations done and CIR recommended.    .Review of Systems  HENT: Negative for hearing loss.   Eyes: Positive for double vision (since trauma).  Respiratory: Positive for cough. Negative for shortness of breath and wheezing.   Cardiovascular: Negative for chest pain and palpitations.  Gastrointestinal: Negative for heartburn, nausea, vomiting and constipation.  Genitourinary: Negative for urgency and frequency.  Musculoskeletal: Positive for myalgias, back pain and joint pain (wants her ibuprofen back--stiff all over.).  Neurological: Positive for focal weakness  (RUE). Negative for headaches.  Psychiatric/Behavioral: Positive for memory loss. The patient is nervous/anxious.    Past Medical History  Diagnosis Date  . Depression     sucide attempt in the distant past  . Ovarian cyst   . Obesity   . HTN (hypertension)   . HLD (hyperlipidemia)   . Seizures 1980s    2/2 meningioma. none since cranioplasty  . Bipolar disorder   . Obesity   . Hiatal hernia     s/p repair  . GI bleed   . AVM (arteriovenous malformation)   . Low back pain     ESI by Dr. Ethelene Hal 2 weeks ago  . OA (osteoarthritis)     bilateral wrist, bilateral knees  . Rotator cuff arthropathy     right  . Right wrist injury     multiple surgeries and residual weakness.    Past Surgical History  Procedure Laterality Date  . Hiatal hernia repair    . Breast reduction surgery    . Cranioplasty      2/2 meningioma 1980s  . Knee arthroplasty      bilat   Family History  Problem Relation Age of Onset  . Alzheimer's disease Mother   . Heart disease Father     MI 30yo  . Heart disease Brother    Social History: Lives alone. 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 Independent but with gait problems-furniture walked at home. Children in [place]. Her neighbor checks on her daily. She reports that she has never smoked. She has never used smokeless tobacco. She  reports that she does not drink alcohol or use illicit drugs   Allergies  Allergen Reactions  . Amoxicillin Anaphylaxis    REACTION: unspecified  . Penicillins Anaphylaxis  . Morphine Sulfate Other (See Comments)    unknown  . Phenobarbital Other (See Comments)    unknown  . Phenytoin Other (See Comments)    unknown   Medications Prior to Admission  Medication Sig Dispense Refill  . albuterol (PROVENTIL HFA;VENTOLIN HFA) 108 (90 BASE) MCG/ACT inhaler Inhale 2 puffs into the lungs every 4 (four) hours as needed for wheezing or shortness of breath.      . amphetamine-dextroamphetamine (ADDERALL) 10 MG tablet  Take 1 tablet (10 mg total) by mouth daily.  90 tablet  0  . aspirin EC 81 MG tablet Take 81 mg by mouth daily.      Marland Kitchen atorvastatin (LIPITOR) 10 MG tablet Take 10 mg by mouth daily.      Marland Kitchen FLUoxetine (PROZAC) 20 MG capsule Take 60 mg by mouth daily.      Marland Kitchen losartan-hydrochlorothiazide (HYZAAR) 100-25 MG per tablet Take 1 tablet by mouth daily.      . metoprolol succinate (TOPROL-XL) 25 MG 24 hr tablet Take 25 mg by mouth daily.        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  Functional Status:  Mobility: Bed Mobility Bed Mobility: Rolling Left;Left Sidelying to Sit;Sitting - Scoot to Edge of Bed Rolling Left: 5: Supervision;With rail Left Sidelying to Sit: 4: Min guard;HOB flat;With rails Supine to Sit: 4: Min assist Sitting - Scoot to Edge of Bed: 4: Min guard Sit to Supine: 1: +2 Total assist Sit to Supine: Patient Percentage: 10% Transfers Transfers: Sit to Stand;Stand to Sit Sit to Stand: 4: Min assist;With upper extremity assist;From bed;From chair/3-in-1 Sit to Stand: Patient Percentage: 50% Stand to Sit: 4: Min guard;With upper extremity assist;To chair/3-in-1;To toilet Stand to Sit: Patient Percentage: 50% Stand Pivot Transfers: 2: Max assist Ambulation/Gait Ambulation/Gait Assistance: 4: Min assist Ambulation Distance (Feet): 20 Feet (to bathroom and 15 feet out to chair) Assistive device: Rolling walker Ambulation/Gait Assistance Details: tentative steps, vc's for postural checks, assist to maneuver RW Gait Pattern: Step-through pattern;Decreased step length - right;Decreased step length - left;Decreased stride  length;Shuffle;Trunk flexed Gait velocity: very slow Stairs: No Wheelchair Mobility Wheelchair Mobility: No  ADL: ADL Eating/Feeding: Simulated;Maximal assistance Where Assessed - Eating/Feeding: Bed level Grooming: Performed;Minimal assistance Where Assessed - Grooming: Supported standing Upper Body Bathing: Simulated;Maximal assistance Where Assessed - Upper Body Bathing: Supine, head of bed up Lower Body Bathing: Simulated;+1 Total assistance (+2 sit to stand) Where Assessed - Lower Body Bathing: Supported sit to stand Upper Body Dressing: Simulated;+1 Total assistance Where Assessed - Upper Body Dressing: Unsupported sitting Lower Body Dressing: Performed;Moderate assistance Where Assessed - Lower Body Dressing: Supported sit to Pharmacist, hospital: Performed;Minimal Dentist Method: Sit to Barista: Raised toilet seat with arms (or 3-in-1 over toilet) Equipment Used: Gait belt;Rolling walker Transfers/Ambulation Related to ADLs: total A +2 (Pt=50%) sit to stand and stand to sit  Cognition: Cognition Overall Cognitive Status: Within Functional Limits for tasks assessed Arousal/Alertness: Awake/alert Orientation Level: Oriented  X4 Cognition Arousal/Alertness: Awake/alert Behavior During Therapy: WFL for tasks assessed/performed Overall Cognitive Status: Within Functional Limits for tasks assessed Orientation Level: Appears intact for tasks assessed  Physical Exam: Blood pressure 132/59, pulse 57, temperature 97.6 F (36.4 C), temperature source Oral, resp. rate 18, height 5\' 6"  (1.676 m), weight 103.4 kg (227 lb 15.3 oz), SpO2 100.00%. Physical Exam  Vitals reviewed. Constitutional: She is oriented to person, place, and time. She appears well-developed and well-nourished.  HENT:  Head: Normocephalic and atraumatic.  Eyes: Pupils are equal, round, and reactive to light.  Neck: Normal range of motion.  Cardiovascular: Normal  rate and regular rhythm.   Pulmonary/Chest: Effort normal and breath sounds normal.  Abdominal: Soft. Bowel sounds are normal.  Neurological: She is alert and oriented to person, place, and time.  Speech clear. Able to follow simple commands without difficulty. Not as distracted today but needs redirections occasionally due to perseveration on certain subjects. Impaired insight with poor awareness. Right upper extremity movement limited mostly due to pain. Otherwise strength grossly 3 to 4/5. No gross sensory findings.      No results found for this or any previous visit (from the past 48 hour(s)). No results found.  Post Admission Physician Evaluation: 1. Functional deficits secondary  to mild to  Moderate TBI after fall. Seizure disorder. 2. Patient is admitted to receive collaborative, interdisciplinary care between the physiatrist, rehab nursing staff, and therapy team. 3. Patient's level of medical complexity and substantial therapy needs in context of that medical necessity cannot be provided at a lesser intensity of care such as a SNF. 4. Patient has experienced substantial functional loss from his/her baseline which was documented above under the "Functional History" and "Functional Status" headings.  Judging by the patient's diagnosis, physical exam, and functional history, the patient has potential for functional progress which will result in measurable gains while on inpatient rehab.  These gains will be of substantial and practical use upon discharge  in facilitating mobility and self-care at the household level. 5. Physiatrist will provide 24 hour management of medical needs as well as oversight of the therapy plan/treatment and provide guidance as appropriate regarding the interaction of the two. 6. 24 hour rehab nursing will assist with bladder management, bowel management, safety, skin/wound care, disease management, medication administration, pain management and patient education   and help integrate therapy concepts, techniques,education, etc. 7. PT will assess and treat for/with: Lower extremity strength, range of motion, stamina, balance, functional mobility, safety, adaptive techniques and equipment, NMR, cognitive perceptual rx, behavior mod, pain.   Goals are: mod I to supervision. 8. OT will assess and treat for/with: ADL's, functional mobility, safety, upper extremity strength, adaptive techniques and equipment, NMR, CPT behavior mod.   Goals are: mod I to supervision. 9. SLP will assess and treat for/with: cognition, behavior.  Goals are: mod I to supervision. 10. Case Management and Social Worker will assess and treat for psychological issues and discharge planning. 11. Team conference will be held weekly to assess progress toward goals and to determine barriers to discharge. 12. Patient will receive at least 3 hours of therapy per day at least 5 days per week. 13. ELOS: 10-14 days      Prognosis:  excellent   Medical Problem List and Plan: 1. DVT Prophylaxis/Anticoagulation: Pharmaceutical: Lovenox 2. Low back pain and OA: uses ibuprofen 800 mg/hs and currently stiff due to immobility and lack of NSAIDS. SE concerns reviewed. Will start celebrex.  Will add Voltaren and and  gel to help with pain right thumb.  3. Bipolar disorder: Continue Prozac. On adderall for activation/energy levels. Mood reported to be labile at times. Will monitor and provide ego support. LCSW to follow for evaluation. Neuro psych evaluation for mood/compentancy.  4. Neuropsych: This patient is is not capable of making decisions on his/her own behalf. 5. Seizures:  Seizure free on current dose of Depakote.. Recheck levels 04/21. 6. Proteus UTI: Completes  cipro D# 5/5 today.  7. HTN: Monitor with bid checks. Off hyzaar at current time. Monitor for trends as activity increases.  8.  RTC pathology: Will check xrays to rule out acute bony changes. Conservative approach for RTC pain and to improve  functional use of shoulder 9. Hypokalemia: likely dilutional effects. Elevated at admission likely due to ace. Will recheck on 0421. 10. Peripheral edema: Will add low salt restrictions with TEDs. Diuretics as BP improves. Check orthostatic BP.  Ranelle Oyster, MD, [place] [facility]    11/27/2012

## 2012-11-27 NOTE — Progress Notes (Signed)
  Subjective: No complaints Ready for transfer to inpatient rehab  Objective: Vital signs in last 24 hours: Temp:  [97.6 F (36.4 C)-98.2 F (36.8 C)] 97.6 F (36.4 C) (04/18 0702) Pulse Rate:  [57-65] 57 (04/18 0702) Resp:  [18-20] 18 (04/18 0702) BP: (132-143)/(59-73) 132/59 mmHg (04/18 0702) SpO2:  [94 %-100 %] 100 % (04/18 0702) Last BM Date: 11/24/12  Intake/Output from previous day:   Intake/Output this shift:    General appearance: alert, cooperative and no distress Neurologic: Grossly normal  Lab Results:  No results found for this basename: WBC, HGB, HCT, PLT,  in the last 72 hours BMET No results found for this basename: NA, K, CL, CO2, GLUCOSE, BUN, CREATININE, CALCIUM,  in the last 72 hours PT/INR No results found for this basename: LABPROT, INR,  in the last 72 hours ABG No results found for this basename: PHART, PCO2, PO2, HCO3,  in the last 72 hours  Studies/Results: No results found.  Anti-infectives: Anti-infectives   Start     Dose/Rate Route Frequency Ordered Stop   11/23/12 1330  ciprofloxacin (CIPRO) tablet 500 mg     500 mg Oral 2 times daily 11/23/12 1216 11/28/12 0759      Assessment/Plan: s/p * No surgery found *  Discharge to inpatient rehab  LOS: 8 days    Angeleen Horney A 11/27/2012

## 2012-11-27 NOTE — Plan of Care (Signed)
Overall Plan of Care [facility]) Patient Details Name: [name] MRN: [id number] DOB: 02/01/1931  Diagnosis:  TBI  Co-morbidities: seizure disorder, uti, right rotator cuff injury, bipolar disorder  Functional Problem List  Patient demonstrates impairments in the following areas: Balance, Cognition, Endurance, Medication Management, Motor, Pain and Safety  Basic ADL's: grooming, bathing, dressing and toileting Advanced ADL's: simple meal preparation  Transfers:  bed mobility, bed to chair, toilet, tub/shower and car Locomotion:  ambulation  Additional Impairments:  Functional use of upper extremity, Swallowing, Social Cognition   social interaction, problem solving, memory, attention and awareness and Discharge Disposition  Anticipated Outcomes Item Anticipated Outcome  Eating/Swallowing  I  Basic self-care  Modified I  Tolieting  Modified I  Bowel/Bladder  Continent of bowel and bladder  Transfers  Modified I for toilet, s for shower Mod-I for car and basic transfers  Locomotion  Using LRAD x 300' community Mod-I  Communication    Cognition  Min vc's for sustained attention during functional task  Pain  </= 2  Safety/Judgment  No falls with injury  Other     Therapy Plan: PT Intensity: Minimum of 1-2 x/day ,45 to 90 minutes PT Frequency: 5 out of 7 days PT Duration Estimated Length of Stay: 10-14 days OT Frequency: 5 out of 7 days OT Duration/Estimated Length of Stay: 7-10 days SLP Intensity: Minumum of 1-2 x/day, 30 to 90 minutes SLP Frequency: 5 out of 7 days SLP Duration/Estimated Length of Stay: 7-10 days    Team Interventions: Item RN PT OT SLP SW TR Other  Self Care/Advanced ADL Retraining   x      Neuromuscular Re-Education  x x      Therapeutic Activities  x x x     UE/LE Strength Training/ROM  x x      UE/LE Coordination Activities  x       Visual/Perceptual Remediation/Compensation         DME/Adaptive Equipment Instruction  x x       Therapeutic Exercise  x x      Balance/Vestibular Training  x x      Patient/Family Education  x x x     Cognitive Remediation/Compensation   x x     Functional Mobility Training  x x      Ambulation/Gait Training  x       Garment/textile technologist Reintegration   x      Dysphagia/Aspiration Printmaker    x     Speech/Language Facilitation         Bladder Management x        Bowel Management x        Disease Management/Prevention x        Pain Management x        Medication Management x   x     Skin Care/Wound Management x        Splinting/Orthotics         Discharge Planning x  x      Psychosocial Support x  x                             Team Discharge Planning: Destination: PT-Home ,OT- Home , SLP-Home Projected Follow-up: PT-Home health PT, OT-  Home health OT,  SLP-24 hour supervision/assistance Projected Equipment Needs: PT- , OT-  (TBD), SLP-  Patient/family involved in discharge planning: PT- Patient,  OT-Patient, SLP-Patient  MD ELOS: 10 days Medical Rehab Prognosis:  Excellent Assessment: The patient has been admitted for CIR therapies. The team will be addressing, functional mobility, strength, stamina, balance, safety, adaptive techniques/equipment, self-care, bowel and bladder mgt, patient and caregiver education, NMR, cognitive remediation. Goals have been set at mod I. Pt needs to be realistic and use better safety judgement as a whole with her mobility/acitivity moving forward. She is quite motivated to improve but can border on impulsive.   Ranelle Oyster, MD, FAAPMR      See Team Conference Notes for weekly updates to the plan of care
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
## 2012-11-27 NOTE — Progress Notes (Signed)
Rehab admissions - Evaluated for possible admission.  I spoke with patient and her neighbor, Steward Drone.  Patient would like inpatient rehab prior to home.  Bed available and can admit to acute inpatient rehab today.  Call me for questions.  #[phone]

## 2012-11-27 NOTE — Clinical Documentation Improvement (Signed)
RESPIRATORY FAILURE DOCUMENTATION CLARIFICATION QUERY   THIS DOCUMENT IS NOT A PERMANENT PART OF THE MEDICAL RECORD  TO RESPOND TO THE THIS QUERY, FOLLOW THE INSTRUCTIONS BELOW:  1. If needed, update documentation for the patient's encounter via the notes activity.  2. Access this query again and click edit on the In [employer].  3. After updating, or not, click F2 to complete all highlighted (required) fields concerning your review. Select "additional documentation in the medical record" OR "no additional documentation provided".  4. Click Sign note button.  5. The deficiency will fall out of your In Basket *Please let us know if you are not able to complete this workflow by phone or e-mail (listed below).  Please update your documentation within the medical record to reflect your response to this query.                                                                                    11/27/12  Dear Dr. Magnus Ivan Marton Redwood,  In a better effort to capture your patient's severity of illness, reflect appropriate length of stay and utilization of resources, a review of the patient medical record has revealed the following indicators.    Based on your clinical judgment, please clarify and document in a progress note and/or discharge summary the clinical condition associated with the following supporting information:  In responding to this query please exercise your independent judgment.  The fact that a query is asked, does not imply that any particular answer is desired or expected.  Possible Clinical Conditions?  ______x_Acute Respiratory Failure _______Acute on Chronic Respiratory Failure _______Other Condition________________ _______Cannot Clinically Determine    Supporting Information:  Risk Factors: Seizure   Signs&Symptoms:  (as per notes) "Seizure in ED requiring emergent intubation."  "On our arrival she was unresponsive and being bagged. She was intubated by the  emergency department physician."  Respiratory Treatment: Pt placed on ventilator in ED.                    You may use possible, probable, or suspect with inpatient documentation. possible, probable, suspected diagnoses MUST be documented at the time of discharge  Thank You,  Carmon Ginsberg, RN, BSN Clinical Documentation Specialist: Office # [phone]  Health Information Management [place]

## 2012-11-27 NOTE — Progress Notes (Signed)
Response to CDI inquiry: patient had acute ventilator dependent respiratory failure on admission 11/19/12. [name], MD, MPH, FACS Pager: [phone]
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
## 2012-11-27 NOTE — Interval H&P Note (Signed)
Loretto Belinsky was admitted today to Inpatient Rehabilitation with the diagnosis of mild to moderate TBI after a fall..  The patient's history has been reviewed, patient examined, and there is no change in status.  Patient continues to be appropriate for intensive inpatient rehabilitation.  I have reviewed the patient's chart and labs.  Questions were answered to the patient's satisfaction.  SWARTZ,ZACHARY T 11/27/2012, 10:36 PM

## 2012-11-28 ENCOUNTER — Inpatient Hospital Stay (HOSPITAL_COMMUNITY): Payer: Medicare Other | Admitting: Occupational Therapy

## 2012-11-28 ENCOUNTER — Inpatient Hospital Stay (HOSPITAL_COMMUNITY): Payer: Medicare Other | Admitting: Speech Pathology

## 2012-11-28 ENCOUNTER — Inpatient Hospital Stay (HOSPITAL_COMMUNITY): Payer: Medicare Other | Admitting: Physical Therapy

## 2012-11-28 DIAGNOSIS — S069X9A Unspecified intracranial injury with loss of consciousness of unspecified duration, initial encounter: Secondary | ICD-10-CM

## 2012-11-28 DIAGNOSIS — W240XXA Contact with lifting devices, not elsewhere classified, initial encounter: Secondary | ICD-10-CM

## 2012-11-28 DIAGNOSIS — R569 Unspecified convulsions: Secondary | ICD-10-CM

## 2012-11-28 NOTE — Progress Notes (Signed)
Subjective/Complaints: In good spirits. Encouraged by her perceived progress. Ready to get started with therapies this am. A 12 point review of systems has been performed and if not noted above is otherwise negative.   Objective: Vital Signs: Blood pressure 129/73, pulse 51, temperature 98 F (36.7 C), temperature source Oral, resp. rate 18, height 5\' 4"  (1.626 m), weight 105.053 kg (231 lb 9.6 oz), SpO2 95.00%. Dg Shoulder Right  11/27/2012  *RADIOLOGY REPORT*  Clinical Data: Right shoulder pain  RIGHT SHOULDER - 2+ VIEW  Comparison: None.  Findings: No fracture or dislocation is seen.  Degenerative changes of the glenohumeral and acromioclavicular joints.  Visualized right lung is notable for increased interstitial markings, likely chronic.  IMPRESSION: No fracture or dislocation is seen.  Degenerative changes.   Original Report Authenticated By: Charline Bills, M.D.    No results found for this basename: WBC, HGB, HCT, PLT,  in the last 72 hours No results found for this basename: NA, K, CL, CO, GLUCOSE, BUN, CREATININE, CALCIUM,  in the last 72 hours CBG (last 3)  No results found for this basename: GLUCAP,  in the last 72 hours  Wt Readings from Last 3 Encounters:  11/27/12 105.053 kg (231 lb 9.6 oz)  11/19/12 103.4 kg (227 lb 15.3 oz)  09/11/12 104.327 kg (230 lb)    Physical Exam:  Constitutional: She is oriented to person, place, and time. She appears well-developed and well-nourished.  HENT:  Head: Normocephalic and atraumatic.  Eyes: Pupils are equal, round, and reactive to light.  Neck: Normal range of motion.  Cardiovascular: Normal rate and regular rhythm.  Pulmonary/Chest: Effort normal and breath sounds normal.  Abdominal: Soft. Bowel sounds are normal.  Neurological: She is alert and oriented to person, place, and time.  Speech clear. Able to follow simple commands without difficulty. Not as distracted today but needs redirections occasionally due to perseveration  on certain subjects. A bit impulsive. Impaired insight with poor awareness. ("Ill be ready to go home in 2 days") Right upper extremity movement limited mostly due to pain. Otherwise strength grossly 3 to 4/5 in all limbs. No gross sensory findings.       Assessment/Plan: 1. Functional deficits secondary to moderate/mild TBI after fall which require 3+ hours per day of interdisciplinary therapy in a comprehensive inpatient rehab setting. Physiatrist is providing close team supervision and 24 hour management of active medical problems listed below. Physiatrist and rehab team continue to assess barriers to discharge/monitor patient progress toward functional and medical goals. FIM:                   Comprehension Comprehension Mode: Auditory Comprehension: 6-Follows complex conversation/direction: With extra time/assistive device  Expression Expression Mode: Verbal Expression: 6-Expresses complex ideas: With extra time/assistive device  Social Interaction Social Interaction: 6-Interacts appropriately with others with medication or extra time (anti-anxiety, antidepressant).  Problem Solving Problem Solving: 5-Solves complex 90% of the time/cues < 10% of the time  Memory Memory: 6-More than reasonable amt of time  Medical Problem List and Plan:  1. DVT Prophylaxis/Anticoagulation: Pharmaceutical: Lovenox  2. Low back pain and OA: uses ibuprofen 800 mg/hs and currently stiff due to immobility and lack of NSAIDS. SE concerns reviewed. Will start celebrex. Will add Voltaren and and gel to help with pain right thumb.  3. Bipolar disorder: Continue Prozac. On adderall for activation/energy levels/attention. Mood reported to be labile at times. Will monitor and provide ego support. LCSW to follow for evaluation. Neuro psych evaluation for  mood/compentancy.  4. Neuropsych: This patient is is not capable of making decisions on his/her own behalf.  5. Seizures: Seizure free on current  dose of Depakote.. Recheck levels 04/21.  6. Proteus UTI: Completes cipro completed.  7. HTN: Monitor with bid checks. Off hyzaar at current time. Monitor for trends as activity increases.  8. RTC pathology: XR's without any acute findings. Conservative approach for RTC pain and to improve functional use of shoulder  9. Hypokalemia: likely dilutional effects. Elevated at admission likely due to ace. Will recheck on 0421.  10. Peripheral edema:  low salt restrictions with TEDs. Resume Diuretics as BP improves. Check orthostatic BP.     LOS (Days) 1 A FACE TO FACE EVALUATION WAS PERFORMED  SWARTZ,ZACHARY T 11/28/2012 8:42 AM
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
## 2012-11-29 ENCOUNTER — Inpatient Hospital Stay (HOSPITAL_COMMUNITY): Payer: Medicare Other | Admitting: Physical Therapy

## 2012-11-29 NOTE — Progress Notes (Signed)
Subjective/Complaints: No complaints. Slept well. Wants to use walker on her own.. A 12 point review of systems has been performed and if not noted above is otherwise negative.   Objective: Vital Signs: Blood pressure 143/83, pulse 70, temperature 97.6 F (36.4 C), temperature source Oral, resp. rate 20, height 5\' 4"  (1.626 m), weight 105.053 kg (231 lb 9.6 oz), SpO2 97.00%. Dg Shoulder Right  11/27/2012  *RADIOLOGY REPORT*  Clinical Data: Right shoulder pain  RIGHT SHOULDER - 2+ VIEW  Comparison: None.  Findings: No fracture or dislocation is seen.  Degenerative changes of the glenohumeral and acromioclavicular joints.  Visualized right lung is notable for increased interstitial markings, likely chronic.  IMPRESSION: No fracture or dislocation is seen.  Degenerative changes.   Original Report Authenticated By: Charline Bills, M.D.    No results found for this basename: WBC, HGB, HCT, PLT,  in the last 72 hours No results found for this basename: NA, K, CL, CO, GLUCOSE, BUN, CREATININE, CALCIUM,  in the last 72 hours CBG (last 3)  No results found for this basename: GLUCAP,  in the last 72 hours  Wt Readings from Last 3 Encounters:  11/27/12 105.053 kg (231 lb 9.6 oz)  11/19/12 103.4 kg (227 lb 15.3 oz)  09/11/12 104.327 kg (230 lb)    Physical Exam:  Constitutional: She is oriented to person, place, and time. She appears well-developed and well-nourished.  HENT:  Head: Normocephalic and atraumatic.  Eyes: Pupils are equal, round, and reactive to light.  Neck: Normal range of motion.  Cardiovascular: Normal rate and regular rhythm.  Pulmonary/Chest: Effort normal and breath sounds normal.  Abdominal: Soft. Bowel sounds are normal.  Neurological: She is alert and oriented to person, place, and time.  Speech clear. Able to follow simple commands without difficulty. Still a bit impulsive. Impaired insight with poor awareness.   Right upper extremity movement limited mostly due to  pain. (as a whole may be a half-grade weaker than the left). Otherwise strength grossly 3+ to 4/5 in all limbs. No gross sensory findings.  Psych: Mood is pleasant      Assessment/Plan: 1. Functional deficits secondary to moderate/mild TBI after fall which require 3+ hours per day of interdisciplinary therapy in a comprehensive inpatient rehab setting. Physiatrist is providing close team supervision and 24 hour management of active medical problems listed below. Physiatrist and rehab team continue to assess barriers to discharge/monitor patient progress toward functional and medical goals.  Reviewed safety with patient and need to follow our direction!  FIM: FIM - Bathing Bathing Steps Patient Completed: Chest;Left Arm;Abdomen;Right Arm;Front perineal area;Right upper leg;Left upper leg;Right lower leg (including foot);Left lower leg (including foot) (pt with pain/decreased ROM in RUE ) Bathing: 4: Min-Patient completes 8-9 44f 10 parts or 75+ percent  FIM - Upper Body Dressing/Undressing Upper body dressing/undressing steps patient completed: Thread/unthread right sleeve of pullover shirt/dresss;Thread/unthread left sleeve of pullover shirt/dress;Put head through opening of pull over shirt/dress;Pull shirt over trunk (pt does not wear bra) Upper body dressing/undressing: 5: Supervision: Safety issues/verbal cues (cues for sustained attention) FIM - Lower Body Dressing/Undressing Lower body dressing/undressing steps patient completed: Thread/unthread right underwear leg;Thread/unthread left underwear leg;Pull underwear up/down;Thread/unthread right pants leg;Thread/unthread left pants leg;Pull pants up/down;Don/Doff left shoe;Fasten/unfasten right shoe;Fasten/unfasten left shoe (assist with right shoe due to swelling in right foot. TEDS) Lower body dressing/undressing: 4: Min-Patient completed 75 plus % of tasks  FIM - Toileting Toileting steps completed by patient: Adjust clothing prior to  toileting;Performs perineal hygiene;Adjust clothing  after toileting Toileting Assistive Devices: Grab bar or rail for support Toileting: 4: Steadying assist  FIM - Diplomatic Services operational officer Devices: Grab bars Toilet Transfers: 4-To toilet/BSC: Min A (steadying Pt. > 75%);4-From toilet/BSC: Min A (steadying Pt. > 75%)  FIM - Bed/Chair Transfer Bed/Chair Transfer Assistive Devices: Bed rails;Arm rests Bed/Chair Transfer: 4: Bed > Chair or W/C: Min A (steadying Pt. > 75%);4: Chair or W/C > Bed: Min A (steadying Pt. > 75%)  FIM - Locomotion: Ambulation Locomotion: Ambulation Assistive Devices: Walker - Rolling Ambulation/Gait Assistance: 4: Min guard Locomotion: Ambulation: 2: Travels 50 - 149 ft with minimal assistance (Pt.>75%)  Comprehension Comprehension Mode: Auditory Comprehension: 5-Understands basic 90% of the time/requires cueing < 10% of the time  Expression Expression Mode: Verbal Expression: 5-Expresses basic needs/ideas: With no assist  Social Interaction Social Interaction: 6-Interacts appropriately with others with medication or extra time (anti-anxiety, antidepressant).  Problem Solving Problem Solving: 5-Solves basic 90% of the time/requires cueing < 10% of the time  Memory Memory: 4-Recognizes or recalls 75 - 89% of the time/requires cueing 10 - 24% of the time  Medical Problem List and Plan:  1. DVT Prophylaxis/Anticoagulation: Pharmaceutical: Lovenox  2. Low back pain and OA: uses ibuprofen 800 mg/hs and currently stiff due to immobility and lack of NSAIDS. SE concerns reviewed. Will start celebrex. Will add Voltaren and and gel to help with pain right thumb.  3. Bipolar disorder: Continue Prozac. On adderall for activation/energy levels/attention. Mood reported to be labile at times. Will monitor and provide ego support. LCSW to follow for evaluation. Neuro psych evaluation for mood/compentancy.  4. Neuropsych: This patient is is not capable of  making decisions on his/her own behalf.  5. Seizures: Seizure free on current dose of Depakote.. Recheck levels 04/21.  6. Proteus UTI: Completes cipro completed.  7. HTN: Monitor with bid checks. Off hyzaar at current time. Monitor for trends as activity increases.  8. RTC pathology: XR's without any acute findings. Conservative approach for RTC pain and to improve functional use of shoulder  9. Hypokalemia: likely dilutional effects. Elevated at admission likely due to ace. Will recheck on 0421.  10. Peripheral edema:  low salt restrictions with TEDs. Resume Diuretics as BP improves. Check orthostatic BP.     LOS (Days) 2 A FACE TO FACE EVALUATION WAS PERFORMED  SWARTZ,ZACHARY T 11/29/2012 8:05 AM

## 2012-11-29 NOTE — Progress Notes (Signed)
Physical Therapy Note  Patient Details  Name: Tiaunna Buford MRN: [id number] Date of Birth: October 02, 1930 Today's Date: 11/29/2012  Time: 1320-1403 43 minutes  No c/o pain.  Pt found in w/c, pt mod I with w/c around unit with B LEs.  Gait training with RW with pt noted to have scissoring gait, trendelenberg on L, requires min manual facilitation at hips for hip control.  Pt able to gait 30' then begins to have tremors in extremities and head, able to stand and rest but continues to tremor through gait another 50'.  Pt reports she had tremors after previous injury years ago.  Gait training with focus on decreasing scissoring with min A and min-mod visual and verbal cues.  Pt continues with tremors after 30-35' but able to continue walking through tremors.  Standing NMR for B hip strengthening, stepping in all directions and mini squats with min facilitation.  Pt with decreased sustained attention ,requires cues to stay on task.  Individual therapy   Greogry Goodwyn 11/29/2012, 2:06 PM

## 2012-11-30 ENCOUNTER — Inpatient Hospital Stay (HOSPITAL_COMMUNITY): Payer: Medicare Other | Admitting: Physical Therapy

## 2012-11-30 ENCOUNTER — Encounter (HOSPITAL_COMMUNITY): Payer: Medicare Other

## 2012-11-30 ENCOUNTER — Inpatient Hospital Stay (HOSPITAL_COMMUNITY): Payer: Medicare Other

## 2012-11-30 ENCOUNTER — Inpatient Hospital Stay (HOSPITAL_COMMUNITY): Payer: Medicare Other | Admitting: *Deleted

## 2012-11-30 ENCOUNTER — Inpatient Hospital Stay (HOSPITAL_COMMUNITY): Payer: Medicare Other | Admitting: Speech Pathology

## 2012-11-30 DIAGNOSIS — R569 Unspecified convulsions: Secondary | ICD-10-CM

## 2012-11-30 DIAGNOSIS — S069X9A Unspecified intracranial injury with loss of consciousness of unspecified duration, initial encounter: Secondary | ICD-10-CM

## 2012-11-30 DIAGNOSIS — W240XXA Contact with lifting devices, not elsewhere classified, initial encounter: Secondary | ICD-10-CM

## 2012-11-30 LAB — CBC WITH DIFFERENTIAL/PLATELET
Basophils Absolute: 0.1 10*3/uL (ref 0.0–0.1)
HCT: 35.2 % — ABNORMAL LOW (ref 36.0–46.0)
Lymphocytes Relative: 24 % (ref 12–46)
Lymphs Abs: 1.4 10*3/uL (ref 0.7–4.0)
Neutro Abs: 3.3 10*3/uL (ref 1.7–7.7)
Platelets: 334 10*3/uL (ref 150–400)
RBC: 4.05 MIL/uL (ref 3.87–5.11)
RDW: 13.6 % (ref 11.5–15.5)
WBC: 6 10*3/uL (ref 4.0–10.5)

## 2012-11-30 LAB — COMPREHENSIVE METABOLIC PANEL
Albumin: 2.8 g/dL — ABNORMAL LOW (ref 3.5–5.2)
BUN: 14 mg/dL (ref 6–23)
Calcium: 9.2 mg/dL (ref 8.4–10.5)
Chloride: 101 mEq/L (ref 96–112)
Creatinine, Ser: 0.53 mg/dL (ref 0.50–1.10)
Total Bilirubin: 0.3 mg/dL (ref 0.3–1.2)

## 2012-11-30 LAB — VALPROIC ACID LEVEL: Valproic Acid Lvl: 47.4 ug/mL — ABNORMAL LOW (ref 50.0–100.0)

## 2012-11-30 MED ORDER — VALACYCLOVIR HCL 500 MG PO TABS
500.0000 mg | ORAL_TABLET | Freq: Two times a day (BID) | ORAL | Status: AC
  Administered 2012-11-30 – 2012-12-02 (×6): 500 mg via ORAL
  Filled 2012-11-30 (×7): qty 1

## 2012-11-30 NOTE — Progress Notes (Signed)
Physical Therapy Note  Patient Details  Name: [name] MRN: [id number] Date of Birth: 03/09/1931 Today's Date: 11/30/2012  Time: 1400-1428 28 minutes  No c/o pain.  Gait training with household gait and obstacle negotiation with close supervision- min A.  Pt requires cues for safety with SW as she tends to lift walker when going around turns and forget walker in tight spaces. Pt requires min A for LOB.  Standing balance training with tap ups, stepping all directions with min A, tactile cues for glute facilitation.  Pt continues with B hip weakness and delayed balance reactions as well as decreased emergent awareness.  Individual therapy   Rylea Selway 11/30/2012, 2:29 PM
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
## 2012-11-30 NOTE — Progress Notes (Signed)
[facility] Individual Statement of Services  Patient Name:  [name]  Date:  11/30/2012  Welcome to the [facility].  Our goal is to provide you with an individualized program based on your diagnosis and situation, designed to meet your specific needs.  With this comprehensive rehabilitation program, you will be expected to participate in at least 3 hours of rehabilitation therapies Monday-Friday, with modified therapy programming on the weekends.  Your rehabilitation program will include the following services:  Physical Therapy (PT), Occupational Therapy (OT), Speech Therapy (ST), 24 hour per day rehabilitation nursing, Therapeutic Recreaction (TR), Neuropsychology, Case Management (Social Worker), Rehabilitation Medicine, Nutrition Services and Pharmacy Services  Weekly team conferences will be held on Tuesdays to discuss your progress.  Your Social Worker will talk with you frequently to get your input and to update you on team discussions.  Team conferences with you and your family in attendance may also be held.  Expected length of stay: 7-10 days  Overall anticipated outcome: modified independent  Depending on your progress and recovery, your program may change. Your Social Worker will coordinate services and will keep you informed of any changes. Your Social Worker's name and contact numbers are listed  below.  The following services may also be recommended but are not provided by the [facility]:   Driving Evaluations  Home Health Rehabiltiation Services  Outpatient Rehabilitatation Servives    Arrangements will be made to provide these services after discharge if needed.  Arrangements include referral to agencies that provide these services.  Your insurance has been verified to be:  Medicare and Medicaid Your primary doctor is:  Dr. Leveda Anna  Pertinent information will be shared with your doctor and your insurance  company.  Social Worker:  [place], [place] [phone] or ([phone]  Information discussed with and copy given to patient by: Amada Jupiter, 11/30/2012, 5:15 PM

## 2012-11-30 NOTE — Progress Notes (Signed)
Physical Therapy Session Note  Patient Details  Name: [name] MRN: [id number] Date of Birth: 31-Mar-1931  Today's Date: 11/30/2012 Time: 0930-1030 Time Calculation (min): 60 min  Skilled Therapeutic Interventions/Progress Updates:   Therapist changed out w/c for improved comfort and positioning for pt. Gait into bathroom with SW with min A, Toilet transfers and standing balance at sink for hygiene with overall min A; cues for safe positioning of SW. Dynamic gait through obstacle course to simulate home environment with overall steady A and cues needed for safe use of SW and posture. Stair training for functional strengthening using bilateral rails; cues needed for which foot to lead with and overall min A. Simulated car transfer with close S using SW in preparation for d/c.   Therapy Documentation Precautions:  Precautions Precautions: Fall Precaution Comments: Patient reports a slow spin while sitting still Required Braces or Orthoses:  (soft collar that patient requested for comfort) Cervical Brace: Soft collar Restrictions Weight Bearing Restrictions: No Pain: Pain Assessment Pain Assessment: No/denies pain   Locomotion : Ambulation Ambulation/Gait Assistance: 4: Min assist   See FIM for current functional status  Therapy/Group: Individual Therapy  Karolee Stamps [facility] 11/30/2012, 10:50 AM

## 2012-11-30 NOTE — Progress Notes (Addendum)
Subjective/Complaints: Had a good night. Denies pain. Feels she's getting stronger on the right side. . A 12 point review of systems has been performed and if not noted above is otherwise negative.   Objective: Vital Signs: Blood pressure 147/75, pulse 58, temperature 97.8 F (36.6 C), temperature source Oral, resp. rate 18, height 5\' 4"  (1.626 m), weight 105.053 kg (231 lb 9.6 oz), SpO2 98.00%. No results found.  Recent Labs  11/30/12 0727  WBC 6.0  HGB 12.2  HCT 35.2*  PLT 334    Recent Labs  11/30/12 0727  NA 137  K 4.6  CL 101  GLUCOSE 82  BUN 14  CREATININE 0.53  CALCIUM 9.2   CBG (last 3)  No results found for this basename: GLUCAP,  in the last 72 hours  Wt Readings from Last 3 Encounters:  11/27/12 105.053 kg (231 lb 9.6 oz)  11/19/12 103.4 kg (227 lb 15.3 oz)  09/11/12 104.327 kg (230 lb)    Physical Exam:  Constitutional: She is oriented to person, place, and time. She appears well-developed and well-nourished.  HENT:  Head: Normocephalic and atraumatic.  Eyes: Pupils are equal, round, and reactive to light.  Neck: Normal range of motion.  Cardiovascular: Normal rate and regular rhythm.  Pulmonary/Chest: Effort normal and breath sounds normal.  Abdominal: Soft. Bowel sounds are normal.  Neurological: She is alert and oriented to person, place, and time.  Speech clear. Able to follow simple commands without difficulty. Still a bit impulsive. I    Right upper extremity movement limited mostly due to pain. (as a whole may be a half-grade weaker than the left). Otherwise strength grossly 3+ to 4/5 in all limbs. No gross sensory findings.  Psych: Mood is pleasant Skin: small, cluster papular lesions on left buttock, near gluteal fold      Assessment/Plan: 1. Functional deficits secondary to moderate/mild TBI after fall which require 3+ hours per day of interdisciplinary therapy in a comprehensive inpatient rehab setting. Physiatrist is providing close  team supervision and 24 hour management of active medical problems listed below. Physiatrist and rehab team continue to assess barriers to discharge/monitor patient progress toward functional and medical goals.    FIM: FIM - Bathing Bathing Steps Patient Completed: Chest;Left Arm;Abdomen;Right Arm;Front perineal area;Right upper leg;Left upper leg;Right lower leg (including foot);Left lower leg (including foot) (pt with pain/decreased ROM in RUE ) Bathing: 4: Min-Patient completes 8-9 59f 10 parts or 75+ percent  FIM - Upper Body Dressing/Undressing Upper body dressing/undressing steps patient completed: Thread/unthread right sleeve of pullover shirt/dresss;Thread/unthread left sleeve of pullover shirt/dress;Put head through opening of pull over shirt/dress;Pull shirt over trunk (pt does not wear bra) Upper body dressing/undressing: 5: Supervision: Safety issues/verbal cues (cues for sustained attention) FIM - Lower Body Dressing/Undressing Lower body dressing/undressing steps patient completed: Thread/unthread right underwear leg;Thread/unthread left underwear leg;Pull underwear up/down;Thread/unthread right pants leg;Thread/unthread left pants leg;Pull pants up/down;Don/Doff left shoe;Fasten/unfasten right shoe;Fasten/unfasten left shoe (assist with right shoe due to swelling in right foot. TEDS) Lower body dressing/undressing: 4: Min-Patient completed 75 plus % of tasks  FIM - Toileting Toileting steps completed by patient: Adjust clothing prior to toileting;Performs perineal hygiene;Adjust clothing after toileting Toileting Assistive Devices: Grab bar or rail for support Toileting: 4: Steadying assist  FIM - Diplomatic Services operational officer Devices: Grab bars Toilet Transfers: 4-To toilet/BSC: Min A (steadying Pt. > 75%);4-From toilet/BSC: Min A (steadying Pt. > 75%)  FIM - Bed/Chair Transfer Bed/Chair Transfer Assistive Devices: Bed rails;Arm rests Bed/Chair Transfer:  4: Bed  > Chair or W/C: Min A (steadying Pt. > 75%);4: Chair or W/C > Bed: Min A (steadying Pt. > 75%)  FIM - Locomotion: Ambulation Locomotion: Ambulation Assistive Devices: Walker - Rolling Ambulation/Gait Assistance: 4: Min guard Locomotion: Ambulation: 2: Travels 50 - 149 ft with minimal assistance (Pt.>75%)  Comprehension Comprehension Mode: Auditory Comprehension: 5-Understands basic 90% of the time/requires cueing < 10% of the time  Expression Expression Mode: Verbal Expression: 5-Expresses basic needs/ideas: With no assist  Social Interaction Social Interaction: 6-Interacts appropriately with others with medication or extra time (anti-anxiety, antidepressant).  Problem Solving Problem Solving: 5-Solves basic 90% of the time/requires cueing < 10% of the time  Memory Memory: 4-Recognizes or recalls 75 - 89% of the time/requires cueing 10 - 24% of the time  Medical Problem List and Plan:  1. DVT Prophylaxis/Anticoagulation: Pharmaceutical: Lovenox  2. Low back pain and OA: uses ibuprofen 800 mg/hs and currently stiff due to immobility and lack of NSAIDS. SE concerns reviewed. Will start celebrex. Will add Voltaren and and gel to help with pain right thumb.  3. Bipolar disorder: Continue Prozac. On adderall for activation/energy levels/attention. Mood reported to be labile at times. Will monitor and provide ego support. LCSW to follow for evaluation. Neuro psych evaluation for mood/compentancy.  4. Neuropsych: This patient is is not capable of making decisions on his/her own behalf.  5. Seizures: Seizure free on current dose of Depakote.. Recheck levels 04/21.  6. Proteus UTI: Completes cipro completed.  7. HTN: Monitor with bid checks. Off hyzaar at current time. Monitor for trends as activity increases.  8. RTC pathology: XR's without any acute findings. Conservative approach for RTC pain and to improve functional use of shoulder  9. Hypokalemia: likely dilutional effects. Elevated at  admission likely due to ace. Will recheck on 0421.  10. Peripheral edema:  low salt restrictions with TEDs. Resume Diuretics as BP improves. Check orthostatic BP.  11. HSV--gluteal area- valtrex 500mg  bid x 3 days.  -may have had before  -believe we can dc contact precautions.    LOS (Days) 3 A FACE TO FACE EVALUATION WAS PERFORMED  Merle Cirelli T 11/30/2012 8:53 AM

## 2012-11-30 NOTE — Progress Notes (Signed)
Speech Language Pathology Daily Session Note  Patient Details  Name: [name] MRN: [id number] Date of Birth: [birth year]  Today's Date: 11/30/2012 Time: 1130-1200 Time Calculation (min): 30 min  Short Term Goals: Week 1: SLP Short Term Goal 1 (Week 1): Pt will indpendently consume a regular consistency diet with thin liquids without overt s/sx of aspiration. SLP Short Term Goal 2 (Week 1): Pt will independently demonstrate intellectual awareness of her cognitive-linguistic deficits. SLP Short Term Goal 3 (Week 1): Pt will exhibit sustained attention to functional task for 10 minutes with Min cues from SLP. SLP Short Term Goal 4 (Week 1): Pt will demonstrate adequate medication management with recall of current medication list with Min cues from SLP.  Skilled Therapeutic Interventions: Treatment focus on cognitive goals. Pt independently demonstrated intellectual awareness of current cognitive and physical deficits and was overall Mod I for recall of previous therapy and nursing events. Pt required intermittent verbal cues for redirection to task due to verbosity and tangential language, however, suspect this is pt's baseline.  Pt also consumed thin liquids during pill administration without overt s/s of aspiration.   FIM:  Comprehension Comprehension: 5-Follows basic conversation/direction: With extra time/assistive device Expression Expression Mode: Verbal Expression: 5-Expresses basic needs/ideas: With extra time/assistive device Social Interaction Social Interaction: 6-Interacts appropriately with others with medication or extra time (anti-anxiety, antidepressant). Problem Solving Problem Solving: 5-Solves basic problems: With no assist Memory Memory: 5-Recognizes or recalls 90% of the time/requires cueing < 10% of the time FIM - Eating Eating Activity: 7: Complete independence:no helper  Pain Pain Assessment Pain Assessment: No/denies pain  Therapy/Group: Individual  Therapy  Chayil Gantt 11/30/2012, 3:35 PM

## 2012-11-30 NOTE — Progress Notes (Signed)
Occupational Therapy Note  Patient Details  Name: Korbin Notaro MRN: [id number] Date of Birth: 04-17-1931 Today's Date: 11/30/2012  Pain:  None Individual session Time:  1030-1130  (60 min)  1st session  1st session:  Pt sitting in wc upon OT arrival.  Pt. Completed coffee.  Addressed functional mobility, endurance, balance in standing and sitting.  Unale to reach footies to doff.   Ambulated with min assist.  Stood with supervision while showering.  Pt. Able to wash feet and peri area.    Pt. Ambulated out to lavatory to apply lotion to body.  Pt. Donned smock in standing.   2nd session:  1445-1525  ( );  Individual session;  Pain- 2/10 EngagEngaged in strengthening, transfers, balance, functional mobility. Pt. Complained of the swelling in her right ankle.  Suggested to sit in recliner and elevate legs, but pt refused.   Performed AROM to both UE in sitting.   Ambulated to bathroom with RW and supervision.  Needed cues for upright posture.  Ambulated back to wc.       Humberto Seals 11/30/2012, 10:48 AM

## 2012-11-30 NOTE — Progress Notes (Signed)
Social visit.  I am Ms [name] PCP.  I am delighted to see her progress since my last visit when she was in the ICU.  She has the aim to return home to independent living.    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  I am pleased that she is happy and participating in her rehab.  I will be happy to provide primary care follow up when she is released from CIR.

## 2012-11-30 NOTE — Progress Notes (Signed)
Physical Therapy Session Note  Patient Details  Name: [name] MRN: [id number] Date of Birth: Jul 18, 1931  Today's Date: 11/30/2012 Time: 7829-5621 Time Calculation (min): 63 min  Short Term Goals: Week 1:     Skilled Therapeutic Interventions/Progress Updates:    Room negotiation with RW, supervision/min assist. Pt reporting she does not like RW and after 30' ambulation in hallway pt refused to use RW because she doesn't like the wheels and reports she would like to use a standard walker. Attempted propulsion of wheelchair, pt reports she can not propel wheelchair with UEs due to arthritis, able to propel with bil LEs x 30' before fatigue.   Ambulation x 41' with standard walker and min-guard assist, pt continues to adduct Lt. LE and demonstrate trendelenburg during stance on Lt. Pt provided verbal cues and facilitation for improved pelvic stability and Lt. LE abduction. Attempted step ups for Lt. LE strengthening and pelvic stability, pt performed one then declined further due to hand and wrist discomfort from railings. Ambulation x 87' with only HHA or wall railing (+ min assist) for challenge of balance with gait. Only a few instances of tremors however pt continues to ambulate through them.   Decreased sustained attention and poor short term memory during session. Activities during session sought to address deficits.   Therapy Documentation Precautions:  Precautions Precautions: Fall Precaution Comments: Patient reports a slow spin while sitting still Required Braces or Orthoses:  (soft collar that patient requested for comfort) Cervical Brace: Soft collar Restrictions Weight Bearing Restrictions: No Pain: Pain Assessment Pain Assessment: No/denies pain  See FIM for current functional status  Therapy/Group: Individual Therapy  Wilhemina Bonito 11/30/2012, 8:59 AM

## 2012-12-01 ENCOUNTER — Inpatient Hospital Stay (HOSPITAL_COMMUNITY): Payer: Medicare Other | Admitting: Speech Pathology

## 2012-12-01 ENCOUNTER — Inpatient Hospital Stay (HOSPITAL_COMMUNITY): Payer: Medicare Other | Admitting: Occupational Therapy

## 2012-12-01 ENCOUNTER — Inpatient Hospital Stay (HOSPITAL_COMMUNITY): Payer: Medicare Other | Admitting: Physical Therapy

## 2012-12-01 ENCOUNTER — Inpatient Hospital Stay (HOSPITAL_COMMUNITY): Payer: Medicare Other

## 2012-12-01 DIAGNOSIS — R609 Edema, unspecified: Secondary | ICD-10-CM | POA: Diagnosis present

## 2012-12-01 DIAGNOSIS — R600 Localized edema: Secondary | ICD-10-CM | POA: Diagnosis present

## 2012-12-01 MED ORDER — HYDROCHLOROTHIAZIDE 25 MG PO TABS
25.0000 mg | ORAL_TABLET | Freq: Every day | ORAL | Status: DC
  Administered 2012-12-01 – 2012-12-02 (×2): 25 mg via ORAL
  Filled 2012-12-01 (×4): qty 1

## 2012-12-01 NOTE — Progress Notes (Signed)
Physical Therapy Note  Patient Details  Name: [name] MRN: [id number] Date of Birth: 28-Nov-1930 Today's Date: 12/01/2012  Time: 1330-1413 43 minutes  No c/o pain.  Gait with SW with distant supervision/mod I in controlled environment.  Supervision with SW in home environment for kitchen and bedroom mobility.  Furniture transfers to various surfaces with and without armrests with pt able to stand without assistance from a variety of surfaces.  Reviewed [place] [facility] for balance and LE strengthening, pt reports she would be willing to try an exercise program at home.  Individual therapy   Werner Labella 12/01/2012, 2:14 PM

## 2012-12-01 NOTE — Plan of Care (Signed)
Problem: RH SKIN INTEGRITY Goal: RH STG MAINTAIN SKIN INTEGRITY WITH ASSISTANCE STG Maintain Skin Integrity With Min Assistance.  Outcome: Not Progressing Patient has red area with bumps to left buttocks. Area around it is red and edematous

## 2012-12-01 NOTE — Consult Note (Signed)
11/30/12  NEUROCOGNITIVE TESTING - [facility]IDENTIAL [facility]   [name] is a 79 year old, right-handed, divorced Caucasian female, who denied experiencing any major cognitive difficulties following a head injury she recently sustained. However, she did endorse suffering from long-standing visual and/or visuospatial issues since undergoing brain surgery several years ago owing to a meningioma. For example, she said that she feels like things are always moving (e.g., words on the page while reading).   The patient was referred for neuropsychological consultation given the possibility of cognitive sequelae subsequent to the current medical status and in order to assist in treatment planning.   PROCEDURES: [3 units of 45409 on 11/30/12]  Diagnostic Interview Medical record review Behavioral observations  The following tests were performed during today's visit: Repeatable Battery for the Assessment of Neuropsychological Status (RBANS, form A). Test results are as follows:   Of note, certain tasks could not be administered due to physical limitations.   RBANS Indices Scaled Score Percentile Description  Immediate Memory  94 34 Average  Language 88 21 Below average    RBANS Subtests Raw Score Percentile Description  List Learning 22 18 Below average  Story Memory 19 66 Average  Figure Copy N/A N/A N/A  Line Orientation 12 5 Impaired  Picture Naming 10 70 Average  Semantic Fluency 13 9 Borderline impaired  Digit Span 9 27 Average  Coding N/A N/A N/A  List Recall 5 50 Average  List Recognition 19 42 Average  Story Recall 10 68 Average  Figure recall N/A N/A N/A   Cognitive Evaluation: Test results revealed intact performance in most cognitive domains and thinking skills assessed during this evaluation with the exception of mildly reduced unstructured verbal learning and semantic fluency, and impaired spatial judgment.   Emotional & Behavioral Evaluation: [name] was appropriately dressed for season and situation, and she appeared tidy and well-groomed. Normal posture was noted. She was friendly and rapport easily established. Her speech was somewhat tangential and she was mildly impulsive. She seemed to understand test directions readily. Her affect was appropriately modulated. Attention and motivation were good. Optimal test taking conditions were maintained.  From an emotional standpoint, [name] acknowledged having a longstanding history of treatment for depression and anxiety, but she described her current mood as "good." No major adjustment issues were noted. Suicidal/homicidal ideation, plan or intent was denied. No manic or hypomanic episodes were reported. The patient denied ever experiencing any auditory/visual hallucinations. No major behavioral or personality changes were endorsed.    Overall, despite having suffered a concussion and subsequent seizure, [name] does not seem to be experiencing a great deal of cognitive dysfunction stemming from that accident. No adjustment issues seem present either. Of note, the patient's observed issues with spatial judgment and reported past problems with left-sided neglect seem to originate from a previous brain surgery that was performed owing to a meningioma.   In light of these findings, the following recommendations are provided.    RECOMMENDATIONS  Recommendations for treatment team:    When interacting with [name], directions and information should be provided in a simple, straight forward manner, and the treatment team should avoid giving multiple instructions simultaneously.    She may also benefit from being provided with multiple trials to learn new skills given the noted memory inefficiencies. In addition, she will benefit from recognition cueing and an increase in structure and context (i.e., directions given in conversational format as opposed to a random list of instructions).  To the extent possible, multitasking should be avoided.   Be aware that she is suffering from significant visual spatial deficits. Taking this into consideration will help tailor therapy and keep accidents at bay as much as possible when she is trying to navigate her surroundings.    Performance will generally be best in a structured, routine, and familiar environment, as opposed to situations involving complex problems.   Recommendations for discharge planning:    Maintain engagement in mentally, physically and cognitively stimulating activities.    Strive to maintain a healthy lifestyle (e.g., proper diet and exercise) in order to promote physical, cognitive and emotional health.   REFERRING DIAGNOSES: Concussion Convulsions  FINAL DIAGNOSES:  Concussion Convulsions Bipolar disorder (by history)    [name], Psy.D.  Clinical Neuropsychologist

## 2012-12-01 NOTE — Progress Notes (Signed)
Occupational Therapy Session Note  Patient Details  Name: [name] MRN: [id number] Date of Birth: 09-08-1930  Today's Date: 12/01/2012 Time: 6213-0865 Time Calculation (min): 60 min  Short Term Goals: Week 1:  OT Short Term Goal 1 (Week 1): N/A secondary to SLOS - see LTG's  Skilled Therapeutic Interventions/Progress Updates:    1:1 self care retraining at sink level with focus on standing balance and right UE use for all grooming tasks, functional ambulation around room with RW with supervision. Pt required left handed A to assist with right hand to fix toothbrush.  Neuro muscular reeducation: with strengthening in normal patterns of movement; also issued tan theraputty for hand strengthening with handout for exercises. Then went through with donning makeup sitting in chair using bilateral UE.  Therapy Documentation Precautions:  Precautions Precautions: Fall Precaution Comments: Patient reports a slow spin while sitting still Required Braces or Orthoses:  (soft collar that patient requested for comfort) Cervical Brace: Soft collar Restrictions Weight Bearing Restrictions: No Pain: Pain Assessment Pain Assessment: No/denies pain  See FIM for current functional status  Therapy/Group: Individual Therapy  Roney Mans [facility] 12/01/2012, 9:15 AM

## 2012-12-01 NOTE — Progress Notes (Signed)
Occupational Therapy Session Note  Patient Details  Name: Marchella Hibbard MRN: [id number] Date of Birth: 1931/04/18  Today's Date: 12/01/2012 Time: 0704-0800 Time Calculation (min): 56 min  Short Term Goals: Week 1:  OT Short Term Goal 1 (Week 1): N/A secondary to SLOS - see LTG's  Skilled Therapeutic Interventions/Progress Updates:    Pt seated in w/c upon arrival.  Pt stated she didn't need to wash up this morning because she just took a shower the previous day.  Pt is able to retrieve items from cabinets under kitchen counter without assistance. Pt stood from w/c to don smock before transitioning to ADL apartment to practice walk-in shower transfers.  Pt engaged in functional amb with SW for home mgmt tasks in ADL apartment and kitchen.  Pt required steady A for simulated shower transfer.  Focus on activity tolerance, safety awareness, and dynamic standing balance.  Therapy Documentation Precautions:  Precautions Precautions: Fall Precaution Comments: Patient reports a slow spin while sitting still Required Braces or Orthoses:  (soft collar that patient requested for comfort) Cervical Brace: Soft collar Restrictions Weight Bearing Restrictions: No Pain: Pain Assessment Pain Assessment: No/denies pain  See FIM for current functional status  Therapy/Group: Individual Therapy  Rich Brave 12/01/2012, 8:04 AM

## 2012-12-01 NOTE — Progress Notes (Signed)
Patient O2 sat were above 96% throughout night. No complaints of problems with breathing and no signs of distress.

## 2012-12-01 NOTE — Progress Notes (Signed)
Speech Language Pathology Daily Session Note  Patient Details  Name: [name] MRN: [id number] Date of Birth: [birth year]  Today's Date: 12/01/2012 Time: 0915-1000 Time Calculation (min): 45 min  Short Term Goals: Week 1: SLP Short Term Goal 1 (Week 1): Pt will indpendently consume a regular consistency diet with thin liquids without overt s/sx of aspiration. SLP Short Term Goal 2 (Week 1): Pt will independently demonstrate intellectual awareness of her cognitive-linguistic deficits. SLP Short Term Goal 3 (Week 1): Pt will exhibit sustained attention to functional task for 10 minutes with Min cues from SLP. SLP Short Term Goal 4 (Week 1): Pt will demonstrate adequate medication management with recall of current medication list with Min cues from SLP.  Skilled Therapeutic Interventions: Treatment focus on cognitive goals. SLP facilitated session by providing supervision question cues for recall of current medications and their functions and for verbal problem solving in regards to organization with medication management.  Pt also demonstrated alternating attention between self-care tasks and functional conversation with Mod I.    FIM:  Comprehension Comprehension Mode: Auditory Comprehension: 6-Follows complex conversation/direction: With extra time/assistive device Expression Expression Mode: Verbal Expression: 6-Expresses complex ideas: With extra time/assistive device Social Interaction Social Interaction: 6-Interacts appropriately with others with medication or extra time (anti-anxiety, antidepressant). Problem Solving Problem Solving: 5-Solves complex 90% of the time/cues < 10% of the time Memory Memory: 5-Recognizes or recalls 90% of the time/requires cueing < 10% of the time  Pain Pain Assessment Pain Assessment: No/denies pain  Therapy/Group: Individual Therapy  Tkeya Stencil 12/01/2012, 4:03 PM

## 2012-12-01 NOTE — Progress Notes (Signed)
Physical Therapy Session Note  Patient Details  Name: [name] MRN: [id number] Date of Birth: 06/01/1931  Today's Date: 12/01/2012 Time:1000-1100 60 minutes  No c/o pain.  Skilled Therapeutic Interventions/Progress Updates:    Berg balance test performed.  Pt scored 32/56, high risk for falls.  Pt educated on fall risk and need for AD for gait and mobility at this time, pt expresses understanding.  Gait training with SW 2 x 150' with distant supervision.  Attempt gait with wide based quad cane, pt requires min steadying assist and occasional mod A for LOB, pt agrees with PT that gait is more safe with SW at this time.  Therapy Documentation Balance: Berg Balance Test Sit to Stand: Able to stand  independently using hands Standing Unsupported: Able to stand safely 2 minutes Sitting with Back Unsupported but Feet Supported on Floor or Stool: Able to sit safely and securely 2 minutes Stand to Sit: Sits safely with minimal use of hands Transfers: Able to transfer safely, definite need of hands Standing Unsupported with Eyes Closed: Able to stand 3 seconds Standing Ubsupported with Feet Together: Needs help to attain position but able to stand for 30 seconds with feet together From Standing, Reach Forward with Outstretched Arm: Can reach forward >12 cm safely (5") From Standing Position, Pick up Object from Floor: Able to pick up shoe, needs supervision From Standing Position, Turn to Look Behind Over each Shoulder: Needs supervision when turning Turn 360 Degrees: Able to turn 360 degrees safely but slowly Standing Unsupported, Alternately Place Feet on Step/Stool: Able to complete >2 steps/needs minimal assist Standing Unsupported, One Foot in Front: Needs help to step but can hold 15 seconds Standing on One Leg: Unable to try or needs assist to prevent fall Total Score: 32  See FIM for current functional status  Therapy/Group: Individual Therapy  Liv Rallis 12/01/2012,  10:33 AM

## 2012-12-01 NOTE — Progress Notes (Signed)
Subjective/Complaints: Doesn't like pulse oximeter!! Otherwise no complaints. Increased edema A 12 point review of systems has been performed and if not noted above is otherwise negative.   Objective: Vital Signs: Blood pressure 150/50, pulse 56, temperature 98.1 F (36.7 C), temperature source Oral, resp. rate 18, height 5\' 4"  (1.626 m), weight 105.053 kg (231 lb 9.6 oz), SpO2 93.00%. No results found.  Recent Labs  11/30/12 0727  WBC 6.0  HGB 12.2  HCT 35.2*  PLT 334    Recent Labs  11/30/12 0727  NA 137  K 4.6  CL 101  GLUCOSE 82  BUN 14  CREATININE 0.53  CALCIUM 9.2   CBG (last 3)  No results found for this basename: GLUCAP,  in the last 72 hours  Wt Readings from Last 3 Encounters:  11/27/12 105.053 kg (231 lb 9.6 oz)  11/19/12 103.4 kg (227 lb 15.3 oz)  09/11/12 104.327 kg (230 lb)    Physical Exam:  Constitutional: She is oriented to person, place, and time. She appears well-developed and well-nourished.  HENT:  Head: Normocephalic and atraumatic.  Eyes: Pupils are equal, round, and reactive to light.  Neck: Normal range of motion.  Cardiovascular: Normal rate and regular rhythm. Legs with 1+ to 2+ edema right more than left Pulmonary/Chest: Effort normal and breath sounds normal.  Abdominal: Soft. Bowel sounds are normal.  Neurological: She is alert and oriented to person, place, and time.  Speech clear. Able to follow simple commands without difficulty. Still a bit impulsive. I    Right upper extremity movement limited mostly due to pain. (as a whole may be a half-grade weaker than the left). Otherwise strength grossly 3+ to 4/5 in all limbs. No gross sensory findings.  Psych: Mood is pleasant Skin: small, cluster papular lesions on left buttock, near gluteal fold      Assessment/Plan: 1. Functional deficits secondary to moderate/mild TBI after fall which require 3+ hours per day of interdisciplinary therapy in a comprehensive inpatient rehab  setting. Physiatrist is providing close team supervision and 24 hour management of active medical problems listed below. Physiatrist and rehab team continue to assess barriers to discharge/monitor patient progress toward functional and medical goals.    FIM: FIM - Bathing Bathing Steps Patient Completed: Chest;Left Arm;Abdomen;Right Arm;Front perineal area;Right upper leg;Left upper leg;Right lower leg (including foot);Left lower leg (including foot);Buttocks Bathing: 4: Steadying assist  FIM - Upper Body Dressing/Undressing Upper body dressing/undressing steps patient completed: Thread/unthread right sleeve of pullover shirt/dresss;Thread/unthread left sleeve of pullover shirt/dress;Put head through opening of pull over shirt/dress;Pull shirt over trunk Upper body dressing/undressing: 5: Supervision: Safety issues/verbal cues FIM - Lower Body Dressing/Undressing Lower body dressing/undressing steps patient completed: Don/Doff left shoe;Don/Doff right shoe Lower body dressing/undressing: 4: Min-Patient completed 75 plus % of tasks  FIM - Toileting Toileting steps completed by patient: Adjust clothing prior to toileting;Performs perineal hygiene;Adjust clothing after toileting Toileting Assistive Devices: Grab bar or rail for support Toileting: 4: Steadying assist  FIM - Diplomatic Services operational officer Devices: Grab bars;Walker Toilet Transfers: 4-To toilet/BSC: Min A (steadying Pt. > 75%)  FIM - Bed/Chair Transfer Bed/Chair Transfer Assistive Devices: Bed rails;Arm rests Bed/Chair Transfer: 5: Supine > Sit: Supervision (verbal cues/safety issues);4: Sit > Supine: Min A (steadying pt. > 75%/lift 1 leg);4: Bed > Chair or W/C: Min A (steadying Pt. > 75%);4: Chair or W/C > Bed: Min A (steadying Pt. > 75%)  FIM - Locomotion: Wheelchair Locomotion: Wheelchair: 1: Travels less than 50 ft with supervision,  cueing or coaxing FIM - Locomotion: Ambulation Locomotion: Ambulation  Assistive Devices: Walker - Rolling Ambulation/Gait Assistance: 4: Min assist Locomotion: Ambulation: 2: Travels 50 - 149 ft with minimal assistance (Pt.>75%)  Comprehension Comprehension Mode: Auditory Comprehension: 5-Follows basic conversation/direction: With extra time/assistive device  Expression Expression Mode: Verbal Expression: 5-Expresses basic needs/ideas: With extra time/assistive device  Social Interaction Social Interaction: 6-Interacts appropriately with others with medication or extra time (anti-anxiety, antidepressant).  Problem Solving Problem Solving: 5-Solves basic problems: With no assist  Memory Memory: 5-Recognizes or recalls 90% of the time/requires cueing < 10% of the time  Medical Problem List and Plan:  1. DVT Prophylaxis/Anticoagulation: Pharmaceutical: Lovenox  2. Low back pain and OA: uses ibuprofen 800 mg/hs and currently stiff due to immobility and lack of NSAIDS. SE concerns reviewed. Will start celebrex. Will add Voltaren and and gel to help with pain right thumb.  3. Bipolar disorder: Continue Prozac. On adderall for activation/energy levels/attention. In good spirits, pleasant and generally appropriate. A little impulsive, but i think that's her personality.  4. Neuropsych: This patient is is not capable of making decisions on his/her own behalf.  5. Seizures: Seizure free on current dose of Depakote.. Level 47---maintain current dose  6. Proteus UTI: Completes cipro completed.  7. HTN: Monitor with bid checks. Off hyzaar at current time. Monitor for trends as activity increases.  8. RTC pathology: XR's without any acute findings. Conservative approach for RTC pain and to improve functional use of shoulder  9. Hypokalemia: likely dilutional effects. Elevated at admission likely due to ace.    10. Peripheral edema:  low salt restrictions with TEDs. Resume low hyzaar today paying close attention to lytes .  11. HSV--gluteal area- valtrex 500mg  bid x 3  days.  -may have had before  -believe we can dc contact precautions. 12. "Apneic" episodes----sats above 96% throughout the night---dc pulse oximetry    LOS (Days) 4 A FACE TO FACE EVALUATION WAS PERFORMED  SWARTZ,ZACHARY T 12/01/2012 8:05 AM

## 2012-12-02 ENCOUNTER — Inpatient Hospital Stay (HOSPITAL_COMMUNITY): Payer: Medicare Other | Admitting: Physical Therapy

## 2012-12-02 ENCOUNTER — Inpatient Hospital Stay (HOSPITAL_COMMUNITY): Payer: Medicare Other | Admitting: Occupational Therapy

## 2012-12-02 ENCOUNTER — Inpatient Hospital Stay (HOSPITAL_COMMUNITY): Payer: Medicare Other | Admitting: Speech Pathology

## 2012-12-02 LAB — URINE CULTURE

## 2012-12-02 NOTE — Progress Notes (Signed)
Social Work Patient ID: [name], female   DOB: 04-27-1931, 79 y.o.   MRN: [id number]  Have reviewed team conference with patient who is aware and agreeable with targeted d/c 4/25 at mod i goals.  Discussed a HH balance/ gait program - pt agreeable and have arranged via [facility] program with [place].  Continue to follow.  Quency Tober, LCSW

## 2012-12-02 NOTE — Progress Notes (Signed)
Physical Therapy Note  Patient Details  Name: [name] MRN: [id number] Date of Birth: 1930/12/06 Today's Date: 12/02/2012  Time: 900-955 55 minutes  No c/o pan.  Pt reports she still feels "stiff" in R wrist.  Gait with SW with distant supervision 150' x 2 with pt able to self correct LOB.  Fall recovery with min A and cuing for technique, pt reports she feels it is more difficult due to wrist soreness but that she has always "been able to manage at home".  [place] HEP performed with pt requiring cuing <10% of time to follow along with therex.  Standing balance on foam with 1 UE support with min A for reaching/tossing activity, pt with 1 LOB requiring max A to correct.  Pt with improved awareness, still with delayed balance reactions.  Individual therapy   [name] 12/02/2012, 9:54 AM

## 2012-12-02 NOTE — Progress Notes (Signed)
Speech Language Pathology Daily Session Note  Patient Details  Name: Akiba Melfi MRN: [id number] Date of Birth: Oct 22, 1930  Today's Date: 12/02/2012 Time: 1015-1100 Time Calculation (min): 45 min  Short Term Goals: Week 1: SLP Short Term Goal 1 (Week 1): Pt will indpendently consume a regular consistency diet with thin liquids without overt s/sx of aspiration. SLP Short Term Goal 2 (Week 1): Pt will independently demonstrate intellectual awareness of her cognitive-linguistic deficits. SLP Short Term Goal 3 (Week 1): Pt will exhibit sustained attention to functional task for 10 minutes with Min cues from SLP. SLP Short Term Goal 4 (Week 1): Pt will demonstrate adequate medication management with recall of current medication list with Min cues from SLP.  Skilled Therapeutic Interventions: Treatment focus on cognitive goals. Pt asking appropriate questions in regards to d/c planning and was overall Mod I for anticipatory awareness. Pt demonstrated divided attention between  complex conversation and self-care tasks with Mod I and demonstrated appropriate safety awareness with self-care tasks.    FIM:  Comprehension Comprehension Mode: Auditory Comprehension: 6-Follows complex conversation/direction: With extra time/assistive device Expression Expression Mode: Verbal Expression Assistive Devices: 6-Communication board Expression: 6-Expresses complex ideas: With extra time/assistive device Social Interaction Social Interaction Mode: Not assessed Social Interaction: 6-Interacts appropriately with others with medication or extra time (anti-anxiety, antidepressant). Problem Solving Problem Solving Mode: Not assessed Problem Solving: 5-Solves complex 90% of the time/cues < 10% of the time Memory Memory: 6-More than reasonable amt of time  Pain No/Denies Pain   Therapy/Group: Individual Therapy  Ruger Saxer 12/02/2012, 4:37 PM

## 2012-12-02 NOTE — Progress Notes (Signed)
Occupational Therapy Session Note  Patient Details  Name: [name] MRN: [id number] Date of Birth: [birth year]  Today's Date: 12/02/2012 Time: 0730-0830 Time Calculation (min): 60 min  Short Term Goals: Week 1:  OT Short Term Goal 1 (Week 1): N/A secondary to SLOS - see LTG's  Skilled Therapeutic Interventions/Progress Updates:    1:1 self care retraining at shower level with focus on functional ambulation with safe use of standard walker, sit to stands, activity tolerance, energy conservation, standing tolerance, AAROM to right hand prior to activity, increased functional use of right hand. Pt reports right hand feeling less sore after taking a shower and letting it get warm.  2nd session  No c/o pain  11:05-12 1:1 focus on functional ambulation with RW versus standard walker.  Pt chose RW for energy conservation.  Activity tolerance with functional ambulation down to gift shop. Pt able to go at more efficient speed with RW. PT able to navigate obstacles with supervision. Continued discussion about d/c planning.   Therapy Documentation Precautions:  Precautions Precautions: Fall Precaution Comments: Patient reports a slow spin while sitting still Required Braces or Orthoses:  (soft collar that patient requested for comfort) Cervical Brace: Soft collar Restrictions Weight Bearing Restrictions: No Pain:   no c/o pain  See FIM for current functional status  Therapy/Group: Individual Therapy  Roney Mans [facility] 12/02/2012, 12:12 PM
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
## 2012-12-02 NOTE — Progress Notes (Signed)
Patient O2 sat above 96 or above throughout the night. No sign of symptoms of distress. No complaint of any problem throughout the night.

## 2012-12-02 NOTE — Progress Notes (Signed)
Subjective/Complaints:  no problems last night. Rested comfortably A 12 point review of systems has been performed and if not noted above is otherwise negative.   Objective: Vital Signs: Blood pressure 120/54, pulse 53, temperature 98.3 F (36.8 C), temperature source Oral, resp. rate 18, height 5\' 4"  (1.626 m), weight 105.053 kg (231 lb 9.6 oz), SpO2 97.00%. No results found.  Recent Labs  11/30/12 0727  WBC 6.0  HGB 12.2  HCT 35.2*  PLT 334    Recent Labs  11/30/12 0727  NA 137  K 4.6  CL 101  GLUCOSE 82  BUN 14  CREATININE 0.53  CALCIUM 9.2   CBG (last 3)  No results found for this basename: GLUCAP,  in the last 72 hours  Wt Readings from Last 3 Encounters:  11/27/12 105.053 kg (231 lb 9.6 oz)  11/19/12 103.4 kg (227 lb 15.3 oz)  09/11/12 104.327 kg (230 lb)    Physical Exam:  Constitutional: She is oriented to person, place, and time. She appears well-developed and well-nourished.  HENT:  Head: Normocephalic and atraumatic.  Eyes: Pupils are equal, round, and reactive to light.  Neck: Normal range of motion.  Cardiovascular: Normal rate and regular rhythm. Legs with 1+ to 2+ edema right more than left Pulmonary/Chest: Effort normal and breath sounds normal.  Abdominal: Soft. Bowel sounds are normal.  Neurological: She is alert and oriented to person, place, and time.  Speech clear. Able to follow simple commands without difficulty. Still a bit impulsive. I    Right upper extremity movement limited mostly due to pain. (as a whole may be a half-grade weaker than the left). Otherwise strength grossly 4- to 4/5 in all limbs. No gross sensory findings.  Psych: Mood is pleasant Skin: small, cluster papular lesions on left buttock, near gluteal fold      Assessment/Plan: 1. Functional deficits secondary to moderate/mild TBI after fall which require 3+ hours per day of interdisciplinary therapy in a comprehensive inpatient rehab setting. Physiatrist is  providing close team supervision and 24 hour management of active medical problems listed below. Physiatrist and rehab team continue to assess barriers to discharge/monitor patient progress toward functional and medical goals.    FIM: FIM - Bathing Bathing Steps Patient Completed: Chest;Left Arm;Abdomen;Right Arm;Front perineal area;Right upper leg;Left upper leg;Right lower leg (including foot);Left lower leg (including foot);Buttocks Bathing: 4: Steadying assist  FIM - Upper Body Dressing/Undressing Upper body dressing/undressing steps patient completed: Thread/unthread right sleeve of pullover shirt/dresss;Thread/unthread left sleeve of pullover shirt/dress;Put head through opening of pull over shirt/dress;Pull shirt over trunk Upper body dressing/undressing: 5: Supervision: Safety issues/verbal cues FIM - Lower Body Dressing/Undressing Lower body dressing/undressing steps patient completed: Don/Doff left shoe;Don/Doff right shoe Lower body dressing/undressing: 4: Min-Patient completed 75 plus % of tasks  FIM - Toileting Toileting steps completed by patient: Adjust clothing prior to toileting;Performs perineal hygiene;Adjust clothing after toileting Toileting Assistive Devices: Grab bar or rail for support Toileting: 4: Steadying assist  FIM - Diplomatic Services operational officer Devices: Grab bars;Walker Toilet Transfers: 4-To toilet/BSC: Min A (steadying Pt. > 75%)  FIM - Bed/Chair Transfer Bed/Chair Transfer Assistive Devices: Bed rails;Arm rests Bed/Chair Transfer: 5: Chair or W/C > Bed: Supervision (verbal cues/safety issues);5: Bed > Chair or W/C: Supervision (verbal cues/safety issues)  FIM - Locomotion: Wheelchair Locomotion: Wheelchair: 1: Travels less than 50 ft with supervision, cueing or coaxing FIM - Locomotion: Ambulation Locomotion: Ambulation Assistive Devices: Designer, industrial/product Ambulation/Gait Assistance: 4: Min assist Locomotion: Ambulation: 5: Travels 150  ft or more with supervision/safety issues  Comprehension Comprehension Mode: Auditory Comprehension: 6-Follows complex conversation/direction: With extra time/assistive device  Expression Expression Mode: Verbal Expression: 6-Expresses complex ideas: With extra time/assistive device  Social Interaction Social Interaction: 6-Interacts appropriately with others with medication or extra time (anti-anxiety, antidepressant).  Problem Solving Problem Solving: 5-Solves complex 90% of the time/cues < 10% of the time  Memory Memory: 5-Recognizes or recalls 90% of the time/requires cueing < 10% of the time  Medical Problem List and Plan:  1. DVT Prophylaxis/Anticoagulation: Pharmaceutical: Lovenox  2. Low back pain and OA: uses ibuprofen 800 mg/hs and currently stiff due to immobility and lack of NSAIDS. SE concerns reviewed. Will start celebrex. Will add Voltaren and and gel to help with pain right thumb.  3. Bipolar disorder: Continue Prozac. On adderall for activation/energy levels/attention. In good spirits, pleasant and generally appropriate. A little impulsive, but i think that's her personality.  4. Neuropsych: This patient is is not capable of making decisions on his/her own behalf.  5. Seizures: Seizure free on current dose of Depakote.. Level 47---maintain current dose  6. Proteus UTI: Completes cipro completed.  7. HTN: Monitor with bid checks. Off hyzaar at current time. Monitor for trends as activity increases.  8. RTC pathology: XR's without any acute findings. Conservative approach for RTC pain and to improve functional use of shoulder  9. Hypokalemia: likely dilutional effects. Elevated at admission likely due to ace.    10. Peripheral edema:  low salt restrictions with TEDs. Resume low hyzaar today paying close attention to lytes .  11. HSV--gluteal area- valtrex 500mg  bid x 3 days.  -may have had before   12. "Apneic" episodes----sats above 96% throughout the night---dc'ed  pulse oximetry    LOS (Days) 5 A FACE TO FACE EVALUATION WAS PERFORMED  Breella Vanostrand T 12/02/2012 8:16 AM

## 2012-12-02 NOTE — Progress Notes (Signed)
Physical Therapy Note  Patient Details  Name: [name] MRN: [id number] Date of Birth: [birth year] Today's Date: 12/02/2012  Time: 1300-1341 41 minutes  No c/o pain.  Gait with RW with pt with much more efficient and fluid gait.  Pt able to control RW and states she now feels comfortable using RW for mobility and gait at home.  Ramp/curb negotiation with RW with close supervision, cuing for safety and sequencing.  Step downs with RW with focus on B LE eccentric control.  Pt fatigues easily and relies on UEs for support.  Pt reports putting pressure through hands causes them soreness, ice applied after session.  Kinetron for B LE strength and endurance x 5 minutes.   Individual therapy   Jefry Lesinski 12/02/2012, 1:42 PM

## 2012-12-03 ENCOUNTER — Ambulatory Visit (HOSPITAL_COMMUNITY): Payer: Medicare Other | Admitting: Physical Therapy

## 2012-12-03 ENCOUNTER — Inpatient Hospital Stay (HOSPITAL_COMMUNITY): Payer: Medicare Other | Admitting: Physical Therapy

## 2012-12-03 ENCOUNTER — Inpatient Hospital Stay (HOSPITAL_COMMUNITY): Payer: Medicare Other | Admitting: Speech Pathology

## 2012-12-03 ENCOUNTER — Encounter (HOSPITAL_COMMUNITY): Payer: Medicare Other | Admitting: Occupational Therapy

## 2012-12-03 LAB — BASIC METABOLIC PANEL
BUN: 12 mg/dL (ref 6–23)
Calcium: 9.3 mg/dL (ref 8.4–10.5)
Creatinine, Ser: 0.56 mg/dL (ref 0.50–1.10)
GFR calc non Af Amer: 86 mL/min — ABNORMAL LOW (ref >90)
Glucose, Bld: 87 mg/dL (ref 70–99)

## 2012-12-03 MED ORDER — HYDROCHLOROTHIAZIDE 12.5 MG PO CAPS
12.5000 mg | ORAL_CAPSULE | Freq: Every day | ORAL | Status: DC
  Administered 2012-12-03 – 2012-12-04 (×2): 12.5 mg via ORAL
  Filled 2012-12-03 (×3): qty 1

## 2012-12-03 MED ORDER — HYDROCHLOROTHIAZIDE 25 MG PO TABS
12.5000 mg | ORAL_TABLET | Freq: Every day | ORAL | Status: DC
  Filled 2012-12-03 (×2): qty 0.5

## 2012-12-03 NOTE — Progress Notes (Signed)
Social Work  Discharge Note  The overall goal for the admission was met for:   Discharge location: Yes - home with intermittent assist of friends  Length of Stay: Yes - 7 days  Discharge activity level: Yes - mod i   Home/community participation: Yes  Services provided included: MD, RD, PT, OT, RN, TR, Pharmacy and SW  Financial Services: Medicare and Medicaid  Follow-up services arranged: Home Health: RN, PT via [facility], DME: rolling walker via [facility] and Patient/Family has no preference for HH/DME agencies  Comments (or additional information):  Patient/Family verbalized understanding of follow-up arrangements: Yes  Individual responsible for coordination of the follow-up plan: patient  Confirmed correct DME delivered: [name] 12/03/2012    Maryella Abood

## 2012-12-03 NOTE — Progress Notes (Signed)
Physical Therapy Note  Patient Details  Name: [name] MRN: [id number] Date of Birth: 1931/08/08 Today's Date: 12/03/2012  Time: 1030-1124 54 minutes  No c/o pain.  Gait in controlled and home environments with RW with mod I.  Pt encouraged to use RW at all times for mobility at home.  Balance training without UE support on compliant surfaces with min steadying assist for static balance, max A for dynamic balance.  Step ups to compliant surface with mod A.  Lateral and fwd/bkwd step ups for B hip strengthening with pt depending on UEs for support.  Coordination stepping activity with close supervision for balance, supervision cues for sequencing.  Kinetron for LE strengthening x 6 minutes seated.  Pt anxious about medication management for home but reports she feels comfortable with gait and mobility.  Individual therapy   Alexanderia Gorby 12/03/2012, 11:25 AM

## 2012-12-03 NOTE — Progress Notes (Signed)
Speech Language Pathology Daily Session Note  Patient Details  Name: [name] MRN: [id number] Date of Birth: Jul 03, 1931  Today's Date: 12/03/2012 Time: 0865-7846 Time Calculation (min): 40 min  Short Term Goals: Week 1: SLP Short Term Goal 1 (Week 1): Pt will indpendently consume a regular consistency diet with thin liquids without overt s/sx of aspiration. SLP Short Term Goal 2 (Week 1): Pt will independently demonstrate intellectual awareness of her cognitive-linguistic deficits. SLP Short Term Goal 3 (Week 1): Pt will exhibit sustained attention to functional task for 10 minutes with Min cues from SLP. SLP Short Term Goal 4 (Week 1): Pt will demonstrate adequate medication management with recall of current medication list with Min cues from SLP.  Skilled Therapeutic Interventions: Group treatment session focused on addressing cognitive goals.  SLP facilitated session with discussion regarding discharge planning and modifications that need to be in place for home management to ensure safety.  Patient demonstrated overall modified independence for anticipatory awareness.  Patient demonstrated alternating attention between complex conversations with other group participants without assist. Goals met and patient is ready for discharge.    FIM:  Comprehension Comprehension Mode: Auditory Comprehension: 7-Follows complex conversation/direction: With no assist Expression Expression Mode: Verbal Expression: 7-Expresses complex ideas: With no assist Social Interaction Social Interaction: 6-Interacts appropriately with others with medication or extra time (anti-anxiety, antidepressant). Problem Solving Problem Solving: 6-Solves complex problems: With extra time Memory Memory: 6-More than reasonable amt of time FIM - Eating Eating Activity: 7: Complete independence:no helper  Pain Pain Assessment Pain Assessment: No/denies pain  Therapy/Group: Group Therapy  Charlane Ferretti.,  CCC-SLP [phone]  [name] 12/03/2012, 4:27 PM

## 2012-12-03 NOTE — Progress Notes (Signed)
Physical Therapy Note  Patient Details  Name: Jesseka Drinkard MRN: [id number] Date of Birth: 03-11-1931 Today's Date: 12/03/2012  Time: 1430-1530 60 minutes Group therapy  No c/o pain.  Pt participated in group therapy for gait and balance training without AD at close supervision level, with RW at mod I level.  Pt continues to be educated on importance of using RW for safety at home.   Niamya Vittitow 12/03/2012, 3:55 PM

## 2012-12-03 NOTE — Progress Notes (Signed)
Subjective/Complaints: Up a lot due to urinary frequency. Excited to go home tomorrow A 12 point review of systems has been performed and if not noted above is otherwise negative.   Objective: Vital Signs: Blood pressure 148/77, pulse 58, temperature 98 F (36.7 C), temperature source Oral, resp. rate 18, height 5\' 4"  (1.626 m), weight 104.4 kg (230 lb 2.6 oz), SpO2 95.00%. No results found. No results found for this basename: WBC, HGB, HCT, PLT,  in the last 72 hours  Recent Labs  12/03/12 0647  NA 137  K 3.8  CL 103  GLUCOSE 87  BUN 12  CREATININE 0.56  CALCIUM 9.3   CBG (last 3)  No results found for this basename: GLUCAP,  in the last 72 hours  Wt Readings from Last 3 Encounters:  12/02/12 104.4 kg (230 lb 2.6 oz)  11/19/12 103.4 kg (227 lb 15.3 oz)  09/11/12 104.327 kg (230 lb)    Physical Exam:  Constitutional: She is oriented to person, place, and time. She appears well-developed and well-nourished.  HENT:  Head: Normocephalic and atraumatic.  Eyes: Pupils are equal, round, and reactive to light.  Neck: Normal range of motion.  Cardiovascular: Normal rate and regular rhythm. Legs with 1+ to 2+ edema right more than left Pulmonary/Chest: Effort normal and breath sounds normal.  Abdominal: Soft. Bowel sounds are normal.  Neurological: She is alert and oriented to person, place, and time.  Speech clear. Able to follow simple commands without difficulty. Still a bit impulsive. I    Right upper extremity movement limited mostly due to pain. (as a whole may be a half-grade weaker than the left). Otherwise strength grossly 4- to 4/5 in all limbs. No gross sensory findings.  Psych: Mood is pleasant Skin: small, cluster papular lesions on left buttock, near gluteal fold      Assessment/Plan: 1. Functional deficits secondary to moderate/mild TBI after fall which require 3+ hours per day of interdisciplinary therapy in a comprehensive inpatient rehab  setting. Physiatrist is providing close team supervision and 24 hour management of active medical problems listed below. Physiatrist and rehab team continue to assess barriers to discharge/monitor patient progress toward functional and medical goals.    FIM: FIM - Bathing Bathing Steps Patient Completed: Chest;Left Arm;Abdomen;Right Arm;Front perineal area;Right upper leg;Left upper leg;Right lower leg (including foot);Left lower leg (including foot);Buttocks Bathing: 5: Set-up assist to: Adjust water temp  FIM - Upper Body Dressing/Undressing Upper body dressing/undressing steps patient completed: Thread/unthread right sleeve of pullover shirt/dresss;Thread/unthread left sleeve of pullover shirt/dress;Put head through opening of pull over shirt/dress;Pull shirt over trunk Upper body dressing/undressing: 5: Set-up assist to: Obtain clothing/put away FIM - Lower Body Dressing/Undressing Lower body dressing/undressing steps patient completed: Thread/unthread right underwear leg;Thread/unthread left underwear leg;Pull underwear up/down;Thread/unthread right pants leg;Thread/unthread left pants leg;Pull pants up/down;Don/Doff left sock;Don/Doff right sock Lower body dressing/undressing: 5: Set-up assist to: Obtain clothing  FIM - Toileting Toileting steps completed by patient: Adjust clothing prior to toileting;Performs perineal hygiene;Adjust clothing after toileting Toileting Assistive Devices: Grab bar or rail for support Toileting: 5: Supervision: Safety issues/verbal cues  FIM - Diplomatic Services operational officer Devices: Grab bars;Walker Toilet Transfers: 5-Set-up assist to: Apply orthosis/W/C setup;5-To toilet/BSC: Supervision (verbal cues/safety issues)  FIM - Banker Devices: Bed rails;Arm rests Bed/Chair Transfer: 5: Chair or W/C > Bed: Supervision (verbal cues/safety issues);5: Bed > Chair or W/C: Supervision (verbal cues/safety  issues)  FIM - Locomotion: Wheelchair Locomotion: Wheelchair: 1: Travels less than 50  ft with supervision, cueing or coaxing FIM - Locomotion: Ambulation Locomotion: Ambulation Assistive Devices: Walker - Rolling Ambulation/Gait Assistance: 4: Min assist Locomotion: Ambulation: 5: Travels 150 ft or more with supervision/safety issues  Comprehension Comprehension Mode: Auditory Comprehension: 6-Follows complex conversation/direction: With extra time/assistive device  Expression Expression Mode: Verbal Expression Assistive Devices: 6-Communication board Expression: 6-Expresses complex ideas: With extra time/assistive device  Social Interaction Social Interaction Mode: Not assessed Social Interaction: 6-Interacts appropriately with others with medication or extra time (anti-anxiety, antidepressant).  Problem Solving Problem Solving Mode: Not assessed Problem Solving: 5-Solves basic 90% of the time/requires cueing < 10% of the time  Memory Memory: 6-More than reasonable amt of time  Medical Problem List and Plan:  1. DVT Prophylaxis/Anticoagulation: Pharmaceutical: Lovenox  2. Low back pain and OA: uses ibuprofen 800 mg/hs and currently stiff due to immobility and lack of NSAIDS. SE concerns reviewed. Will start celebrex. Will add Voltaren and and gel to help with pain right thumb.  3. Bipolar disorder: Continue Prozac. On adderall for activation/energy levels/attention. In good spirits, pleasant and generally appropriate. A little impulsive, but i think that's her personality.  4. Neuropsych: This patient is is not capable of making decisions on his/her own behalf.  5. Seizures: Seizure free on current dose of Depakote.. Level 47---maintain current dose  6. Proteus UTI: Completes cipro completed.  7. HTN: Monitor with bid checks. Off hyzaar at current time. Monitor for trends as activity increases.  8. RTC pathology: XR's without any acute findings. Conservative approach for RTC  pain and to improve functional use of shoulder  9. Hypokalemia: likely dilutional effects. Elevated at admission likely due to ace.    10. Peripheral edema:  low salt restrictions with TEDs. Resumed hctz--improved  -decrease hctz to 12.5mg    -urinary frequency likely from hctz, will recheck urine as well.  -BMET WNL today 11. HSV--gluteal area- valtrex 500mg  bid x 3 days.  -may have had before   12. "Apneic" episodes----sats above 96% throughout the night---dc'ed pulse oximetry    LOS (Days) 6 A FACE TO FACE EVALUATION WAS PERFORMED  SWARTZ,ZACHARY T 12/03/2012 8:48 AM

## 2012-12-03 NOTE — Progress Notes (Signed)
Occupational Therapy daily note and  Discharge Summary  Patient Details  Name: [name] MRN: [id number] Date of Birth: 1931-03-04  Today's Date: 12/03/2012 Time: 05-1029 Time Calculation (min): 30 min 1:1 GRAD day!! Self care retraining completed at Mod I level. PT made Mod I in her room. Focus on ace wrapping for bilateral LEs for edema control. Pt unable to don TEDS on her own. Continued to discuss d/c planning and using friends to assist with IADLs.   Patient has met 11 of 11 long term goals due to improved activity tolerance, improved balance, postural control, ability to compensate for deficits, functional use of  RIGHT upper, RIGHT lower, LEFT upper and LEFT lower extremity, improved attention, improved awareness and improved coordination.  Patient to discharge at overall Modified Independent level.  Pt has neighbors next door who can assist with IADLs and with driving her in the community.    Reasons goals not met: n/a Recommendation:  Patient will benefit from ongoing skilled OT services in home health setting to continue to advance functional skills in the area of BADL, iADL and Vocation.  Equipment: No equipment provided  Reasons for discharge: treatment goals met and discharge from hospital  Patient/family agrees with progress made and goals achieved: Yes  OT Discharge Precautions/Restrictions  Precautions Precaution Comments: bilateral LEs swelling  Restrictions Weight Bearing Restrictions: No Pain Soreness in hands ADL  see FIM Vision/Perception  Vision - History Baseline Vision: No visual deficits Visual History: Brain injury Patient Visual Report: Blurring of vision Vision - Assessment Eye Alignment: Within Functional Limits Perception Perception: Within Functional Limits Praxis Praxis: Intact  Cognition Overall Cognitive Status: Within Functional Limits for tasks assessed Arousal/Alertness: Awake/alert Orientation Level: Oriented X4 Attention:  Alternating;Divided Alternating Attention: Appears intact Divided Attention: Appears intact Memory: Appears intact Awareness: Appears intact Problem Solving: Appears intact Initiating: Appears intact Self Monitoring: Appears intact Safety/Judgment: Appears intact [place] Scales of Cognitive Functioning: Purposeful/appropriate Sensation Sensation Light Touch: Appears Intact Stereognosis: Appears Intact Hot/Cold: Appears Intact Proprioception: Appears Intact Coordination Gross Motor Movements are Fluid and Coordinated: Yes Fine Motor Movements are Fluid and Coordinated: No (no in right hand) Motor  Motor Motor: Within Functional Limits Mobility  Transfers Sit to Stand: 6: Modified independent (Device/Increase time) Stand to Sit: 6: Modified independent (Device/Increase time)  Trunk/Postural Assessment  Cervical Assessment Cervical Assessment: Within Functional Limits Thoracic Assessment Thoracic Assessment: Within Functional Limits Lumbar Assessment Lumbar Assessment: Within Functional Limits Postural Control Postural Control: Within Functional Limits  Balance Static Sitting Balance Static Sitting - Level of Assistance: 7: Independent Dynamic Sitting Balance Dynamic Sitting - Level of Assistance: 7: Independent Static Standing Balance Static Standing - Level of Assistance: 6: Modified independent (Device/Increase time) Extremity/Trunk Assessment RUE Assessment RUE Assessment: Exceptions to [facility] (pt with pain and decr strength/ ROM in right UE) LUE Assessment LUE Assessment: Within Functional Limits  See FIM for current functional status  Roney Mans [facility] 12/03/2012, 4:22 PM

## 2012-12-03 NOTE — Discharge Summary (Signed)
Physician Discharge Summary  Patient ID: [name] MRN: [id number] DOB/AGE: 1930-11-13 79 y.o.  Admit date: 11/27/2012 Discharge date: 12/04/2012  Discharge Diagnoses:  Principal Problem:   Concussion with no loss of consciousness Active Problems:   BIPOLAR DISORDER   HYPERTENSION, BENIGN SYSTEMIC   OSTEOARTHRITIS, MULTI SITES   ROTATOR CUFF TENDONITIS   CONVULSIONS, SEIZURES, NOS   Peripheral edema   Discharged Condition: Good.   Labs:  Basic Metabolic Panel:  Recent Labs Lab 11/30/12 0727 12/03/12 0647 12/04/12 0600  NA 137 137  --   K 4.6 3.8  --   CL 101 103  --   CO2 29 26  --   GLUCOSE 82 87  --   BUN 14 12  --   CREATININE 0.53 0.56 0.62  CALCIUM 9.2 9.3  --     CBC:  Recent Labs Lab 11/30/12 0727  WBC 6.0  NEUTROABS 3.3  HGB 12.2  HCT 35.2*  MCV 86.9  PLT 334    CBG: No results found for this basename: GLUCAP,  in the last 168 hours  Brief HPI:   [name] is a 79 y.o. female with history of prior craniotomy and seizure disorder, bipolar disorder, depression; who was at [facility] on 11/19/12 and seemed to have been hit by a forklift cart. Patient fell to the ground and was noted to have left sided weakness. She has seizure activity X 2 past admission and was started on Depacon for treatment. She continued to have RUE weakness with mild speech deficits. Therapy evaluation was done and CIR was recommended for follow up therapies.    Hospital Course: [name] was admitted to rehab 11/27/2012 for inpatient therapies to consist of PT, ST and OT at least three hours five days a week. Past admission physiatrist, therapy team and rehab RN have worked together to provide customized collaborative inpatient rehab. She was maintained on depakote and has been seizure free during the stay. Depakote level at time at discharge is therapeutic at 51.2. Blood pressures were monitored on bid basis and have been reasonably controlled. She was started on  celebrex to help with arthritis symptoms. She did develop peripheral edema therefore low dose HCTZ was resumed. Celebrex was discontinued and she was advised to limit ibuprofen to 200 mg bid prn past discharge. She is advised to continue low salt diet as well as TEDs to help edema past discharge. She did have an outbreak of HSV on gluteal area and was treated with valtrex x three days.   Rehab course: During patient's stay in rehab weekly team conferences were held to monitor patient's progress, set goals and discuss barriers to discharge. Speech therapy has worked on cognitive linguistic deficits. Patient overall modified independent for anticipatory awareness. She has demonstrated divided attention between complex conversation and self-care tasks and demonstrated appropriate safety awareness with self-care tasks. Occupational therapy has worked with patient on ADL, AAROM  right hand and improving functional use of right hand.  She is modified independent for bathing and dressing tasks.  Physical therapy has worked on balance, ambulation and safety. Patient is modified independent for ambulation with rolling walker. She has been advised to use RW for all mobility past discharge.   Disposition: 01-Home or Self Care   Diet: Regular. Low salt.   Special Instructions: 1. Do not use hyzaar. 2.  Genevieve Norlander to provide HHPT,HHRN.        Future Appointments Provider Department Dept Phone   12/21/2012 12:00 PM Ranelle Oyster, MD  [facility] [phone]       Medication List    STOP taking these medications       aspirin EC 81 MG tablet     losartan-hydrochlorothiazide 100-25 MG per tablet  Commonly known as:  HYZAAR      TAKE these medications       albuterol 108 (90 BASE) MCG/ACT inhaler  Commonly known as:  PROVENTIL HFA;VENTOLIN HFA  Inhale 2 puffs into the lungs every 4 (four) hours as needed for wheezing or shortness of breath.      amphetamine-dextroamphetamine 10 MG tablet  Commonly known as:  ADDERALL  Take 1 tablet (10 mg total) by mouth daily.     atorvastatin 10 MG tablet  Commonly known as:  LIPITOR  Take 10 mg by mouth daily.     diclofenac sodium 1 % Gel  Commonly known as:  VOLTAREN  Apply 2 g topically 4 (four) times daily. Right thumb area.     divalproex 250 MG DR tablet  Commonly known as:  DEPAKOTE  Take on pill daily in am.     divalproex 500 MG DR tablet  Commonly known as:  DEPAKOTE  Take 1 tablet (500 mg total) by mouth at bedtime.     FLUoxetine 20 MG capsule  Commonly known as:  PROZAC  Take 2 capsules (40 mg total) by mouth daily.     hydrochlorothiazide 12.5 MG capsule  Commonly known as:  MICROZIDE  Take 1 capsule (12.5 mg total) by mouth daily.     metoprolol succinate 25 MG 24 hr tablet  Commonly known as:  TOPROL-XL  Take 1 tablet (25 mg total) by mouth daily.       Follow-up Information   Follow up with Ranelle Oyster, MD On 12/21/2012. (Be there at 11:30 for noon appointment)    Contact information:   [address], [address] [place] [address] [phone]       Follow up with Gates Rigg, MD. Call today. (for follow up past seizures)    Contact information:   [address] [place] [address] [phone]       Follow up with Sanjuana Letters, MD. Call on 12/04/2012. (for follow up in two weeks. )    Contact information:   [address] [place] [address] [phone]       Signed: Jacquelynn Cree 12/04/2012, 3:14 PM

## 2012-12-03 NOTE — Progress Notes (Signed)
Physical Therapy Session Note  Patient Details  Name: [name] MRN: [id number] Date of Birth: 1931/03/22  Today's Date: 12/03/2012 Time: 0272-5366 Time Calculation (min): 54 min   Skilled Therapeutic Interventions/Progress Updates:    Pt eating breakfast upon entering. Discussed home discharge and safety in the home. Recommended pt carry cell phone with her or invest in [employer] system as she lives alone. Pt reports she will consider. Ambulation 2 x 200' with RW and modified independence. Home ambulation with RW x 100' with modified independence, bed mobility on actual bed independent. Furniture transfers modified independent.Lawernce Keas stepping with RW with supervision progressing to modified independent over carpet. Stairs x 9 with modified independence, pt able to perform with alternating step sequence.  Pt attempted to don TEDS hose by self, unable after various methods attempted. Pt has poor oppositional strength of thumbs. Pt will likely need to ACE wrap at home as she has no available assistance and bil. LE swelling is an issue.   Therapy Documentation Precautions:  Precautions Precautions: Fall Precaution Comments: Patient reports a slow spin while sitting still Required Braces or Orthoses:  (soft collar that patient requested for comfort) Cervical Brace: Soft collar Restrictions Weight Bearing Restrictions: No General: Amount of Missed PT Time (min): 6 Minutes Missed Time Reason:  (pt desired to finish breakfast) Pain: Pain Assessment Pain Assessment: 0-10 Pain Score:  0/10  See FIM for current functional status  Therapy/Group: Individual Therapy  Wilhemina Bonito 12/03/2012, 11:47 AM

## 2012-12-03 NOTE — Progress Notes (Signed)
Nursing Note: Pt  Refuses pulse ox at [facility] .PO2 99%the patient resting quietly in bed.wbb

## 2012-12-04 LAB — URINE CULTURE

## 2012-12-04 LAB — VALPROIC ACID LEVEL: Valproic Acid Lvl: 51.2 ug/mL (ref 50.0–100.0)

## 2012-12-04 LAB — CREATININE, SERUM
GFR calc Af Amer: >90 mL/min (ref >90)
GFR calc non Af Amer: 84 mL/min — ABNORMAL LOW (ref >90)

## 2012-12-04 MED ORDER — DIVALPROEX SODIUM 500 MG PO DR TAB: 500.0000 mg | DELAYED_RELEASE_TABLET | Freq: Every day | ORAL | Status: DC

## 2012-12-04 MED ORDER — HYDROCHLOROTHIAZIDE 12.5 MG PO CAPS: 12.5000 mg | ORAL_CAPSULE | Freq: Every day | ORAL | Status: DC

## 2012-12-04 MED ORDER — FLUOXETINE HCL 20 MG PO CAPS: 40.0000 mg | ORAL_CAPSULE | Freq: Every day | ORAL | Status: DC

## 2012-12-04 MED ORDER — METOPROLOL SUCCINATE ER 25 MG PO TB24: 25.0000 mg | ORAL_TABLET | Freq: Every day | ORAL | Status: DC

## 2012-12-04 MED ORDER — DICLOFENAC SODIUM 1 % TD GEL: 2.0000 g | Freq: Four times a day (QID) | TRANSDERMAL | Status: DC

## 2012-12-04 MED ORDER — DIVALPROEX SODIUM 250 MG PO DR TAB: DELAYED_RELEASE_TABLET | ORAL | Status: DC

## 2012-12-04 NOTE — Progress Notes (Signed)
Subjective/Complaints: Frequency better. Some drainage from butttock." I feel better than I did before I fell" A 12 point review of systems has been performed and if not noted above is otherwise negative.   Objective: Vital Signs: Blood pressure 153/93, pulse 59, temperature 97.9 F (36.6 C), temperature source Oral, resp. rate 20, height 5\' 4"  (1.626 m), weight 104.4 kg (230 lb 2.6 oz), SpO2 95.00%. No results found. No results found for this basename: WBC, HGB, HCT, PLT,  in the last 72 hours  Recent Labs  12/03/12 0647 12/04/12 0600  NA 137  --   K 3.8  --   CL 103  --   GLUCOSE 87  --   BUN 12  --   CREATININE 0.56 0.62  CALCIUM 9.3  --    CBG (last 3)  No results found for this basename: GLUCAP,  in the last 72 hours  Wt Readings from Last 3 Encounters:  12/02/12 104.4 kg (230 lb 2.6 oz)  11/19/12 103.4 kg (227 lb 15.3 oz)  09/11/12 104.327 kg (230 lb)    Physical Exam:  Constitutional: She is oriented to person, place, and time. She appears well-developed and well-nourished.  HENT:  Head: Normocephalic and atraumatic.  Eyes: Pupils are equal, round, and reactive to light.  Neck: Normal range of motion.  Cardiovascular: Normal rate and regular rhythm. Legs with 1+ to 2+ edema right more than left Pulmonary/Chest: Effort normal and breath sounds normal.  Abdominal: Soft. Bowel sounds are normal.  Neurological: She is alert and oriented to person, place, and time.  Speech clear. Able to follow simple commands without difficulty. Still a bit impulsive. I    Right upper extremity movement limited mostly due to pain. (as a whole may be a half-grade weaker than the left). Otherwise strength grossly 4+ to 5/5 in all limbs. No gross sensory findings. Stands with good balance, ambulates without assist Psych: Mood is pleasant Skin: small, cluster papular lesions on left buttock have broken and scabbed over.    Assessment/Plan: 1. Functional deficits secondary to  moderate/mild TBI after fall which require 3+ hours per day of interdisciplinary therapy in a comprehensive inpatient rehab setting. Physiatrist is providing close team supervision and 24 hour management of active medical problems listed below. Physiatrist and rehab team continue to assess barriers to discharge/monitor patient progress toward functional and medical goals.    FIM: FIM - Bathing Bathing Steps Patient Completed: Chest;Left Arm;Abdomen;Right Arm;Front perineal area;Right upper leg;Left upper leg;Right lower leg (including foot);Left lower leg (including foot);Buttocks Bathing: 6: More than reasonable amount of time  FIM - Upper Body Dressing/Undressing Upper body dressing/undressing steps patient completed: Thread/unthread right sleeve of pullover shirt/dresss;Thread/unthread left sleeve of pullover shirt/dress;Put head through opening of pull over shirt/dress;Pull shirt over trunk Upper body dressing/undressing: 6: More than reasonable amount of time FIM - Lower Body Dressing/Undressing Lower body dressing/undressing steps patient completed: Thread/unthread right underwear leg;Thread/unthread left underwear leg;Pull underwear up/down;Thread/unthread right pants leg;Thread/unthread left pants leg;Pull pants up/down;Don/Doff left sock;Don/Doff right sock Lower body dressing/undressing: 6: More than reasonable amount of time  FIM - Toileting Toileting steps completed by patient: Adjust clothing prior to toileting;Performs perineal hygiene;Adjust clothing after toileting Toileting Assistive Devices: Grab bar or rail for support Toileting: 6: More than reasonable amount of time  FIM - Diplomatic Services operational officer Devices: Grab bars;Walker Toilet Transfers: 6-More than reasonable amt of time;6-To toilet/ BSC;6-From toilet/BSC  FIM - Banker Devices: Bed rails;Arm rests Bed/Chair Transfer: 7:  Supine > Sit: No assist;7: Sit >  Supine: No assist;6: Bed > Chair or W/C: No assist;6: Chair or W/C > Bed: No assist  FIM - Locomotion: Wheelchair Locomotion: Wheelchair: 1: Travels less than 50 ft with supervision, cueing or coaxing FIM - Locomotion: Ambulation Locomotion: Ambulation Assistive Devices: Designer, industrial/product Ambulation/Gait Assistance: 6: Modified independent (Device/Increase time) Locomotion: Ambulation: 6: Travels 150 ft or more with assistive device/no helper  Comprehension Comprehension Mode: Auditory Comprehension: 7-Follows complex conversation/direction: With no assist  Expression Expression Mode: Verbal Expression Assistive Devices: 6-Communication board Expression: 6-Expresses complex ideas: With extra time/assistive device  Social Interaction Social Interaction Mode: Not assessed Social Interaction: 6-Interacts appropriately with others with medication or extra time (anti-anxiety, antidepressant).  Problem Solving Problem Solving Mode: Not assessed Problem Solving: 6-Solves complex problems: With extra time  Memory Memory: 6-More than reasonable amt of time  Medical Problem List and Plan:  1. DVT Prophylaxis/Anticoagulation: Pharmaceutical: Lovenox  2. Low back pain and OA: uses ibuprofen 800 mg/hs and currently stiff due to immobility and lack of NSAIDS. SE concerns reviewed. Will start celebrex. Will add Voltaren and and gel to help with pain right thumb.  3. Bipolar disorder: Continue Prozac. On adderall for activation/energy levels/attention. In good spirits, pleasant and generally appropriate. A little impulsive, but i think that's her personality.  4. Neuropsych: This patient is is not capable of making decisions on his/her own behalf.  5. Seizures: Seizure free on current dose of Depakote.. Level 47---maintain current dose  6. Proteus UTI: Completes cipro completed.  7. HTN: Monitor with bid checks. Off hyzaar at current time. Monitor for trends as activity increases.  8. RTC  pathology: XR's without any acute findings. Conservative approach for RTC pain and to improve functional use of shoulder  9. Hypokalemia: likely dilutional effects. Elevated at admission likely due to ace.    10. Peripheral edema:  low salt restrictions with TEDs.    -decreased hctz to 12.5mg  which helped  -would dc home on 12.5mg  hctz (without cozaar)   -urinary frequency likely from hctz, ucx pending  -BMET WNL  11. HSV--gluteal area- valtrex 500mg  bid x 3 days.  -may have had before  -area healing   12. "Apneic" episodes----sats above 96% throughout the night---dc'ed pulse oximetry    LOS (Days) 7 A FACE TO FACE EVALUATION WAS PERFORMED  SWARTZ,ZACHARY T 12/04/2012 8:24 AM

## 2012-12-04 NOTE — Progress Notes (Signed)
Patient discharge to home at 11:55. Discharge instructions and prescriptions provided by Marissa Nestle, PA. Patient verbalized understanding. [name]

## 2012-12-04 NOTE — Progress Notes (Signed)
Speech Language Pathology Discharge Summary  Patient Details  Name: [name] MRN: [id number] Date of Birth: 10/20/1930  Today's Date: 12/04/2012  Patient has met 5 of 5 long term goals.  Patient to discharge at overall Modified Independent level.   Reasons goals not met: N/A   Clinical Impression/Discharge Summary: Pt has made great functional gains and has met all LTG's this admission. Currently, pt is demonstrating behaviors consistent with a Rancho Level VIII and is overall Mod I for functional problem solving, anticipatory awareness, divided attention and working memory. Pt education complete and pt will discharge home. No skilled SLP f/u is needed at this time due to pt at cognitive baseline level of functioning.    Recommendation:  None      Equipment: N/A   Reasons for discharge: Treatment goals met;Discharged from hospital   Patient/Family Agrees with Progress Made and Goals Achieved: Yes   See FIM for current functional status  Siobhan Zaro 12/04/2012, 7:30 AM

## 2012-12-04 NOTE — Progress Notes (Signed)
Physical Therapy Discharge Summary  Patient Details  Name: [name] MRN: [id number] Date of Birth: 1931-07-25  Today's Date: 12/04/2012  Patient has met 8 of 8 long term goals due to improved activity tolerance, improved balance, increased strength, ability to compensate for deficits, improved awareness and improved coordination.  Patient to discharge at an ambulatory level Modified Independent with RW.  Reasons goals not met: n/a  Recommendation:  Patient will benefit from ongoing skilled PT services in home health setting to continue to advance safe functional mobility, address ongoing impairments in balance, gait, and minimize fall risk.  Equipment: RW  Reasons for discharge: treatment goals met and discharge from hospital  Patient/family agrees with progress made and goals achieved: Yes  PT Discharge  Cognition Overall Cognitive Status: Within Functional Limits for tasks assessed Orientation Level: Oriented X4 Sensation Sensation Light Touch: Appears Intact Proprioception: Appears Intact Coordination Gross Motor Movements are Fluid and Coordinated: Yes Motor  Motor Motor: Within Functional Limits   Trunk/Postural Assessment  Cervical Assessment Cervical Assessment: Within Functional Limits Thoracic Assessment Thoracic Assessment: Within Functional Limits Lumbar Assessment Lumbar Assessment: Within Functional Limits Postural Control Postural Control: Within Functional Limits  Balance Static Standing Balance Static Standing - Level of Assistance: 6: Modified independent (Device/Increase time) Dynamic Standing Balance Dynamic Standing - Level of Assistance: 6: Modified independent (Device/Increase time) Extremity Assessment      RLE Assessment RLE Assessment: Within Functional Limits LLE Assessment LLE Assessment: Within Functional Limits  See FIM for current functional status  [name] 12/04/2012, 2:19 PM

## 2012-12-05 DIAGNOSIS — S069X9A Unspecified intracranial injury with loss of consciousness of unspecified duration, initial encounter: Secondary | ICD-10-CM | POA: Diagnosis not present

## 2012-12-05 DIAGNOSIS — G40802 Other epilepsy, not intractable, without status epilepticus: Secondary | ICD-10-CM | POA: Diagnosis not present

## 2012-12-05 DIAGNOSIS — R269 Unspecified abnormalities of gait and mobility: Secondary | ICD-10-CM | POA: Diagnosis not present

## 2012-12-05 DIAGNOSIS — F319 Bipolar disorder, unspecified: Secondary | ICD-10-CM | POA: Diagnosis not present

## 2012-12-05 DIAGNOSIS — M545 Low back pain: Secondary | ICD-10-CM | POA: Diagnosis not present

## 2012-12-08 DIAGNOSIS — F319 Bipolar disorder, unspecified: Secondary | ICD-10-CM | POA: Diagnosis not present

## 2012-12-08 DIAGNOSIS — G40802 Other epilepsy, not intractable, without status epilepticus: Secondary | ICD-10-CM | POA: Diagnosis not present

## 2012-12-08 DIAGNOSIS — R269 Unspecified abnormalities of gait and mobility: Secondary | ICD-10-CM | POA: Diagnosis not present

## 2012-12-08 DIAGNOSIS — M545 Low back pain: Secondary | ICD-10-CM | POA: Diagnosis not present

## 2012-12-08 DIAGNOSIS — S069X9A Unspecified intracranial injury with loss of consciousness of unspecified duration, initial encounter: Secondary | ICD-10-CM | POA: Diagnosis not present

## 2012-12-09 DIAGNOSIS — S069X9A Unspecified intracranial injury with loss of consciousness of unspecified duration, initial encounter: Secondary | ICD-10-CM | POA: Diagnosis not present

## 2012-12-09 DIAGNOSIS — G40802 Other epilepsy, not intractable, without status epilepticus: Secondary | ICD-10-CM | POA: Diagnosis not present

## 2012-12-09 DIAGNOSIS — M545 Low back pain: Secondary | ICD-10-CM | POA: Diagnosis not present

## 2012-12-09 DIAGNOSIS — F319 Bipolar disorder, unspecified: Secondary | ICD-10-CM | POA: Diagnosis not present

## 2012-12-09 DIAGNOSIS — R269 Unspecified abnormalities of gait and mobility: Secondary | ICD-10-CM | POA: Diagnosis not present

## 2012-12-10 DIAGNOSIS — F319 Bipolar disorder, unspecified: Secondary | ICD-10-CM | POA: Diagnosis not present

## 2012-12-10 DIAGNOSIS — S069X9A Unspecified intracranial injury with loss of consciousness of unspecified duration, initial encounter: Secondary | ICD-10-CM | POA: Diagnosis not present

## 2012-12-10 DIAGNOSIS — M545 Low back pain: Secondary | ICD-10-CM | POA: Diagnosis not present

## 2012-12-10 DIAGNOSIS — R269 Unspecified abnormalities of gait and mobility: Secondary | ICD-10-CM | POA: Diagnosis not present

## 2012-12-10 DIAGNOSIS — G40802 Other epilepsy, not intractable, without status epilepticus: Secondary | ICD-10-CM | POA: Diagnosis not present

## 2012-12-11 ENCOUNTER — Encounter: Payer: Self-pay | Admitting: Family Medicine

## 2012-12-11 ENCOUNTER — Ambulatory Visit (INDEPENDENT_AMBULATORY_CARE_PROVIDER_SITE_OTHER): Payer: Self-pay | Admitting: Family Medicine

## 2012-12-11 VITALS — BP 133/51 | HR 60 | Temp 97.6°F | Ht 64.0 in | Wt 217.0 lb

## 2012-12-11 DIAGNOSIS — G40802 Other epilepsy, not intractable, without status epilepticus: Secondary | ICD-10-CM | POA: Diagnosis not present

## 2012-12-11 DIAGNOSIS — R300 Dysuria: Secondary | ICD-10-CM | POA: Insufficient documentation

## 2012-12-11 DIAGNOSIS — M545 Low back pain: Secondary | ICD-10-CM | POA: Diagnosis not present

## 2012-12-11 DIAGNOSIS — F319 Bipolar disorder, unspecified: Secondary | ICD-10-CM | POA: Diagnosis not present

## 2012-12-11 DIAGNOSIS — S069X9A Unspecified intracranial injury with loss of consciousness of unspecified duration, initial encounter: Secondary | ICD-10-CM | POA: Diagnosis not present

## 2012-12-11 DIAGNOSIS — R269 Unspecified abnormalities of gait and mobility: Secondary | ICD-10-CM | POA: Diagnosis not present

## 2012-12-11 DIAGNOSIS — M1990 Unspecified osteoarthritis, unspecified site: Secondary | ICD-10-CM

## 2012-12-11 DIAGNOSIS — IMO0002 Reserved for concepts with insufficient information to code with codable children: Secondary | ICD-10-CM

## 2012-12-11 LAB — POCT URINALYSIS DIPSTICK
Blood, UA: NEGATIVE
Glucose, UA: NEGATIVE
Nitrite, UA: NEGATIVE
Protein, UA: NEGATIVE
Spec Grav, UA: 1.015
Urobilinogen, UA: 0.2

## 2012-12-11 MED ORDER — FLUCONAZOLE 150 MG PO TABS: 150.0000 mg | ORAL_TABLET | Freq: Once | ORAL | Status: DC

## 2012-12-11 MED ORDER — TRAMADOL HCL 50 MG PO TABS: 50.0000 mg | ORAL_TABLET | Freq: Three times a day (TID) | ORAL | Status: DC | PRN
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
## 2012-12-11 NOTE — Assessment & Plan Note (Signed)
No evidence of UTI.  Will Rx as yeast vaginitis.

## 2012-12-11 NOTE — Assessment & Plan Note (Signed)
Recovering nicely.  I doubt that she will have any permanent sequela.
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
## 2012-12-11 NOTE — Assessment & Plan Note (Signed)
Mood is great now.  May need to decrease or stop adderall - await neuro opinion.

## 2012-12-15 DIAGNOSIS — G40802 Other epilepsy, not intractable, without status epilepticus: Secondary | ICD-10-CM | POA: Diagnosis not present

## 2012-12-15 DIAGNOSIS — S069X9A Unspecified intracranial injury with loss of consciousness of unspecified duration, initial encounter: Secondary | ICD-10-CM | POA: Diagnosis not present

## 2012-12-15 DIAGNOSIS — R269 Unspecified abnormalities of gait and mobility: Secondary | ICD-10-CM | POA: Diagnosis not present

## 2012-12-15 DIAGNOSIS — F319 Bipolar disorder, unspecified: Secondary | ICD-10-CM | POA: Diagnosis not present

## 2012-12-15 DIAGNOSIS — M545 Low back pain: Secondary | ICD-10-CM | POA: Diagnosis not present

## 2012-12-16 DIAGNOSIS — F319 Bipolar disorder, unspecified: Secondary | ICD-10-CM | POA: Diagnosis not present

## 2012-12-16 DIAGNOSIS — R269 Unspecified abnormalities of gait and mobility: Secondary | ICD-10-CM | POA: Diagnosis not present

## 2012-12-16 DIAGNOSIS — G40802 Other epilepsy, not intractable, without status epilepticus: Secondary | ICD-10-CM | POA: Diagnosis not present

## 2012-12-16 DIAGNOSIS — M545 Low back pain: Secondary | ICD-10-CM | POA: Diagnosis not present

## 2012-12-16 DIAGNOSIS — S069X9A Unspecified intracranial injury with loss of consciousness of unspecified duration, initial encounter: Secondary | ICD-10-CM | POA: Diagnosis not present

## 2012-12-18 DIAGNOSIS — S069X9A Unspecified intracranial injury with loss of consciousness of unspecified duration, initial encounter: Secondary | ICD-10-CM | POA: Diagnosis not present

## 2012-12-18 DIAGNOSIS — G40802 Other epilepsy, not intractable, without status epilepticus: Secondary | ICD-10-CM | POA: Diagnosis not present

## 2012-12-18 DIAGNOSIS — M545 Low back pain: Secondary | ICD-10-CM | POA: Diagnosis not present

## 2012-12-18 DIAGNOSIS — R269 Unspecified abnormalities of gait and mobility: Secondary | ICD-10-CM | POA: Diagnosis not present

## 2012-12-18 DIAGNOSIS — F319 Bipolar disorder, unspecified: Secondary | ICD-10-CM | POA: Diagnosis not present

## 2012-12-21 ENCOUNTER — Encounter: Payer: Medicare Other | Attending: Physical Medicine & Rehabilitation | Admitting: Physical Medicine & Rehabilitation

## 2012-12-21 ENCOUNTER — Encounter: Payer: Self-pay | Admitting: Physical Medicine & Rehabilitation

## 2012-12-21 VITALS — BP 130/80 | HR 60 | Resp 16 | Ht 64.0 in | Wt 217.0 lb

## 2012-12-21 DIAGNOSIS — F319 Bipolar disorder, unspecified: Secondary | ICD-10-CM | POA: Diagnosis not present

## 2012-12-21 DIAGNOSIS — G40909 Epilepsy, unspecified, not intractable, without status epilepticus: Secondary | ICD-10-CM | POA: Insufficient documentation

## 2012-12-21 DIAGNOSIS — I10 Essential (primary) hypertension: Secondary | ICD-10-CM | POA: Diagnosis not present

## 2012-12-21 DIAGNOSIS — Z79899 Other long term (current) drug therapy: Secondary | ICD-10-CM | POA: Insufficient documentation

## 2012-12-21 DIAGNOSIS — E785 Hyperlipidemia, unspecified: Secondary | ICD-10-CM | POA: Diagnosis not present

## 2012-12-21 DIAGNOSIS — Z5189 Encounter for other specified aftercare: Secondary | ICD-10-CM | POA: Diagnosis not present

## 2012-12-21 DIAGNOSIS — R569 Unspecified convulsions: Secondary | ICD-10-CM

## 2012-12-21 DIAGNOSIS — M159 Polyosteoarthritis, unspecified: Secondary | ICD-10-CM | POA: Diagnosis not present

## 2012-12-21 DIAGNOSIS — Z791 Long term (current) use of non-steroidal anti-inflammatories (NSAID): Secondary | ICD-10-CM | POA: Insufficient documentation

## 2012-12-21 DIAGNOSIS — S060X0D Concussion without loss of consciousness, subsequent encounter: Secondary | ICD-10-CM
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
## 2012-12-21 NOTE — Patient Instructions (Signed)
BE REALISTIC ABOUT YOUR ACTIVITIES. TAKE A BREAK WHEN YOU CAN. DON'T OVER DO THINGS!!!!   CALL ME WITH ANY PROBLEMS OR QUESTIONS (#[phone]).  HAVE A GOOD DAY   I THINK IT'S OK FOR YOU TO USE IBUPROFEN 200MG  TWICE DAILY WITH FOOD.!!!! WATCH FOR INCREASE IN REFLUX OR STOMACH UPSET.

## 2012-12-22 ENCOUNTER — Encounter: Payer: Self-pay | Admitting: Neurology

## 2012-12-22 ENCOUNTER — Ambulatory Visit (INDEPENDENT_AMBULATORY_CARE_PROVIDER_SITE_OTHER): Payer: Medicare Other | Admitting: Neurology

## 2012-12-22 VITALS — BP 160/70 | HR 58 | Ht 64.5 in | Wt 214.0 lb

## 2012-12-22 DIAGNOSIS — R569 Unspecified convulsions: Secondary | ICD-10-CM | POA: Diagnosis not present
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
## 2012-12-23 ENCOUNTER — Telehealth: Payer: Self-pay | Admitting: Neurology

## 2012-12-23 ENCOUNTER — Ambulatory Visit (INDEPENDENT_AMBULATORY_CARE_PROVIDER_SITE_OTHER): Payer: Medicare Other | Admitting: Radiology

## 2012-12-23 DIAGNOSIS — R569 Unspecified convulsions: Secondary | ICD-10-CM | POA: Diagnosis not present
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
## 2012-12-28 ENCOUNTER — Telehealth: Payer: Self-pay | Admitting: Family Medicine

## 2012-12-28 ENCOUNTER — Telehealth: Payer: Self-pay | Admitting: *Deleted

## 2012-12-28 MED ORDER — METOPROLOL SUCCINATE ER 25 MG PO TB24: 25.0000 mg | ORAL_TABLET | Freq: Every day | ORAL | Status: DC

## 2012-12-28 MED ORDER — HYDROCHLOROTHIAZIDE 12.5 MG PO CAPS: 12.5000 mg | ORAL_CAPSULE | Freq: Every day | ORAL | Status: DC

## 2012-12-28 NOTE — Telephone Encounter (Signed)
Pt is out of meds that she got from hospital and needs these called in:   [facility] [address] HCTZ Metoprolol 25mg   FYI she is taking Tylenol 1-2 daily and wants to let Dr Leveda Anna know that she was told not to take Tramadol for someone with hx of seizures.

## 2012-12-28 NOTE — Telephone Encounter (Signed)
Called patient and clarified that she wanted refill of metoprolol and HCTZ.  Refills sent.

## 2012-12-28 NOTE — Telephone Encounter (Signed)
Patient called stating she hasn't had a seizure and she thinks her Depakote medication needs to be reduced because of  dizziness especially at night. Patient stated 250 mg during the day was fine.

## 2012-12-28 NOTE — Telephone Encounter (Signed)
Will FWD to Dr. Leveda Anna.  Emilie Rutter, Darlyne Russian

## 2012-12-28 NOTE — Telephone Encounter (Signed)
I called patient. The patient is having excessive drowsiness and some dizziness on the Depakote. The patient will take the 250 and the 500 mg tablets at nighttime, as they are extended release. If the drowsiness continues, we will cut back to just 500 mg at night. The patient has been on Depakote in the distant past for her seizures, and she seemed to tolerated at that time.

## 2012-12-29 DIAGNOSIS — M545 Low back pain: Secondary | ICD-10-CM | POA: Diagnosis not present

## 2012-12-29 DIAGNOSIS — F319 Bipolar disorder, unspecified: Secondary | ICD-10-CM | POA: Diagnosis not present

## 2012-12-29 DIAGNOSIS — G40802 Other epilepsy, not intractable, without status epilepticus: Secondary | ICD-10-CM | POA: Diagnosis not present

## 2012-12-29 DIAGNOSIS — R269 Unspecified abnormalities of gait and mobility: Secondary | ICD-10-CM | POA: Diagnosis not present

## 2012-12-29 DIAGNOSIS — S069X9A Unspecified intracranial injury with loss of consciousness of unspecified duration, initial encounter: Secondary | ICD-10-CM | POA: Diagnosis not present

## 2012-12-30 DIAGNOSIS — F319 Bipolar disorder, unspecified: Secondary | ICD-10-CM | POA: Diagnosis not present

## 2012-12-30 DIAGNOSIS — S069X9A Unspecified intracranial injury with loss of consciousness of unspecified duration, initial encounter: Secondary | ICD-10-CM | POA: Diagnosis not present

## 2012-12-30 DIAGNOSIS — R269 Unspecified abnormalities of gait and mobility: Secondary | ICD-10-CM | POA: Diagnosis not present

## 2012-12-30 DIAGNOSIS — M545 Low back pain: Secondary | ICD-10-CM | POA: Diagnosis not present

## 2012-12-30 DIAGNOSIS — G40802 Other epilepsy, not intractable, without status epilepticus: Secondary | ICD-10-CM | POA: Diagnosis not present

## 2013-01-01 ENCOUNTER — Other Ambulatory Visit: Payer: Self-pay | Admitting: Family Medicine

## 2013-01-01 ENCOUNTER — Telehealth: Payer: Self-pay | Admitting: *Deleted

## 2013-01-01 DIAGNOSIS — S069X9A Unspecified intracranial injury with loss of consciousness of unspecified duration, initial encounter: Secondary | ICD-10-CM | POA: Diagnosis not present

## 2013-01-01 DIAGNOSIS — G40802 Other epilepsy, not intractable, without status epilepticus: Secondary | ICD-10-CM | POA: Diagnosis not present

## 2013-01-01 DIAGNOSIS — M545 Low back pain: Secondary | ICD-10-CM | POA: Diagnosis not present

## 2013-01-01 DIAGNOSIS — R269 Unspecified abnormalities of gait and mobility: Secondary | ICD-10-CM | POA: Diagnosis not present

## 2013-01-01 DIAGNOSIS — F319 Bipolar disorder, unspecified: Secondary | ICD-10-CM | POA: Diagnosis not present

## 2013-01-01 NOTE — Telephone Encounter (Signed)
Spoke with patient and informed her that I was sending in rx that were prescribed by dr. Leveda Anna on 12/28/2012 again due to them being ordered as printed and not sent in

## 2013-01-05 DIAGNOSIS — R269 Unspecified abnormalities of gait and mobility: Secondary | ICD-10-CM | POA: Diagnosis not present

## 2013-01-05 DIAGNOSIS — S069X9A Unspecified intracranial injury with loss of consciousness of unspecified duration, initial encounter: Secondary | ICD-10-CM | POA: Diagnosis not present

## 2013-01-05 DIAGNOSIS — G40802 Other epilepsy, not intractable, without status epilepticus: Secondary | ICD-10-CM | POA: Diagnosis not present

## 2013-01-05 DIAGNOSIS — F319 Bipolar disorder, unspecified: Secondary | ICD-10-CM | POA: Diagnosis not present

## 2013-01-05 DIAGNOSIS — M545 Low back pain: Secondary | ICD-10-CM | POA: Diagnosis not present

## 2013-01-07 DIAGNOSIS — R269 Unspecified abnormalities of gait and mobility: Secondary | ICD-10-CM | POA: Diagnosis not present

## 2013-01-07 DIAGNOSIS — G40802 Other epilepsy, not intractable, without status epilepticus: Secondary | ICD-10-CM | POA: Diagnosis not present

## 2013-01-07 DIAGNOSIS — F319 Bipolar disorder, unspecified: Secondary | ICD-10-CM | POA: Diagnosis not present

## 2013-01-07 DIAGNOSIS — S069X9A Unspecified intracranial injury with loss of consciousness of unspecified duration, initial encounter: Secondary | ICD-10-CM | POA: Diagnosis not present

## 2013-01-07 DIAGNOSIS — M545 Low back pain: Secondary | ICD-10-CM | POA: Diagnosis not present

## 2013-01-10 ENCOUNTER — Other Ambulatory Visit: Payer: Self-pay | Admitting: Family Medicine

## 2013-01-11 ENCOUNTER — Telehealth: Payer: Self-pay | Admitting: Family Medicine

## 2013-01-11 MED ORDER — FLUOXETINE HCL 40 MG PO CAPS: 40.0000 mg | ORAL_CAPSULE | Freq: Every day | ORAL | Status: DC

## 2013-01-11 MED ORDER — CELECOXIB 200 MG PO CAPS: 200.0000 mg | ORAL_CAPSULE | Freq: Every day | ORAL | Status: DC

## 2013-01-11 NOTE — Telephone Encounter (Signed)
Fwd to MD.  Radene Ou, CMA

## 2013-01-11 NOTE — Telephone Encounter (Signed)
Pt needs medication for pain. Found a old prescription for Celebrex. It has been helping with the pain. She wants the rx called into to [facility] at [place]. She says she is not coming in.  She cant drive Please advise.

## 2013-01-11 NOTE — Telephone Encounter (Signed)
Called and discussed.  Patient gets great relief from celebrex.  She is aware of slight Increase risk of CAD.  She is on ASA, lipitor and has a very healthy diet.  We concluded OK to use celebrex.

## 2013-01-12 DIAGNOSIS — F319 Bipolar disorder, unspecified: Secondary | ICD-10-CM | POA: Diagnosis not present

## 2013-01-12 DIAGNOSIS — R269 Unspecified abnormalities of gait and mobility: Secondary | ICD-10-CM | POA: Diagnosis not present

## 2013-01-12 DIAGNOSIS — G40802 Other epilepsy, not intractable, without status epilepticus: Secondary | ICD-10-CM | POA: Diagnosis not present

## 2013-01-12 DIAGNOSIS — S069X9A Unspecified intracranial injury with loss of consciousness of unspecified duration, initial encounter: Secondary | ICD-10-CM | POA: Diagnosis not present

## 2013-01-12 DIAGNOSIS — M545 Low back pain: Secondary | ICD-10-CM | POA: Diagnosis not present

## 2013-01-15 DIAGNOSIS — F319 Bipolar disorder, unspecified: Secondary | ICD-10-CM | POA: Diagnosis not present

## 2013-01-15 DIAGNOSIS — R269 Unspecified abnormalities of gait and mobility: Secondary | ICD-10-CM | POA: Diagnosis not present

## 2013-01-15 DIAGNOSIS — M545 Low back pain: Secondary | ICD-10-CM | POA: Diagnosis not present

## 2013-01-15 DIAGNOSIS — G40802 Other epilepsy, not intractable, without status epilepticus: Secondary | ICD-10-CM | POA: Diagnosis not present

## 2013-01-15 DIAGNOSIS — S069X9A Unspecified intracranial injury with loss of consciousness of unspecified duration, initial encounter: Secondary | ICD-10-CM | POA: Diagnosis not present

## 2013-01-25 ENCOUNTER — Telehealth: Payer: Self-pay | Admitting: Family Medicine
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
## 2013-01-27 ENCOUNTER — Telehealth: Payer: Self-pay | Admitting: *Deleted

## 2013-01-27 MED ORDER — MECLIZINE HCL 25 MG PO CHEW: 12.5000 mg | CHEWABLE_TABLET | Freq: Three times a day (TID) | ORAL | Status: DC

## 2013-01-27 NOTE — Telephone Encounter (Signed)
Has recently finished PT

## 2013-01-27 NOTE — Telephone Encounter (Signed)
Spoke with patient and she has had no improvements in gait and dizziness since being in hospital,blood pressure today is 143/83, was higher at 159/95, wants to speak with Dr Anne Hahn, also has started balance classes per [place].

## 2013-01-27 NOTE — Telephone Encounter (Signed)
I called patient. The patient is having some neck discomfort, CT of the cervical spine showed severe multilevel degenerative arthritis. The patient has undergone some vestibular therapy for her dizziness without benefit. The vertigo is positional, occurring when she lies down. I will give a trial on meclizine. The patient reports gait instability, and she uses a cane for ambulation.

## 2013-01-27 NOTE — Telephone Encounter (Signed)
Message copied by Monico Blitz on Wed Jan 27, 2013 11:35 AM ------      Message from: Seth Bake      Created: Wed Jan 27, 2013  9:41 AM      Contact: patient       Bouncing off to the right when she walks, feeling dizzy, and her BP in elevated. Very concerned.  Please call. ------

## 2013-02-01 ENCOUNTER — Other Ambulatory Visit: Payer: Self-pay | Admitting: Family Medicine

## 2013-02-01 ENCOUNTER — Other Ambulatory Visit: Payer: Self-pay | Admitting: Neurology

## 2013-02-01 ENCOUNTER — Other Ambulatory Visit: Payer: Self-pay | Admitting: *Deleted

## 2013-02-01 MED ORDER — HYDROCHLOROTHIAZIDE 12.5 MG PO CAPS: 12.5000 mg | ORAL_CAPSULE | Freq: Every day | ORAL | Status: DC

## 2013-02-24 ENCOUNTER — Telehealth: Payer: Self-pay | Admitting: Neurology

## 2013-02-26 NOTE — Telephone Encounter (Signed)
I called pt and she is very concerned about her her losing her hair.  It is dropping off by the handfuls she states.   She stopped taking her depakote 250mg  at qhs.  She is still on the 500mg  in am.  I instructed that I would maintain dosing as per recommended by Dr. Anne Hahn and then when speaks with him about this, he may change this.  I told her that EEG shows irritability, and lowers threshold for seizures.   She understood this and may or may not do this.

## 2013-02-27 NOTE — Telephone Encounter (Signed)
Called patient. The patient is having some hair loss on the Depakote. She has cut back to 500 mg daily. She has not had any further seizures. I have indicated that this is a side effect of the Depakote, and the hair loss is transient. The patient is to go on a multivitamin that contains zinc and selenium. This will help minimize the transient hair loss.

## 2013-03-05 ENCOUNTER — Telehealth: Payer: Self-pay | Admitting: Family Medicine
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
## 2013-03-05 NOTE — Telephone Encounter (Signed)
Will fwd to MD.  Geniece Akers L, CMA  

## 2013-03-08 ENCOUNTER — Telehealth: Payer: Self-pay | Admitting: Family Medicine

## 2013-03-08 ENCOUNTER — Encounter: Payer: Self-pay | Admitting: Home Health Services

## 2013-03-08 DIAGNOSIS — F988 Other specified behavioral and emotional disorders with onset usually occurring in childhood and adolescence: Secondary | ICD-10-CM

## 2013-03-08 MED ORDER — AMPHETAMINE-DEXTROAMPHETAMINE 10 MG PO TABS: 10.0000 mg | ORAL_TABLET | Freq: Every day | ORAL | Status: DC

## 2013-03-08 NOTE — Telephone Encounter (Signed)
handled

## 2013-03-08 NOTE — Telephone Encounter (Signed)
Patient is inquiring about the Adderall refill

## 2013-03-08 NOTE — Telephone Encounter (Signed)
Pt checking the status of her refill request JW

## 2013-03-08 NOTE — Telephone Encounter (Signed)
Will refill.

## 2013-03-11 DIAGNOSIS — Z1231 Encounter for screening mammogram for malignant neoplasm of breast: Secondary | ICD-10-CM | POA: Diagnosis not present

## 2013-03-12 ENCOUNTER — Encounter: Payer: Self-pay | Admitting: Family Medicine

## 2013-03-12 ENCOUNTER — Ambulatory Visit (INDEPENDENT_AMBULATORY_CARE_PROVIDER_SITE_OTHER): Payer: Medicare Other | Admitting: Family Medicine

## 2013-03-12 VITALS — BP 152/70 | HR 57 | Temp 98.0°F | Ht 64.0 in | Wt 214.4 lb

## 2013-03-12 DIAGNOSIS — F319 Bipolar disorder, unspecified: Secondary | ICD-10-CM

## 2013-03-12 DIAGNOSIS — R569 Unspecified convulsions: Secondary | ICD-10-CM

## 2013-03-12 MED ORDER — LEVETIRACETAM 500 MG PO TABS: 500.0000 mg | ORAL_TABLET | Freq: Two times a day (BID) | ORAL | Status: DC

## 2013-03-12 NOTE — Assessment & Plan Note (Signed)
She insists on stopping depakote.  Will start Keppra.  Given that she has a remote hx of seizures plus recent head trauma, I do not want to stop anticonvulsants altogether.

## 2013-03-12 NOTE — Patient Instructions (Addendum)
Switch to Keppra 1 pill twice a day for 2 weeks 2 pills twice a day for 2 weeks Then 3 pills twice a day thereafter.
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
## 2013-03-12 NOTE — Assessment & Plan Note (Signed)
Stable.  Depakote will be stopped, watch for mania.

## 2013-03-15 ENCOUNTER — Encounter: Payer: Self-pay | Admitting: Family Medicine

## 2013-03-15 NOTE — Progress Notes (Signed)
Patient ID: [name], female   DOB: May 17, 1931, 79 y.o.   MRN: [id number] Received copy of mammogram from 03/12/2013 birads 1

## 2013-03-19 ENCOUNTER — Ambulatory Visit: Payer: Self-pay | Admitting: Family Medicine

## 2013-04-01 ENCOUNTER — Telehealth: Payer: Self-pay | Admitting: Family Medicine

## 2013-04-01 NOTE — Telephone Encounter (Signed)
Please advise - see note below Elizabeth Selassie Spatafore, RN-BSN  

## 2013-04-01 NOTE — Telephone Encounter (Signed)
Pt is calling for a referral for ENT. She feels that something is still in her ear. She also can not hear as well in that ear. JW

## 2013-04-02 NOTE — Telephone Encounter (Signed)
[name] I see nothing in his last note abouot an ear problem--so if it is "still"  bothering her, she needs to be seen OR wait until Dr. h gets back (Sept 1) THANKS! Denny Levy

## 2013-04-13 ENCOUNTER — Other Ambulatory Visit: Payer: Self-pay | Admitting: Family Medicine

## 2013-04-13 DIAGNOSIS — R569 Unspecified convulsions: Secondary | ICD-10-CM

## 2013-04-13 DIAGNOSIS — H9192 Unspecified hearing loss, left ear: Secondary | ICD-10-CM

## 2013-04-13 NOTE — Assessment & Plan Note (Signed)
Patient states she has stopped keppra on her own.  "I don't need that expensive medicine."  And I could not change her mind.

## 2013-04-16 DIAGNOSIS — H8110 Benign paroxysmal vertigo, unspecified ear: Secondary | ICD-10-CM | POA: Diagnosis not present

## 2013-04-16 DIAGNOSIS — H903 Sensorineural hearing loss, bilateral: Secondary | ICD-10-CM | POA: Diagnosis not present

## 2013-04-16 DIAGNOSIS — H905 Unspecified sensorineural hearing loss: Secondary | ICD-10-CM | POA: Diagnosis not present

## 2013-04-20 ENCOUNTER — Encounter: Payer: Self-pay | Admitting: Family Medicine

## 2013-04-20 ENCOUNTER — Telehealth: Payer: Self-pay | Admitting: Family Medicine

## 2013-04-20 NOTE — Telephone Encounter (Signed)
Would like to know who she needs to go to for her vestibular rehab clinic Also have her tests results been received> Please advise

## 2013-04-20 NOTE — Telephone Encounter (Signed)
Seen by Gean Quint - I have not yet seen his consult.  She reportedly has age related hearing loss.  Also has BPPH.  Will set up rehab through ENT. Also going to neuro and will inform them that she is no longer taking any seiaure meds - Off meds on her own x 1 week.  No seizures.

## 2013-04-21 ENCOUNTER — Telehealth: Payer: Self-pay | Admitting: *Deleted

## 2013-04-21 NOTE — Telephone Encounter (Signed)
Spoke to patient to confirm appointment for 04-22-13.

## 2013-04-22 ENCOUNTER — Encounter: Payer: Self-pay | Admitting: Nurse Practitioner

## 2013-04-22 ENCOUNTER — Ambulatory Visit (INDEPENDENT_AMBULATORY_CARE_PROVIDER_SITE_OTHER): Payer: Medicare Other | Admitting: Nurse Practitioner

## 2013-04-22 VITALS — BP 140/82 | HR 67 | Wt 216.0 lb

## 2013-04-22 DIAGNOSIS — R569 Unspecified convulsions: Secondary | ICD-10-CM
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
## 2013-04-22 NOTE — Patient Instructions (Addendum)
Pt is not to drive until October Pt is not on seizure medication at this time Call for any further seizure activity F/U prn

## 2013-04-22 NOTE — Progress Notes (Signed)
I have read the note, and I agree with the clinical assessment and plan.  Fergie Sherbert KEITH   

## 2013-04-26 ENCOUNTER — Other Ambulatory Visit: Payer: Self-pay | Admitting: Family Medicine

## 2013-04-26 ENCOUNTER — Ambulatory Visit (INDEPENDENT_AMBULATORY_CARE_PROVIDER_SITE_OTHER): Payer: Medicare Other | Admitting: *Deleted

## 2013-04-26 DIAGNOSIS — Z23 Encounter for immunization: Secondary | ICD-10-CM

## 2013-04-26 MED ORDER — HYDROCHLOROTHIAZIDE 12.5 MG PO CAPS: 12.5000 mg | ORAL_CAPSULE | Freq: Every day | ORAL | Status: DC

## 2013-04-26 NOTE — Progress Notes (Signed)
Pt here today for nurse visit to receive flu vaccine.  Patient also wants to know if Dr. Leveda Anna has received any info from Dr. Raye Sorrow office regarding her ENT exam on 04/16/13.  Will route note to Dr. Leveda Anna.  Gaylene Brooks, RN

## 2013-04-28 ENCOUNTER — Ambulatory Visit: Payer: Medicare Other | Attending: Otolaryngology | Admitting: Rehabilitative and Restorative Service Providers"

## 2013-04-28 DIAGNOSIS — IMO0001 Reserved for inherently not codable concepts without codable children: Secondary | ICD-10-CM | POA: Insufficient documentation

## 2013-04-28 DIAGNOSIS — M25539 Pain in unspecified wrist: Secondary | ICD-10-CM | POA: Insufficient documentation

## 2013-04-28 DIAGNOSIS — R269 Unspecified abnormalities of gait and mobility: Secondary | ICD-10-CM | POA: Diagnosis not present

## 2013-04-28 DIAGNOSIS — R42 Dizziness and giddiness: Secondary | ICD-10-CM | POA: Diagnosis not present

## 2013-04-28 DIAGNOSIS — M542 Cervicalgia: Secondary | ICD-10-CM | POA: Insufficient documentation

## 2013-04-28 NOTE — Progress Notes (Signed)
Patient ID: [name], female   DOB: 01-13-1931, 79 y.o.   MRN: [id number] Called and told patient that ENT consult is not yet in EMR.  Either we have not received or it is in the process of being scanned in (I do not recall reviewing it.)

## 2013-05-04 ENCOUNTER — Telehealth: Payer: Self-pay | Admitting: Family Medicine

## 2013-05-04 NOTE — Telephone Encounter (Signed)
Told to take the metoprolol.  It is unlikely to cause heart fluttering - in fact it may help.  The metoprolol will help with the high blood pressure.  She is to see me if the heart fluttering persists.

## 2013-05-04 NOTE — Telephone Encounter (Signed)
Pt called because she is having a reaction to the metoprolol, She is having heart flutters at night and also wakes up with high BP. Today it was 148/91. She changed the dosage from 25 mg to 12.5 mg by cutting in half and she still has the heart flutter's and high BP. SHe overall feels good but would still like this addressed. JW

## 2013-05-04 NOTE — Telephone Encounter (Signed)
Patient was instructed to stop medical, once advised by Dr Leveda Anna I would call her back with new instructions,she voiced understanding.Marland Kitchen [name], [name]

## 2013-05-05 ENCOUNTER — Encounter: Payer: Self-pay | Admitting: Family Medicine

## 2013-05-05 ENCOUNTER — Ambulatory Visit: Payer: Medicare Other | Admitting: Rehabilitative and Restorative Service Providers"

## 2013-05-05 DIAGNOSIS — R42 Dizziness and giddiness: Secondary | ICD-10-CM | POA: Diagnosis not present

## 2013-05-05 DIAGNOSIS — M25539 Pain in unspecified wrist: Secondary | ICD-10-CM | POA: Diagnosis not present

## 2013-05-05 DIAGNOSIS — M542 Cervicalgia: Secondary | ICD-10-CM | POA: Diagnosis not present

## 2013-05-05 DIAGNOSIS — IMO0001 Reserved for inherently not codable concepts without codable children: Secondary | ICD-10-CM | POA: Diagnosis not present

## 2013-05-05 DIAGNOSIS — R269 Unspecified abnormalities of gait and mobility: Secondary | ICD-10-CM | POA: Diagnosis not present

## 2013-05-11 ENCOUNTER — Ambulatory Visit: Payer: Medicare Other | Admitting: Rehabilitative and Restorative Service Providers"

## 2013-05-21 ENCOUNTER — Encounter: Payer: Self-pay | Admitting: Family Medicine

## 2013-05-21 ENCOUNTER — Ambulatory Visit (INDEPENDENT_AMBULATORY_CARE_PROVIDER_SITE_OTHER): Payer: Medicare Other | Admitting: Family Medicine

## 2013-05-21 VITALS — BP 132/82 | HR 67 | Temp 99.2°F | Ht 64.0 in | Wt 216.0 lb

## 2013-05-21 DIAGNOSIS — R569 Unspecified convulsions: Secondary | ICD-10-CM

## 2013-05-21 DIAGNOSIS — I10 Essential (primary) hypertension: Secondary | ICD-10-CM

## 2013-05-21 DIAGNOSIS — F319 Bipolar disorder, unspecified: Secondary | ICD-10-CM | POA: Diagnosis not present

## 2013-05-21 DIAGNOSIS — L538 Other specified erythematous conditions: Secondary | ICD-10-CM

## 2013-05-21 DIAGNOSIS — L304 Erythema intertrigo: Secondary | ICD-10-CM

## 2013-05-21 MED ORDER — MICONAZOLE NITRATE 2 % EX AERP: INHALATION_SPRAY | CUTANEOUS | Status: DC

## 2013-05-21 NOTE — Patient Instructions (Signed)
You look good Stay on your same medications.

## 2013-05-21 NOTE — Assessment & Plan Note (Signed)
Great home BP

## 2013-05-21 NOTE — Progress Notes (Signed)
  Subjective:    Patient ID: [name], female    DOB: 12-21-1930, 79 y.o.   MRN: [id number]  HPI Doing well.  Off all seizure meds.  No seizures.  Back to driving.   Home blood pressures are great.  Taking adderall only intermitantly Wt stable  Spirits are fine.     Review of Systems     Objective:   Physical ExamGen Well appearing Cardiac RRR without m or g Ext no edema.        Assessment & Plan:

## 2013-05-21 NOTE — Assessment & Plan Note (Signed)
Chronic/recurrent under breasts

## 2013-05-21 NOTE — Assessment & Plan Note (Signed)
Great spirits.

## 2013-06-14 ENCOUNTER — Telehealth: Payer: Self-pay | Admitting: Family Medicine

## 2013-06-14 DIAGNOSIS — F988 Other specified behavioral and emotional disorders with onset usually occurring in childhood and adolescence: Secondary | ICD-10-CM

## 2013-06-14 MED ORDER — AMPHETAMINE-DEXTROAMPHETAMINE 10 MG PO TABS: 10.0000 mg | ORAL_TABLET | Freq: Every day | ORAL | Status: DC

## 2013-06-14 NOTE — Telephone Encounter (Signed)
Needs refill on adderall.  °

## 2013-07-20 ENCOUNTER — Telehealth: Payer: Self-pay | Admitting: Family Medicine

## 2013-07-20 DIAGNOSIS — D229 Melanocytic nevi, unspecified: Secondary | ICD-10-CM

## 2013-07-20 NOTE — Assessment & Plan Note (Signed)
Patient called asking for a derm referral because she is concerned about the possibility of a melanoma.  Will authorize referral without seeing taking into account she is a former Engineer, civil (consulting).

## 2013-07-20 NOTE — Telephone Encounter (Signed)
Patient informed.thank you.me

## 2013-07-20 NOTE — Telephone Encounter (Signed)
Please advise. [name] S  

## 2013-07-20 NOTE — Telephone Encounter (Signed)
Patient insist on a ref,she states she know its melanoma.thank you [name], Virgel Bouquet

## 2013-07-20 NOTE — Telephone Encounter (Signed)
I am happy to refer or see myself.  What does she have/need treated?

## 2013-07-20 NOTE — Telephone Encounter (Signed)
Pt needs to see dermatologist.  [name] has retired and the new dr needs a referral [facility] [phone] Please advise

## 2013-07-29 ENCOUNTER — Telehealth: Payer: Self-pay | Admitting: Family Medicine

## 2013-07-29 NOTE — Telephone Encounter (Signed)
Patient calls wanting to know about the status of her dermatology referral.

## 2013-07-30 NOTE — Telephone Encounter (Signed)
Pt is aware about appt inf.  Marines

## 2013-08-13 ENCOUNTER — Other Ambulatory Visit: Payer: Self-pay | Admitting: Dermatology

## 2013-08-13 DIAGNOSIS — Z85828 Personal history of other malignant neoplasm of skin: Secondary | ICD-10-CM | POA: Diagnosis not present

## 2013-08-13 DIAGNOSIS — L821 Other seborrheic keratosis: Secondary | ICD-10-CM | POA: Diagnosis not present

## 2013-08-13 DIAGNOSIS — L570 Actinic keratosis: Secondary | ICD-10-CM | POA: Diagnosis not present

## 2013-08-13 DIAGNOSIS — D485 Neoplasm of uncertain behavior of skin: Secondary | ICD-10-CM | POA: Diagnosis not present

## 2013-08-13 DIAGNOSIS — L905 Scar conditions and fibrosis of skin: Secondary | ICD-10-CM | POA: Diagnosis not present

## 2013-08-20 ENCOUNTER — Other Ambulatory Visit: Payer: Self-pay | Admitting: Family Medicine

## 2013-08-20 MED ORDER — ACYCLOVIR 5 % EX OINT: 1.0000 "application " | TOPICAL_OINTMENT | CUTANEOUS | Status: DC

## 2013-08-23 ENCOUNTER — Telehealth: Payer: Self-pay | Admitting: Family Medicine

## 2013-08-23 NOTE — Telephone Encounter (Signed)
Called and discussed

## 2013-08-23 NOTE — Telephone Encounter (Signed)
Patient calls very weepy, states  that she received news from her dermatologist that she needs to discuss with Dr. Andria Frames. Is asking him to call her ASAP.

## 2013-08-24 DIAGNOSIS — Z85828 Personal history of other malignant neoplasm of skin: Secondary | ICD-10-CM | POA: Diagnosis not present

## 2013-08-24 DIAGNOSIS — L570 Actinic keratosis: Secondary | ICD-10-CM | POA: Diagnosis not present

## 2013-10-13 ENCOUNTER — Telehealth: Payer: Self-pay | Admitting: Family Medicine

## 2013-10-13 ENCOUNTER — Other Ambulatory Visit: Payer: Self-pay | Admitting: Family Medicine

## 2013-10-13 DIAGNOSIS — F988 Other specified behavioral and emotional disorders with onset usually occurring in childhood and adolescence: Secondary | ICD-10-CM

## 2013-10-13 MED ORDER — AMPHETAMINE-DEXTROAMPHETAMINE 10 MG PO TABS: 10.0000 mg | ORAL_TABLET | Freq: Every day | ORAL | Status: DC

## 2013-10-13 NOTE — Telephone Encounter (Signed)
Need refill on generic adderal [facility] [address] "I dont know need an appt.  I am doing fine."

## 2013-10-13 NOTE — Telephone Encounter (Signed)
Told will need an appointment before any more refills.

## 2013-10-21 ENCOUNTER — Other Ambulatory Visit: Payer: Self-pay | Admitting: Family Medicine

## 2013-10-28 NOTE — Telephone Encounter (Signed)
Pt called about the adderall prescription. It was her understanding dr would refill the prescription and she will make an appt Please advise

## 2013-10-29 ENCOUNTER — Ambulatory Visit (INDEPENDENT_AMBULATORY_CARE_PROVIDER_SITE_OTHER): Payer: Medicare Other | Admitting: Family Medicine

## 2013-10-29 ENCOUNTER — Encounter: Payer: Self-pay | Admitting: Family Medicine

## 2013-10-29 ENCOUNTER — Ambulatory Visit: Payer: Self-pay | Admitting: Family Medicine

## 2013-10-29 VITALS — BP 132/75 | HR 61 | Temp 98.8°F | Ht 64.0 in | Wt 224.0 lb

## 2013-10-29 DIAGNOSIS — F319 Bipolar disorder, unspecified: Secondary | ICD-10-CM

## 2013-10-29 DIAGNOSIS — F988 Other specified behavioral and emotional disorders with onset usually occurring in childhood and adolescence: Secondary | ICD-10-CM

## 2013-10-29 DIAGNOSIS — I10 Essential (primary) hypertension: Secondary | ICD-10-CM
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
## 2013-10-29 NOTE — Assessment & Plan Note (Signed)
Stable on current meds 

## 2013-10-29 NOTE — Assessment & Plan Note (Signed)
Refilled adderall.

## 2013-10-29 NOTE — Assessment & Plan Note (Signed)
Good control

## 2013-10-29 NOTE — Patient Instructions (Addendum)
The neck problem is titubation.   Look up tinnitus which is a common problem of ringing in your ears.    Watch your weight, it is creeping up. Stay active. See me every three months.

## 2014-01-18 ENCOUNTER — Other Ambulatory Visit: Payer: Self-pay | Admitting: Family Medicine

## 2014-01-20 ENCOUNTER — Telehealth: Payer: Self-pay | Admitting: Family Medicine

## 2014-01-20 DIAGNOSIS — F988 Other specified behavioral and emotional disorders with onset usually occurring in childhood and adolescence: Secondary | ICD-10-CM

## 2014-01-20 MED ORDER — AMPHETAMINE-DEXTROAMPHETAMINE 10 MG PO TABS: 10.0000 mg | ORAL_TABLET | Freq: Every day | ORAL | Status: DC

## 2014-01-20 NOTE — Telephone Encounter (Signed)
Needs refill on generic on adderal

## 2014-01-20 NOTE — Telephone Encounter (Signed)
Spoke with patient and informed her of below 

## 2014-03-06 ENCOUNTER — Other Ambulatory Visit: Payer: Self-pay | Admitting: Family Medicine

## 2014-03-09 ENCOUNTER — Telehealth: Payer: Self-pay | Admitting: Family Medicine

## 2014-03-09 NOTE — Telephone Encounter (Signed)
Patient needs to know where she had her last colonoscopy done at and what was the date. Please advise.

## 2014-03-09 NOTE — Telephone Encounter (Signed)
Dr Myra Rude looked in her chart and I can't seem to find when she saw a GI doctor.please advise.thank you.[name], [name]

## 2014-03-09 NOTE — Telephone Encounter (Signed)
The only documentation I can find is a lab pathology report of 10/07/2008 showing biopsy results from the colonoscopy.  Unfortunately, the lab report does not indicate who the GI doctor was.  OK to refer her to [facility].  Good because we share same EMR.  Let me know if I need to put in a referral.

## 2014-03-10 NOTE — Telephone Encounter (Signed)
Left detailed message on voicemail  to return mycall if she would like referral  Order or had additional questions or concerns.[name], Lewie Loron

## 2014-03-11 ENCOUNTER — Encounter: Payer: Self-pay | Admitting: Family Medicine

## 2014-03-11 ENCOUNTER — Ambulatory Visit (INDEPENDENT_AMBULATORY_CARE_PROVIDER_SITE_OTHER): Payer: Medicare Other | Admitting: Family Medicine

## 2014-03-11 VITALS — BP 144/73 | HR 64 | Ht 64.0 in | Wt 219.0 lb

## 2014-03-11 DIAGNOSIS — F319 Bipolar disorder, unspecified: Secondary | ICD-10-CM

## 2014-03-11 DIAGNOSIS — I10 Essential (primary) hypertension: Secondary | ICD-10-CM

## 2014-03-11 DIAGNOSIS — E785 Hyperlipidemia, unspecified: Secondary | ICD-10-CM

## 2014-03-11 LAB — LIPID PANEL
CHOL/HDL RATIO: 3.5 ratio
CHOLESTEROL: 128 mg/dL (ref 0–200)
HDL: 37 mg/dL — AB (ref >39)
LDL Cholesterol: 64 mg/dL (ref 0–99)
Triglycerides: 133 mg/dL (ref <150)
VLDL: 27 mg/dL (ref 0–40)

## 2014-03-11 LAB — COMPREHENSIVE METABOLIC PANEL
ALBUMIN: 4.2 g/dL (ref 3.5–5.2)
ALK PHOS: 83 U/L (ref 39–117)
ALT: 12 U/L (ref 0–35)
AST: 16 U/L (ref 0–37)
BUN: 19 mg/dL (ref 6–23)
CALCIUM: 9.7 mg/dL (ref 8.4–10.5)
CHLORIDE: 101 meq/L (ref 96–112)
CO2: 27 mEq/L (ref 19–32)
Creat: 0.73 mg/dL (ref 0.50–1.10)
Glucose, Bld: 98 mg/dL (ref 70–99)
POTASSIUM: 3.8 meq/L (ref 3.5–5.3)
Sodium: 137 mEq/L (ref 135–145)
Total Bilirubin: 0.6 mg/dL (ref 0.2–1.2)
Total Protein: 7 g/dL (ref 6.0–8.3)

## 2014-03-11 LAB — CBC
HCT: 40.9 % (ref 36.0–46.0)
HEMOGLOBIN: 13.8 g/dL (ref 12.0–15.0)
MCH: 29.5 pg (ref 26.0–34.0)
MCHC: 33.7 g/dL (ref 30.0–36.0)
MCV: 87.4 fL (ref 78.0–100.0)
PLATELETS: 276 10*3/uL (ref 150–400)
RBC: 4.68 MIL/uL (ref 3.87–5.11)
RDW: 13.5 % (ref 11.5–15.5)
WBC: 7.6 10*3/uL (ref 4.0–10.5)

## 2014-03-11 LAB — C-REACTIVE PROTEIN: CRP: 2.7 mg/dL — ABNORMAL HIGH (ref <0.60)

## 2014-03-11 NOTE — Telephone Encounter (Signed)
Pt called back. She doesn't understand why our records dont reflect She had an endoscopy on 05-07-09, 10/07/08 and colonscopy on 03-03-09. Her dr is Dr Trilby Leaver with [facility]. She does not like ANY of the [facility]

## 2014-03-11 NOTE — Assessment & Plan Note (Signed)
Good control.  Due for lab check.

## 2014-03-11 NOTE — Assessment & Plan Note (Signed)
Due for check 

## 2014-03-11 NOTE — Assessment & Plan Note (Signed)
Encouraged to take adderall daily.

## 2014-03-11 NOTE — Telephone Encounter (Signed)
Patient came in today for visit, got new dates

## 2014-03-11 NOTE — Progress Notes (Signed)
   Subjective:    Patient ID: [name], female    DOB: Apr 25, 1931, 80 y.o.   MRN: [id number]  HPI Depressed but otherwise doing well.  She has become depressed with the heat of summer.  In the spring, she was working daily in her garden and happy.  Is not leaving the house much in the heat of summer.  Not taking adderall daily, only sporadically.    No new complaints.  Denies CP, SOB, seizures.      Review of Systems     Objective:   Physical Exam Good affect in the [facility] Lungs clear Cardiac RRR without m or g         Assessment & Plan:

## 2014-03-11 NOTE — Patient Instructions (Signed)
Take your adderall every day until you can get back working in your garden I will call and send a letter with your blood work results.

## 2014-03-12 LAB — TSH: TSH: 1.352 u[IU]/mL (ref 0.350–4.500)

## 2014-03-15 ENCOUNTER — Telehealth: Payer: Self-pay | Admitting: Family Medicine

## 2014-03-15 NOTE — Telephone Encounter (Signed)
Copy of labs were mailed to patient,patient informed.Chonte Ricke, Lewie Loron

## 2014-03-15 NOTE — Telephone Encounter (Signed)
t called and wanted to know if Dr. Andria Frames was able to print off and mail her lab results to her? She is very concerned even though everything was explained in detail by the doctor she would still like a hard copy. jw

## 2014-03-24 ENCOUNTER — Telehealth: Payer: Self-pay | Admitting: Family Medicine

## 2014-03-24 NOTE — Telephone Encounter (Signed)
Pt concerned about elevated CRP.  Has researched and found "age 79 and older, even if no known risk factors, should screen for PAD".  She has tingling and numbness in feet so she is concerned.   Unk Lightning

## 2014-03-25 DIAGNOSIS — R609 Edema, unspecified: Secondary | ICD-10-CM | POA: Diagnosis not present

## 2014-04-11 DIAGNOSIS — Z86010 Personal history of colonic polyps: Secondary | ICD-10-CM | POA: Diagnosis not present

## 2014-04-11 DIAGNOSIS — K5730 Diverticulosis of large intestine without perforation or abscess without bleeding: Secondary | ICD-10-CM | POA: Diagnosis not present

## 2014-04-12 ENCOUNTER — Encounter: Payer: Self-pay | Admitting: Family Medicine

## 2014-04-12 DIAGNOSIS — D126 Benign neoplasm of colon, unspecified: Secondary | ICD-10-CM | POA: Insufficient documentation

## 2014-04-17 ENCOUNTER — Other Ambulatory Visit: Payer: Self-pay | Admitting: Family Medicine

## 2014-04-20 DIAGNOSIS — H52209 Unspecified astigmatism, unspecified eye: Secondary | ICD-10-CM | POA: Diagnosis not present

## 2014-04-20 DIAGNOSIS — H35379 Puckering of macula, unspecified eye: Secondary | ICD-10-CM | POA: Diagnosis not present

## 2014-04-20 DIAGNOSIS — H40009 Preglaucoma, unspecified, unspecified eye: Secondary | ICD-10-CM | POA: Diagnosis not present

## 2014-04-20 DIAGNOSIS — Z961 Presence of intraocular lens: Secondary | ICD-10-CM | POA: Diagnosis not present

## 2014-05-06 ENCOUNTER — Telehealth: Payer: Self-pay | Admitting: Family Medicine

## 2014-05-06 DIAGNOSIS — F988 Other specified behavioral and emotional disorders with onset usually occurring in childhood and adolescence: Secondary | ICD-10-CM

## 2014-05-06 NOTE — Telephone Encounter (Signed)
Pt called and needs a refill on her Adderall left up front for pick up. jw ° °

## 2014-05-09 ENCOUNTER — Telehealth: Payer: Self-pay | Admitting: *Deleted

## 2014-05-09 MED ORDER — AMPHETAMINE-DEXTROAMPHETAMINE 10 MG PO TABS: 10.0000 mg | ORAL_TABLET | Freq: Every day | ORAL | Status: DC

## 2014-05-09 NOTE — Telephone Encounter (Signed)
Called and informed patient that her Rx is ready for her to pick up. She says she will be here tomorrow for a flu shot and will pick it up at the time.Shey Bartmess, Kevin Fenton

## 2014-05-10 ENCOUNTER — Ambulatory Visit (INDEPENDENT_AMBULATORY_CARE_PROVIDER_SITE_OTHER): Payer: Medicare Other | Admitting: *Deleted

## 2014-05-10 DIAGNOSIS — Z23 Encounter for immunization: Secondary | ICD-10-CM

## 2014-07-19 DIAGNOSIS — Z961 Presence of intraocular lens: Secondary | ICD-10-CM | POA: Diagnosis not present

## 2014-07-19 DIAGNOSIS — H40003 Preglaucoma, unspecified, bilateral: Secondary | ICD-10-CM | POA: Diagnosis not present

## 2014-07-19 DIAGNOSIS — H35371 Puckering of macula, right eye: Secondary | ICD-10-CM | POA: Diagnosis not present

## 2014-07-21 ENCOUNTER — Encounter (HOSPITAL_COMMUNITY): Payer: Self-pay | Admitting: Cardiology

## 2014-08-10 ENCOUNTER — Telehealth: Payer: Self-pay | Admitting: Family Medicine

## 2014-08-10 NOTE — Telephone Encounter (Signed)
Pt is calling and needs a refill on her Adderall and would like the dosage changed also. Please call and discuss with a patient. jw

## 2014-08-15 NOTE — Telephone Encounter (Signed)
Would like to increase her Adderal dosage to 2 pills a day Would like to talk to dr Andria Frames about this

## 2014-08-15 NOTE — Telephone Encounter (Signed)
Discussed.  I am reluctant to increase her adderall due to age and concern for cardiovascular effects.  At the same time I recognize that she has severe, longstanding depression.  She will make an appointment with me to discuss further face-to-face.

## 2014-08-22 ENCOUNTER — Other Ambulatory Visit: Payer: Self-pay | Admitting: Family Medicine

## 2014-08-22 DIAGNOSIS — F988 Other specified behavioral and emotional disorders with onset usually occurring in childhood and adolescence: Secondary | ICD-10-CM

## 2014-08-22 DIAGNOSIS — H40003 Preglaucoma, unspecified, bilateral: Secondary | ICD-10-CM | POA: Diagnosis not present

## 2014-08-22 MED ORDER — AMPHETAMINE-DEXTROAMPHETAMINE 10 MG PO TABS
10.0000 mg | ORAL_TABLET | Freq: Every day | ORAL | Status: DC
Start: 2014-08-22 — End: 2014-10-19

## 2014-10-14 ENCOUNTER — Other Ambulatory Visit: Payer: Self-pay | Admitting: Family Medicine

## 2014-10-14 DIAGNOSIS — M1990 Unspecified osteoarthritis, unspecified site: Secondary | ICD-10-CM

## 2014-10-14 MED ORDER — TRAMADOL HCL 50 MG PO TABS: 50.0000 mg | ORAL_TABLET | Freq: Three times a day (TID) | ORAL | Status: DC | PRN

## 2014-10-14 NOTE — Telephone Encounter (Signed)
Pt requesting a refill on tramadol, says she doesn't take it often so the 3 refills she had left expired, pt is scheduled to see MD on 10/19/14. Pt only takes this when the ibuprofen doesn't work. Also needs zovirax refilled. Pt goes to [facility].

## 2014-10-19 ENCOUNTER — Encounter: Payer: Self-pay | Admitting: Family Medicine

## 2014-10-19 ENCOUNTER — Ambulatory Visit (INDEPENDENT_AMBULATORY_CARE_PROVIDER_SITE_OTHER): Payer: Medicare Other | Admitting: Family Medicine

## 2014-10-19 VITALS — BP 145/75 | HR 71 | Temp 98.1°F | Ht 65.0 in | Wt 217.4 lb

## 2014-10-19 DIAGNOSIS — I10 Essential (primary) hypertension: Secondary | ICD-10-CM

## 2014-10-19 DIAGNOSIS — F319 Bipolar disorder, unspecified: Secondary | ICD-10-CM | POA: Diagnosis not present

## 2014-10-19 DIAGNOSIS — F988 Other specified behavioral and emotional disorders with onset usually occurring in childhood and adolescence: Secondary | ICD-10-CM

## 2014-10-19 DIAGNOSIS — F909 Attention-deficit hyperactivity disorder, unspecified type: Secondary | ICD-10-CM

## 2014-10-19 MED ORDER — AMPHETAMINE-DEXTROAMPHETAMINE 10 MG PO TABS: 10.0000 mg | ORAL_TABLET | Freq: Every day | ORAL | Status: DC

## 2014-10-19 MED ORDER — AMPHETAMINE-DEXTROAMPHETAMINE 10 MG PO TABS
10.0000 mg | ORAL_TABLET | Freq: Every day | ORAL | Status: DC
Start: 2014-10-19 — End: 2014-10-19

## 2014-10-20 NOTE — Assessment & Plan Note (Signed)
Refill adderall

## 2014-10-20 NOTE — Assessment & Plan Note (Signed)
At goal.  

## 2014-10-20 NOTE — Patient Instructions (Signed)
Please stay on your medications.   Get out and enjoy the spring.  It is good for the soul to dig in the dirt. I will see you in three months.

## 2014-10-20 NOTE — Assessment & Plan Note (Signed)
Mood good and stable.

## 2014-10-20 NOTE — Progress Notes (Signed)
   Subjective:    Patient ID: [name], female    DOB: 1931-04-17, 81 y.o.   MRN: [id number]  HPI   Recheck and refill of adderall.  Generally feeling well.  Was depressed around Christmas (typical for her.) Recent warming trend has allowed her to get outside and dig in her soil/yard, which is always therapeutic.  Denies SI or HI.  Denies CP.   Very attached to her adderall - feels it has made a tremendous difference.  Once again we discussed my reluctance to increase her dose due to age and Hx of NSTMI.    Needs prevnar No seizures   Review of Systems     Objective:   Physical Exam  Lungs clear Cardiac RRR without m or g Abd benign       Assessment & Plan:

## 2014-10-27 ENCOUNTER — Other Ambulatory Visit: Payer: Self-pay | Admitting: Family Medicine

## 2015-01-10 ENCOUNTER — Other Ambulatory Visit: Payer: Self-pay | Admitting: Family Medicine

## 2015-02-01 ENCOUNTER — Other Ambulatory Visit: Payer: Self-pay | Admitting: Family Medicine

## 2015-02-01 ENCOUNTER — Telehealth: Payer: Self-pay | Admitting: Family Medicine

## 2015-02-01 DIAGNOSIS — F988 Other specified behavioral and emotional disorders with onset usually occurring in childhood and adolescence: Secondary | ICD-10-CM

## 2015-02-01 MED ORDER — AMPHETAMINE-DEXTROAMPHETAMINE 10 MG PO TABS: 10.0000 mg | ORAL_TABLET | Freq: Every day | ORAL | Status: DC

## 2015-02-01 NOTE — Assessment & Plan Note (Signed)
Reprinting: First Rx was printed on non Rx paper

## 2015-02-01 NOTE — Telephone Encounter (Signed)
Rx printed. Patient wants to see ortho for frozen shoulder.  I will be happy to refer if needed by insurance Patient wants psych referral.  She is still upset that I am unwilling to increase her adderall.  I will be happy to refer.  She will get me the name of the desired provider.

## 2015-02-01 NOTE — Telephone Encounter (Signed)
Pt called and needs a refill on her Adderall left up front for pick up. jw

## 2015-02-06 ENCOUNTER — Other Ambulatory Visit: Payer: Self-pay

## 2015-02-07 ENCOUNTER — Telehealth: Payer: Self-pay | Admitting: Family Medicine

## 2015-02-07 NOTE — Telephone Encounter (Signed)
Pt wants all of her chart notes for the past 5 yrs that mention heart disease. She wants to know who made the determination that she has heart disease, the tests that were done to determine there was heart disease, why the tests were requested, who interpreted them " I want to see the written interpretation that was signed by a cardiologist" " i want this emailed to me" [email or web address]

## 2015-02-08 NOTE — Telephone Encounter (Signed)
Pt called about the docomentation she needs to show she has a heart problem. She has an appt tomorrow with ortho Dr Hector Shade She is wanting to increase her dosage of Adderall.

## 2015-02-08 NOTE — Telephone Encounter (Signed)
Pt would like a referral to psychiatrist but doesn't want to Dr Neta Ehlers???? Says it is for old folks and she is not old Please advise

## 2015-02-09 DIAGNOSIS — M25511 Pain in right shoulder: Secondary | ICD-10-CM | POA: Diagnosis not present

## 2015-02-09 NOTE — Telephone Encounter (Signed)
[name] is upset with me that I won't increase her adderall dosage.  I have held firm because: 1. She has a history of demand ischemia/NSTMI when urosepsis hosp 03/2012.  Cath at that time showed mild CAD. 2. Age 81 must be extremely careful with stimulants. 3. She has bipolar and I do not want to induce mania with adderall.  I am trying to placate her and still stick with my plan.  I have copied the DC summary and cath report and will mail to her.

## 2015-02-10 NOTE — Telephone Encounter (Signed)
Pt informed. [name], CMA  

## 2015-03-01 ENCOUNTER — Ambulatory Visit (INDEPENDENT_AMBULATORY_CARE_PROVIDER_SITE_OTHER): Payer: Medicare Other | Admitting: Family Medicine

## 2015-03-01 ENCOUNTER — Encounter: Payer: Self-pay | Admitting: Family Medicine

## 2015-03-01 VITALS — BP 180/67 | HR 64 | Temp 98.6°F | Ht 65.0 in | Wt 221.0 lb

## 2015-03-01 DIAGNOSIS — L989 Disorder of the skin and subcutaneous tissue, unspecified: Secondary | ICD-10-CM | POA: Insufficient documentation

## 2015-03-01 DIAGNOSIS — H9192 Unspecified hearing loss, left ear: Secondary | ICD-10-CM

## 2015-03-01 DIAGNOSIS — E7800 Pure hypercholesterolemia, unspecified: Secondary | ICD-10-CM

## 2015-03-01 DIAGNOSIS — D126 Benign neoplasm of colon, unspecified: Secondary | ICD-10-CM

## 2015-03-01 DIAGNOSIS — F319 Bipolar disorder, unspecified: Secondary | ICD-10-CM

## 2015-03-01 DIAGNOSIS — I10 Essential (primary) hypertension: Secondary | ICD-10-CM | POA: Diagnosis not present

## 2015-03-01 MED ORDER — METOPROLOL SUCCINATE ER 50 MG PO TB24: 50.0000 mg | ORAL_TABLET | Freq: Every day | ORAL | Status: DC

## 2015-03-01 NOTE — Patient Instructions (Addendum)
You look good today - except your blood pressure is a little up. I increased your metoprolol from 25 to 50 mg a day.  I sent in a new prescription.  You can use up your current supply by taking two pills. Make sure you take your blood pressure medicines every day. I have ordered future blood work.  Come in any time after 03/12/15 to get that drawn.  Fasting.  Call the day before for an appointment. I will call with the blood test results. I would like see you again in 2-3 months. I need to be sure your blood pressure is under control. The nurse will call you with the dermatology and ENT referrals.

## 2015-03-01 NOTE — Assessment & Plan Note (Signed)
Screen after 7/31

## 2015-03-02 NOTE — Progress Notes (Signed)
   Subjective:    Patient ID: [name], female    DOB: 1931/07/16, 81 y.o.   MRN: [id number]  HPI Multiple problems/issues: She is now content with dose of adderall.  We have made peace over this. States she wants a derm referral for skin lesions on abdomin which she does not want me to examine.   States she wants an ENT referral/second opinion about the unilateral left sided hearing loss.   BP admits to intermitent compliance with her BP meds.  Denies chest pain or SOB.   Will be due for another lipid panel after 7/31.   Did discuss repeating colonoscopy with her GI doc.  She has decided no more colonoscopies due to age.  Bipolar seems OK control.  She admits to being quite upset by the politics currently swirling in our country.      Review of Systems     Objective:   Physical Exam TMs clear bilaterally Lungs clear Cardiac RRR with 1/6 SEM. Refused skin exam.   Neuro, Appropriate throughout.  Easily distracted and tangential at times.          Assessment & Plan:

## 2015-03-02 NOTE — Assessment & Plan Note (Signed)
Desires no further colonoscopies.

## 2015-03-02 NOTE — Assessment & Plan Note (Signed)
Refer as requested.  Reasonable given unilateral hearing loss

## 2015-03-02 NOTE — Assessment & Plan Note (Signed)
Poor control.  Increase B blocker.

## 2015-03-02 NOTE — Assessment & Plan Note (Signed)
Stable on current meds 

## 2015-03-09 ENCOUNTER — Other Ambulatory Visit: Payer: Self-pay | Admitting: *Deleted

## 2015-03-09 DIAGNOSIS — L821 Other seborrheic keratosis: Secondary | ICD-10-CM | POA: Diagnosis not present

## 2015-03-09 DIAGNOSIS — L814 Other melanin hyperpigmentation: Secondary | ICD-10-CM | POA: Diagnosis not present

## 2015-03-09 DIAGNOSIS — Z85828 Personal history of other malignant neoplasm of skin: Secondary | ICD-10-CM | POA: Diagnosis not present

## 2015-03-09 DIAGNOSIS — L570 Actinic keratosis: Secondary | ICD-10-CM | POA: Diagnosis not present

## 2015-03-09 DIAGNOSIS — L820 Inflamed seborrheic keratosis: Secondary | ICD-10-CM | POA: Diagnosis not present

## 2015-03-09 DIAGNOSIS — D225 Melanocytic nevi of trunk: Secondary | ICD-10-CM | POA: Diagnosis not present

## 2015-03-09 MED ORDER — FLUOXETINE HCL 40 MG PO CAPS: 40.0000 mg | ORAL_CAPSULE | Freq: Every day | ORAL | Status: DC

## 2015-03-13 ENCOUNTER — Telehealth: Payer: Self-pay | Admitting: Family Medicine

## 2015-03-13 NOTE — Telephone Encounter (Signed)
Pt wants to know if she takes her meds before she has fasting labs

## 2015-03-13 NOTE — Telephone Encounter (Signed)
OK to take meds before blood draw.  Patient informed.

## 2015-03-16 ENCOUNTER — Other Ambulatory Visit: Payer: Medicare Other

## 2015-03-16 DIAGNOSIS — E7800 Pure hypercholesterolemia, unspecified: Secondary | ICD-10-CM

## 2015-03-16 DIAGNOSIS — I10 Essential (primary) hypertension: Secondary | ICD-10-CM | POA: Diagnosis not present

## 2015-03-16 LAB — CBC
HEMATOCRIT: 42.7 % (ref 36.0–46.0)
HEMOGLOBIN: 14.6 g/dL (ref 12.0–15.0)
MCH: 30.4 pg (ref 26.0–34.0)
MCHC: 34.2 g/dL (ref 30.0–36.0)
MCV: 89 fL (ref 78.0–100.0)
MPV: 11.3 fL (ref 8.6–12.4)
Platelets: 246 10*3/uL (ref 150–400)
RBC: 4.8 MIL/uL (ref 3.87–5.11)
RDW: 14.3 % (ref 11.5–15.5)
WBC: 6.4 10*3/uL (ref 4.0–10.5)

## 2015-03-16 LAB — COMPREHENSIVE METABOLIC PANEL
ALBUMIN: 4 g/dL (ref 3.6–5.1)
ALT: 14 U/L (ref 6–29)
AST: 19 U/L (ref 10–35)
Alkaline Phosphatase: 77 U/L (ref 33–130)
BUN: 27 mg/dL — ABNORMAL HIGH (ref 7–25)
CO2: 26 mmol/L (ref 20–31)
Calcium: 9.2 mg/dL (ref 8.6–10.4)
Chloride: 103 mmol/L (ref 98–110)
Creat: 0.71 mg/dL (ref 0.60–0.88)
GLUCOSE: 81 mg/dL (ref 65–99)
Potassium: 4 mmol/L (ref 3.5–5.3)
SODIUM: 142 mmol/L (ref 135–146)
TOTAL PROTEIN: 6.6 g/dL (ref 6.1–8.1)
Total Bilirubin: 0.8 mg/dL (ref 0.2–1.2)

## 2015-03-16 LAB — LIPID PANEL
CHOL/HDL RATIO: 2.8 ratio (ref <=5.0)
Cholesterol: 154 mg/dL (ref 125–200)
HDL: 56 mg/dL (ref >=46)
LDL CALC: 80 mg/dL (ref <130)
Triglycerides: 90 mg/dL (ref <150)
VLDL: 18 mg/dL (ref <30)

## 2015-03-16 LAB — TSH: TSH: 2.341 u[IU]/mL (ref 0.350–4.500)

## 2015-03-16 LAB — C-REACTIVE PROTEIN: CRP: <0.5 mg/dL (ref <0.60)

## 2015-03-16 NOTE — Progress Notes (Signed)
[name]

## 2015-03-17 ENCOUNTER — Encounter: Payer: Self-pay | Admitting: Family Medicine

## 2015-03-27 ENCOUNTER — Telehealth: Payer: Self-pay | Admitting: Family Medicine

## 2015-03-27 MED ORDER — METOPROLOL SUCCINATE ER 25 MG PO TB24: 25.0000 mg | ORAL_TABLET | Freq: Every day | ORAL | Status: DC

## 2015-03-27 NOTE — Telephone Encounter (Signed)
Called.  I am OK with the lower dose of metoprolol as long as her BP remains controled.

## 2015-03-27 NOTE — Telephone Encounter (Signed)
Pt calling because PCP "bumped up" metoprolol to 25 mg and refuses to take it because she has "done research" and she doesn't believe that this is the best option for her. She states that she needs to send him a web page to read and would like to know how to send this web link to him, I suggested MyChart, but she insists that this method does not work. She would like for PCP to know that her blood pressure is 134/78, and would like for him or a member of the clinical staff to call her back. Thank you, Fonda Kinder, ASA

## 2015-03-28 ENCOUNTER — Encounter: Payer: Self-pay | Admitting: Family Medicine

## 2015-04-11 ENCOUNTER — Other Ambulatory Visit: Payer: Self-pay | Admitting: Family Medicine

## 2015-05-12 ENCOUNTER — Other Ambulatory Visit: Payer: Self-pay | Admitting: Family Medicine

## 2015-05-12 DIAGNOSIS — F988 Other specified behavioral and emotional disorders with onset usually occurring in childhood and adolescence: Secondary | ICD-10-CM

## 2015-05-12 MED ORDER — AMPHETAMINE-DEXTROAMPHETAMINE 10 MG PO TABS: 10.0000 mg | ORAL_TABLET | Freq: Every day | ORAL | Status: DC

## 2015-05-12 NOTE — Telephone Encounter (Signed)
Done

## 2015-05-12 NOTE — Telephone Encounter (Signed)
Pt called and would like a refill on her Adderall left up front for pick up. jw

## 2015-05-17 ENCOUNTER — Telehealth: Payer: Self-pay | Admitting: Family Medicine

## 2015-05-17 NOTE — Telephone Encounter (Signed)
Patient is needing a rx for Robaxin 750mg .  She uses [facility] on [address].

## 2015-05-18 ENCOUNTER — Telehealth: Payer: Self-pay | Admitting: Family Medicine

## 2015-05-18 ENCOUNTER — Other Ambulatory Visit: Payer: Self-pay | Admitting: Family Medicine

## 2015-05-18 MED ORDER — BACLOFEN 10 MG PO TABS: 10.0000 mg | ORAL_TABLET | Freq: Three times a day (TID) | ORAL | Status: DC | PRN

## 2015-05-18 NOTE — Telephone Encounter (Signed)
Robaxin is a [facility] drug.  Will give baclofen

## 2015-05-29 DIAGNOSIS — M5137 Other intervertebral disc degeneration, lumbosacral region: Secondary | ICD-10-CM | POA: Diagnosis not present

## 2015-05-29 DIAGNOSIS — M9902 Segmental and somatic dysfunction of thoracic region: Secondary | ICD-10-CM | POA: Diagnosis not present

## 2015-05-29 DIAGNOSIS — M9905 Segmental and somatic dysfunction of pelvic region: Secondary | ICD-10-CM | POA: Diagnosis not present

## 2015-05-29 DIAGNOSIS — M5414 Radiculopathy, thoracic region: Secondary | ICD-10-CM | POA: Diagnosis not present

## 2015-05-29 DIAGNOSIS — M5417 Radiculopathy, lumbosacral region: Secondary | ICD-10-CM | POA: Diagnosis not present

## 2015-05-29 DIAGNOSIS — M9903 Segmental and somatic dysfunction of lumbar region: Secondary | ICD-10-CM | POA: Diagnosis not present

## 2015-06-29 DIAGNOSIS — L821 Other seborrheic keratosis: Secondary | ICD-10-CM | POA: Diagnosis not present

## 2015-06-29 DIAGNOSIS — L820 Inflamed seborrheic keratosis: Secondary | ICD-10-CM | POA: Diagnosis not present

## 2015-06-29 DIAGNOSIS — Z85828 Personal history of other malignant neoplasm of skin: Secondary | ICD-10-CM | POA: Diagnosis not present

## 2015-06-29 DIAGNOSIS — L718 Other rosacea: Secondary | ICD-10-CM | POA: Diagnosis not present

## 2015-09-05 ENCOUNTER — Telehealth: Payer: Self-pay | Admitting: Family Medicine

## 2015-09-05 DIAGNOSIS — F988 Other specified behavioral and emotional disorders with onset usually occurring in childhood and adolescence: Secondary | ICD-10-CM

## 2015-09-05 DIAGNOSIS — M12811 Other specific arthropathies, not elsewhere classified, right shoulder: Secondary | ICD-10-CM | POA: Diagnosis not present

## 2015-09-05 DIAGNOSIS — M19011 Primary osteoarthritis, right shoulder: Secondary | ICD-10-CM | POA: Diagnosis not present

## 2015-09-05 NOTE — Telephone Encounter (Signed)
Pt called because she just left Dr. Gilberto Better office at [facility] and he would like surgery clearance for her. They want to do reverse shoulder on the right side. jw

## 2015-09-06 NOTE — Telephone Encounter (Signed)
She would also like to inform you that she is taking 2 ibuprofen 200mg  daily now.  If you want her to take something else please let her know.  Jamielynn Wigley,CMA

## 2015-09-06 NOTE — Telephone Encounter (Signed)
Planned right shoulder surgery.  Needs surgical clearance.  Patient also is taking ibuprofen 400 mg bid - asks if OK, yes.   Given high bp last visit, asked to make appointment for tomorrow for face-to-face surg clearance.  Will refill meds at that time.

## 2015-09-06 NOTE — Telephone Encounter (Signed)
Patient called again regarding surgical clearance. Patient would like to know if we can get a letter faxed them ASAP.  She states that she also needs a script for her adderall.  Jazmin Hartsell,CMA

## 2015-09-07 ENCOUNTER — Encounter: Payer: Self-pay | Admitting: Family Medicine

## 2015-09-07 ENCOUNTER — Ambulatory Visit (HOSPITAL_COMMUNITY)
Admission: RE | Admit: 2015-09-07 | Discharge: 2015-09-07 | Disposition: A | Discharge status: Home / Self Care | Payer: Medicare Other | Source: Ambulatory Visit | Attending: Family Medicine | Admitting: Family Medicine

## 2015-09-07 ENCOUNTER — Ambulatory Visit (INDEPENDENT_AMBULATORY_CARE_PROVIDER_SITE_OTHER): Payer: Medicare Other | Admitting: Family Medicine

## 2015-09-07 VITALS — BP 148/61 | HR 75 | Temp 98.1°F | Ht 65.0 in | Wt 218.3 lb

## 2015-09-07 DIAGNOSIS — F909 Attention-deficit hyperactivity disorder, unspecified type: Secondary | ICD-10-CM | POA: Diagnosis not present

## 2015-09-07 DIAGNOSIS — I10 Essential (primary) hypertension: Secondary | ICD-10-CM

## 2015-09-07 DIAGNOSIS — F319 Bipolar disorder, unspecified: Secondary | ICD-10-CM | POA: Diagnosis not present

## 2015-09-07 DIAGNOSIS — R011 Cardiac murmur, unspecified: Secondary | ICD-10-CM | POA: Diagnosis not present

## 2015-09-07 DIAGNOSIS — Z23 Encounter for immunization: Secondary | ICD-10-CM | POA: Diagnosis not present

## 2015-09-07 DIAGNOSIS — L719 Rosacea, unspecified: Secondary | ICD-10-CM

## 2015-09-07 DIAGNOSIS — R9431 Abnormal electrocardiogram [ECG] [EKG]: Secondary | ICD-10-CM | POA: Insufficient documentation

## 2015-09-07 DIAGNOSIS — Z01818 Encounter for other preprocedural examination: Secondary | ICD-10-CM

## 2015-09-07 DIAGNOSIS — F988 Other specified behavioral and emotional disorders with onset usually occurring in childhood and adolescence: Secondary | ICD-10-CM

## 2015-09-07 MED ORDER — ASPIRIN 81 MG PO TABS: 81.0000 mg | ORAL_TABLET | Freq: Every day | ORAL | Status: DC

## 2015-09-07 MED ORDER — AMPHETAMINE-DEXTROAMPHETAMINE 10 MG PO TABS: 10.0000 mg | ORAL_TABLET | Freq: Every day | ORAL | Status: DC

## 2015-09-07 NOTE — Assessment & Plan Note (Signed)
Seems at baseline.

## 2015-09-07 NOTE — Assessment & Plan Note (Signed)
Likely a sclerotic murmur.  Will check echo to be sure I am not dealing with important aortic stenosis.

## 2015-09-07 NOTE — Patient Instructions (Signed)
You look good. I will call with echocardiogram results.  Assuming those results are good, I will give Dr. Veverly Fells medical clearance. Stay in touch.  I hope the surgery goes well.

## 2015-09-07 NOTE — Assessment & Plan Note (Addendum)
Big issues are cardiovascular eval.  I am satisfied with blood pressure control.  Add daily ASA.  Main issue is to evaluate murmur.  Will check echocardiogram

## 2015-09-07 NOTE — Assessment & Plan Note (Signed)
Doing well on current meds.  Refill

## 2015-09-07 NOTE — Progress Notes (Signed)
   Subjective:    Patient ID: [name], female    DOB: Feb 01, 1931, 82 y.o.   MRN: [id number]  HPI CC surgical clearance. Having significant right shoulder pain, has been seen by ortho and plans for right shoulder replacement.  Needs surgical clearance. Major issues. Psych:  Has both ADD and bipolar.  Usual state is mild hypomania.  Does well with daily adderall.  No SI or HI.   Cardiac: has hypertension which seems reasonably well controled.  Hx of non ST mi when hospitalized for head trauma and single seizure.  No complaints of chest pain or DOE.  Very active. On metoprolol, HCTZ and statin.   Immunizations, due for flu and second pneumonia vaccine.  HPDP Also due for mammo    Review of Systems     Objective:   Physical ExamVS noted Neck supple without bruits Lungs clear Cardiac RRR with 2/6 SEM Abd benign Ext trace edema.        Assessment & Plan:

## 2015-09-07 NOTE — Assessment & Plan Note (Signed)
Good control, add aspirin.

## 2015-09-08 NOTE — Addendum Note (Signed)
Addended by: Katharina Caper, APRIL D on: 09/08/2015 05:03 PM   Modules accepted: Orders

## 2015-09-15 ENCOUNTER — Other Ambulatory Visit (HOSPITAL_COMMUNITY): Payer: Self-pay

## 2015-09-19 ENCOUNTER — Emergency Department (HOSPITAL_COMMUNITY)
Admission: EM | Admit: 2015-09-19 | Discharge: 2015-09-19 | Disposition: A | Discharge status: Home / Self Care | Payer: Medicare Other | Attending: Emergency Medicine | Admitting: Emergency Medicine

## 2015-09-19 ENCOUNTER — Emergency Department (HOSPITAL_COMMUNITY): Payer: Medicare Other

## 2015-09-19 ENCOUNTER — Encounter (HOSPITAL_COMMUNITY): Payer: Self-pay | Admitting: Emergency Medicine

## 2015-09-19 DIAGNOSIS — Z7982 Long term (current) use of aspirin: Secondary | ICD-10-CM | POA: Diagnosis not present

## 2015-09-19 DIAGNOSIS — F319 Bipolar disorder, unspecified: Secondary | ICD-10-CM | POA: Diagnosis not present

## 2015-09-19 DIAGNOSIS — Z8719 Personal history of other diseases of the digestive system: Secondary | ICD-10-CM | POA: Insufficient documentation

## 2015-09-19 DIAGNOSIS — E669 Obesity, unspecified: Secondary | ICD-10-CM | POA: Insufficient documentation

## 2015-09-19 DIAGNOSIS — Q2730 Arteriovenous malformation, site unspecified: Secondary | ICD-10-CM | POA: Insufficient documentation

## 2015-09-19 DIAGNOSIS — N390 Urinary tract infection, site not specified: Secondary | ICD-10-CM

## 2015-09-19 DIAGNOSIS — Z87828 Personal history of other (healed) physical injury and trauma: Secondary | ICD-10-CM | POA: Diagnosis not present

## 2015-09-19 DIAGNOSIS — J111 Influenza due to unidentified influenza virus with other respiratory manifestations: Secondary | ICD-10-CM | POA: Diagnosis not present

## 2015-09-19 DIAGNOSIS — I10 Essential (primary) hypertension: Secondary | ICD-10-CM | POA: Diagnosis not present

## 2015-09-19 DIAGNOSIS — R509 Fever, unspecified: Secondary | ICD-10-CM | POA: Diagnosis present

## 2015-09-19 DIAGNOSIS — Z8742 Personal history of other diseases of the female genital tract: Secondary | ICD-10-CM | POA: Diagnosis not present

## 2015-09-19 DIAGNOSIS — E785 Hyperlipidemia, unspecified: Secondary | ICD-10-CM | POA: Insufficient documentation

## 2015-09-19 DIAGNOSIS — Z9889 Other specified postprocedural states: Secondary | ICD-10-CM | POA: Insufficient documentation

## 2015-09-19 DIAGNOSIS — R69 Illness, unspecified: Secondary | ICD-10-CM

## 2015-09-19 DIAGNOSIS — Z79899 Other long term (current) drug therapy: Secondary | ICD-10-CM | POA: Insufficient documentation

## 2015-09-19 DIAGNOSIS — Z880 Allergy status to penicillin: Secondary | ICD-10-CM | POA: Diagnosis not present

## 2015-09-19 LAB — I-STAT CG4 LACTIC ACID, ED: LACTIC ACID, VENOUS: 1.98 mmol/L (ref 0.5–2.0)

## 2015-09-19 LAB — URINE MICROSCOPIC-ADD ON: SQUAMOUS EPITHELIAL / LPF: NONE SEEN

## 2015-09-19 LAB — URINALYSIS, ROUTINE W REFLEX MICROSCOPIC
Bilirubin Urine: NEGATIVE
Glucose, UA: NEGATIVE mg/dL
Ketones, ur: NEGATIVE mg/dL
NITRITE: NEGATIVE
PROTEIN: 100 mg/dL — AB
SPECIFIC GRAVITY, URINE: 1.017 (ref 1.005–1.030)
pH: 6 (ref 5.0–8.0)

## 2015-09-19 LAB — CBC
HEMATOCRIT: 41.3 % (ref 36.0–46.0)
HEMOGLOBIN: 13.9 g/dL (ref 12.0–15.0)
MCH: 30.3 pg (ref 26.0–34.0)
MCHC: 33.7 g/dL (ref 30.0–36.0)
MCV: 90 fL (ref 78.0–100.0)
Platelets: 213 10*3/uL (ref 150–400)
RBC: 4.59 MIL/uL (ref 3.87–5.11)
RDW: 13.6 % (ref 11.5–15.5)
WBC: 9.9 10*3/uL (ref 4.0–10.5)

## 2015-09-19 LAB — COMPREHENSIVE METABOLIC PANEL
ALT: 12 U/L — ABNORMAL LOW (ref 14–54)
ANION GAP: 12 (ref 5–15)
AST: 20 U/L (ref 15–41)
Albumin: 3.6 g/dL (ref 3.5–5.0)
Alkaline Phosphatase: 86 U/L (ref 38–126)
BILIRUBIN TOTAL: 1 mg/dL (ref 0.3–1.2)
BUN: 16 mg/dL (ref 6–20)
CHLORIDE: 102 mmol/L (ref 101–111)
CO2: 22 mmol/L (ref 22–32)
Calcium: 9.2 mg/dL (ref 8.9–10.3)
Creatinine, Ser: 0.71 mg/dL (ref 0.44–1.00)
Glucose, Bld: 128 mg/dL — ABNORMAL HIGH (ref 65–99)
POTASSIUM: 3.5 mmol/L (ref 3.5–5.1)
Sodium: 136 mmol/L (ref 135–145)
TOTAL PROTEIN: 7 g/dL (ref 6.5–8.1)

## 2015-09-19 LAB — CBG MONITORING, ED: GLUCOSE-CAPILLARY: 116 mg/dL — AB (ref 65–99)

## 2015-09-19 LAB — TSH: TSH: 0.656 u[IU]/mL (ref 0.350–4.500)

## 2015-09-19 LAB — T4, FREE: Free T4: 1.05 ng/dL (ref 0.61–1.12)

## 2015-09-19 MED ORDER — OSELTAMIVIR PHOSPHATE 75 MG PO CAPS
75.0000 mg | ORAL_CAPSULE | Freq: Once | ORAL | Status: AC
  Administered 2015-09-19: 75 mg via ORAL
  Filled 2015-09-19: qty 1

## 2015-09-19 MED ORDER — ONDANSETRON 4 MG PO TBDP: 4.0000 mg | ORAL_TABLET | Freq: Three times a day (TID) | ORAL | Status: DC | PRN

## 2015-09-19 MED ORDER — OSELTAMIVIR PHOSPHATE 75 MG PO CAPS: 75.0000 mg | ORAL_CAPSULE | Freq: Every day | ORAL | Status: AC

## 2015-09-19 MED ORDER — ACETAMINOPHEN 325 MG PO TABS
650.0000 mg | ORAL_TABLET | Freq: Once | ORAL | Status: AC
  Administered 2015-09-19: 650 mg via ORAL
  Filled 2015-09-19: qty 2

## 2015-09-19 MED ORDER — FOSFOMYCIN TROMETHAMINE 3 G PO PACK
3.0000 g | PACK | Freq: Once | ORAL | Status: AC
  Administered 2015-09-19: 3 g via ORAL
  Filled 2015-09-19: qty 3

## 2015-09-19 NOTE — ED Notes (Signed)
[name], is at the Information Desk on the floor, asked for [place] to give her a call if RN's need info. Her number is [phone].

## 2015-09-19 NOTE — ED Notes (Signed)
Patient transported to X-ray 

## 2015-09-19 NOTE — ED Notes (Signed)
Dr.Lockwood at bedside  

## 2015-09-19 NOTE — ED Provider Notes (Signed)
CSN: [id number]     Arrival date & time 09/19/15  1159 History   First MD Initiated Contact with Patient 09/19/15 1205     Chief Complaint  Patient presents with  . Influenza     (Consider location/radiation/quality/duration/timing/severity/associated sxs/prior Treatment) HPI Patient presents with concern of ongoing chills, fever, nausea, vomiting. Symptoms began yesterday. Since onset symptoms of been persistent, no clear alleviating or exacerbating factors. No specific chest pain, no ongoing abdominal pain. Patient denies recent sick contacts, travel. Patient states that she is generally well. No new medication, activity.  Past Medical History  Diagnosis Date  . Depression     sucide attempt in the distant past  . Ovarian cyst   . Obesity   . HTN (hypertension)   . HLD (hyperlipidemia)   . Seizures ([place]) 1980s    2/2 meningioma. none since cranioplasty  . Bipolar disorder ([place])   . Obesity   . Hiatal hernia     s/p repair  . GI bleed   . AVM (arteriovenous malformation)   . Low back pain     ESI by Dr. Nelva Bush 2 weeks ago  . OA (osteoarthritis)     bilateral wrist, bilateral knees  . Rotator cuff arthropathy     right  . Right wrist injury     multiple surgeries and residual weakness.    Past Surgical History  Procedure Laterality Date  . Hiatal hernia repair    . Breast reduction surgery    . Cranioplasty      2/2 meningioma 1980s  . Knee arthroplasty      bilat  . Cataract extraction Bilateral   . Left heart catheterization with coronary angiogram N/A 03/26/2012    Procedure: LEFT HEART CATHETERIZATION WITH CORONARY ANGIOGRAM;  Surgeon: Minus Breeding, MD;  Location: [facility] CATH LAB;  Service: Cardiovascular;  Laterality: N/A;   Family History  Problem Relation Age of Onset  . Alzheimer's disease Mother   . Heart disease Father     MI 106yo  . Heart disease Brother    Social History  Substance Use Topics  . Smoking status: Never Smoker   . Smokeless  tobacco: Never Used  . Alcohol Use: No   OB History    No data available     Review of Systems  Constitutional:       Per HPI, otherwise negative  HENT:       Per HPI, otherwise negative  Respiratory:       Per HPI, otherwise negative  Cardiovascular:       Per HPI, otherwise negative  Gastrointestinal: Positive for nausea and vomiting.  Endocrine:       Negative aside from HPI  Genitourinary:       Neg aside from HPI   Musculoskeletal:       Per HPI, otherwise negative  Skin: Negative.   Neurological: Positive for weakness. Negative for syncope.      Allergies  Amoxicillin; Penicillins; Morphine sulfate; Phenobarbital; and Phenytoin  Home Medications   Prior to Admission medications   Medication Sig Start Date End Date Taking? Authorizing Provider  acyclovir ointment (ZOVIRAX) 5 % APPLY TOPICALLY EVERY 3 HOURS 01/10/15   [name], MD  amphetamine-dextroamphetamine (ADDERALL) 10 MG tablet Take 1 tablet (10 mg total) by mouth daily. 09/07/15 09/06/16  [name], MD  aspirin 81 MG tablet Take 1 tablet (81 mg total) by mouth daily. 09/07/15   [name], MD  atorvastatin (LIPITOR) 10 MG tablet  TAKE 1 TABLET BY MOUTH EVERY NIGHT AT BEDTIME 10/27/14   [name], MD  FLUoxetine (PROZAC) 40 MG capsule Take 1 capsule (40 mg total) by mouth daily. 03/09/15   [name], MD  hydrochlorothiazide (MICROZIDE) 12.5 MG capsule TAKE ONE CAPSULE BY MOUTH DAILY 04/11/15   [name], MD  ibuprofen (ADVIL,MOTRIN) 200 MG tablet Take 200 mg by mouth every 6 (six) hours as needed.    Historical Provider, MD  metoprolol succinate (TOPROL-XL) 25 MG 24 hr tablet Take 1 tablet (25 mg total) by mouth daily. 03/27/15   [name], MD  metroNIDAZOLE (METROGEL) 0.75 % gel Apply 1 application topically.    Historical Provider, MD   Temp(Src) 102.6 F (39.2 C) (Oral) Physical Exam  Constitutional: She is oriented to person, place, and time. She appears  well-developed and well-nourished. No distress.  HENT:  Head: Normocephalic and atraumatic.  Eyes: Conjunctivae and EOM are normal.  Cardiovascular: Normal rate and regular rhythm.   Pulmonary/Chest: Effort normal and breath sounds normal. No stridor. No respiratory distress.  Abdominal: She exhibits no distension.  Musculoskeletal: She exhibits no edema.  Neurological: She is alert and oriented to person, place, and time. No cranial nerve deficit.  Skin: Skin is warm and dry.  Psychiatric: She has a normal mood and affect.  Nursing note and vitals reviewed.   ED Course  Procedures (including critical care time) Labs Review Labs Reviewed  COMPREHENSIVE METABOLIC PANEL - Abnormal; Notable for the following:    Glucose, Bld 128 (*)    ALT 12 (*)    All other components within normal limits  URINALYSIS, ROUTINE W REFLEX MICROSCOPIC (NOT AT [facility]) - Abnormal; Notable for the following:    Hgb urine dipstick SMALL (*)    Protein, ur 100 (*)    Leukocytes, UA MODERATE (*)    All other components within normal limits  URINE MICROSCOPIC-ADD ON - Abnormal; Notable for the following:    Bacteria, UA FEW (*)    All other components within normal limits  CBG MONITORING, ED - Abnormal; Notable for the following:    Glucose-Capillary 116 (*)    All other components within normal limits  URINE CULTURE  CBC  TSH  T4, FREE  I-STAT CG4 LACTIC ACID, ED    Imaging Review Dg Chest 2 View  09/19/2015  CLINICAL DATA:  82 year old presenting with acute onset of fever and sinonasal drainage which began yesterday. Current history of hypertension and hypercholesterolemia. EXAM: CHEST  2 VIEW COMPARISON:  11/20/2012 and earlier, including CT chest 11/19/2012. FINDINGS: AP erect and lateral images were obtained. Cardiac silhouette mildly enlarged, unchanged. Thoracic aorta atherosclerotic and tortuous, unchanged. Hilar and mediastinal contours otherwise unremarkable. Azygos fissure with prominent azygos  vein accounting for the right paratracheal opacity as noted on the prior CT. Lungs clear. Bronchovascular markings normal. Pulmonary vascularity normal. No visible pleural effusions. No pneumothorax. Degenerative changes involving the thoracic spine. IMPRESSION: Stable mild cardiomegaly.  No acute cardiopulmonary disease. Electronically Signed   By: Evangeline Dakin M.D.   On: 09/19/2015 12:51   I have personally reviewed and evaluated these images and lab results as part of my medical decision-making.   EKG Interpretation None     2:13 PM Patient in no distress, awake alert, smiling. We discussed all findings at length. Specifically we discussed the patient's urinalysis suggesting infection, though not conclusively. Patient will receive fosfomycin, culture sent.  Also discussed the possibility of influenza given the patient's fever, generalized  weakness, nausea, rhinorrhea. Patient requested initiation of empiric therapy.  MDM  Patient presents with generalized illness, including nausea, vomiting, chills. Patient is febrile, and though she has some suggestion for urinary tract infection, has no urinary complaints. Some suspicion for influenza-like illness. No evidence for bacteremia or sepsis. Discharged in stable condition.  Carmin Muskrat, MD 09/19/15 [phone]

## 2015-09-19 NOTE — ED Notes (Signed)
CBG 116  

## 2015-09-19 NOTE — Discharge Instructions (Signed)
As discussed, you have been diagnosed with an influenza-like illness.  In addition, there is some suspicion for a urinary tract infection.  Follow-up with your physician, take all medication as directed, and do not hesitate to return here for concerning changes in your condition.

## 2015-09-19 NOTE — ED Notes (Signed)
From home via [facility], c/o flu like symptoms since yesterday with fever, N/V and chills, ST 100-110, other VSS, 18g LAC, ?UTI symptoms, A/O and in NAD

## 2015-09-20 ENCOUNTER — Telehealth: Payer: Self-pay | Admitting: Family Medicine

## 2015-09-20 NOTE — Telephone Encounter (Signed)
Called and discussed

## 2015-09-20 NOTE — Telephone Encounter (Signed)
Pt called because she in the hospital and they want her in to see her PCP right away. SHe wants the doctor to look at her chart from the ER visit because they said she has a lot of things going on and needs to be seen right away. Please call patient. jw

## 2015-09-21 LAB — URINE CULTURE: Culture: >=100000

## 2015-09-22 ENCOUNTER — Telehealth (HOSPITAL_BASED_OUTPATIENT_CLINIC_OR_DEPARTMENT_OTHER): Payer: Self-pay | Admitting: Emergency Medicine

## 2015-09-22 NOTE — Telephone Encounter (Signed)
Post ED Visit - Positive Culture Follow-up: Successful Patient Follow-Up  Culture assessed and recommendations reviewed by: [x]  Elenor Quinones, Pharm.D. []  Heide Guile, Pharm.D., BCPS []  Parks Neptune, Pharm.D. []  Alycia Rossetti, Pharm.D., BCPS []  [place], [place].D., BCPS, AAHIVP []  Legrand Como, Pharm.D., BCPS, AAHIVP []  Milus Glazier, Pharm.D. []  Rob Evette Doffing, Pharm.D.  Positive urine culture E. coli  [x]  Patient discharged without antimicrobial prescription and treatment is now indicated []  Organism is resistant to prescribed ED discharge antimicrobial []  Patient with positive blood cultures  Changes discussed with ED provider: Lorre Munroe PA New antibiotic prescription Bactrim DS 1 tab po bid x 3 days Called to [facility] [id number]  09/22/15 1556   Hazle Nordmann 09/22/2015, 3:54 PM

## 2015-09-22 NOTE — Progress Notes (Signed)
ED Antimicrobial Stewardship Positive Culture Follow Up   [name] is an 82 y.o. female who presented to [facility] on 09/19/2015 with a chief complaint of  Chief Complaint  Patient presents with  . Influenza    Recent Results (from the past 720 hour(s))  Urine culture     Status: None   Collection Time: 09/19/15 12:10 PM  Result Value Ref Range Status   Specimen Description URINE, CLEAN CATCH  Final   Special Requests NONE  Final   Culture >=100,000 COLONIES/mL ESCHERICHIA COLI  Final   Report Status 09/21/2015 FINAL  Final   Organism ID, Bacteria ESCHERICHIA COLI  Final      Susceptibility   Escherichia coli - MIC*    AMPICILLIN <=2 SENSITIVE Sensitive     CEFAZOLIN <=4 SENSITIVE Sensitive     CEFTRIAXONE <=1 SENSITIVE Sensitive     CIPROFLOXACIN <=0.25 SENSITIVE Sensitive     GENTAMICIN <=1 SENSITIVE Sensitive     IMIPENEM <=0.25 SENSITIVE Sensitive     NITROFURANTOIN <=16 SENSITIVE Sensitive     TRIMETH/SULFA <=20 SENSITIVE Sensitive     AMPICILLIN/SULBACTAM <=2 SENSITIVE Sensitive     PIP/TAZO <=4 SENSITIVE Sensitive     * >=100,000 COLONIES/mL ESCHERICHIA COLI    [x]  Patient discharged originally without antimicrobial agent and treatment is now indicated  New antibiotic prescription: Bactrim 1 DS tablet PO BID x 3 days  ED Provider: Montine Circle PA-C   Reginia Naas 09/22/2015, 8:48 AM Infectious Diseases Pharmacist Phone# [phone]

## 2015-09-27 ENCOUNTER — Ambulatory Visit (HOSPITAL_COMMUNITY): Payer: Medicare Other | Attending: Family Medicine

## 2015-09-27 ENCOUNTER — Other Ambulatory Visit: Payer: Self-pay

## 2015-09-27 DIAGNOSIS — Z8249 Family history of ischemic heart disease and other diseases of the circulatory system: Secondary | ICD-10-CM | POA: Diagnosis not present

## 2015-09-27 DIAGNOSIS — E785 Hyperlipidemia, unspecified: Secondary | ICD-10-CM | POA: Insufficient documentation

## 2015-09-27 DIAGNOSIS — I10 Essential (primary) hypertension: Secondary | ICD-10-CM | POA: Diagnosis not present

## 2015-09-27 DIAGNOSIS — R011 Cardiac murmur, unspecified: Secondary | ICD-10-CM

## 2015-09-27 DIAGNOSIS — I517 Cardiomegaly: Secondary | ICD-10-CM | POA: Insufficient documentation

## 2015-10-02 ENCOUNTER — Telehealth: Payer: Self-pay | Admitting: *Deleted

## 2015-10-02 DIAGNOSIS — L74519 Primary focal hyperhidrosis, unspecified: Secondary | ICD-10-CM

## 2015-10-02 NOTE — Telephone Encounter (Signed)
Pt calls and wants to know if MD will refer her to an endocrinologist, "since this is what the ER MD suggested when I was there".  She would prefer not to make an appointment her if not necessary. [name], [name]

## 2015-10-03 DIAGNOSIS — L74519 Primary focal hyperhidrosis, unspecified: Secondary | ICD-10-CM | POA: Insufficient documentation

## 2015-10-03 NOTE — Assessment & Plan Note (Signed)
Called patient.  See notes under referral to endo.  An ER doctor told her she needed an endocrinologist for her vague symptoms and now I can't talk her out of that thought.  I have referred even though I will be surprised if any hormonal problems are found.

## 2015-10-26 ENCOUNTER — Encounter: Payer: Self-pay | Admitting: Family Medicine

## 2015-10-26 ENCOUNTER — Telehealth: Payer: Self-pay | Admitting: Family Medicine

## 2015-10-26 ENCOUNTER — Ambulatory Visit (INDEPENDENT_AMBULATORY_CARE_PROVIDER_SITE_OTHER): Payer: Medicare Other | Admitting: Family Medicine

## 2015-10-26 VITALS — BP 118/95 | HR 77 | Temp 98.1°F | Ht 65.0 in | Wt 217.5 lb

## 2015-10-26 DIAGNOSIS — F909 Attention-deficit hyperactivity disorder, unspecified type: Secondary | ICD-10-CM | POA: Diagnosis not present

## 2015-10-26 DIAGNOSIS — F319 Bipolar disorder, unspecified: Secondary | ICD-10-CM | POA: Diagnosis not present

## 2015-10-26 DIAGNOSIS — F988 Other specified behavioral and emotional disorders with onset usually occurring in childhood and adolescence: Secondary | ICD-10-CM

## 2015-10-26 DIAGNOSIS — M5010 Cervical disc disorder with radiculopathy, unspecified cervical region: Secondary | ICD-10-CM | POA: Diagnosis not present

## 2015-10-26 DIAGNOSIS — M75102 Unspecified rotator cuff tear or rupture of left shoulder, not specified as traumatic: Secondary | ICD-10-CM | POA: Diagnosis not present

## 2015-10-26 DIAGNOSIS — M7501 Adhesive capsulitis of right shoulder: Secondary | ICD-10-CM

## 2015-10-26 MED ORDER — AMPHETAMINE-DEXTROAMPHETAMINE 10 MG PO TABS: 10.0000 mg | ORAL_TABLET | Freq: Every day | ORAL | Status: DC

## 2015-10-26 MED ORDER — GABAPENTIN 100 MG PO CAPS: 100.0000 mg | ORAL_CAPSULE | Freq: Three times a day (TID) | ORAL | Status: DC

## 2015-10-26 NOTE — Telephone Encounter (Signed)
Copied mailed to patient.

## 2015-10-26 NOTE — Assessment & Plan Note (Signed)
Mild and early.  Start PT esp since so limited with right shoulder.

## 2015-10-26 NOTE — Assessment & Plan Note (Signed)
Feel she has some radiculopathy contributing to her left shoulder and chest pain.  Start gabapentin.

## 2015-10-26 NOTE — Patient Instructions (Signed)
Physical therapy will call.   I sent in a new nerve pain medication.   See me in one month to see if you are improving.   Because you are still so healthy, I do recommend continue to get mammograms. I hope the endocrinologist finds something to treat.

## 2015-10-26 NOTE — Assessment & Plan Note (Signed)
Hypomanic today but rational throughout.

## 2015-10-26 NOTE — Assessment & Plan Note (Signed)
PT can also work on adhesive capsulitis.  Consider referral to sports med for large volume shoulder injection.

## 2015-10-26 NOTE — Progress Notes (Signed)
   Subjective:    Patient ID: [name], female    DOB: 17-Mar-1931, 82 y.o.   MRN: [id number]  HPI Multiple complaints, the main one of which is new left shoulder pain.  Issues: Her bipolar is active today and she is hypomanic.  Example, after we had wrapped up, she came to my office and asked three more questions.  Then called back after she got home wanting a copy of her echocardiogram.  She is rational throughout, just bounces from topic to topic. Chronic right shoulder problems, has seen ortho, who have recommended replacement surgery.  She declines and is living with significant limitations. Left should pain is new, present for about 2 weeks.  No trauma or unusual activity. Also has known cervical spine disease.  She has some moderately longstanding vague left anterior chest pain, which she thinks may be related to her neck problems.  I reviewed CT of 11/24/12. Health maint - due for mammogram, which I recommend despite age 6 due to good general physical health.  She also has an appointment next week to endocrinology per her request to evaluate any hormonal cause for excessive sweating.       Review of Systems     Objective:   Physical Exam  Right shoulder, marked limited movement.  Seems to have frozen shoulder.   Left shoulder Mildly positive impingement and empty can tests.  Good ROM. Neck, limited ROM looking to left and extension.   Upper extremities, normal reflexes and sensation.        Assessment & Plan:

## 2015-10-26 NOTE — Telephone Encounter (Signed)
Want the cardiologist echo cardiogram report mailed to her.

## 2015-10-30 ENCOUNTER — Ambulatory Visit: Payer: Medicare Other | Attending: Family Medicine | Admitting: Physical Therapy

## 2015-10-30 NOTE — Telephone Encounter (Signed)
Noted  

## 2015-10-30 NOTE — Telephone Encounter (Signed)
Patient wanted to inform provider that the hctz doesn't have the same components as losartan.  Also called [facility] and have been approved to participate in a study for inflammation.  The gabapentin that she was prescribed to take 3 x daily has been helpful with her mobility.  Doesn't seem to have that "hitch" in her gait.

## 2015-11-01 ENCOUNTER — Ambulatory Visit (INDEPENDENT_AMBULATORY_CARE_PROVIDER_SITE_OTHER): Payer: Medicare Other | Admitting: Endocrinology

## 2015-11-01 ENCOUNTER — Encounter: Payer: Self-pay | Admitting: Endocrinology

## 2015-11-01 ENCOUNTER — Other Ambulatory Visit: Payer: Self-pay | Admitting: Endocrinology

## 2015-11-01 VITALS — BP 110/74 | HR 68 | Temp 98.3°F | Resp 16 | Ht 65.0 in | Wt 217.2 lb

## 2015-11-01 DIAGNOSIS — R5383 Other fatigue: Secondary | ICD-10-CM | POA: Diagnosis not present

## 2015-11-01 DIAGNOSIS — R61 Generalized hyperhidrosis: Secondary | ICD-10-CM | POA: Diagnosis not present

## 2015-11-01 MED ORDER — CLONIDINE HCL 0.1 MG/24HR TD PTWK: 0.1000 mg | MEDICATED_PATCH | TRANSDERMAL | Status: DC

## 2015-11-01 NOTE — Progress Notes (Signed)
Patient ID: [name], female   DOB: 1930-10-26, 82 y.o.   MRN: [id number]             Chief complaint:  Excessive sweating  History of Present Illness:   The patient states both for the last year or so she has had increased sweating Sweating occurs mostly when she is doing any physical activity, even when moving around her home or getting dressed.  She starts getting significant amount of sweating starting from her head and face and also upper trunk.   The patient is concerned about the symptoms and is also upset by the embarrassment caused by the symptoms.   Whenever she goes shopping she starts profuse sweating.   At night she may occasionally wake up with her clothes wet if she is getting up to go to the bathroom.    She does not have any other associated symptoms with this.  She may occasionally have flushing of her face but this does not always occur at the time of sweating.  At times will feel warm in her neck and face area. She has been evaluated with thyroid function tests in 2/17 which were normal    Past Medical History  Diagnosis Date  . Depression     sucide attempt in the distant past  . Ovarian cyst   . Obesity   . HTN (hypertension)   . HLD (hyperlipidemia)   . Seizures ([place]) 1980s    2/2 meningioma. none since cranioplasty  . Bipolar disorder ([place])   . Obesity   . Hiatal hernia     s/p repair  . GI bleed   . AVM (arteriovenous malformation)   . Low back pain     ESI by Dr. Nelva Bush 2 weeks ago  . OA (osteoarthritis)     bilateral wrist, bilateral knees  . Rotator cuff arthropathy     right  . Right wrist injury     multiple surgeries and residual weakness.     Past Surgical History  Procedure Laterality Date  . Hiatal hernia repair    . Breast reduction surgery    . Cranioplasty      2/2 meningioma 1980s  . Knee arthroplasty      bilat  . Cataract extraction Bilateral   . Left heart catheterization with coronary angiogram N/A 03/26/2012   Procedure: LEFT HEART CATHETERIZATION WITH CORONARY ANGIOGRAM;  Surgeon: Minus Breeding, MD;  Location: [facility] CATH LAB;  Service: Cardiovascular;  Laterality: N/A;    Family History  Problem Relation Age of Onset  . Alzheimer's disease Mother   . Heart disease Father     MI 25yo  . Heart disease Brother     Social History:  reports that she has never smoked. She has never used smokeless tobacco. She reports that she does not drink alcohol or use illicit drugs.  Allergies:  Allergies  Allergen Reactions  . Amoxicillin Anaphylaxis    REACTION: unspecified  . Penicillins Anaphylaxis  . Morphine Sulfate Other (See Comments)    unknown  . Phenobarbital Other (See Comments)    unknown  . Phenytoin Other (See Comments)    unknown      Medication List       This list is accurate as of: 11/01/15 11:59 PM.  Always use your most recent med list.               acyclovir ointment 5 %  Commonly known as:  [place] 3  HOURS     amphetamine-dextroamphetamine 10 MG tablet  Commonly known as:  ADDERALL  Take 1 tablet (10 mg total) by mouth daily.     aspirin 81 MG tablet  Take 1 tablet (81 mg total) by mouth daily.     atorvastatin 10 MG tablet  Commonly known as:  LIPITOR  TAKE 1 TABLET BY MOUTH EVERY NIGHT AT BEDTIME     cloNIDine 0.1 mg/24hr patch  Commonly known as:  CATAPRES - Dosed in mg/24 hr  APPLY 1 PATCH(0.1 MG) EXTERNALLY TO THE SKIN 1 TIME A WEEK     FLUoxetine 40 MG capsule  Commonly known as:  PROZAC  Take 1 capsule (40 mg total) by mouth daily.     gabapentin 100 MG capsule  Commonly known as:  NEURONTIN  Take 1 capsule (100 mg total) by mouth 3 (three) times daily.     hydrochlorothiazide 12.5 MG capsule  Commonly known as:  MICROZIDE  TAKE ONE CAPSULE BY MOUTH DAILY     ibuprofen 200 MG tablet  Commonly known as:  ADVIL,MOTRIN  Take 200 mg by mouth every 6 (six) hours as needed.     metoprolol succinate 25 MG 24 hr tablet    Commonly known as:  TOPROL-XL  Take 1 tablet (25 mg total) by mouth daily.     metroNIDAZOLE 0.75 % gel  Commonly known as:  METROGEL  Apply 1 application topically 2 (two) times daily.     ondansetron 4 MG disintegrating tablet  Commonly known as:  ZOFRAN ODT  Take 1 tablet (4 mg total) by mouth every 8 (eight) hours as needed for nausea or vomiting.        LABS:     REVIEW OF SYSTEMS:        Review of Systems  Constitutional: Negative for weight loss.       She says she is losing the sense of taste for various foods and also had decreased sense of smell  HENT: Negative for headaches.   Eyes: Negative for blurred vision.  Respiratory: Positive for shortness of breath.   Cardiovascular: Negative for leg swelling.  Gastrointestinal: Negative for diarrhea.  Endocrine: Positive for fatigue and polydipsia.       She tends to get thirsty.  However not drinking water during the night  Genitourinary: Positive for nocturia.       Getting overnight once  Skin: Positive for rash.       rosacea  Neurological: Negative for numbness and tingling.  Psychiatric/Behavioral: Positive for depressed mood.       Low motivation present, she has long history of depression.  She feels better with using Adderall     PHYSICAL EXAM:  BP 110/74 mmHg  Pulse 68  Temp(Src) 98.3 F (36.8 C)  Resp 16  Ht 5\' 5"  (1.651 m)  Wt 217 lb 3.2 oz (98.521 kg)  BMI 36.14 kg/m2  SpO2 98%  GENERAL: She has mild generalized obesity  No pallor, clubbing, lymphadenopathy or edema.  Skin:  no rash or pigmentation.  EYES:  Externally normal.   ENT: Oral mucosa and tongue normal.  THYROID:  Not palpable.  HEART:  Normal  S1 and S2; no murmur or click.  CHEST:  Normal shape.  Lungs: Vescicular breath sounds heard equally.  No crepitations/ wheeze.  ABDOMEN:  No distention.  Liver and spleen not palpable.  No other mass or tenderness.  NEUROLOGICAL: .Reflexes are normal bilaterally at  biceps  JOINTS:  Normal.  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  Her symptoms are precipitated by increased physical activity but also can occur overnight.  She does also have some unusual symptoms of decreased taste and decreased sense of smell of unclear etiology.  Although she has had a remote history of meningioma this was more in the occipital right labia and should not affect pituitary function.  Because of her significant fatigue will evaluate pituitary gland function with FSH and IGF-I levels.  If abnormal will consider MRI of pituitary gland  She should benefit from symptomatic treatment of this and the most effective drop would be clonidine.  Since she has hypertension she may be able to take this in place of her HCTZ.  Also to provide more consistent dosage and better tolerability she can try the CLONIDINE patch, 0.1 mg.  Discussed side effects of this including dry mouth, dizziness, decreased blood pressure and drowsiness.  She does know how to check her blood pressure and will do this daily     Kyal Arts 11/02/2015, 8:04 AM

## 2015-11-01 NOTE — Patient Instructions (Signed)
Check BP daily  Stop Hctz  Patch tomorrow nite

## 2015-11-03 LAB — FOLLICLE STIMULATING HORMONE: FSH: 50.2 m[IU]/mL

## 2015-11-03 LAB — INSULIN-LIKE GROWTH FACTOR: INSULIN LIKE GF 1: 100 ng/mL (ref 34–172)

## 2015-11-07 ENCOUNTER — Other Ambulatory Visit: Payer: Self-pay | Admitting: Family Medicine

## 2015-11-07 NOTE — Telephone Encounter (Signed)
Need refill atorvastatin.

## 2015-11-08 MED ORDER — ATORVASTATIN CALCIUM 10 MG PO TABS: 10.0000 mg | ORAL_TABLET | Freq: Every day | ORAL | Status: DC

## 2015-11-11 ENCOUNTER — Other Ambulatory Visit: Payer: Self-pay | Admitting: Family Medicine

## 2015-12-04 ENCOUNTER — Telehealth: Payer: Self-pay | Admitting: *Deleted

## 2015-12-04 DIAGNOSIS — K5732 Diverticulitis of large intestine without perforation or abscess without bleeding: Secondary | ICD-10-CM

## 2015-12-04 MED ORDER — CIPROFLOXACIN HCL 500 MG PO TABS: 500.0000 mg | ORAL_TABLET | Freq: Two times a day (BID) | ORAL | Status: DC

## 2015-12-04 MED ORDER — METRONIDAZOLE 500 MG PO TABS: 500.0000 mg | ORAL_TABLET | Freq: Two times a day (BID) | ORAL | Status: AC

## 2015-12-04 NOTE — Telephone Encounter (Signed)
Patient calling stating she is having a diverticulitis flare since Friday with pain and nausea, offered appointment but patietn states that it is documented in her chart that she has this and just wants antibiotic called in, wants to know if MD could call in an antibiotic to [facility] on [address].

## 2015-12-04 NOTE — Telephone Encounter (Signed)
Rx sent.  Discussed with patient.  No fever, tolerating clears.  Knows to follow up if worsens

## 2015-12-04 NOTE — Telephone Encounter (Signed)
Patient is calling once again requesting antibiotic for a diverticulitis flare up since Friday.  Asked patient if she tried to go to ED or urgent care if she was in a lot of pain and fever.  Patient stated she did not and will not go to a doctor unless she really have to.  Patient state this is her fourth flare up.  She is requesting the antibiotic ASAP.  It should be documented in her record that she has this condition and it was diagnosed by a GI doctor.  Patient does not want PCP to call her.  She just want her medications.  Advised patient again that might be chance the provider request she come in for an appointment.  Patient again refused to be seen in clinic.  Please advise.  Derl Barrow, RN

## 2015-12-05 ENCOUNTER — Ambulatory Visit (INDEPENDENT_AMBULATORY_CARE_PROVIDER_SITE_OTHER): Payer: Medicare Other | Admitting: Endocrinology

## 2015-12-05 ENCOUNTER — Encounter: Payer: Self-pay | Admitting: Endocrinology

## 2015-12-05 VITALS — BP 138/77 | HR 60 | Temp 98.2°F | Resp 16 | Ht 65.0 in | Wt 213.8 lb

## 2015-12-05 DIAGNOSIS — R61 Generalized hyperhidrosis: Secondary | ICD-10-CM

## 2015-12-05 NOTE — Progress Notes (Signed)
Patient ID: [name], female   DOB: 06/21/1931, 82 y.o.   MRN: [id number]             Chief complaint:  Excessive sweating  History of Present Illness:   She was seen in consultation in 3/17 for problems with  increased sweating She gave the following history: Sweating occurs mostly when she is doing any physical activity, even when moving around her home or getting dressed.  She starts getting significant amount of sweating starting from her head and face and also upper trunk.   The patient is concerned about the symptoms and is also upset by the embarrassment caused by the symptoms.   Whenever she goes shopping she starts profuse sweating.   At night she may occasionally wake up with her clothes wet if she is getting up to go to the bathroom.    She may occasionally have flushing of her face but this does not always occur at the time of sweating.  At times will feel warm in her neck and face area.  She has been evaluated with thyroid and pituitary function tests and these were normal.  Since she was felt to have idiopathic sweating she was given a trial of Catapres patch 0.1 mg weekly instead of her HCTZ for hypertension She says that with this her sweating has decreased considerably and she has only minor amount of sweating when she is going shopping or occasionally when she gets up at night.  Also does not think she feels as much flushing or head rushing when she has the sweating. No side effects like dizziness are dry mouth with this treatment She thinks her blood pressure is 140/90 at home     Past Medical History  Diagnosis Date  . Depression     sucide attempt in the distant past  . Ovarian cyst   . Obesity   . HTN (hypertension)   . HLD (hyperlipidemia)   . Seizures ([place]) 1980s    2/2 meningioma. none since cranioplasty  . Bipolar disorder ([place])   . Obesity   . Hiatal hernia     s/p repair  . GI bleed   . AVM (arteriovenous malformation)   . Low back pain     ESI by Dr. Nelva Bush 2 weeks ago  . OA (osteoarthritis)     bilateral wrist, bilateral knees  . Rotator cuff arthropathy     right  . Right wrist injury     multiple surgeries and residual weakness.     Past Surgical History  Procedure Laterality Date  . Hiatal hernia repair    . Breast reduction surgery    . Cranioplasty      2/2 meningioma 1980s  . Knee arthroplasty      bilat  . Cataract extraction Bilateral   . Left heart catheterization with coronary angiogram N/A 03/26/2012    Procedure: LEFT HEART CATHETERIZATION WITH CORONARY ANGIOGRAM;  Surgeon: Minus Breeding, MD;  Location: [facility] CATH LAB;  Service: Cardiovascular;  Laterality: N/A;    Family History  Problem Relation Age of Onset  . Alzheimer's disease Mother   . Heart disease Father     MI 52yo  . Heart disease Brother     Social History:  reports that she has never smoked. She has never used smokeless tobacco. She reports that she does not drink alcohol or use illicit drugs.  Allergies:  Allergies  Allergen Reactions  . Amoxicillin Anaphylaxis    REACTION: unspecified  . Penicillins  Anaphylaxis  . Morphine Sulfate Other (See Comments)    unknown  . Phenobarbital Other (See Comments)    unknown  . Phenytoin Other (See Comments)    unknown      Medication List       This list is accurate as of: 12/05/15 11:02 AM.  Always use your most recent med list.               acyclovir ointment 5 %  Commonly known as:  ZOVIRAX  APPLY TOPICALLY EVERY 3 HOURS     amphetamine-dextroamphetamine 10 MG tablet  Commonly known as:  ADDERALL  Take 1 tablet (10 mg total) by mouth daily.     aspirin 81 MG tablet  Take 1 tablet (81 mg total) by mouth daily.     atorvastatin 10 MG tablet  Commonly known as:  LIPITOR  TAKE 1 TABLET BY MOUTH EVERY NIGHT AT BEDTIME     ciprofloxacin 500 MG tablet  Commonly known as:  CIPRO  Take 1 tablet (500 mg total) by mouth 2 (two) times daily.     cloNIDine 0.1 mg/24hr patch    Commonly known as:  CATAPRES - Dosed in mg/24 hr  APPLY 1 PATCH(0.1 MG) EXTERNALLY TO THE SKIN 1 TIME A WEEK     FLUoxetine 40 MG capsule  Commonly known as:  PROZAC  Take 1 capsule (40 mg total) by mouth daily.     gabapentin 100 MG capsule  Commonly known as:  NEURONTIN  Take 1 capsule (100 mg total) by mouth 3 (three) times daily.     ibuprofen 200 MG tablet  Commonly known as:  ADVIL,MOTRIN  Take 200 mg by mouth every 6 (six) hours as needed.     metoprolol succinate 25 MG 24 hr tablet  Commonly known as:  TOPROL-XL  Take 1 tablet (25 mg total) by mouth daily.     metroNIDAZOLE 0.75 % gel  Commonly known as:  METROGEL  Apply 1 application topically 2 (two) times daily.     metroNIDAZOLE 500 MG tablet  Commonly known as:  FLAGYL  Take 1 tablet (500 mg total) by mouth 2 (two) times daily.     ondansetron 4 MG disintegrating tablet  Commonly known as:  ZOFRAN ODT  Take 1 tablet (4 mg total) by mouth every 8 (eight) hours as needed for nausea or vomiting.        Review of Systems   PHYSICAL EXAM:  BP 138/77 mmHg  Pulse 60  Temp(Src) 98.2 F (36.8 C)  Resp 16  Ht 5\' 5"  (1.651 m)  Wt 213 lb 12.8 oz (96.979 kg)  BMI 35.58 kg/m2  SpO2 98%  Manual blood pressure standing 0000000 systolic, diastolic not clearly discernible and blood pressure checked with automatic instrument   ASSESSMENT/PLAN:  Idiopathic sweating episodes with history of hypertension She has significant improvement in her symptoms with using her clonidine 0.1 mg patch Although she is requesting a higher dose since her blood pressure is appearing normal with a slight decrease standing up will not increase the dose considering her age   [facility] 12/05/2015, 11:02 AM

## 2015-12-14 ENCOUNTER — Encounter: Payer: Self-pay | Admitting: Family Medicine

## 2015-12-14 ENCOUNTER — Ambulatory Visit (INDEPENDENT_AMBULATORY_CARE_PROVIDER_SITE_OTHER): Payer: Medicare Other | Admitting: Family Medicine

## 2015-12-14 VITALS — BP 145/82 | HR 61 | Temp 98.4°F | Wt 215.1 lb

## 2015-12-14 DIAGNOSIS — B373 Candidiasis of vulva and vagina: Secondary | ICD-10-CM | POA: Diagnosis not present

## 2015-12-14 DIAGNOSIS — L74519 Primary focal hyperhidrosis, unspecified: Secondary | ICD-10-CM

## 2015-12-14 DIAGNOSIS — M75102 Unspecified rotator cuff tear or rupture of left shoulder, not specified as traumatic: Secondary | ICD-10-CM

## 2015-12-14 DIAGNOSIS — R300 Dysuria: Secondary | ICD-10-CM | POA: Diagnosis not present

## 2015-12-14 DIAGNOSIS — M7501 Adhesive capsulitis of right shoulder: Secondary | ICD-10-CM

## 2015-12-14 DIAGNOSIS — I10 Essential (primary) hypertension: Secondary | ICD-10-CM

## 2015-12-14 DIAGNOSIS — B3731 Acute candidiasis of vulva and vagina: Secondary | ICD-10-CM | POA: Insufficient documentation

## 2015-12-14 DIAGNOSIS — F319 Bipolar disorder, unspecified: Secondary | ICD-10-CM | POA: Diagnosis not present

## 2015-12-14 LAB — POCT URINALYSIS DIPSTICK
BILIRUBIN UA: NEGATIVE
GLUCOSE UA: NEGATIVE
Ketones, UA: NEGATIVE
Nitrite, UA: NEGATIVE
Protein, UA: NEGATIVE
RBC UA: NEGATIVE
SPEC GRAV UA: 1.02
Urobilinogen, UA: 0.2
pH, UA: 6.5

## 2015-12-14 LAB — POCT UA - MICROSCOPIC ONLY

## 2015-12-14 MED ORDER — FLUCONAZOLE 150 MG PO TABS: 150.0000 mg | ORAL_TABLET | Freq: Once | ORAL | Status: DC

## 2015-12-14 MED ORDER — GABAPENTIN 300 MG PO CAPS: 300.0000 mg | ORAL_CAPSULE | Freq: Three times a day (TID) | ORAL | Status: DC

## 2015-12-14 MED ORDER — METOPROLOL SUCCINATE ER 25 MG PO TB24: 25.0000 mg | ORAL_TABLET | Freq: Every day | ORAL | Status: DC

## 2015-12-14 NOTE — Patient Instructions (Signed)
I sent in three medications. Diflucan for the yeast infection Refilled the metoprolol at the 25 mg dose The higher dose of the gabapentin 300 mg I will fax Dr. Veverly Fells a release for cardiology clearance.

## 2015-12-15 NOTE — Progress Notes (Signed)
   Subjective:    Patient ID: [name], female    DOB: Dec 04, 1930, 82 y.o.   MRN: [id number]  HPI [name] comes in and as usual, it is a bit difficult to keep up with her tangential history.  Issues are: 1. She has completed treatment for a UTI.  Still has some dysuria.  Also minimal vag discharge and itching. "I know I have a yeast infection." 2. Feels as if she is on too many medications.  Then says the gabapentin works well for her and wants to try a higher dose.  I did thorough med review with her. 3. Adhesive capsulitis/rotator cuff syndrome.  Plans surgery with Dr. Veverly Avery.  He needs medical clearance.  I had obtained an echo pursuit to that goal.  Echo is reassuring and I will clear her for surgery.   4. She has been to endo and has gotten some relief from her hot flashes with clonidine.      Review of Systems     Objective:   Physical Exam Very limited ROM of right shoulder. Cardiac 2/6 SEM. Lungs clear Abd benign.        Assessment & Plan:

## 2015-12-15 NOTE — Assessment & Plan Note (Signed)
Will treat emperically based on recent antibiotics.

## 2015-12-15 NOTE — Assessment & Plan Note (Signed)
Medically cleared.  I will fax this note and echo to Dr. Veverly Fells.

## 2015-12-15 NOTE — Assessment & Plan Note (Signed)
Nicely treated with clonidine.

## 2015-12-15 NOTE — Assessment & Plan Note (Signed)
Medically cleared for surg.

## 2015-12-15 NOTE — Assessment & Plan Note (Signed)
Repeat BP acceptable.

## 2015-12-15 NOTE — Assessment & Plan Note (Signed)
Seemingly stable on current meds.

## 2015-12-17 ENCOUNTER — Encounter: Payer: Self-pay | Admitting: Family Medicine

## 2015-12-17 DIAGNOSIS — Z13820 Encounter for screening for osteoporosis: Secondary | ICD-10-CM

## 2015-12-17 DIAGNOSIS — E2839 Other primary ovarian failure: Secondary | ICD-10-CM

## 2015-12-19 ENCOUNTER — Telehealth: Payer: Self-pay | Admitting: Cardiovascular Disease

## 2015-12-19 ENCOUNTER — Telehealth: Payer: Self-pay | Admitting: Family Medicine

## 2015-12-19 NOTE — Telephone Encounter (Signed)
Pt calling returning a call to Dr. Vonna Kotyk -she thinks-her neighbor left her a message that a doc wanted to see her and left [phone] the pt only remembers the last name started with a C-I don't see any message that we called however-

## 2015-12-19 NOTE — Telephone Encounter (Signed)
Called and discussed.  My office did not initiate the call.  I suspect this was just a reminder for an annual check by the cardiologist.

## 2015-12-19 NOTE — Telephone Encounter (Signed)
Spoke with pt, she has figured out that it was dr hochrein she had seen in the past, 2013. She is needing clearance for reverse shoulder surgery and the pt reports dr Andria Frames has referred her back to our group to get clearance. She prefers to see someone at the [address] location, she does not like friendly. Will forward to the schedulers to contact the pt.

## 2015-12-19 NOTE — Telephone Encounter (Signed)
Please call Ms. [name] regarding a phone call she received from the cardiologist office.  Wanted to know if you had spoke with someone there about her being seen.

## 2015-12-21 ENCOUNTER — Telehealth: Payer: Self-pay | Admitting: Family Medicine

## 2015-12-21 MED ORDER — FLUCONAZOLE 100 MG PO TABS: 100.0000 mg | ORAL_TABLET | Freq: Every day | ORAL | Status: DC

## 2015-12-21 NOTE — Telephone Encounter (Signed)
Patient states that she has a yeast inf and needs something called in.

## 2016-01-18 ENCOUNTER — Telehealth: Payer: Self-pay | Admitting: *Deleted

## 2016-01-18 DIAGNOSIS — M7501 Adhesive capsulitis of right shoulder: Secondary | ICD-10-CM

## 2016-01-18 NOTE — Telephone Encounter (Signed)
Pt wants dr Andria Frames to call her because she has some important issues to discuss with him. Please contact her as soon as possible. Deseree Kennon Holter, CMA

## 2016-01-18 NOTE — Telephone Encounter (Signed)
Wants preop PT on right shoulder.  Concerned about planned Aug 18 surg.  She lives alone and is worried about ability to care for self in immediate post op period.

## 2016-01-30 ENCOUNTER — Ambulatory Visit: Payer: Medicare Other | Attending: Family Medicine | Admitting: Physical Therapy

## 2016-01-30 ENCOUNTER — Encounter: Payer: Self-pay | Admitting: Physical Therapy

## 2016-01-30 DIAGNOSIS — R262 Difficulty in walking, not elsewhere classified: Secondary | ICD-10-CM | POA: Diagnosis not present

## 2016-01-30 DIAGNOSIS — M25511 Pain in right shoulder: Secondary | ICD-10-CM | POA: Insufficient documentation

## 2016-01-30 DIAGNOSIS — M6281 Muscle weakness (generalized): Secondary | ICD-10-CM | POA: Diagnosis not present
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
## 2016-02-08 DIAGNOSIS — C44722 Squamous cell carcinoma of skin of right lower limb, including hip: Secondary | ICD-10-CM | POA: Diagnosis not present

## 2016-02-08 DIAGNOSIS — L718 Other rosacea: Secondary | ICD-10-CM | POA: Diagnosis not present

## 2016-02-08 DIAGNOSIS — Z85828 Personal history of other malignant neoplasm of skin: Secondary | ICD-10-CM | POA: Diagnosis not present

## 2016-02-08 DIAGNOSIS — D485 Neoplasm of uncertain behavior of skin: Secondary | ICD-10-CM | POA: Diagnosis not present

## 2016-02-20 ENCOUNTER — Encounter: Payer: Self-pay | Admitting: Physical Therapy

## 2016-02-20 ENCOUNTER — Ambulatory Visit: Payer: Medicare Other | Attending: Family Medicine | Admitting: Physical Therapy

## 2016-02-20 DIAGNOSIS — M6281 Muscle weakness (generalized): Secondary | ICD-10-CM | POA: Diagnosis not present

## 2016-02-20 DIAGNOSIS — R262 Difficulty in walking, not elsewhere classified: Secondary | ICD-10-CM | POA: Diagnosis not present

## 2016-02-20 DIAGNOSIS — M25511 Pain in right shoulder: Secondary | ICD-10-CM

## 2016-02-20 NOTE — Therapy (Signed)
[place], [place], [address] Phone: [phone]   Fax:  [phone]  Physical Therapy Treatment  Patient Details  Name: [name] MRN: [id number] Date of Birth: 1930-10-25 Referring Provider: [name], MD (Dr. Veverly Fells scheduled to perform reverse TSA)  Encounter Date: 02/20/2016      PT End of Session - 02/20/16 1016    Visit Number 2   Number of Visits 13   Date for PT Re-Evaluation 03/15/16   Authorization Type medicare KX at 15   PT Start Time 1016   PT Stop Time 1059   PT Time Calculation (min) 43 min      Past Medical History  Diagnosis Date  . Depression     sucide attempt in the distant past  . Ovarian cyst   . Obesity   . HTN (hypertension)   . HLD (hyperlipidemia)   . Seizures ([place]) 1980s    2/2 meningioma. none since cranioplasty  . Bipolar disorder ([place])   . Obesity   . Hiatal hernia     s/p repair  . GI bleed   . AVM (arteriovenous malformation)   . Low back pain     ESI by Dr. Nelva Bush 2 weeks ago  . OA (osteoarthritis)     bilateral wrist, bilateral knees  . Rotator cuff arthropathy     right  . Right wrist injury     multiple surgeries and residual weakness.     Past Surgical History  Procedure Laterality Date  . Hiatal hernia repair    . Breast reduction surgery    . Cranioplasty      2/2 meningioma 1980s  . Knee arthroplasty      bilat  . Cataract extraction Bilateral   . Left heart catheterization with coronary angiogram N/A 03/26/2012    Procedure: LEFT HEART CATHETERIZATION WITH CORONARY ANGIOGRAM;  Surgeon: Minus Breeding, MD;  Location: [facility] CATH LAB;  Service: Cardiovascular;  Laterality: N/A;    There were no vitals filed for this visit.      Subjective Assessment - 02/20/16 1020    Subjective Pt reprots shoulder feels about the same, only hurts when accidently moves it. Would like to be discharged to independent home program for shoulder. Pt  believes she is capable of exercising shoulder independently and would like to seek treatment for balance and dizziness.    Patient Stated Goals don deoderant, wash dishes, moving arm, "it doesn't have to be perfect, just a little better"   Currently in Pain? No/denies   Pain Score --  only if I move it                         [facility] Adult PT Treatment/Exercise - 02/20/16 0001    Exercises   Exercises Shoulder   Shoulder Exercises: Seated   Other Seated Exercises elbow flexion with isometric triceps hold, yellow tband   Shoulder Exercises: Isometric Strengthening   Flexion 5X5"   Extension 5X5"   External Rotation 5X5"   Internal Rotation 5X5"   ABduction 5X5"   ADduction 5X5"   Modalities   Modalities Cryotherapy   Cryotherapy   Number Minutes Cryotherapy --  during treatment due to warmth   Cryotherapy Location Cervical   Type of Cryotherapy Ice pack                PT Education - 02/20/16 1205    Education provided Yes   Education  Details exercise form/rationale, HEP   Person(s) Educated Patient   Methods Explanation;Demonstration;Tactile cues;Verbal cues;Handout   Comprehension Verbalized understanding;Returned demonstration;Verbal cues required;Tactile cues required          PT Short Term Goals - 02/26/2016 1208    PT SHORT TERM GOAL #1   Title verbalized independence in HEP as it has been established by Feb 26, 2023   Status Achieved   PT SHORT TERM GOAL #2   Title pt will verbalize noticable improvements in shoulder.    Status Not Met           PT Long Term Goals - 02/26/2016 1209    PT LONG TERM GOAL #1   Title pt will be able to don deoderant with minimal difficulty by 8/4   Baseline not met due to early d/c   Status Not Met   PT LONG TERM GOAL #2   Title Pt will be able to use RUE to help wash dishes   Baseline not met due to early d/c   Status Not Met   PT LONG TERM GOAL #3   Title improve flexion ROM by 15 deg to improve functional  use of arm.   Baseline not met due to early d/c   PT LONG TERM GOAL #4   Title FOTO to improve from 19% ability to 30% ability   Baseline not met due to early d/c   Status Not Met               Plan - 02-26-2016 1206    Clinical Impression Statement Pt has not cancelled her surgery and is still trying to figure out logistics. Per pt request, she has been discharged to independent program for shoulder. Was provided with HEP printout which she was able to demonstrate and verbalized comfort and understanding. Was instructed to contact us with any further needs or questions.    PT Home Exercise Plan scapular retractions, pendulums, isometrics 6-ways, biceps curl against iso triceps hold   Consulted and Agree with Plan of Care Patient      Patient will benefit from skilled therapeutic intervention in order to improve the following deficits and impairments:     Visit Diagnosis: Pain in right shoulder  Muscle weakness (generalized)  Difficulty in walking, not elsewhere classified       G-Codes - 02/26/16 1210    Functional Assessment Tool Used FOTO 81% impaired   Functional Limitation Carrying, moving and handling objects   Carrying, Moving and Handling Objects Current Status (O9629) At least 80 percent but less than 100 percent impaired, limited or restricted   Carrying, Moving and Handling Objects Goal Status (B2841) At least 60 percent but less than 80 percent impaired, limited or restricted   Carrying, Moving and Handling Objects Discharge Status [phone]) At least 80 percent but less than 100 percent impaired, limited or restricted      Problem List Patient Active Problem List   Diagnosis Date Noted  . Yeast vaginitis 12/14/2015  . Diverticulitis of colon without hemorrhage 12/04/2015  . Rotator cuff syndrome of left shoulder 10/26/2015  . Adhesive capsulitis of right shoulder 10/26/2015  . Cervical disc disorder with radiculopathy of cervical region 10/26/2015  .  Excessive sweating, local 10/03/2015  . Heart murmur, systolic 06/08/2535  . Rosacea 09/07/2015  . Skin lesion 03/01/2015  . Adenomatous polyp of colon 04/12/2014  . Hearing loss in left ear 04/13/2013  . Obesity 09/12/2012  . NSTEMI (non-ST elevated myocardial infarction) ([place]) 03/25/2012  . Osteopenia 11/13/2011  .  ADD (attention deficit disorder) 02/22/2011  . Pure hypercholesterolemia 10/09/2006  . Bipolar I disorder ([place]) 10/09/2006  . NEUROPATHY, PERIPHERAL 10/09/2006  . HYPERTENSION, BENIGN SYSTEMIC 10/09/2006  . OSTEOARTHRITIS, MULTI SITES 10/09/2006  . CONVULSIONS, SEIZURES, NOS 10/09/2006   PHYSICAL THERAPY DISCHARGE SUMMARY  Visits from Start of Care: 2  Current functional level related to goals / functional outcomes: See above   Remaining deficits: See above   Education / Equipment: Anatomy of condition, exercise form/rationale, HEP  Plan: Patient agrees to discharge.  Patient goals were not met. Patient is being discharged due to the patient's request.  ?????        Franceen Erisman C. Zyanna Leisinger PT, DPT 02/20/2016 12:14 PM   [facility] [address] [place], [place], [address] Phone: [phone]   Fax:  [phone]  Name: [name] MRN: [id number] Date of Birth: 11-21-1930

## 2016-02-20 NOTE — Patient Instructions (Signed)
Access Code: [email or web address]  URL: [email or web address]  Date: 02/20/2016  Prepared by: Selinda Eon   Exercises  Isometric Shoulder Flexion at [place] - 3 reps - 1 sets - 10 hold - 2x daily - 5x weekly  Isometric Shoulder Extension at [place] - 3 reps - 1 sets - 10 hold - 2x daily - 5x weekly  Isometric Shoulder External Rotation at Wall - 3 reps - 1 sets - 10 hold - 2x daily - 5x weekly  Standing Isometric Shoulder Internal Rotation at Doorway - 3 reps - 1 sets - 10 hold - 2x daily - 5x weekly  Isometric Shoulder Abduction at Wall - 3 reps - 1 sets - 10 hold - 2x daily - 5x weekly  Isometric Shoulder Adduction - 3 reps - 1 sets - 10 hold - 2x daily - 5x weekly  Seated Elbow Flexion with Self-Anchored Resistance - 10 reps - 3 sets - 2 hold - 1x daily - 5x weekly

## 2016-02-23 ENCOUNTER — Encounter: Payer: Self-pay | Admitting: Physical Therapy

## 2016-02-27 ENCOUNTER — Telehealth: Payer: Self-pay | Admitting: *Deleted

## 2016-02-27 ENCOUNTER — Encounter: Payer: Self-pay | Admitting: Physical Therapy

## 2016-02-27 DIAGNOSIS — E7800 Pure hypercholesterolemia, unspecified: Secondary | ICD-10-CM

## 2016-02-27 NOTE — Telephone Encounter (Signed)
Pt calling in wanting to talk to dr hensel about her shoulder replacement. Please call pt back. Deseree Kennon Holter, CMA

## 2016-02-27 NOTE — Telephone Encounter (Signed)
Pt calling wanting to talk to talk to dr Andria Frames about her shoulder replacement. Please call pt back [name], CMA

## 2016-02-27 NOTE — Telephone Encounter (Signed)
Mentioned that she cutting back on meds.  Less ibuprofen, less prozac and no aspirin  She does plan shoulder surgery on Aug 9.  Wondered if she needed a cholesterol check.  She is due and I entered the future order.

## 2016-02-29 ENCOUNTER — Encounter: Payer: Self-pay | Admitting: Physical Therapy

## 2016-03-05 ENCOUNTER — Ambulatory Visit: Payer: Self-pay | Admitting: Endocrinology

## 2016-03-05 ENCOUNTER — Encounter: Payer: Self-pay | Admitting: Physical Therapy

## 2016-03-07 ENCOUNTER — Ambulatory Visit (INDEPENDENT_AMBULATORY_CARE_PROVIDER_SITE_OTHER): Payer: Medicare Other | Admitting: Endocrinology

## 2016-03-07 ENCOUNTER — Encounter: Payer: Self-pay | Admitting: Physical Therapy

## 2016-03-07 ENCOUNTER — Encounter: Payer: Self-pay | Admitting: Endocrinology

## 2016-03-07 VITALS — BP 140/78 | HR 61 | Wt 215.0 lb

## 2016-03-07 DIAGNOSIS — R61 Generalized hyperhidrosis: Secondary | ICD-10-CM

## 2016-03-07 MED ORDER — PROPANTHELINE BROMIDE 15 MG PO TABS: 15.0000 mg | ORAL_TABLET | Freq: Three times a day (TID) | ORAL | 1 refills | Status: DC

## 2016-03-07 NOTE — Progress Notes (Signed)
Patient ID: Markisha Ostenson, female   DOB: 02/08/1931, 82 y.o.   MRN: [id number]             Chief complaint:  Excessive sweating  History of Present Illness:   She was seen in consultation in 3/17 for problems with  increased sweating She gave the following history: Sweating occurs mostly when she is doing any physical activity, even when moving around her home or getting dressed.  She starts getting significant amount of sweating starting from her head and face and also upper trunk.    Whenever she goes shopping she starts profuse sweating.   At night she may occasionally wake up with her clothes wet if she is getting up to go to the bathroom.    She may occasionally have flushing of her face but this does not always occur at the time of sweating.  At times will feel warm in her neck and face area.  She has been evaluated with thyroid and pituitary function tests and these were normal.  Since she was felt to have idiopathic sweating she was given a trial of Catapres patch 0.1 mg weekly instead of her HCTZ for hypertension She felt that on her initial trial of this that her sweating was decreased considerably However now she says that the symptoms are back to baseline and she is not able to do much activity without getting very sweaty especially in her head and face  She thinks her blood pressure is generally well controlled but only occasionally high, recently, 118/72    Past Medical History:  Diagnosis Date  . AVM (arteriovenous malformation)   . Bipolar disorder ([place])   . Depression    sucide attempt in the distant past  . GI bleed   . Hiatal hernia    s/p repair  . HLD (hyperlipidemia)   . HTN (hypertension)   . Low back pain    ESI by Dr. Nelva Bush 2 weeks ago  . OA (osteoarthritis)    bilateral wrist, bilateral knees  . Obesity   . Obesity   . Ovarian cyst   . Right wrist injury    multiple surgeries and residual weakness.   . Rotator cuff arthropathy    right  .  Seizures ([place]) 1980s   2/2 meningioma. none since cranioplasty    Past Surgical History:  Procedure Laterality Date  . BREAST REDUCTION SURGERY    . CATARACT EXTRACTION Bilateral   . CRANIOPLASTY     2/2 meningioma 1980s  . HIATAL HERNIA REPAIR    . KNEE ARTHROPLASTY     bilat  . LEFT HEART CATHETERIZATION WITH CORONARY ANGIOGRAM N/A 03/26/2012   Procedure: LEFT HEART CATHETERIZATION WITH CORONARY ANGIOGRAM;  Surgeon: Minus Breeding, MD;  Location: [facility] CATH LAB;  Service: Cardiovascular;  Laterality: N/A;    Family History  Problem Relation Age of Onset  . Alzheimer's disease Mother   . Heart disease Father     MI 31yo  . Heart disease Brother     Social History:  reports that she has never smoked. She has never used smokeless tobacco. She reports that she does not drink alcohol or use drugs.  Allergies:  Allergies  Allergen Reactions  . Amoxicillin Anaphylaxis    REACTION: unspecified  . Penicillins Anaphylaxis  . Morphine Sulfate Other (See Comments)    unknown  . Phenobarbital Other (See Comments)    unknown  . Phenytoin Other (See Comments)    unknown      Medication  List       Accurate as of 03/07/16 10:14 AM. Always use your most recent med list.          acyclovir ointment 5 % Commonly known as:  ZOVIRAX APPLY TOPICALLY EVERY 3 HOURS   amphetamine-dextroamphetamine 10 MG tablet Commonly known as:  ADDERALL Take 1 tablet (10 mg total) by mouth daily.   aspirin 81 MG tablet Take 1 tablet (81 mg total) by mouth daily.   atorvastatin 10 MG tablet Commonly known as:  LIPITOR TAKE 1 TABLET BY MOUTH EVERY NIGHT AT BEDTIME   cloNIDine 0.1 mg/24hr patch Commonly known as:  CATAPRES - Dosed in mg/24 hr APPLY 1 PATCH(0.1 MG) EXTERNALLY TO THE SKIN 1 TIME A WEEK   fluconazole 100 MG tablet Commonly known as:  DIFLUCAN Take 1 tablet (100 mg total) by mouth daily.   FLUoxetine 40 MG capsule Commonly known as:  PROZAC Take 1 capsule (40 mg total) by  mouth daily.   gabapentin 300 MG capsule Commonly known as:  NEURONTIN Take 1 capsule (300 mg total) by mouth 3 (three) times daily.   ibuprofen 200 MG tablet Commonly known as:  ADVIL,MOTRIN Take 200 mg by mouth every 6 (six) hours as needed.   metoprolol succinate 25 MG 24 hr tablet Commonly known as:  TOPROL-XL Take 1 tablet (25 mg total) by mouth daily.   metroNIDAZOLE 0.75 % gel Commonly known as:  METROGEL Apply 1 application topically 2 (two) times daily.       Review of Systems  History of ADHD.  She is asking about whether she has excessive female hormone because of her personality of being more aggressive  PHYSICAL EXAM:  BP 118/80 (BP Location: Right Arm, Patient Position: Sitting)   Pulse 61   Wt 215 lb (97.5 kg)   SpO2 96%   BMI 35.78 kg/m     ASSESSMENT/PLAN:  Idiopathic sweating episodes with history of hypertension She previously had significant improvement in her symptoms with using her clonidine 0.1 mg patch Now she thinks that her symptoms are back to baseline She wants to try a medication to control her symptoms Since she has not had a hysterectomy she is not a candidate for HRT  Will give her propantheline 7.5-15 mg up to 3 times a day Discussed side effects of dry mouth, constipation   Kevon Tench 03/07/2016, 10:14 AM

## 2016-03-08 ENCOUNTER — Encounter: Payer: Self-pay | Admitting: Endocrinology

## 2016-03-12 ENCOUNTER — Encounter: Payer: Self-pay | Admitting: Physical Therapy

## 2016-03-13 ENCOUNTER — Telehealth: Payer: Self-pay | Admitting: Endocrinology

## 2016-03-13 ENCOUNTER — Other Ambulatory Visit: Payer: Self-pay | Admitting: *Deleted

## 2016-03-13 DIAGNOSIS — F988 Other specified behavioral and emotional disorders with onset usually occurring in childhood and adolescence: Secondary | ICD-10-CM

## 2016-03-13 NOTE — Telephone Encounter (Signed)
See note below and please advise. Thanks! 

## 2016-03-13 NOTE — Telephone Encounter (Signed)
I have no other options for treatment.  Do not think she has a known cause of sweating, she can follow-up with PCP

## 2016-03-13 NOTE — Telephone Encounter (Signed)
PT stated that the new medication is not covered by her insurance, she said it is very expensive and would like to know what the next step is for this.  She also stated that she has "researched" her symptoms thoroughly and would like Dr. Dwyane Dee to consider [name]?    She requests call back.

## 2016-03-14 ENCOUNTER — Encounter: Payer: Self-pay | Admitting: Physical Therapy

## 2016-03-14 MED ORDER — AMPHETAMINE-DEXTROAMPHETAMINE 10 MG PO TABS: 10.0000 mg | ORAL_TABLET | Freq: Every day | ORAL | 0 refills | Status: DC

## 2016-03-14 NOTE — Telephone Encounter (Signed)
Called patient and spoke with her about her issues, advised patient to see her PCP, patient was not happy with results and decision, patient got aggravated with me and hung up the phone after discussed the symptoms, I tried to explain to patient that Dr.Kumar did not feel that was the issue to follow up with PCP but she got very irritated.

## 2016-03-14 NOTE — Telephone Encounter (Signed)
Patient informed that Rx for Adderall is ready for pick up.  Derl Barrow, RN

## 2016-03-14 NOTE — Telephone Encounter (Signed)
Pt stated she was supposed to have a Rx of amphotamine-dextroamphetamine waiting upfront for her to pick up today, but nothing was in the box. Please call pt when ready to pick up. Thanks! ep

## 2016-03-14 NOTE — Telephone Encounter (Signed)
Attempted to reach the pt. Vm was not activated. Will try again at a later time.

## 2016-03-19 NOTE — Pre-Procedure Instructions (Signed)
[name]  03/19/2016      [facility] [place], [place] - [address] [place] [address] Phone: [phone] Fax: [place], [place] [address] Phone: [phone] Fax: [phone]  [facility] [place], [place] [address] Phone: [phone] Fax: [phone]    Your procedure is scheduled on Friday, August 18.  Report to [facility] Admitting at 5:30 A.M.  Call this number if you have problems the morning of surgery:  [phone]   Remember:  Do not eat food or drink liquids after midnight.  Take these medicines the morning of surgery with A SIP OF WATER: clonidine (catapres), fluoxetine (prozac), gabapentin (neurontin), metoprolol (Troprol-XL)  7 days prior to surgery STOP taking any Aspirin, Aleve, Naproxen, Ibuprofen, Motrin, Advil, Goody's, BC's, all herbal medications, fish oil, and all vitamins    Do not wear jewelry, make-up or nail polish.  Do not wear lotions, powders, or perfumes.  You may wear deoderant.  Do not shave 48 hours prior to surgery.  Men may shave face and neck.  Do not bring valuables to the hospital.  [facility] is not responsible for any belongings or valuables.  Contacts, dentures or bridgework may not be worn into surgery.  Leave your suitcase in the car.  After surgery it may be brought to your room.  For patients admitted to the hospital, discharge time will be determined by your treatment team.  Patients discharged the day of surgery will not be allowed to drive home.    Special instructions:    [facility]- Preparing For Surgery  Before surgery, you can play an important role. Because skin is not sterile, your skin needs to be as free of germs as possible. You can reduce the number  of germs on your skin by washing with CHG (chlorahexidine gluconate) Soap before surgery.  CHG is an antiseptic cleaner which kills germs and bonds with the skin to continue killing germs even after washing.  Please do not use if you have an allergy to CHG or antibacterial soaps. If your skin becomes reddened/irritated stop using the CHG.  Do not shave (including legs and underarms) for at least 48 hours prior to first CHG shower. It is OK to shave your face.  Please follow these instructions carefully.   1. Shower the NIGHT BEFORE SURGERY and the MORNING OF SURGERY with CHG.   2. If you chose to wash your hair, wash your hair first as usual with your normal shampoo.  3. After you shampoo, rinse your hair and body thoroughly to remove the shampoo.  4. Use CHG as you would any other liquid soap. You can apply CHG directly to the skin and wash gently with a scrungie or a clean washcloth.   5. Apply the CHG Soap to your body ONLY FROM THE NECK DOWN.  Do not use on open wounds or open sores. Avoid contact with your eyes, ears, mouth and genitals (private parts). Wash genitals (private parts) with your normal soap.  6. Wash thoroughly, paying special attention to the area where your surgery will be performed.  7. Thoroughly rinse your body with warm water from the neck down.  8. DO NOT shower/wash with your normal soap after using and rinsing off the  CHG Soap.  9. Pat yourself dry with a CLEAN TOWEL.   10. Wear CLEAN PAJAMAS   11. Place CLEAN SHEETS on your bed the night of your first shower and DO NOT SLEEP WITH PETS.    Day of Surgery: Do not apply any deodorants/lotions. Please wear clean clothes to the hospital/surgery center.      Please read over the following fact sheets that you were given. Pain Booklet, Coughing and Deep Breathing, Blood Transfusion Information, MRSA Information and Surgical Site Infection Prevention

## 2016-03-20 ENCOUNTER — Encounter (HOSPITAL_COMMUNITY): Payer: Self-pay

## 2016-03-20 ENCOUNTER — Encounter (HOSPITAL_COMMUNITY)
Admission: RE | Admit: 2016-03-20 | Discharge: 2016-03-20 | Disposition: A | Discharge status: Home / Self Care | Payer: Medicare Other | Source: Ambulatory Visit | Attending: Orthopedic Surgery | Admitting: Orthopedic Surgery

## 2016-03-20 DIAGNOSIS — Z885 Allergy status to narcotic agent status: Secondary | ICD-10-CM | POA: Insufficient documentation

## 2016-03-20 DIAGNOSIS — E669 Obesity, unspecified: Secondary | ICD-10-CM | POA: Insufficient documentation

## 2016-03-20 DIAGNOSIS — Z6837 Body mass index (BMI) 37.0-37.9, adult: Secondary | ICD-10-CM | POA: Diagnosis not present

## 2016-03-20 DIAGNOSIS — Z7982 Long term (current) use of aspirin: Secondary | ICD-10-CM | POA: Insufficient documentation

## 2016-03-20 DIAGNOSIS — Z888 Allergy status to other drugs, medicaments and biological substances status: Secondary | ICD-10-CM | POA: Diagnosis not present

## 2016-03-20 DIAGNOSIS — I10 Essential (primary) hypertension: Secondary | ICD-10-CM | POA: Diagnosis not present

## 2016-03-20 DIAGNOSIS — F319 Bipolar disorder, unspecified: Secondary | ICD-10-CM | POA: Diagnosis not present

## 2016-03-20 DIAGNOSIS — Z01812 Encounter for preprocedural laboratory examination: Secondary | ICD-10-CM | POA: Insufficient documentation

## 2016-03-20 DIAGNOSIS — Z0183 Encounter for blood typing: Secondary | ICD-10-CM | POA: Insufficient documentation

## 2016-03-20 DIAGNOSIS — E785 Hyperlipidemia, unspecified: Secondary | ICD-10-CM | POA: Insufficient documentation

## 2016-03-20 DIAGNOSIS — M7581 Other shoulder lesions, right shoulder: Secondary | ICD-10-CM | POA: Insufficient documentation

## 2016-03-20 DIAGNOSIS — Z880 Allergy status to penicillin: Secondary | ICD-10-CM | POA: Insufficient documentation

## 2016-03-20 DIAGNOSIS — Z79899 Other long term (current) drug therapy: Secondary | ICD-10-CM | POA: Insufficient documentation

## 2016-03-20 HISTORY — DX: Malignant (primary) neoplasm, unspecified: C80.1

## 2016-03-20 HISTORY — DX: Respiratory tuberculosis unspecified: A15.9

## 2016-03-20 HISTORY — DX: Generalized hyperhidrosis: R61

## 2016-03-20 HISTORY — DX: Unspecified asthma, uncomplicated: J45.909

## 2016-03-20 HISTORY — DX: Essential (primary) hypertension: I10

## 2016-03-20 HISTORY — DX: Anemia, unspecified: D64.9

## 2016-03-20 LAB — SURGICAL PCR SCREEN
MRSA, PCR: NEGATIVE
STAPHYLOCOCCUS AUREUS: NEGATIVE

## 2016-03-20 LAB — CBC
HEMATOCRIT: 43.7 % (ref 36.0–46.0)
Hemoglobin: 14.1 g/dL (ref 12.0–15.0)
MCH: 29.2 pg (ref 26.0–34.0)
MCHC: 32.3 g/dL (ref 30.0–36.0)
MCV: 90.5 fL (ref 78.0–100.0)
PLATELETS: 231 10*3/uL (ref 150–400)
RBC: 4.83 MIL/uL (ref 3.87–5.11)
RDW: 14 % (ref 11.5–15.5)
WBC: 8.5 10*3/uL (ref 4.0–10.5)

## 2016-03-20 LAB — BASIC METABOLIC PANEL
Anion gap: 8 (ref 5–15)
BUN: 14 mg/dL (ref 6–20)
CALCIUM: 9.6 mg/dL (ref 8.9–10.3)
CO2: 25 mmol/L (ref 22–32)
CREATININE: 0.65 mg/dL (ref 0.44–1.00)
Chloride: 104 mmol/L (ref 101–111)
Glucose, Bld: 109 mg/dL — ABNORMAL HIGH (ref 65–99)
Potassium: 4 mmol/L (ref 3.5–5.1)
SODIUM: 137 mmol/L (ref 135–145)

## 2016-03-20 LAB — TYPE AND SCREEN
ABO/RH(D): O POS
ANTIBODY SCREEN: NEGATIVE

## 2016-03-20 LAB — ABO/RH: ABO/RH(D): O POS

## 2016-03-20 NOTE — H&P (Signed)
[name] is an 82 y.o. female.    Chief Complaint: right shoulder pain and weakness  HPI: Pt is a 82 y.o. female complaining of right shoulder pain for multiple years. Pain had continually increased since the beginning. X-rays in the clinic show end-stage arthritic changes of the right shoulder. Pt has tried various conservative treatments which have failed to alleviate their symptoms, including injections and therapy. Various options are discussed with the patient. Risks, benefits and expectations were discussed with the patient. Patient understand the risks, benefits and expectations and wishes to proceed with surgery.   PCP:  Zigmund Gottron, MD  D/C Plans: Home  PMH: Past Medical History:  Diagnosis Date  . AVM (arteriovenous malformation)   . Bipolar disorder ([place])   . Depression    sucide attempt in the distant past  . GI bleed   . Hiatal hernia    s/p repair  . HLD (hyperlipidemia)   . HTN (hypertension)   . Low back pain    ESI by Dr. Nelva Bush 2 weeks ago  . OA (osteoarthritis)    bilateral wrist, bilateral knees  . Obesity   . Obesity   . Ovarian cyst   . Right wrist injury    multiple surgeries and residual weakness.   . Rotator cuff arthropathy    right  . Seizures ([place]) 1980s   2/2 meningioma. none since cranioplasty    PSH: Past Surgical History:  Procedure Laterality Date  . BREAST REDUCTION SURGERY    . CATARACT EXTRACTION Bilateral   . CRANIOPLASTY     2/2 meningioma 1980s  . HIATAL HERNIA REPAIR    . KNEE ARTHROPLASTY     bilat  . LEFT HEART CATHETERIZATION WITH CORONARY ANGIOGRAM N/A 03/26/2012   Procedure: LEFT HEART CATHETERIZATION WITH CORONARY ANGIOGRAM;  Surgeon: Minus Breeding, MD;  Location: [facility] CATH LAB;  Service: Cardiovascular;  Laterality: N/A;    Social History:  reports that she has never smoked. She has never used smokeless tobacco. She reports that she does not drink alcohol or use drugs.  Allergies:  Allergies  Allergen  Reactions  . Amoxicillin Anaphylaxis    REACTION: unspecified  . Penicillins Anaphylaxis  . Morphine Sulfate Other (See Comments)    unknown  . Phenobarbital Other (See Comments)    unknown  . Phenytoin Other (See Comments)    Makes me feel funny     Medications: No current facility-administered medications for this encounter.    Current Outpatient Prescriptions  Medication Sig Dispense Refill  . amphetamine-dextroamphetamine (ADDERALL) 10 MG tablet Take 1 tablet (10 mg total) by mouth daily. 90 tablet 0  . atorvastatin (LIPITOR) 10 MG tablet TAKE 1 TABLET BY MOUTH EVERY NIGHT AT BEDTIME 90 tablet 3  . cloNIDine (CATAPRES - DOSED IN MG/24 HR) 0.1 mg/24hr patch APPLY 1 PATCH(0.1 MG) EXTERNALLY TO THE SKIN 1 TIME A WEEK 13 patch 1  . Coenzyme Q10 (COQ10) 200 MG CAPS Take 1 tablet by mouth daily.    Marland Kitchen FLUoxetine (PROZAC) 40 MG capsule Take 1 capsule (40 mg total) by mouth daily. 90 capsule 3  . gabapentin (NEURONTIN) 300 MG capsule Take 1 capsule (300 mg total) by mouth 3 (three) times daily. 90 capsule 3  . ibuprofen (ADVIL,MOTRIN) 200 MG tablet Take 200 mg by mouth daily.     . metoprolol succinate (TOPROL-XL) 25 MG 24 hr tablet Take 1 tablet (25 mg total) by mouth daily. 90 tablet 3  . metroNIDAZOLE (METROGEL) 0.75 % gel Apply 1  application topically daily as needed. Acne rosacea    . propantheline (PROBANTHINE) 15 MG tablet Take 1 tablet (15 mg total) by mouth 3 (three) times daily with meals. Start with 1/2 tab 90 tablet 1  . acyclovir ointment (ZOVIRAX) 5 % APPLY TOPICALLY EVERY 3 HOURS (Patient not taking: Reported on 03/14/2016) 15 g 3  . aspirin 81 MG tablet Take 1 tablet (81 mg total) by mouth daily. (Patient not taking: Reported on 03/07/2016) 30 tablet   . fluconazole (DIFLUCAN) 100 MG tablet Take 1 tablet (100 mg total) by mouth daily. (Patient not taking: Reported on 03/07/2016) 5 tablet 0    No results found for this or any previous visit (from the past 48 hour(s)). No  results found.  ROS: Pain with rom of the right upper extremity  Physical Exam:  Alert and oriented 82 y.o. female in no acute distress Cranial nerves 2-12 intact Cervical spine: full rom with no tenderness, nv intact distally Chest: active breath sounds bilaterally, no wheeze rhonchi or rales Heart: regular rate and rhythm, no murmur Abd: non tender non distended with active bowel sounds Hip is stable with rom  Right shoulder with limited rom and strength nv intact distally No rashes or edema  Assessment/Plan Assessment: right shoulder rotator cuff arthropathy  Plan: Patient will undergo a right reverse total shoulder by Dr. Veverly Fells at [facility]. Risks benefits and expectations were discussed with the patient. Patient understand risks, benefits and expectations and wishes to proceed.

## 2016-03-25 ENCOUNTER — Telehealth: Payer: Self-pay | Admitting: Family Medicine

## 2016-03-25 NOTE — Telephone Encounter (Signed)
Called and given results

## 2016-03-25 NOTE — Telephone Encounter (Signed)
Pt would like her lab results today if possible, she is concerned because she is having surgery on Friday. Please advise. Thanks! ep

## 2016-03-28 NOTE — Anesthesia Preprocedure Evaluation (Addendum)
Anesthesia Evaluation  Patient identified by MRN, date of birth, ID band Patient awake    Reviewed: Allergy & Precautions, NPO status , Patient's Chart, lab work & pertinent test results, reviewed documented beta blocker date and time   Airway Mallampati: II  TM Distance: >3 FB     Dental  (+) Dental Advisory Given, Teeth Intact   Pulmonary asthma (seasonal) , neg sleep apnea, neg COPD, neg recent URI,    Pulmonary exam normal breath sounds clear to auscultation       Cardiovascular hypertension, Pt. on medications and Pt. on home beta blockers (-) angina+ Past MI (NSTEMI)  (-) Cardiac Stents, (-) CABG, (-) Orthopnea and (-) PND  Rhythm:Regular Rate:Normal  TTE 09/27/2015: Study Conclusions  - Left ventricle: The cavity size was normal. Wall thickness was   increased in a pattern of mild LVH. Systolic function was normal.   The estimated ejection fraction was in the range of 60% to 65%.   Wall motion was normal; there were no regional wall motion   abnormalities. Doppler parameters are consistent with abnormal   left ventricular relaxation (grade 1 diastolic dysfunction). - Right atrium: The atrium was mildly dilated.   Neuro/Psych Seizures - (in the 1980s secondary to meningioma; none since cranioplasty),  PSYCHIATRIC DISORDERS Depression Bipolar Disorder Left-sided weakness, left-sided numbness from toes to just above the knee  Neuromuscular disease (peripheral neuropathy)    GI/Hepatic Neg liver ROS, hiatal hernia (s/p repair), H/o GI bleed   Endo/Other  negative endocrine ROSneg diabetes  Renal/GU negative Renal ROS     Musculoskeletal  (+) Arthritis , Osteoarthritis,  Low back pain   Abdominal (+) + obese,   Peds  Hematology  (+) Blood dyscrasia, anemia ,   Anesthesia Other Findings HLD  Reproductive/Obstetrics                            Anesthesia Physical Anesthesia Plan  ASA:  III  Anesthesia Plan: Regional and General   Post-op Pain Management: GA combined w/ Regional for post-op pain   Induction: Intravenous  Airway Management Planned: Oral ETT  Additional Equipment:   Intra-op Plan:   Post-operative Plan: Extubation in OR  Informed Consent: I have reviewed the patients History and Physical, chart, labs and discussed the procedure including the risks, benefits and alternatives for the proposed anesthesia with the patient or authorized representative who has indicated his/her understanding and acceptance.   Dental advisory given  Plan Discussed with:   Anesthesia Plan Comments:        Anesthesia Quick Evaluation

## 2016-03-29 ENCOUNTER — Encounter (HOSPITAL_COMMUNITY): Payer: Self-pay | Admitting: *Deleted

## 2016-03-29 ENCOUNTER — Encounter (HOSPITAL_COMMUNITY)
Admission: RE | Disposition: A | Discharge status: Home Health | Payer: Self-pay | Source: Ambulatory Visit | Attending: Orthopedic Surgery

## 2016-03-29 ENCOUNTER — Inpatient Hospital Stay (HOSPITAL_COMMUNITY): Payer: Medicare Other

## 2016-03-29 ENCOUNTER — Inpatient Hospital Stay (HOSPITAL_COMMUNITY)
Admission: RE | Admit: 2016-03-29 | Discharge: 2016-03-31 | DRG: 483 | Disposition: A | Discharge status: Home Health | Payer: Medicare Other | Source: Ambulatory Visit | Attending: Orthopedic Surgery | Admitting: Orthopedic Surgery

## 2016-03-29 ENCOUNTER — Inpatient Hospital Stay (HOSPITAL_COMMUNITY): Payer: Medicare Other | Admitting: Anesthesiology

## 2016-03-29 DIAGNOSIS — M12811 Other specific arthropathies, not elsewhere classified, right shoulder: Secondary | ICD-10-CM | POA: Diagnosis not present

## 2016-03-29 DIAGNOSIS — M19011 Primary osteoarthritis, right shoulder: Principal | ICD-10-CM | POA: Diagnosis present

## 2016-03-29 DIAGNOSIS — I252 Old myocardial infarction: Secondary | ICD-10-CM

## 2016-03-29 DIAGNOSIS — E785 Hyperlipidemia, unspecified: Secondary | ICD-10-CM | POA: Diagnosis present

## 2016-03-29 DIAGNOSIS — Z880 Allergy status to penicillin: Secondary | ICD-10-CM

## 2016-03-29 DIAGNOSIS — Z881 Allergy status to other antibiotic agents status: Secondary | ICD-10-CM

## 2016-03-29 DIAGNOSIS — Z471 Aftercare following joint replacement surgery: Secondary | ICD-10-CM | POA: Diagnosis not present

## 2016-03-29 DIAGNOSIS — M25511 Pain in right shoulder: Secondary | ICD-10-CM | POA: Diagnosis not present

## 2016-03-29 DIAGNOSIS — Z96619 Presence of unspecified artificial shoulder joint: Secondary | ICD-10-CM

## 2016-03-29 DIAGNOSIS — F319 Bipolar disorder, unspecified: Secondary | ICD-10-CM | POA: Diagnosis present

## 2016-03-29 DIAGNOSIS — Z7982 Long term (current) use of aspirin: Secondary | ICD-10-CM

## 2016-03-29 DIAGNOSIS — Z888 Allergy status to other drugs, medicaments and biological substances status: Secondary | ICD-10-CM | POA: Diagnosis not present

## 2016-03-29 DIAGNOSIS — I10 Essential (primary) hypertension: Secondary | ICD-10-CM | POA: Diagnosis present

## 2016-03-29 DIAGNOSIS — Z885 Allergy status to narcotic agent status: Secondary | ICD-10-CM

## 2016-03-29 DIAGNOSIS — Z96611 Presence of right artificial shoulder joint: Secondary | ICD-10-CM | POA: Diagnosis not present

## 2016-03-29 DIAGNOSIS — Z79899 Other long term (current) drug therapy: Secondary | ICD-10-CM

## 2016-03-29 DIAGNOSIS — G8918 Other acute postprocedural pain: Secondary | ICD-10-CM | POA: Diagnosis not present

## 2016-03-29 DIAGNOSIS — M75101 Unspecified rotator cuff tear or rupture of right shoulder, not specified as traumatic: Secondary | ICD-10-CM | POA: Diagnosis not present

## 2016-03-29 HISTORY — PX: REVERSE SHOULDER ARTHROPLASTY: SHX5054

## 2016-03-29 SURGERY — ARTHROPLASTY, SHOULDER, TOTAL, REVERSE
Anesthesia: Regional | Site: Shoulder | Laterality: Right

## 2016-03-29 MED ORDER — ACETAMINOPHEN 650 MG RE SUPP: 650.0000 mg | Freq: Four times a day (QID) | RECTAL | Status: DC | PRN

## 2016-03-29 MED ORDER — METOPROLOL SUCCINATE ER 25 MG PO TB24
25.0000 mg | ORAL_TABLET | Freq: Every day | ORAL | Status: DC
  Administered 2016-03-31: 25 mg via ORAL
  Filled 2016-03-29 (×2): qty 1

## 2016-03-29 MED ORDER — PHENOL 1.4 % MT LIQD: 1.0000 | OROMUCOSAL | Status: DC | PRN

## 2016-03-29 MED ORDER — CHLORHEXIDINE GLUCONATE 4 % EX LIQD: 60.0000 mL | Freq: Once | CUTANEOUS | Status: DC

## 2016-03-29 MED ORDER — PROPANTHELINE BROMIDE 15 MG PO TABS
7.5000 mg | ORAL_TABLET | Freq: Three times a day (TID) | ORAL | Status: DC
  Administered 2016-03-29: 7.5 mg via ORAL
  Filled 2016-03-29 (×6): qty 1

## 2016-03-29 MED ORDER — PROPOFOL 10 MG/ML IV BOLUS
INTRAVENOUS | Status: DC | PRN
  Administered 2016-03-29: 100 mg via INTRAVENOUS
  Administered 2016-03-29: 40 mg via INTRAVENOUS

## 2016-03-29 MED ORDER — COQ10 200 MG PO CAPS: 1.0000 | ORAL_CAPSULE | Freq: Every day | ORAL | Status: DC

## 2016-03-29 MED ORDER — ONDANSETRON HCL 4 MG/2ML IJ SOLN: 4.0000 mg | Freq: Once | INTRAMUSCULAR | Status: DC | PRN

## 2016-03-29 MED ORDER — ASPIRIN 81 MG PO TABS: 81.0000 mg | ORAL_TABLET | Freq: Every day | ORAL | Status: DC

## 2016-03-29 MED ORDER — BUPIVACAINE-EPINEPHRINE 0.25% -1:200000 IJ SOLN
INTRAMUSCULAR | Status: DC | PRN
  Administered 2016-03-29: 8 mL

## 2016-03-29 MED ORDER — PROPOFOL 10 MG/ML IV BOLUS
INTRAVENOUS | Status: AC
  Filled 2016-03-29: qty 20

## 2016-03-29 MED ORDER — LACTATED RINGERS IV SOLN
INTRAVENOUS | Status: DC | PRN
  Administered 2016-03-29 (×2): via INTRAVENOUS

## 2016-03-29 MED ORDER — DOCUSATE SODIUM 100 MG PO CAPS
100.0000 mg | ORAL_CAPSULE | Freq: Two times a day (BID) | ORAL | Status: DC
  Administered 2016-03-30 – 2016-03-31 (×3): 100 mg via ORAL
  Filled 2016-03-29 (×3): qty 1

## 2016-03-29 MED ORDER — PHENYLEPHRINE HCL 10 MG/ML IJ SOLN
INTRAMUSCULAR | Status: DC | PRN
  Administered 2016-03-29 (×3): 80 ug via INTRAVENOUS
  Administered 2016-03-29: 120 ug via INTRAVENOUS

## 2016-03-29 MED ORDER — ACETAMINOPHEN 325 MG PO TABS: 650.0000 mg | ORAL_TABLET | Freq: Four times a day (QID) | ORAL | Status: DC | PRN

## 2016-03-29 MED ORDER — FENTANYL CITRATE (PF) 100 MCG/2ML IJ SOLN: 25.0000 ug | INTRAMUSCULAR | Status: DC | PRN

## 2016-03-29 MED ORDER — BUPIVACAINE-EPINEPHRINE (PF) 0.5% -1:200000 IJ SOLN
INTRAMUSCULAR | Status: DC | PRN
  Administered 2016-03-29: 30 mL via PERINEURAL

## 2016-03-29 MED ORDER — PHENYLEPHRINE 40 MCG/ML (10ML) SYRINGE FOR IV PUSH (FOR BLOOD PRESSURE SUPPORT)
PREFILLED_SYRINGE | INTRAVENOUS | Status: AC
  Filled 2016-03-29: qty 10

## 2016-03-29 MED ORDER — SODIUM CHLORIDE 0.9 % IV SOLN
INTRAVENOUS | Status: DC
  Administered 2016-03-29: 11:00:00 via INTRAVENOUS

## 2016-03-29 MED ORDER — EPHEDRINE SULFATE 50 MG/ML IJ SOLN
INTRAMUSCULAR | Status: AC
  Filled 2016-03-29: qty 1

## 2016-03-29 MED ORDER — METRONIDAZOLE 0.75 % EX GEL
1.0000 "application " | Freq: Every day | CUTANEOUS | Status: DC | PRN
  Administered 2016-03-29: 1 via TOPICAL
  Filled 2016-03-29: qty 45

## 2016-03-29 MED ORDER — FENTANYL CITRATE (PF) 100 MCG/2ML IJ SOLN
INTRAMUSCULAR | Status: AC
  Filled 2016-03-29: qty 2

## 2016-03-29 MED ORDER — CLINDAMYCIN PHOSPHATE 900 MG/50ML IV SOLN
900.0000 mg | INTRAVENOUS | Status: AC
  Administered 2016-03-29: 900 mg via INTRAVENOUS

## 2016-03-29 MED ORDER — FENTANYL CITRATE (PF) 100 MCG/2ML IJ SOLN
INTRAMUSCULAR | Status: DC | PRN
  Administered 2016-03-29 (×2): 50 ug via INTRAVENOUS

## 2016-03-29 MED ORDER — SUCCINYLCHOLINE CHLORIDE 200 MG/10ML IV SOSY
PREFILLED_SYRINGE | INTRAVENOUS | Status: AC
  Filled 2016-03-29: qty 10

## 2016-03-29 MED ORDER — LIDOCAINE HCL (CARDIAC) 20 MG/ML IV SOLN
INTRAVENOUS | Status: DC | PRN
  Administered 2016-03-29: 100 mg via INTRAVENOUS

## 2016-03-29 MED ORDER — ONDANSETRON HCL 4 MG/2ML IJ SOLN
INTRAMUSCULAR | Status: DC | PRN
  Administered 2016-03-29: 4 mg via INTRAVENOUS

## 2016-03-29 MED ORDER — BUPIVACAINE-EPINEPHRINE (PF) 0.25% -1:200000 IJ SOLN
INTRAMUSCULAR | Status: AC
  Filled 2016-03-29: qty 30

## 2016-03-29 MED ORDER — EPHEDRINE SULFATE 50 MG/ML IJ SOLN
INTRAMUSCULAR | Status: DC | PRN
  Administered 2016-03-29: 5 mg via INTRAVENOUS
  Administered 2016-03-29: 10 mg via INTRAVENOUS
  Administered 2016-03-29: 15 mg via INTRAVENOUS

## 2016-03-29 MED ORDER — CLONIDINE HCL 0.1 MG/24HR TD PTWK
0.1000 mg | MEDICATED_PATCH | TRANSDERMAL | Status: DC
  Administered 2016-03-31: 0.1 mg via TRANSDERMAL
  Filled 2016-03-29: qty 1

## 2016-03-29 MED ORDER — HYDROCODONE-ACETAMINOPHEN 5-325 MG PO TABS
1.0000 | ORAL_TABLET | ORAL | Status: DC | PRN
  Administered 2016-03-30 (×2): 1 via ORAL
  Filled 2016-03-29 (×2): qty 1

## 2016-03-29 MED ORDER — TRAMADOL HCL 50 MG PO TABS
50.0000 mg | ORAL_TABLET | Freq: Four times a day (QID) | ORAL | Status: DC | PRN
Start: 2016-03-29 — End: 2016-03-31

## 2016-03-29 MED ORDER — AMPHETAMINE-DEXTROAMPHETAMINE 10 MG PO TABS
10.0000 mg | ORAL_TABLET | Freq: Every day | ORAL | Status: DC
  Administered 2016-03-30 – 2016-03-31 (×2): 10 mg via ORAL
  Filled 2016-03-29 (×2): qty 1

## 2016-03-29 MED ORDER — PHENYLEPHRINE HCL 10 MG/ML IJ SOLN
INTRAMUSCULAR | Status: AC
  Filled 2016-03-29: qty 1

## 2016-03-29 MED ORDER — 0.9 % SODIUM CHLORIDE (POUR BTL) OPTIME
TOPICAL | Status: DC | PRN
  Administered 2016-03-29: 1000 mL

## 2016-03-29 MED ORDER — CLINDAMYCIN PHOSPHATE 600 MG/50ML IV SOLN
600.0000 mg | Freq: Four times a day (QID) | INTRAVENOUS | Status: AC
  Administered 2016-03-29 – 2016-03-30 (×2): 600 mg via INTRAVENOUS
  Filled 2016-03-29 (×3): qty 50

## 2016-03-29 MED ORDER — METOCLOPRAMIDE HCL 5 MG/ML IJ SOLN: 5.0000 mg | Freq: Three times a day (TID) | INTRAMUSCULAR | Status: DC | PRN

## 2016-03-29 MED ORDER — ONDANSETRON HCL 4 MG PO TABS: 4.0000 mg | ORAL_TABLET | Freq: Four times a day (QID) | ORAL | Status: DC | PRN

## 2016-03-29 MED ORDER — MIDAZOLAM HCL 2 MG/2ML IJ SOLN
INTRAMUSCULAR | Status: AC
  Filled 2016-03-29: qty 2

## 2016-03-29 MED ORDER — BISACODYL 10 MG RE SUPP: 10.0000 mg | Freq: Every day | RECTAL | Status: DC | PRN

## 2016-03-29 MED ORDER — PHENYLEPHRINE HCL 10 MG/ML IJ SOLN
INTRAVENOUS | Status: DC | PRN
  Administered 2016-03-29: 30 ug/min via INTRAVENOUS

## 2016-03-29 MED ORDER — ONDANSETRON HCL 4 MG/2ML IJ SOLN
INTRAMUSCULAR | Status: AC
  Filled 2016-03-29: qty 2

## 2016-03-29 MED ORDER — METOCLOPRAMIDE HCL 5 MG PO TABS: 5.0000 mg | ORAL_TABLET | Freq: Three times a day (TID) | ORAL | Status: DC | PRN

## 2016-03-29 MED ORDER — HYDROCODONE-ACETAMINOPHEN 5-325 MG PO TABS: 1.0000 | ORAL_TABLET | Freq: Four times a day (QID) | ORAL | 0 refills | Status: DC | PRN

## 2016-03-29 MED ORDER — ATORVASTATIN CALCIUM 10 MG PO TABS
10.0000 mg | ORAL_TABLET | Freq: Every day | ORAL | Status: DC
  Administered 2016-03-30: 10 mg via ORAL
  Filled 2016-03-29: qty 1

## 2016-03-29 MED ORDER — GLYCOPYRROLATE 0.2 MG/ML IJ SOLN
INTRAMUSCULAR | Status: DC | PRN
Start: 2016-03-29 — End: 2016-03-29
  Administered 2016-03-29: .2 mg via INTRAVENOUS
  Administered 2016-03-29: .5 mg via INTRAVENOUS

## 2016-03-29 MED ORDER — MENTHOL 3 MG MT LOZG: 1.0000 | LOZENGE | OROMUCOSAL | Status: DC | PRN

## 2016-03-29 MED ORDER — ONDANSETRON HCL 4 MG/2ML IJ SOLN: 4.0000 mg | Freq: Four times a day (QID) | INTRAMUSCULAR | Status: DC | PRN

## 2016-03-29 MED ORDER — SUCCINYLCHOLINE CHLORIDE 20 MG/ML IJ SOLN
INTRAMUSCULAR | Status: DC | PRN
  Administered 2016-03-29: 100 mg via INTRAVENOUS

## 2016-03-29 MED ORDER — CLINDAMYCIN PHOSPHATE 900 MG/50ML IV SOLN
900.0000 mg | INTRAVENOUS | Status: DC
  Filled 2016-03-29: qty 50

## 2016-03-29 MED ORDER — ROCURONIUM BROMIDE 10 MG/ML (PF) SYRINGE
PREFILLED_SYRINGE | INTRAVENOUS | Status: AC
  Filled 2016-03-29: qty 10

## 2016-03-29 MED ORDER — GABAPENTIN 300 MG PO CAPS
300.0000 mg | ORAL_CAPSULE | Freq: Three times a day (TID) | ORAL | Status: DC
  Administered 2016-03-29 – 2016-03-31 (×5): 300 mg via ORAL
  Filled 2016-03-29 (×5): qty 1

## 2016-03-29 MED ORDER — FLUOXETINE HCL 20 MG PO CAPS
40.0000 mg | ORAL_CAPSULE | Freq: Every day | ORAL | Status: DC
  Administered 2016-03-30 – 2016-03-31 (×2): 40 mg via ORAL
  Filled 2016-03-29 (×3): qty 2

## 2016-03-29 MED ORDER — POLYETHYLENE GLYCOL 3350 17 G PO PACK: 17.0000 g | PACK | Freq: Every day | ORAL | Status: DC | PRN

## 2016-03-29 MED ORDER — HYDROMORPHONE HCL 1 MG/ML IJ SOLN: 0.2500 mg | INTRAMUSCULAR | Status: DC | PRN

## 2016-03-29 MED ORDER — LIDOCAINE 2% (20 MG/ML) 5 ML SYRINGE
INTRAMUSCULAR | Status: AC
  Filled 2016-03-29: qty 5

## 2016-03-29 SURGICAL SUPPLY — 70 items
BIT DRILL 170X2.5X (BIT) IMPLANT
BIT DRILL 5/64X5 DISP (BIT) ×3 IMPLANT
BIT DRL 170X2.5X (BIT) ×1
BLADE SAG 18X100X1.27 (BLADE) ×3 IMPLANT
CAPT SHLDR REVTOTAL 1 ×2 IMPLANT
CEMENT BONE DEPUY (Cement) ×2 IMPLANT
CLOSURE WOUND 1/2 X4 (GAUZE/BANDAGES/DRESSINGS) ×1
COVER SURGICAL LIGHT HANDLE (MISCELLANEOUS) ×3 IMPLANT
DRAPE IMP U-DRAPE 54X76 (DRAPES) ×6 IMPLANT
DRAPE INCISE IOBAN 66X45 STRL (DRAPES) ×3 IMPLANT
DRAPE ORTHO SPLIT 77X108 STRL (DRAPES) ×6
DRAPE SURG ORHT 6 SPLT 77X108 (DRAPES) ×2 IMPLANT
DRAPE U-SHAPE 47X51 STRL (DRAPES) ×3 IMPLANT
DRAPE X-RAY CASS 24X20 (DRAPES) IMPLANT
DRILL 2.5 (BIT) ×3
DRSG ADAPTIC 3X8 NADH LF (GAUZE/BANDAGES/DRESSINGS) ×3 IMPLANT
DRSG PAD ABDOMINAL 8X10 ST (GAUZE/BANDAGES/DRESSINGS) ×3 IMPLANT
DURAPREP 26ML APPLICATOR (WOUND CARE) ×3 IMPLANT
ELECT BLADE 4.0 EZ CLEAN MEGAD (MISCELLANEOUS) ×3
ELECT NDL TIP 2.8 STRL (NEEDLE) ×1 IMPLANT
ELECT NEEDLE TIP 2.8 STRL (NEEDLE) ×3 IMPLANT
ELECT REM PT RETURN 9FT ADLT (ELECTROSURGICAL) ×3
ELECTRODE BLDE 4.0 EZ CLN MEGD (MISCELLANEOUS) ×1 IMPLANT
ELECTRODE REM PT RTRN 9FT ADLT (ELECTROSURGICAL) ×1 IMPLANT
GAUZE SPONGE 4X4 12PLY STRL (GAUZE/BANDAGES/DRESSINGS) ×3 IMPLANT
GLOVE BIOGEL PI ORTHO PRO 7.5 (GLOVE) ×2
GLOVE BIOGEL PI ORTHO PRO SZ8 (GLOVE) ×2
GLOVE ORTHO TXT STRL SZ7.5 (GLOVE) ×3 IMPLANT
GLOVE PI ORTHO PRO STRL 7.5 (GLOVE) ×1 IMPLANT
GLOVE PI ORTHO PRO STRL SZ8 (GLOVE) ×1 IMPLANT
GLOVE SURG ORTHO 8.5 STRL (GLOVE) ×3 IMPLANT
GOWN STRL REUS W/ TWL LRG LVL3 (GOWN DISPOSABLE) ×1 IMPLANT
GOWN STRL REUS W/ TWL XL LVL3 (GOWN DISPOSABLE) ×2 IMPLANT
GOWN STRL REUS W/TWL LRG LVL3 (GOWN DISPOSABLE) ×3
GOWN STRL REUS W/TWL XL LVL3 (GOWN DISPOSABLE) ×6
HANDPIECE INTERPULSE COAX TIP (DISPOSABLE)
KIT BASIN OR (CUSTOM PROCEDURE TRAY) ×3 IMPLANT
KIT ROOM TURNOVER OR (KITS) ×3 IMPLANT
MANIFOLD NEPTUNE II (INSTRUMENTS) ×3 IMPLANT
NDL 1/2 CIR MAYO (NEEDLE) ×1 IMPLANT
NEEDLE 1/2 CIR MAYO (NEEDLE) IMPLANT
NEEDLE HYPO 25GX1X1/2 BEV (NEEDLE) ×3 IMPLANT
NS IRRIG 1000ML POUR BTL (IV SOLUTION) ×3 IMPLANT
PACK SHOULDER (CUSTOM PROCEDURE TRAY) ×3 IMPLANT
PAD ARMBOARD 7.5X6 YLW CONV (MISCELLANEOUS) ×6 IMPLANT
PIN GUIDE 1.2 (PIN) ×2 IMPLANT
PIN GUIDE GLENOPHERE 1.5MX300M (PIN) ×2 IMPLANT
PIN METAGLENE 2.5 (PIN) ×2 IMPLANT
SET HNDPC FAN SPRY TIP SCT (DISPOSABLE) IMPLANT
SLING ARM LRG ADULT FOAM STRAP (SOFTGOODS) ×2 IMPLANT
SLING ARM MED ADULT FOAM STRAP (SOFTGOODS) IMPLANT
SPONGE LAP 18X18 X RAY DECT (DISPOSABLE) IMPLANT
SPONGE LAP 4X18 X RAY DECT (DISPOSABLE) ×3 IMPLANT
STRIP CLOSURE SKIN 1/2X4 (GAUZE/BANDAGES/DRESSINGS) ×2 IMPLANT
SUCTION FRAZIER HANDLE 10FR (MISCELLANEOUS) ×2
SUCTION TUBE FRAZIER 10FR DISP (MISCELLANEOUS) ×1 IMPLANT
SUT FIBERWIRE #2 38 T-5 BLUE (SUTURE) ×3
SUT MNCRL AB 4-0 PS2 18 (SUTURE) ×3 IMPLANT
SUT VIC AB 0 CT2 27 (SUTURE) ×2 IMPLANT
SUT VIC AB 2-0 CT1 27 (SUTURE) ×3
SUT VIC AB 2-0 CT1 TAPERPNT 27 (SUTURE) ×1 IMPLANT
SUT VICRYL 0 CT 1 36IN (SUTURE) ×3 IMPLANT
SUTURE FIBERWR #2 38 T-5 BLUE (SUTURE) ×2 IMPLANT
SYR CONTROL 10ML LL (SYRINGE) ×3 IMPLANT
TOWEL OR 17X24 6PK STRL BLUE (TOWEL DISPOSABLE) ×3 IMPLANT
TOWEL OR 17X26 10 PK STRL BLUE (TOWEL DISPOSABLE) ×3 IMPLANT
TOWER CARTRIDGE SMART MIX (DISPOSABLE) ×2 IMPLANT
TRAY FOLEY CATH 16FRSI W/METER (SET/KITS/TRAYS/PACK) IMPLANT
WATER STERILE IRR 1000ML POUR (IV SOLUTION) ×3 IMPLANT
YANKAUER SUCT BULB TIP NO VENT (SUCTIONS) ×3 IMPLANT

## 2016-03-29 NOTE — Anesthesia Postprocedure Evaluation (Signed)
Anesthesia Post Note  Patient: [name]  Procedure(s) Performed: Procedure(s) (LRB): RIGHT REVERSE SHOULDER ARTHROPLASTY (Right)  Patient location during evaluation: PACU Anesthesia Type: General Level of consciousness: awake and alert Pain management: pain level controlled Vital Signs Assessment: post-procedure vital signs reviewed and stable Respiratory status: spontaneous breathing, nonlabored ventilation, respiratory function stable and patient connected to nasal cannula oxygen Cardiovascular status: blood pressure returned to baseline and stable Postop Assessment: no signs of nausea or vomiting Anesthetic complications: no    Last Vitals:  Vitals:   03/29/16 1010 03/29/16 1025  BP: 119/65 (!) 105/59  Pulse:  (!) 53  Resp:  13  Temp:      Last Pain:  Vitals:   03/29/16 0642  TempSrc: Oral                 Nilda Simmer

## 2016-03-29 NOTE — Anesthesia Procedure Notes (Signed)
Procedure Name: Intubation Date/Time: 03/29/2016 7:43 AM Performed by: Salli Quarry Lorin Gawron Pre-anesthesia Checklist: Patient identified, Emergency Drugs available, Suction available and Patient being monitored Patient Re-evaluated:Patient Re-evaluated prior to inductionOxygen Delivery Method: Circle System Utilized Preoxygenation: Pre-oxygenation with 100% oxygen Intubation Type: IV induction Ventilation: Mask ventilation without difficulty Laryngoscope Size: Mac and 3 Grade View: Grade I Tube type: Oral Tube size: 7.0 mm Number of attempts: 1 Airway Equipment and Method: Stylet Placement Confirmation: ETT inserted through vocal cords under direct vision,  positive ETCO2 and breath sounds checked- equal and bilateral Secured at: 22 cm Tube secured with: Tape Dental Injury: Teeth and Oropharynx as per pre-operative assessment

## 2016-03-29 NOTE — Interval H&P Note (Signed)
History and Physical Interval Note:  03/29/2016 7:29 AM  [name]  has presented today for surgery, with the diagnosis of right shoulder osteoarthritis and rotator cuff insuffiency  The various methods of treatment have been discussed with the patient and family. After consideration of risks, benefits and other options for treatment, the patient has consented to  Procedure(s): RIGHT REVERSE SHOULDER ARTHROPLASTY (Right) as a surgical intervention .  The patient's history has been reviewed, patient examined, no change in status, stable for surgery.  I have reviewed the patient's chart and labs.  Questions were answered to the patient's satisfaction.     Shmuel Girgis,STEVEN R

## 2016-03-29 NOTE — Progress Notes (Signed)
At [facility] today patient chewing gum at this time, explained to patient the risk of chewing gum prior to surgery and encouraged patient to remove gum now. Patient refused to spit out gum and insisted on chewing gum up until surgery time. Anesthesiologist aware.

## 2016-03-29 NOTE — Discharge Instructions (Signed)
May remove the sling and use the arm as you can.  Ice the shoulder as much as you can.  Ok to get it wet in the shower in one week, keep covered and clean and dry until then.  Use the sling if needed  Follow up with Dr Veverly Fells in two weeks  [id number]

## 2016-03-29 NOTE — Op Note (Signed)
NAMEESHANTI, MOSCONE NO.:  [id number]  MEDICAL RECORD NO.:  [id number]  LOCATION:  [id number]                        FACILITY:  [facility]  PHYSICIAN:  Doran Heater. Veverly Fells, M.D. DATE OF BIRTH:  09/16/1930  DATE OF PROCEDURE:  03/29/2016 DATE OF DISCHARGE:                              OPERATIVE REPORT   PREOPERATIVE DIAGNOSIS:  Right shoulder end-stage arthritis with rotator cuff insufficiency.  POSTOPERATIVE DIAGNOSIS:  Right shoulder end-stage arthritis with rotator cuff insufficiency.  PROCEDURE PERFORMED:  Right reverse total shoulder arthroplasty using DePuy Delta Xtend prosthesis.  ATTENDING SURGEON:  Doran Heater. Veverly Fells, MD.  ASSISTANT:  Abbott Pao. Dixon, PA-C, who was scrubbed the entire procedure and necessary for satisfactory completion of surgery.  ANESTHESIA:  General anesthesia was used plus interscalene block.  ESTIMATED BLOOD LOSS:  Minimal.  FLUID REPLACEMENT:  1000 mL crystalloid.  INSTRUMENT COUNTS:  Correct.  COMPLICATIONS:  There were no complications.  ANTIBIOTICS:  Perioperative antibiotics were given.  INDICATIONS:  The patient is an 82 year old female with progressive shoulder pain in the right shoulder secondary to rotator cuff tear arthropathy.  The patient has had progressive pain despite conservative management including modification, activity, injections and pain medication and exercises.  The patient now presents for reverse total shoulder arthroplasty to restore function and eliminate pain to her shoulder.  Informed consent obtained.  DESCRIPTION OF PROCEDURE:  After an adequate level of anesthesia achieved, the patient positioned in the modified beach-chair position. Right shoulder was correctly identified and sterilely prepped and draped in usual manner.  Time-out called.  We entered the shoulder using a deltopectoral incision started at the coracoid process extending down to the anterior humerus.  Dissection down through the  subcutaneous tissues using the Bovie, identified the cephalic vein, took it laterally with the deltoid, pectoralis taken medially.  Conjoint tendon identified and retracted medially.  The subscapularis was released off the lesser tuberosity.  Biceps tendon tenodesed at the pec insertion on the humerus and neutral tension, the subscap was in bad shape with significant tearing and thus was not repairable, but we would retract it, used it to retract during the exposure and protection of the axillary nerve.  Next, we went ahead and released the inferior humeral capsule with progressive external rotation.  We cut the biceps and rotator cuff.  Supraspinatus and infraspinatus were gone, we preserved the teres minor.  We extended the shoulder, entered the proximal humerus with a 6-mm reamer and reamed up to size 14.  We then placed our intramedullary guide for the head resection, resected in 10 degrees of retroversion with the oscillating saw.  We removed the excess bone spurs with the large rongeur.  We then prepared the proximal metaphysis with the epi2 right reamer and reamed for the metaphyseal portion of the component.  We then took the 14 body and the epi2 right metaphysis, set on the 0 setting and placed in 10 degrees of retroversion and impacted that in position, we reduced the shoulder and subluxed it posteriorly.  We did a 360-degree capsulolabral removal, protecting the axillary nerve.  We then identified our center point on our glenoid face and drilled our guidepin.  We then reamed for the  metaglene and drilled out the central peg hole for the metaglene and then impacted the metaglene in position, located inferiorly on the glenoid.  We then placed a 48 locked screw inferiorly, 30 proximally at the base of the coracoid and then 18 anteriorly and we could not get one posteriorly due to fear of breaking the posterior edge of the glenoid. So, we had well-seated secure metaglene, to which,  we affixed a 38 standard glenosphere and with standard technique with impaction and screwing that in.  Once that was in place, we double checked the axillary nerve to make sure it was free and clear.  We then reduced the shoulder with 38+ 3 trial, which was excellent tension with good tension on the conjoined tendon.  No gapping within inferior pole on the humerus and then no gapping with external rotation.  We removed the trial components.  We irrigated thoroughly.  The patient's bone quality on the humeral side was very poor.  Thus, we elected to do a hybrid with a press-fit HA-coated stem, but cementing distally, so we vacuum-mixed the DePuy 1 cement on the back table, hand packed that after we had irrigated and dried out the canal distally and then, we did bone grafting up into a fairly large cystic defect in the lateral aspect of the humerus near the greater tuberosity.  Thus, we bone grafted all that and then impacted the HA stem in.  So, we are going to have HA ongrowth proximally and then the cement distally for initial fixation, but we had 10 degrees of retroversion.  The real stem in place and then placed a 38+ 3 real poly, impacted that, reduced the shoulder and had excellent again soft tissue balancing and tension and stability.  We then irrigated thoroughly, removed the remaining subscap tendon and then closed the deltopectoral interval with 0 Vicryl suture followed by 2-0 Vicryl for subcutaneous closure and 4-0 Monocryl for skin.  Steri-Strips applied followed by the sterile dressing.  The patient tolerated the surgery well.     Doran Heater. Veverly Fells, M.D.     SRN/MEDQ  D:  03/29/2016  T:  03/29/2016  Job:  [id number]

## 2016-03-29 NOTE — Brief Op Note (Signed)
03/29/2016  9:58 AM  PATIENT:  Burnetta Sabin  82 y.o. female  PRE-OPERATIVE DIAGNOSIS:  right shoulder osteoarthritis and rotator cuff insuffiency  POST-OPERATIVE DIAGNOSIS:  right shoulder osteoarthritis and rotator cuff insuffiency  PROCEDURE:  Procedure(s): RIGHT REVERSE SHOULDER ARTHROPLASTY (Right)  DePuy Delta Xtend SURGEON:  Surgeon(s) and Role:    * Netta Cedars, MD - Primary  PHYSICIAN ASSISTANT:   ASSISTANTS: Ventura Bruns, PA-C   ANESTHESIA:   regional and general  EBL:  Total I/O In: 1200 [I.V.:1200] Out: 250 [Blood:250]  BLOOD ADMINISTERED:none  DRAINS: none   LOCAL MEDICATIONS USED:  MARCAINE     SPECIMEN:  No Specimen  DISPOSITION OF SPECIMEN:  N/A  COUNTS:  YES  TOURNIQUET:  * No tourniquets in log *  DICTATION: .Other Dictation: Dictation Number [id number]  PLAN OF CARE: Admit to inpatient   PATIENT DISPOSITION:  PACU - hemodynamically stable.   Delay start of Pharmacological VTE agent (>24hrs) due to surgical blood loss or risk of bleeding: not applicable

## 2016-03-29 NOTE — Anesthesia Procedure Notes (Signed)
Anesthesia Regional Block:  Interscalene brachial plexus block  Pre-Anesthetic Checklist: ,, timeout performed, Correct Patient, Correct Site, Correct Laterality, Correct Procedure, Correct Position, site marked, Risks and benefits discussed,  Surgical consent,  Pre-op evaluation,  At surgeon's request and post-op pain management  Laterality: Right  Prep: chloraprep       Needles:  Injection technique: Single-shot  Needle Type: Echogenic Stimulator Needle     Needle Length: 9cm 9 cm Needle Gauge: 21 and 21 G    Additional Needles:  Procedures: ultrasound guided (picture in chart) Interscalene brachial plexus block Narrative:  Start time: 03/29/2016 7:15 AM End time: 03/29/2016 7:20 AM Injection made incrementally with aspirations every 5 mL.  Performed by: Personally  Anesthesiologist: Nilda Simmer

## 2016-03-29 NOTE — Transfer of Care (Signed)
Immediate Anesthesia Transfer of Care Note  Patient: [name]  Procedure(s) Performed: Procedure(s): RIGHT REVERSE SHOULDER ARTHROPLASTY (Right)  Patient Location: PACU  Anesthesia Type:GA combined with regional for post-op pain  Level of Consciousness: awake, alert  and patient cooperative  Airway & Oxygen Therapy: Patient Spontanous Breathing and Patient connected to nasal cannula oxygen  Post-op Assessment: Report given to RN and Post -op Vital signs reviewed and stable  Post vital signs: Reviewed and stable  Last Vitals:  Vitals:   03/29/16 0642  BP: (!) 150/61  Pulse: (!) 58  Resp: 20  Temp: 36.9 C    Last Pain:  Vitals:   03/29/16 0642  TempSrc: Oral      Patients Stated Pain Goal: 3 ([phone])  Complications: No apparent anesthesia complications

## 2016-03-30 LAB — HEMOGLOBIN AND HEMATOCRIT, BLOOD
HEMATOCRIT: 36.8 % (ref 36.0–46.0)
Hemoglobin: 11.7 g/dL — ABNORMAL LOW (ref 12.0–15.0)

## 2016-03-30 LAB — BASIC METABOLIC PANEL
Anion gap: 9 (ref 5–15)
BUN: 12 mg/dL (ref 6–20)
CHLORIDE: 100 mmol/L — AB (ref 101–111)
CO2: 25 mmol/L (ref 22–32)
Calcium: 8.9 mg/dL (ref 8.9–10.3)
Creatinine, Ser: 0.71 mg/dL (ref 0.44–1.00)
GFR calc Af Amer: >60 mL/min (ref >60)
GLUCOSE: 152 mg/dL — AB (ref 65–99)
POTASSIUM: 3.9 mmol/L (ref 3.5–5.1)
Sodium: 134 mmol/L — ABNORMAL LOW (ref 135–145)

## 2016-03-30 MED ORDER — IBUPROFEN 200 MG PO TABS
400.0000 mg | ORAL_TABLET | Freq: Two times a day (BID) | ORAL | Status: DC
  Administered 2016-03-30: 400 mg via ORAL
  Filled 2016-03-30 (×2): qty 2

## 2016-03-30 NOTE — Discharge Summary (Signed)
Patient ID: [name] MRN: [id number] DOB/AGE: 1931/04/03 82 y.o.  Admit date: 03/29/2016 Discharge date: 03/31/2016  Admission Diagnoses: right shoulder arthritis, htn, obesity Active Problems:   S/P shoulder replacement   Discharge Diagnoses:  Same S/p shoulder replacement  Past Medical History:  Diagnosis Date  . Anemia   . Asthma    seasonal    . AVM (arteriovenous malformation)   . Bipolar disorder ([place])   . Cancer (HCC)     right leg  skin  . Depression    sucide attempt in the distant past  . Excessive sweating   . GI bleed   . Hiatal hernia    s/p repair  . HLD (hyperlipidemia)   . HTN (hypertension)   . Hypertension   . Low back pain    ESI by Dr. Nelva Bush 2 weeks ago  . OA (osteoarthritis)    bilateral wrist, bilateral knees  . Obesity   . Obesity   . Ovarian cyst   . Right wrist injury    multiple surgeries and residual weakness.   . Rotator cuff arthropathy    right  . Seizures ([place]) 1980s   2/2 meningioma. none since cranioplasty  . Shortness of breath dyspnea    if gets excited  . Tuberculosis    + tb test      (father had)    Surgeries: Procedure(s): RIGHT REVERSE SHOULDER ARTHROPLASTY on 03/29/2016   Consultants: none  Discharged Condition:  Stable.  Hospital Course: [name] is an 82 y.o. female who was admitted 03/29/2016 for operative treatment of osteoarthritis right shoulder. Patient has severe unremitting pain that affects sleep, daily activities, and work/hobbies. After pre-op clearance the patient was taken to the operating room on 03/29/2016 and underwent  Procedure(s): RIGHT REVERSE SHOULDER ARTHROPLASTY.    Patient was given perioperative antibiotics:  Anti-infectives    Start     Dose/Rate Route Frequency Ordered Stop   03/29/16 1700  clindamycin (CLEOCIN) IVPB 600 mg     600 mg 100 mL/hr over 30 Minutes Intravenous Every 6 hours 03/29/16 1605 03/30/16 1059   03/29/16 0615  clindamycin (CLEOCIN) IVPB 900 mg     900  mg 100 mL/hr over 30 Minutes Intravenous On call to O.R. 03/29/16 0615 03/29/16 0750   03/29/16 0521  clindamycin (CLEOCIN) IVPB 900 mg  Status:  Discontinued     900 mg 100 mL/hr over 30 Minutes Intravenous On call to O.R. 03/29/16 [id number] 03/29/16 0617       Patient was given sequential compression devices, early ambulation, and chemoprophylaxis to prevent DVT.  Patient benefited maximally from hospital stay and there were no complications.    Recent vital signs:  Patient Vitals for the past 24 hrs:  BP Temp Temp src Pulse Resp SpO2  03/31/16 0650 (!) 117/44 97.5 F (36.4 C) Oral (!) 57 18 94 %  03/30/16 2100 - 98.6 F (37 C) Oral - - -  03/30/16 2022 (!) 130/54 98.7 F (37.1 C) Oral 67 20 95 %  03/30/16 1959 - 99.9 F (37.7 C) Oral - - -  03/30/16 1542 129/76 98.1 F (36.7 C) Oral 72 20 95 %     Recent laboratory studies:   Recent Labs  03/30/16 0436  HGB 11.7*  HCT 36.8  NA 134*  K 3.9  CL 100*  CO2 25  BUN 12  CREATININE 0.71  GLUCOSE 152*  CALCIUM 8.9     Discharge Medications:     Medication List  STOP taking these medications   acyclovir ointment 5 % Commonly known as:  ZOVIRAX   fluconazole 100 MG tablet Commonly known as:  DIFLUCAN     TAKE these medications   amphetamine-dextroamphetamine 10 MG tablet Commonly known as:  ADDERALL Take 1 tablet (10 mg total) by mouth daily.   aspirin 81 MG tablet Take 1 tablet (81 mg total) by mouth daily.   atorvastatin 10 MG tablet Commonly known as:  LIPITOR TAKE 1 TABLET BY MOUTH EVERY NIGHT AT BEDTIME   cloNIDine 0.1 mg/24hr patch Commonly known as:  CATAPRES - Dosed in mg/24 hr APPLY 1 PATCH(0.1 MG) EXTERNALLY TO THE SKIN 1 TIME A WEEK   CoQ10 200 MG Caps Take 1 tablet by mouth daily.   FLUoxetine 40 MG capsule Commonly known as:  PROZAC Take 1 capsule (40 mg total) by mouth daily.   gabapentin 300 MG capsule Commonly known as:  NEURONTIN Take 1 capsule (300 mg total) by mouth 3 (three)  times daily.   HYDROcodone-acetaminophen 5-325 MG tablet Commonly known as:  NORCO Take 1-2 tablets by mouth every 6 (six) hours as needed for moderate pain.   ibuprofen 400 MG tablet Commonly known as:  ADVIL,MOTRIN Take 1 tablet (400 mg total) by mouth 2 (two) times daily. What changed:  medication strength  how much to take  when to take this   metoprolol succinate 25 MG 24 hr tablet Commonly known as:  TOPROL-XL Take 1 tablet (25 mg total) by mouth daily.   metroNIDAZOLE 0.75 % gel Commonly known as:  METROGEL Apply 1 application topically daily as needed. Acne rosacea   propantheline 15 MG tablet Commonly known as:  PROBANTHINE Take 1 tablet (15 mg total) by mouth 3 (three) times daily with meals. Start with 1/2 tab       Diagnostic Studies: Dg Shoulder Right Port  Result Date: 03/29/2016 CLINICAL DATA:  Post shoulder replacement EXAM: PORTABLE RIGHT SHOULDER - 2+ VIEW COMPARISON:  11/27/2012 FINDINGS: Interval right shoulder replacement. No hardware complicating feature. Normal AP alignment. IMPRESSION: Right shoulder replacement.  No visible complicating feature. Electronically Signed   By: Rolm Baptise M.D.   On: 03/29/2016 11:00    Disposition: 01-Home or Self Care  Discharge Instructions    Call MD / Call 911    Complete by:  As directed   If you experience chest pain or shortness of breath, CALL 911 and be transported to the hospital emergency room.  If you develope a fever above 101 F, pus (white drainage) or increased drainage or redness at the wound, or calf pain, call your surgeon's office.   Call MD / Call 911    Complete by:  As directed   If you experience chest pain or shortness of breath, CALL 911 and be transported to the hospital emergency room.  If you develope a fever above 101 F, pus (white drainage) or increased drainage or redness at the wound, or calf pain, call your surgeon's office.   Constipation Prevention    Complete by:  As directed    Drink plenty of fluids.  Prune juice may be helpful.  You may use a stool softener, such as Colace (over the counter) 100 mg twice a day.  Use MiraLax (over the counter) for constipation as needed.   Constipation Prevention    Complete by:  As directed   Drink plenty of fluids.  Prune juice may be helpful.  You may use a stool softener, such as Colace (over  the counter) 100 mg twice a day.  Use MiraLax (over the counter) for constipation as needed.   Diet - low sodium heart healthy    Complete by:  As directed   Diet - low sodium heart healthy    Complete by:  As directed   Increase activity slowly as tolerated    Complete by:  As directed   Increase activity slowly as tolerated    Complete by:  As directed      Follow-up Information    NORRIS,STEVEN R, MD. Call in 2 weeks.   Specialty:  Orthopedic Surgery Why:  [id number] Contact information: [address] [place] [address] [id number]            Signed: Wylene Simmer 03/31/2016, 9:17 AM

## 2016-03-30 NOTE — Evaluation (Signed)
Physical Therapy Evaluation Patient Details Name: [name] MRN: [id number] DOB: 1930-11-19 Today's Date: 03/30/2016   History of Present Illness  Right reverse total shoulder arthroplasty. PMHx: Depression with suicide attempt in past, Bipolar, seizures due to menigoma (cranioplasty 1980s), bil TKR  Clinical Impression  Pt admitted with above diagnosis and presents to PT with functional limitations due to deficits listed below (See PT problem list). Pt needs skilled PT to maximize independence and safety prior to discharge. Pt was falling at home prior to surgery including a fall the day before surgery. Feel the pt needs ST-SNF but she is adamantly opposed to this and wants to return home to her dogs. She is agreeable to HHPT so that may be the best we can do given her resistance to SNF.     Follow Up Recommendations SNF (but pt adamantly refuses so would do HHPT)    Equipment Recommendations  None recommended by PT    Recommendations for Other Services       Precautions / Restrictions Precautions Precautions: Fall;Shoulder;Other (comment) (per neighbor that helps her out, she has fallen a couple of times in the last couple of months) Type of Shoulder Precautions: AROM elbow, wrist, hand; AROM/PROM shoulder flexion 90 degrees, shoulder abduction 60 degrees, shoulder external rotation 30 degrees Shoulder Interventions: Shoulder sling/immobilizer;For comfort (wear for sleeping) Required Braces or Orthoses: Sling Restrictions Weight Bearing Restrictions: Yes RUE Weight Bearing: Non weight bearing      Mobility  Bed Mobility Overal bed mobility: Needs Assistance Bed Mobility: Supine to Sit     Supine to sit: Min assist     General bed mobility comments: Pt unable to come to sitting without assistance. Attempted x 2-3 minutes without success. Eventually used my hand to pull trunk up using lt hand.  Transfers Overall transfer level: Needs assistance Equipment used:  None Transfers: Sit to/from Stand Sit to Stand: Supervision         General transfer comment: supervision for safety  Ambulation/Gait Ambulation/Gait assistance: Min guard Ambulation Distance (Feet): 100 Feet Assistive device: None;Straight cane Gait Pattern/deviations: Step-through pattern;Decreased stride length;Wide base of support;Drifts right/left     General Gait Details: Gait unsteady but pt able to recover balance without assist. Had pt use cane but pt proceeded to carry it.   Stairs            Wheelchair Mobility    Modified Rankin (Stroke Patients Only)       Balance Overall balance assessment: Needs assistance Sitting-balance support: No upper extremity supported;Feet supported Sitting balance-Leahy Scale: Good     Standing balance support: No upper extremity supported;During functional activity Standing balance-Leahy Scale: Fair Standing balance comment: says he cannot use SPC in her left hand                             Pertinent Vitals/Pain Pain Assessment: 0-10 Pain Score: 4  Pain Location: rt shoulder Pain Descriptors / Indicators: Sore Pain Intervention(s): Limited activity within patient's tolerance;Monitored during session;Ice applied    Home Living Family/patient expects to be discharged to:: Private residence Living Arrangements: Alone Available Help at Discharge: Neighbor (in and out) Type of Home: House Home Access: Level entry     Home Layout: One level Home Equipment: Clinical cytogeneticist - 2 wheels;Adaptive equipment      Prior Function Level of Independence: Independent               Hand Dominance  Dominant Hand: Right    Extremity/Trunk Assessment   Upper Extremity Assessment: Defer to OT evaluation RUE Deficits / Details: reverse shoulder this admisson; elbow to hand WNL/WFL         Lower Extremity Assessment: Overall WFL for tasks assessed         Communication   Communication: No  difficulties  Cognition Arousal/Alertness: Awake/alert Behavior During Therapy: WFL for tasks assessed/performed Overall Cognitive Status: No family/caregiver present to determine baseline cognitive functioning Area of Impairment: Safety/judgement         Safety/Judgement: Decreased awareness of safety (given new surgery--thinks she can manage as she was pta)     General Comments: Suspect this lack of safety is not new    General Comments      Exercises Other Exercises Other Exercises: Attempted to have pt to do lap slides--after 5 reps she said she was worn out--she did manage to do 5 more, then we started on external rotation, but only a couple reps into it she said she could do nomore; was unable to teach her abduction. Explained to her that she needed to be able to these 3-5 times a day 10 reps each. Donning/doffing shirt without moving shoulder: Maximal assistance Method for sponge bathing under operated UE: Minimal assistance Donning/doffing sling/immobilizer:  (pt does not want to wear it, the strap bothers her neck . She said she was told by her MD/PA that she did not need to wear the sling) Correct positioning of sling/immobilizer:  (NA since pt does not want to wear it) Pendulum exercises (written home exercise program):  (NA) ROM for elbow, wrist and digits of operated UE: Supervision/safety Sling wearing schedule (on at all times/off for ADL's):  (per orders says sling for comfort and to be worn when sleeping--pt does not want to wear) Proper positioning of operated UE when showering:  (pt reports she will hold her arm up with shoulder bent as if it were in a sling) Dressing change:  (NA) Positioning of UE while sleeping:  (need to educate)      Assessment/Plan    PT Assessment Patient needs continued PT services  PT Diagnosis Difficulty walking;Abnormality of gait   PT Problem List Decreased balance;Decreased mobility;Decreased cognition;Decreased safety awareness   PT Treatment Interventions Gait training;Functional mobility training;Therapeutic activities;Balance training;Patient/family education   PT Goals (Current goals can be found in the Care Plan section) Acute Rehab PT Goals Patient Stated Goal: to go home  PT Goal Formulation: With patient Time For Goal Achievement: 04/06/16 Potential to Achieve Goals: Good    Frequency Min 3X/week   Barriers to discharge Decreased caregiver support pt lives alone with only intermittent support of neighbors    Co-evaluation               End of Session   Activity Tolerance: Patient tolerated treatment well Patient left: in chair;with call bell/phone within reach;with chair alarm set Nurse Communication: Mobility status         Time: [id number] PT Time Calculation (min) (ACUTE ONLY): 24 min   Charges:   PT Evaluation $PT Eval Moderate Complexity: 1 Procedure PT Treatments $Gait Training: 8-22 mins   PT G Codes:        Evalyn Shultis 04-05-16, 3:18 PM [facility] PT [phone]

## 2016-03-30 NOTE — Evaluation (Signed)
Occupational Therapy Evaluation Patient Details Name: [name] MRN: [id number] DOB: 11/18/1930 Today's Date: 03/30/2016    History of Present Illness Right reverse total shoulder arthroplasty. PMHx: Depression with suicide attempt in past, Bipolar, seizures due to menigoma (cranioplasty 91s)   Clinical Impression   This 82 yo female admitted and underwent above presents to acute OT with decreased balance, decreased activity tolerance, decreased use of RUE, h/o falls per neighbor friend with all of this making it unsafe to D/C home by herself with only intermittent A/S from neighbors. I highly recommend follow up OT at SNF, HHPT consult while here, if pt does go home then I recommend 24 hour S/with prn A, HHOT, and HHaide. Of note and RN aware pt did state that when she feels she is taking more from society than giving she plans on ending her life (she reports she does not have any definite plan right now)    Follow Up Recommendations  SNF;Other (comment);Supervision/Assistance - 24 hour (if pt refuses then need HHOT, HHAide)    Equipment Recommendations  None recommended by OT       Precautions / Restrictions Precautions Precautions: Fall;Shoulder;Other (comment) (per neighbor that helps her out, she has fallen a couple of times in the last couple of months) Type of Shoulder Precautions: AROM elbow, wrist, hand; AROM/PROM shoulder flexion 90 degrees, shoulder abduction 60 degrees, shoulder external rotation 30 degrees Shoulder Interventions: Shoulder sling/immobilizer;For comfort (wear for sleeping) Precaution Comments: Does not make it very far ambulating due to extreme hotness/sweating and fatigue/weakness (pt reports this was this way prior to admission--but more so now) Required Braces or Orthoses: Sling Restrictions Weight Bearing Restrictions: Yes RUE Weight Bearing: Non weight bearing      Mobility Bed Mobility Overal bed mobility: Modified Independent              General bed mobility comments: increased time, sits straight up and then swings around due to she cannot WB through RUE now and can only get up on the right side of her bed at home  Transfers Overall transfer level: Needs assistance Equipment used: None Transfers: Sit to/from Stand Sit to Stand: Min assist         General transfer comment: Ambulated with min A 10 feet then got so hot and worn out we had to turn around and head back to her room (one the way back even though she was worn out she kept stopping and asking what things were and what things looked like)    Balance Overall balance assessment: Needs assistance Sitting-balance support: No upper extremity supported;Feet supported Sitting balance-Leahy Scale: Good     Standing balance support: No upper extremity supported Standing balance-Leahy Scale: Poor Standing balance comment: says he cannot use SPC in her left hand                            ADL Overall ADL's : Needs assistance/impaired Eating/Feeding: Set up;Sitting   Grooming: Brushing hair;Supervision/safety;Set up;Sitting   Upper Body Bathing: Moderate assistance;Sitting   Lower Body Bathing: Moderate assistance (min A sit<>stand)   Upper Body Dressing : Maximal assistance;Sitting Upper Body Dressing Details (indicate cue type and reason): button up shirt     Toilet Transfer: Minimal assistance;Ambulation Toilet Transfer Details (indicate cue type and reason): no AD Toileting- Clothing Manipulation and Hygiene: Moderate assistance (for getting clothing up; min A for clothing down;min A sit<>stand)  Pertinent Vitals/Pain Pain Assessment: 0-10 Pain Score: 4  Pain Location: right shoulder Pain Descriptors / Indicators: Sore Pain Intervention(s): Limited activity within patient's tolerance;Monitored during session;Premedicated before session (pt does not want anymore narcotics for pain management')     Hand  Dominance  right   Extremity/Trunk Assessment Upper Extremity Assessment Upper Extremity Assessment: RUE deficits/detail RUE Deficits / Details: reverse shoulder this admisson; elbow to hand WNL/WFL RUE Coordination: decreased gross motor           Communication  no issues   Cognition Arousal/Alertness: Awake/alert Behavior During Therapy: WFL for tasks assessed/performed Overall Cognitive Status: Impaired/Different from baseline Area of Impairment: Safety/judgement         Safety/Judgement: Decreased awareness of safety (given new surgery--thinks she can manage as she was pta)            Exercises   Other Exercises Other Exercises: Attempted to have pt to do lap slides--after 5 reps she said she was worn out--she did manage to do 5 more, then we started on external rotation, but only a couple reps into it she said she could do nomore; was unable to teach her abduction. Explained to her that she needed to be able to these 3-5 times a day 10 reps each.   Shoulder Instructions Shoulder Instructions Donning/doffing shirt without moving shoulder: Maximal assistance Method for sponge bathing under operated UE: Minimal assistance Donning/doffing sling/immobilizer:  (pt does not want to wear it, the strap bothers her neck . She said she was told by her MD/PA that she did not need to wear the sling) Correct positioning of sling/immobilizer:  (NA since pt does not want to wear it) Pendulum exercises (written home exercise program):  (NA) ROM for elbow, wrist and digits of operated UE: Supervision/safety Sling wearing schedule (on at all times/off for ADL's):  (per orders says sling for comfort and to be worn when sleeping--pt does not want to wear) Proper positioning of operated UE when showering:  (pt reports she will hold her arm up with shoulder bent as if it were in a sling) Dressing change:  (NA) Positioning of UE while sleeping:  (need to educate)    Home Living  Family/patient expects to be discharged to:: Private residence Living Arrangements: Alone Available Help at Discharge: Neighbor (in and out) Type of Home: House Home Access: Level entry     Home Layout: One level     Bathroom Shower/Tub: Occupational psychologist: Handicapped height     Home Equipment: Clinical cytogeneticist - 2 wheels;Adaptive equipment Adaptive Equipment: Reacher        Prior Functioning/Environment Level of Independence: Independent             OT Diagnosis: Generalized weakness;Acute pain   OT Problem List: Decreased strength;Decreased range of motion;Decreased activity tolerance;Obesity;Impaired UE functional use;Decreased safety awareness;Impaired balance (sitting and/or standing)   OT Treatment/Interventions: Self-care/ADL training;Therapeutic exercise;Patient/family education;Therapeutic activities;Balance training    OT Goals(Current goals can be found in the care plan section) Acute Rehab OT Goals Patient Stated Goal: to go home today OT Goal Formulation: With patient Time For Goal Achievement: 04/06/16 Potential to Achieve Goals: Good  OT Frequency: Min 3X/week   Barriers to D/C: Decreased caregiver support             End of Session Equipment Utilized During Treatment: Gait belt Nurse Communication: Mobility status (pt does not want anymore narcotics (only ibuprofen--NOT tylenol), pt is not safe to go home (she really needs SNF),  pt voiced to me that when she gets to the point that she is taking more from society than giving she will shoot herself. pt needs an acute)  Activity Tolerance: Patient limited by fatigue Patient left: in chair;with call bell/phone within reach;with chair alarm set   Time: 0926-1030 OT Time Calculation (min): 64 min Charges:  OT General Charges $OT Visit: 1 Procedure OT Evaluation $OT Eval Moderate Complexity: 1 Procedure OT Treatments $Self Care/Home Management : 23-37 mins $Therapeutic Exercise:  8-22 mins  Almon Register [id number] 03/30/2016, 11:37 AM

## 2016-03-30 NOTE — Progress Notes (Signed)
Subjective: 1 Day Post-Op Procedure(s) (LRB): RIGHT REVERSE SHOULDER ARTHROPLASTY (Right) Patient reports pain as mild. No other c/o. No numbness or tingling, her block has worn off. She would like to go home today.  Objective: Vital signs in last 24 hours: Temp:  [97.7 F (36.5 C)-99.4 F (37.4 C)] 98.2 F (36.8 C) (08/19 0537) Pulse Rate:  [53-85] 60 (08/19 0537) Resp:  [13-18] 18 (08/19 0537) BP: (105-161)/(47-79) 132/47 (08/19 0537) SpO2:  [91 %-99 %] 91 % (08/19 0537)  Intake/Output from previous day: 08/18 0701 - 08/19 0700 In: 2478.3 [P.O.:360; I.V.:2068.3; IV Piggyback:50] Out: 400 [Urine:150; Blood:250] Intake/Output this shift: No intake/output data recorded.   Recent Labs  03/30/16 0436  HGB 11.7*    Recent Labs  03/30/16 0436  HCT 36.8    Recent Labs  03/30/16 0436  NA 134*  K 3.9  CL 100*  CO2 25  BUN 12  CREATININE 0.71  GLUCOSE 152*  CALCIUM 8.9   No results for input(s): LABPT, INR in the last 72 hours.  Neurologically intact ABD soft Neurovascular intact Sensation intact distally Intact pulses distally Dorsiflexion/Plantar flexion intact Incision: dressing C/D/I and no drainage No cellulitis present Compartment soft no calf pain or sign of DVT  Assessment/Plan: 1 Day Post-Op Procedure(s) (LRB): RIGHT REVERSE SHOULDER ARTHROPLASTY (Right) Advance diet Up with therapy D/C IV fluids  Discussed D/C instructions Plan D/C home today Changed dressing Discussed with Dr. Nash Shearer, Conley Rolls. 03/30/2016, 8:35 AM
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
## 2016-03-31 MED ORDER — IBUPROFEN 400 MG PO TABS: 400.0000 mg | ORAL_TABLET | Freq: Two times a day (BID) | ORAL | 0 refills | Status: DC

## 2016-03-31 NOTE — Progress Notes (Signed)
Occupational Therapy Treatment Patient Details Name: Tierah Forgacs MRN: [id number] DOB: Jan 18, 1931 Today's Date: 03/31/2016    History of present illness Right reverse total shoulder arthroplasty. 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  Follow Up Recommendations  SNF;Other (comment) (really feel like pt needs SNF, but she is not agreeable so need to max out [facility] services (OT/PT/Aide))    Equipment Recommendations  None recommended by OT       Precautions / Restrictions Precautions Precautions: Fall;Shoulder Type of Shoulder Precautions: AROM elbow, wrist, hand; AROM/PROM shoulder flexion 90 degrees, shoulder abduction 60 degrees, shoulder external rotation 30 degrees Shoulder Interventions: Shoulder sling/immobilizer;For comfort Precaution Comments: Pt did better with ambulation today--further and without the sweating or fatigue Required Braces or Orthoses: Sling (pt does not like to wear it--says it bothers her neck to much--made her aware that the MD wants her to wear it for sleeping. I told her it would also be good to wear it when she is going out of the house so that hopefully no one will bump her on that sid) Restrictions Weight Bearing Restrictions: Yes RUE Weight Bearing: Non weight bearing       Mobility Bed Mobility Overal bed mobility: Needs Assistance Bed Mobility: Supine to Sit     Supine to sit: Supervision     General bed mobility comments: yesterday with OT pt came up to right side of bed no issues (came straight up,did not roll onto right shoulder). Per PT yesterday pt struggled and struggled to come up to EOB and needed Min A. Today pt was initially struggling until I cued  her to roll over onto her left side and then use her left arm to push herself up  Transfers Overall transfer level: Needs assistance Equipment used: None Transfers: Sit to/from Stand Sit to Stand: Supervision         General transfer comment: S ambulating around in her room, however out the hall with more space around her she was Minguard A    Balance Overall balance assessment: Needs assistance;History of Falls Sitting-balance support: Feet supported;No upper extremity supported Sitting balance-Leahy Scale: Good     Standing balance support: During functional activity Standing balance-Leahy Scale: Fair                     ADL Overall ADL's : Needs assistance/impaired Eating/Feeding: Modified independent;Sitting   Grooming: Supervision/safety;Standing;Wash/dry hands;Brushing hair;Oral care         Lower Body Dressing: Set up;Supervision/safety;Sit to/from stand   Toilet Transfer: Supervision/safety;Ambulation;Comfort height toilet   Toileting- Clothing Manipulation and Hygiene: Supervision/safety;Sit to/from stand                Vision   Perception   Praxis    Cognition   Behavior During Therapy: [facility] for tasks assessed/performed Overall Cognitive Status: No family/caregiver present to determine baseline cognitive functioning Area of Impairment: Safety/judgement          Safety/Judgement: Decreased awareness of safety     General Comments: Pt has had multiple falls at home and she does not see how having shoulder sx makes this any different if she falls again in regards to trying to catch herself  Exercises Other Exercises Other Exercises: Had pt perform 10 lap slides, 10 reps of abduction (almost can get to 60 degrees), 10 reps of external rotation (getting to approximately neutral) Donning/doffing shirt without moving shoulder: Modified independent   Shoulder Instructions Shoulder Instructions Donning/doffing shirt without moving  shoulder: Modified independent     General Comments      Pertinent Vitals/ Pain       Pain Assessment: 0-10 Pain Score: 2  Pain Location: right shoulder Pain Descriptors / Indicators: Sore Pain Intervention(s): Limited activity within patient's tolerance;Monitored during session;Repositioned;Ice applied         Frequency Min 3X/week     Progress Toward Goals  OT Goals(current goals can now be found in the care plan section)  Progress towards OT goals: Progressing toward goals     Plan Discharge plan remains appropriate       End of Session Equipment Utilized During Treatment:  (none)   Activity Tolerance Patient tolerated treatment well   Patient Left in chair;with call bell/phone within reach;with chair alarm set   Nurse Communication          Time: [phone] OT Time Calculation (min): 41 min  Charges: OT General Charges $OT Visit: 1 Procedure OT Treatments $Self Care/Home Management : 23-37 mins $Therapeutic Exercise: 8-22 mins  Almon Register [id number] 03/31/2016, 11:13 AM

## 2016-03-31 NOTE — Progress Notes (Signed)
CM met with pt in room and pt adamantly refusing recc SNF but is willing to accept HHPT/OPT from [facility].  CM has requested Rodena Piety, RN to please secure a face to face and HHPT/OT orders.  Tentative referral called to [facility] rep, Tiffany who is waiting for F2F and orders to be placed.  Pt denies need for additional DME (she has a 3n1 at home).  NO other CM needs were communicated.

## 2016-03-31 NOTE — Progress Notes (Signed)
Physical Therapy Treatment Patient Details Name: [name] MRN: [id number] DOB: 28-Apr-1931 Today's Date: 03/31/2016    History of Present Illness Right reverse total shoulder arthroplasty. 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 Balance seems slightly improved from yesterday's session but pt continues to exhibit balance deficits with staggering and instability as well as dyspnea on exertion. Pt high falls risk. Explained importance of not falling on surgical UE. Pt continues to refuse SNF so recommend maximizing [facility] services for safety and to improve balance. Will follow.   Follow Up Recommendations  SNF (adamantly refuses to maximize [facility] services including HHPT)     Equipment Recommendations  None recommended by PT    Recommendations for Other Services       Precautions / Restrictions Precautions Precautions: Fall;Shoulder;Other (comment) Type of Shoulder Precautions: AROM elbow, wrist, hand; AROM/PROM shoulder flexion 90 degrees, shoulder abduction 60 degrees, shoulder external rotation 30 degrees Shoulder Interventions: Shoulder sling/immobilizer;For comfort Required Braces or Orthoses: Sling Restrictions Weight Bearing Restrictions: Yes RUE Weight Bearing: Non weight bearing    Mobility  Bed Mobility               General bed mobility comments: Sitting in chair upon PT arrival.   Transfers Overall transfer level: Needs assistance Equipment used: None Transfers: Sit to/from Stand Sit to Stand: Supervision         General transfer comment: supervision for safety. Stood from Youth worker.   Ambulation/Gait Ambulation/Gait assistance: Min guard Ambulation Distance (Feet): 250 Feet Assistive device: None Gait Pattern/deviations: Step-through pattern;Decreased stride length;Staggering left;Staggering right;Wide base of support Gait velocity:  decreased   General Gait Details: Unsteady gait with staggering noted right/left and 1 instance of scissoring gait. 3/4 DOE. Reports as baseline. "maybe I need a walking stick."   Stairs            Wheelchair Mobility    Modified Rankin (Stroke Patients Only)       Balance Overall balance assessment: Needs assistance;History of Falls Sitting-balance support: Feet supported;No upper extremity supported Sitting balance-Leahy Scale: Good     Standing balance support: During functional activity Standing balance-Leahy Scale: Fair                      Cognition Arousal/Alertness: Awake/alert Behavior During Therapy: WFL for tasks assessed/performed Overall Cognitive Status: No family/caregiver present to determine baseline cognitive functioning Area of Impairment: Safety/judgement         Safety/Judgement: Decreased awareness of safety     General Comments: Suspect this lack of safety is not new    Exercises      General Comments General comments (skin integrity, edema, etc.): Pt not understanding why she cannot get up and walk independently. Explained pt is high fall risk and cannot fall on surgical UE.      Pertinent Vitals/Pain Pain Assessment: No/denies pain    Home Living                      Prior Function            PT Goals (current goals can now be found in the care plan section) Progress towards PT goals: Progressing toward goals    Frequency  Min 3X/week    PT Plan Current plan remains appropriate    Co-evaluation  End of Session   Activity Tolerance: Patient tolerated treatment well Patient left: in chair;with call bell/phone within reach;with chair alarm set     Time: [phone] PT Time Calculation (min) (ACUTE ONLY): 20 min  Charges:  $Gait Training: 8-22 mins                    G Codes:      Mailee Klaas A Lilinoe Acklin 03/31/2016, 9:45 AM Wray Kearns, [place], DPT [phone]

## 2016-03-31 NOTE — Plan of Care (Signed)
Problem: Safety: Goal: Ability to remain free from injury will improve Outcome: Progressing No fall or injury this shift. Safety precautions and fall prevention maintained  Problem: Pain Managment: Goal: General experience of comfort will improve Outcome: Progressing Medicated with Ibuprofen earlier this shift with full relief  Problem: Tissue Perfusion: Goal: Risk factors for ineffective tissue perfusion will decrease Outcome: Progressing No S/S of DVT noted, refused SCDs  Problem: Activity: Goal: Risk for activity intolerance will decrease Outcome: Progressing Ambulates to BR with one assistance, tolerates well  Problem: Fluid Volume: Goal: Ability to maintain a balanced intake and output will improve Outcome: Progressing tolerates PO fluids well, good urine output  Problem: Bowel/Gastric: Goal: Will not experience complications related to bowel motility Outcome: Progressing No gastric or bowel issues noted, last  BM on 03/29/2016

## 2016-03-31 NOTE — Progress Notes (Signed)
Occupational Therapy Treatment and Discharge Patient Details Name: [name] MRN: [id number] DOB: [birth year] Today's Date: 03/31/2016    History of present illness Right reverse total shoulder arthroplasty. 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 Pt does not agree to SNF so my recommendation is to max out [facility] services (HHOT/HHPT/HHAide). Pt D/Cing today so we will D/C from acute OT.  Follow Up Recommendations  SNF;Other (comment) (really feel like pt needs SNF, but she is not agreeable so need to max out [facility] services (OT/PT/Aide))    Equipment Recommendations  None recommended by OT       Precautions / Restrictions Precautions Precautions: Fall;Shoulder Type of Shoulder Precautions: AROM elbow, wrist, hand; AROM/PROM shoulder flexion 90 degrees, shoulder abduction 60 degrees, shoulder external rotation 30 degrees Shoulder Interventions: Shoulder sling/immobilizer;For comfort (and for sleeping per MD order) Precaution Comments: Pt did better with ambulation today--further and without the sweating or fatigue Required Braces or Orthoses: Sling (pt does not like to wear it--says it bothers her neck to much--made her aware that the MD wants her to wear it for sleeping. I told her it would also be good to wear it when she is going out of the house so that hopefully no one will bump her on that sid) Restrictions Weight Bearing Restrictions: Yes RUE Weight Bearing: Non weight bearing       Mobility Bed Mobility Overal bed mobility: Needs Assistance Bed Mobility: Supine to Sit     Supine to sit: Supervision     General bed mobility comments: Pt up in recliner when I arrived for second session  today  Transfers Overall transfer level: Needs assistance Equipment used: None Transfers: Sit to/from Stand Sit to Stand: Supervision         General transfer comment: S ambulating around in her room, however out the hall with more space around her she was Minguard A    Balance Overall balance assessment: History of Falls;Needs assistance Sitting-balance support: Feet supported;No upper extremity supported Sitting balance-Leahy Scale: Good     Standing balance support: During functional activity Standing balance-Leahy Scale: Fair                     ADL Overall ADL's : Needs assistance/impaired   Upper Body Bathing: Minimal assitance;Sitting     Upper Body Dressing : Supervision/safety;Set up (S sit<>stand)   Lower Body Dressing: Supervision/safety;Sit to/from stand       Tub/Shower Transfer Details (indicate cue type and reason): We discussed that she needed to have someone with her in the house when she was showering in case there was an issue.                    Cognition   Behavior During Therapy: WFL for tasks assessed/performed Overall Cognitive Status: No family/caregiver present to determine baseline cognitive functioning Area of Impairment: Safety/judgement          Safety/Judgement: Decreased awareness of safety     General Comments: Pt has had multiple falls at home and she does not see how having shoulder sx makes this any different if she falls again in regards to trying to catch herself      Exercises  Other Exercises Other Exercises: Handout with pictures and insturctions given to pt with neighbor present (so she could help reinforce them with pt) for shoulder flexion, abduction, and external rotation. Pt completed 10 reps each of lap slides, Assisted ROM of RUE using LUE (70 degrees), abduction (50 degrees), and external rotation (neutral) Donning/doffing shirt without moving shoulder: Modified independent Method for sponge bathing  under operated UE: Minimal assistance Donning/doffing sling/immobilizer:  (NA) Correct positioning of sling/immobilizer:  (NA) Pendulum exercises (written home exercise program):  (NA) ROM for elbow, wrist and digits of operated UE: Supervision/safety Proper positioning of operated UE when showering:  (NA) Dressing change:  (NA) Positioning of UE while sleeping:  (pt verbalized understanding)           Pertinent Vitals/ Pain       Pain Assessment: 0-10 Pain Score: 2  Pain Location: right shoulder Pain Descriptors / Indicators: Sore Pain Intervention(s): Limited activity within patient's tolerance;Monitored during session;Repositioned;Ice applied         Frequency Min 3X/week     Progress Toward Goals  OT Goals(current goals can now be found in the care plan section)  Progress towards OT goals: Progressing toward goals     Plan Discharge plan remains appropriate       End of Session Equipment Utilized During Treatment:  (none)   Activity Tolerance Patient tolerated treatment well   Patient Left in chair;with call bell/phone within reach;with chair alarm set   Nurse Communication          Time: 0940-1003 OT Time Calculation (min): 23 min  Charges: OT General Charges $OT Visit: 1 Procedure OT Treatments $Self Care/Home Management : 8-22 mins $Therapeutic Exercise: 8-22 mins  Almon Register [id number] 03/31/2016, 11:23 AM

## 2016-04-01 ENCOUNTER — Encounter (HOSPITAL_COMMUNITY): Payer: Self-pay | Admitting: Orthopedic Surgery

## 2016-04-01 DIAGNOSIS — Z471 Aftercare following joint replacement surgery: Secondary | ICD-10-CM | POA: Diagnosis not present

## 2016-04-01 DIAGNOSIS — I10 Essential (primary) hypertension: Secondary | ICD-10-CM | POA: Diagnosis not present

## 2016-04-01 DIAGNOSIS — M170 Bilateral primary osteoarthritis of knee: Secondary | ICD-10-CM | POA: Diagnosis not present

## 2016-04-01 DIAGNOSIS — M19032 Primary osteoarthritis, left wrist: Secondary | ICD-10-CM | POA: Diagnosis not present

## 2016-04-01 DIAGNOSIS — Z96611 Presence of right artificial shoulder joint: Secondary | ICD-10-CM | POA: Diagnosis not present

## 2016-04-01 DIAGNOSIS — F319 Bipolar disorder, unspecified: Secondary | ICD-10-CM | POA: Diagnosis not present

## 2016-04-01 DIAGNOSIS — M19031 Primary osteoarthritis, right wrist: Secondary | ICD-10-CM | POA: Diagnosis not present

## 2016-04-01 DIAGNOSIS — J45909 Unspecified asthma, uncomplicated: Secondary | ICD-10-CM | POA: Diagnosis not present

## 2016-04-01 DIAGNOSIS — E669 Obesity, unspecified: Secondary | ICD-10-CM | POA: Diagnosis not present

## 2016-04-01 DIAGNOSIS — D649 Anemia, unspecified: Secondary | ICD-10-CM | POA: Diagnosis not present

## 2016-04-04 DIAGNOSIS — M170 Bilateral primary osteoarthritis of knee: Secondary | ICD-10-CM | POA: Diagnosis not present

## 2016-04-04 DIAGNOSIS — I10 Essential (primary) hypertension: Secondary | ICD-10-CM | POA: Diagnosis not present

## 2016-04-04 DIAGNOSIS — E669 Obesity, unspecified: Secondary | ICD-10-CM | POA: Diagnosis not present

## 2016-04-04 DIAGNOSIS — J45909 Unspecified asthma, uncomplicated: Secondary | ICD-10-CM | POA: Diagnosis not present

## 2016-04-04 DIAGNOSIS — D649 Anemia, unspecified: Secondary | ICD-10-CM | POA: Diagnosis not present

## 2016-04-04 DIAGNOSIS — Z471 Aftercare following joint replacement surgery: Secondary | ICD-10-CM | POA: Diagnosis not present

## 2016-04-05 DIAGNOSIS — Z471 Aftercare following joint replacement surgery: Secondary | ICD-10-CM | POA: Diagnosis not present

## 2016-04-05 DIAGNOSIS — D649 Anemia, unspecified: Secondary | ICD-10-CM | POA: Diagnosis not present

## 2016-04-05 DIAGNOSIS — E669 Obesity, unspecified: Secondary | ICD-10-CM | POA: Diagnosis not present

## 2016-04-05 DIAGNOSIS — J45909 Unspecified asthma, uncomplicated: Secondary | ICD-10-CM | POA: Diagnosis not present

## 2016-04-05 DIAGNOSIS — I10 Essential (primary) hypertension: Secondary | ICD-10-CM | POA: Diagnosis not present

## 2016-04-05 DIAGNOSIS — M170 Bilateral primary osteoarthritis of knee: Secondary | ICD-10-CM | POA: Diagnosis not present

## 2016-04-11 DIAGNOSIS — Z471 Aftercare following joint replacement surgery: Secondary | ICD-10-CM | POA: Diagnosis not present

## 2016-04-11 DIAGNOSIS — Z96611 Presence of right artificial shoulder joint: Secondary | ICD-10-CM | POA: Diagnosis not present

## 2016-04-12 ENCOUNTER — Other Ambulatory Visit: Payer: Self-pay | Admitting: Family Medicine

## 2016-04-13 ENCOUNTER — Other Ambulatory Visit: Payer: Self-pay | Admitting: Endocrinology

## 2016-04-18 ENCOUNTER — Other Ambulatory Visit: Payer: Self-pay | Admitting: Family Medicine

## 2016-04-25 ENCOUNTER — Encounter: Payer: Self-pay | Admitting: Family Medicine

## 2016-04-25 ENCOUNTER — Ambulatory Visit (INDEPENDENT_AMBULATORY_CARE_PROVIDER_SITE_OTHER): Payer: Medicare Other | Admitting: Family Medicine

## 2016-04-25 DIAGNOSIS — M8580 Other specified disorders of bone density and structure, unspecified site: Secondary | ICD-10-CM

## 2016-04-25 DIAGNOSIS — E7800 Pure hypercholesterolemia, unspecified: Secondary | ICD-10-CM | POA: Diagnosis not present

## 2016-04-25 DIAGNOSIS — Z23 Encounter for immunization: Secondary | ICD-10-CM | POA: Diagnosis not present

## 2016-04-25 DIAGNOSIS — Z780 Asymptomatic menopausal state: Secondary | ICD-10-CM | POA: Diagnosis not present

## 2016-04-25 DIAGNOSIS — I10 Essential (primary) hypertension: Secondary | ICD-10-CM | POA: Diagnosis not present

## 2016-04-25 DIAGNOSIS — F319 Bipolar disorder, unspecified: Secondary | ICD-10-CM | POA: Diagnosis not present

## 2016-04-25 LAB — LIPID PANEL
CHOL/HDL RATIO: 2.9 ratio (ref <=5.0)
Cholesterol: 147 mg/dL (ref 125–200)
HDL: 50 mg/dL (ref >=46)
LDL CALC: 77 mg/dL (ref <130)
TRIGLYCERIDES: 100 mg/dL (ref <150)
VLDL: 20 mg/dL (ref <30)

## 2016-04-25 MED ORDER — FLUOXETINE HCL 20 MG PO TABS: 20.0000 mg | ORAL_TABLET | Freq: Every day | ORAL | 3 refills | Status: DC

## 2016-04-25 NOTE — Patient Instructions (Addendum)
Please get a mammogram They should contact you about bone density. I sent in a prozac 20 mg prescription.   You will get a flu shot today.

## 2016-04-25 NOTE — Assessment & Plan Note (Signed)
Well controled. 

## 2016-04-25 NOTE — Assessment & Plan Note (Signed)
Recheck bone density.  

## 2016-04-25 NOTE — Assessment & Plan Note (Signed)
Osteopenia from 2013

## 2016-04-25 NOTE — Assessment & Plan Note (Signed)
Check cholesterol and CRP

## 2016-04-25 NOTE — Progress Notes (Signed)
   Subjective:    Patient ID: [name], female    DOB: 1930-12-25, 82 y.o.   MRN: [id number]  HPI  FU multiple issues. 1. S/P reconstructive surg to right shoulder.  Doing well.  States doing PT on her own.  Not using strong pain medications.   2. Bipolar is stable on current drug treatment.  She has been on prozac for years and wonders if she could come off.  Discussed importance of weaning SSRI.   3. HPDP needs flu and mammogram.  Had previous bone density 4 years ago and needs follow up.   4. Due for cholesterol check and wants CRP for CAD screen.   Review of Systems     Objective:   Physical Exam Able to abduct right shoulder to 90 degrees.   Lungs clear Cardiac RRR without m or g Abd benign Ext no edema.       Assessment & Plan:

## 2016-04-25 NOTE — Assessment & Plan Note (Signed)
Decrease prozac to 20 mg daily.

## 2016-04-26 ENCOUNTER — Other Ambulatory Visit: Payer: Self-pay | Admitting: Family Medicine

## 2016-04-26 DIAGNOSIS — I10 Essential (primary) hypertension: Secondary | ICD-10-CM

## 2016-04-26 LAB — C-REACTIVE PROTEIN: CRP: 11.8 mg/L — ABNORMAL HIGH (ref <8.0)

## 2016-05-02 DIAGNOSIS — Z471 Aftercare following joint replacement surgery: Secondary | ICD-10-CM | POA: Diagnosis not present

## 2016-05-02 DIAGNOSIS — M12811 Other specific arthropathies, not elsewhere classified, right shoulder: Secondary | ICD-10-CM | POA: Diagnosis not present

## 2016-05-08 ENCOUNTER — Ambulatory Visit: Payer: Self-pay | Admitting: Endocrinology

## 2016-05-08 DIAGNOSIS — L718 Other rosacea: Secondary | ICD-10-CM | POA: Diagnosis not present

## 2016-05-08 DIAGNOSIS — Z85828 Personal history of other malignant neoplasm of skin: Secondary | ICD-10-CM | POA: Diagnosis not present

## 2016-05-10 ENCOUNTER — Other Ambulatory Visit: Payer: Self-pay | Admitting: Family Medicine

## 2016-05-13 ENCOUNTER — Telehealth: Payer: Self-pay | Admitting: Family Medicine

## 2016-05-13 NOTE — Telephone Encounter (Signed)
Patient now has [facility]. Pt called to request a referral to her dermatologist, Dr. Mickel Baas Lomx at [facility]. When I went to [facility] website to enter referral, patient was not yet loaded to the system, Faxed a hard copy of referral and requested pt to be added online.

## 2016-05-15 ENCOUNTER — Other Ambulatory Visit: Payer: Self-pay | Admitting: Orthopedic Surgery

## 2016-05-15 DIAGNOSIS — Z96611 Presence of right artificial shoulder joint: Principal | ICD-10-CM

## 2016-05-15 DIAGNOSIS — Z471 Aftercare following joint replacement surgery: Secondary | ICD-10-CM

## 2016-05-17 DIAGNOSIS — D485 Neoplasm of uncertain behavior of skin: Secondary | ICD-10-CM | POA: Diagnosis not present

## 2016-05-17 DIAGNOSIS — L821 Other seborrheic keratosis: Secondary | ICD-10-CM | POA: Diagnosis not present

## 2016-05-17 DIAGNOSIS — D3611 Benign neoplasm of peripheral nerves and autonomic nervous system of face, head, and neck: Secondary | ICD-10-CM | POA: Diagnosis not present

## 2016-05-17 DIAGNOSIS — Z85828 Personal history of other malignant neoplasm of skin: Secondary | ICD-10-CM | POA: Diagnosis not present

## 2016-05-17 DIAGNOSIS — L820 Inflamed seborrheic keratosis: Secondary | ICD-10-CM | POA: Diagnosis not present

## 2016-05-17 DIAGNOSIS — L814 Other melanin hyperpigmentation: Secondary | ICD-10-CM | POA: Diagnosis not present

## 2016-05-17 DIAGNOSIS — D1801 Hemangioma of skin and subcutaneous tissue: Secondary | ICD-10-CM | POA: Diagnosis not present

## 2016-05-20 ENCOUNTER — Ambulatory Visit
Admission: RE | Admit: 2016-05-20 | Discharge: 2016-05-20 | Disposition: A | Discharge status: Home / Self Care | Payer: Commercial Managed Care - HMO | Source: Ambulatory Visit | Attending: Orthopedic Surgery | Admitting: Orthopedic Surgery

## 2016-05-20 DIAGNOSIS — Z471 Aftercare following joint replacement surgery: Secondary | ICD-10-CM

## 2016-05-20 DIAGNOSIS — Z96611 Presence of right artificial shoulder joint: Principal | ICD-10-CM

## 2016-05-20 DIAGNOSIS — S42121A Displaced fracture of acromial process, right shoulder, initial encounter for closed fracture: Secondary | ICD-10-CM | POA: Diagnosis not present

## 2016-05-29 DIAGNOSIS — M8589 Other specified disorders of bone density and structure, multiple sites: Secondary | ICD-10-CM | POA: Diagnosis not present

## 2016-05-29 DIAGNOSIS — Z1231 Encounter for screening mammogram for malignant neoplasm of breast: Secondary | ICD-10-CM | POA: Diagnosis not present

## 2016-05-29 DIAGNOSIS — M810 Age-related osteoporosis without current pathological fracture: Secondary | ICD-10-CM | POA: Diagnosis not present

## 2016-06-06 ENCOUNTER — Encounter: Payer: Self-pay | Admitting: Family Medicine

## 2016-06-18 ENCOUNTER — Other Ambulatory Visit: Payer: Self-pay | Admitting: Family Medicine

## 2016-06-18 MED ORDER — AMPHETAMINE-DEXTROAMPHETAMINE 10 MG PO TABS: 10.0000 mg | ORAL_TABLET | Freq: Every day | ORAL | 0 refills | Status: DC

## 2016-06-18 NOTE — Telephone Encounter (Signed)
Pt needs a refill on adderall, please advise. Thanks! ep

## 2016-06-18 NOTE — Telephone Encounter (Signed)
Done and LM for patient. 

## 2016-06-23 ENCOUNTER — Other Ambulatory Visit: Payer: Self-pay | Admitting: Family Medicine

## 2016-06-25 ENCOUNTER — Telehealth: Payer: Self-pay | Admitting: Family Medicine

## 2016-06-25 MED ORDER — HYDROCHLOROTHIAZIDE 12.5 MG PO TABS
12.5000 mg | ORAL_TABLET | Freq: Every day | ORAL | 3 refills | Status: DC
Start: 2016-06-25 — End: 2017-06-20

## 2016-06-25 NOTE — Telephone Encounter (Signed)
Needs refill on HCTZ.. She stopped taking metoprolol and went back to HCTZ. She felt her blood pressure responds better to HCTZ.  [facility] on [address].  She has an appt to see dr hensel dec 14.

## 2016-06-25 NOTE — Telephone Encounter (Signed)
Called and discussed.  HCTZ ordered and metoprolol removed from med list.

## 2016-07-09 ENCOUNTER — Telehealth: Payer: Self-pay | Admitting: Family Medicine

## 2016-07-09 MED ORDER — CLONIDINE HCL 0.1 MG/24HR TD PTWK: MEDICATED_PATCH | TRANSDERMAL | 3 refills | Status: DC

## 2016-07-09 NOTE — Telephone Encounter (Signed)
Refilled as requested.  Patient informed.

## 2016-07-09 NOTE — Telephone Encounter (Signed)
Needs refill on clo nadine patch refilled. Please give me as many refills as dr Andria Frames can.  [facility] on [address]

## 2016-07-14 ENCOUNTER — Other Ambulatory Visit: Payer: Self-pay | Admitting: Family Medicine

## 2016-07-25 ENCOUNTER — Encounter: Payer: Self-pay | Admitting: Family Medicine

## 2016-07-25 ENCOUNTER — Ambulatory Visit (INDEPENDENT_AMBULATORY_CARE_PROVIDER_SITE_OTHER): Payer: Medicare Other | Admitting: Family Medicine

## 2016-07-25 DIAGNOSIS — F908 Attention-deficit hyperactivity disorder, other type: Secondary | ICD-10-CM

## 2016-07-25 DIAGNOSIS — F319 Bipolar disorder, unspecified: Secondary | ICD-10-CM

## 2016-07-25 DIAGNOSIS — I10 Essential (primary) hypertension: Secondary | ICD-10-CM

## 2016-07-25 MED ORDER — AMPHETAMINE-DEXTROAMPHETAMINE 10 MG PO TABS: 10.0000 mg | ORAL_TABLET | Freq: Every day | ORAL | 0 refills | Status: DC

## 2016-07-25 NOTE — Assessment & Plan Note (Addendum)
crp repeat per patient request - she is worried about CAD risk factor. Good control on current meds.

## 2016-07-25 NOTE — Assessment & Plan Note (Signed)
Reorder adderall

## 2016-07-25 NOTE — Patient Instructions (Signed)
I will call with lab results You look great.  Congrats on losing 4 lbs. See me in the spring. Please make an appointment with my nurse for an annual medicare wellness exam.

## 2016-07-26 LAB — C-REACTIVE PROTEIN: CRP: 3.4 mg/L (ref <8.0)

## 2016-07-26 NOTE — Progress Notes (Signed)
   Subjective:    Patient ID: [name], female    DOB: 1931-01-03, 82 y.o.   MRN: [id number]  HPI Ms [name] is here for two simple things. 1. She needs a refill on her adderall.  Continues to do well.   2. Wants a recheck of her CRP.  Was high last time - and she was getting over a viral infection.   Other issues. Up to date on health maint. Self weaning on Prozac.  She hopes to be on less medications. Wants baclofen left on her list but only takes prn and that is rarely.    Review of Systems     Objective:   Physical Exam Spirits good.  High energy but no signs of maia. Lungs clear Cardiac RRR without m or g         Assessment & Plan:

## 2016-08-02 ENCOUNTER — Inpatient Hospital Stay: Admit: 2016-08-02 | Payer: Commercial Managed Care - HMO | Admitting: Orthopedic Surgery

## 2016-08-02 SURGERY — OPEN REDUCTION INTERNAL FIXATION (ORIF) SHOULDER FRACTURE
Anesthesia: General | Laterality: Right

## 2016-08-20 ENCOUNTER — Ambulatory Visit: Payer: Self-pay | Admitting: *Deleted

## 2016-09-20 DIAGNOSIS — H26492 Other secondary cataract, left eye: Secondary | ICD-10-CM | POA: Diagnosis not present

## 2016-09-20 DIAGNOSIS — Z961 Presence of intraocular lens: Secondary | ICD-10-CM | POA: Diagnosis not present

## 2016-09-20 DIAGNOSIS — H40013 Open angle with borderline findings, low risk, bilateral: Secondary | ICD-10-CM | POA: Diagnosis not present

## 2016-10-20 ENCOUNTER — Other Ambulatory Visit: Payer: Self-pay | Admitting: Family Medicine

## 2016-11-15 DIAGNOSIS — H40013 Open angle with borderline findings, low risk, bilateral: Secondary | ICD-10-CM | POA: Diagnosis not present

## 2016-11-29 ENCOUNTER — Other Ambulatory Visit: Payer: Self-pay | Admitting: Family Medicine

## 2016-11-29 DIAGNOSIS — M5010 Cervical disc disorder with radiculopathy, unspecified cervical region: Secondary | ICD-10-CM

## 2016-12-12 ENCOUNTER — Encounter: Payer: Self-pay | Admitting: Family Medicine

## 2016-12-12 ENCOUNTER — Ambulatory Visit (INDEPENDENT_AMBULATORY_CARE_PROVIDER_SITE_OTHER): Payer: Medicare Other | Admitting: Family Medicine

## 2016-12-12 DIAGNOSIS — F319 Bipolar disorder, unspecified: Secondary | ICD-10-CM

## 2016-12-12 DIAGNOSIS — F908 Attention-deficit hyperactivity disorder, other type: Secondary | ICD-10-CM

## 2016-12-12 DIAGNOSIS — G609 Hereditary and idiopathic neuropathy, unspecified: Secondary | ICD-10-CM | POA: Diagnosis not present

## 2016-12-12 DIAGNOSIS — K5732 Diverticulitis of large intestine without perforation or abscess without bleeding: Secondary | ICD-10-CM

## 2016-12-12 DIAGNOSIS — I10 Essential (primary) hypertension: Secondary | ICD-10-CM | POA: Diagnosis not present

## 2016-12-12 MED ORDER — GABAPENTIN 300 MG PO CAPS
300.0000 mg | ORAL_CAPSULE | Freq: Two times a day (BID) | ORAL | 3 refills | Status: DC
Start: 2016-12-12 — End: 2016-12-13

## 2016-12-12 MED ORDER — AMPHETAMINE-DEXTROAMPHETAMINE 10 MG PO TABS: 10.0000 mg | ORAL_TABLET | Freq: Every day | ORAL | 0 refills | Status: DC

## 2016-12-12 MED ORDER — CLONIDINE HCL 0.2 MG/24HR TD PTWK: 0.2000 mg | MEDICATED_PATCH | TRANSDERMAL | 12 refills | Status: DC

## 2016-12-12 NOTE — Patient Instructions (Signed)
Give your peanuts to [facility] on the HCTZ.  I increased your patch to the 0.2 mg dose. Check your blood pressure.  If good, see me in three months.  If high see me sooner. I will give you the depression pills. I sent a new prescription for the higher dose of the gabapentin. Remember our deal.  No killing yourself without calling me - even if I'm on vacation.

## 2016-12-13 ENCOUNTER — Ambulatory Visit (INDEPENDENT_AMBULATORY_CARE_PROVIDER_SITE_OTHER): Payer: Medicare Other | Admitting: Podiatry

## 2016-12-13 ENCOUNTER — Encounter: Payer: Self-pay | Admitting: Podiatry

## 2016-12-13 DIAGNOSIS — L84 Corns and callosities: Secondary | ICD-10-CM

## 2016-12-13 DIAGNOSIS — M2042 Other hammer toe(s) (acquired), left foot: Secondary | ICD-10-CM | POA: Diagnosis not present

## 2016-12-13 DIAGNOSIS — M2041 Other hammer toe(s) (acquired), right foot: Secondary | ICD-10-CM

## 2016-12-13 NOTE — Assessment & Plan Note (Signed)
Spell that has since resolved.  No more peanuts.

## 2016-12-13 NOTE — Assessment & Plan Note (Signed)
Refill adderall

## 2016-12-13 NOTE — Assessment & Plan Note (Signed)
Increase gabapentin  

## 2016-12-13 NOTE — Assessment & Plan Note (Signed)
Stay on same meds.  Emphasized compliance.

## 2016-12-13 NOTE — Progress Notes (Signed)
   Subjective:    Patient ID: [name], female    DOB: Feb 21, 1931, 83 y.o.   MRN: [id number]  HPI  [name] comes in today with her neighbor/friend, [name].  She is a bit scattered with her bipolar, a little hypomanic today.  Issues: 1. Painless left leg swelling below the knee.  Asymtomatic 2. Neuropathy not well controled on current dose of gabapentin.  Wants increase. 3. Home BP has been running high.  Denies CP or SOB. 4. Had a bout of diverticulitis after eating peanuts.  Educated about diet and peanuts. 5. She has passing thoughts of suicide.  Not fixed and has no plan  My only concern is that she has a gun in the house and ammunition.  The gun is not loaded and she does not know where the ammunition is.  She is unwilling to give up either.  She does contract for safety.  [name] believes her depressive spells correlate with her missing medication dosages.  Wants refill on her aderal.     Review of Systems     Objective:   Physical ExamVS noted with elevated BP Lungs clear Cardiac, RRR without m or g Abd benign. Left leg soft tissue swelling laterally just distal to knee.  Could be swollen bursa but more likely a lipoma.         Assessment & Plan:

## 2016-12-13 NOTE — Assessment & Plan Note (Signed)
Increase clonidine.

## 2016-12-19 NOTE — Progress Notes (Signed)
Subjective:    Patient ID: [name], female   DOB: 83 y.o.   MRN: [id number]   HPI 83 year old female presents the office today for concerns of curling of her toes which she believes makes her unsteady. She states that she has had no recent treatment for this and she denies any recent injury or trauma. She does have neuropathy/neurological issues and she is currently on gabapentin. She has a states that she has a "knot" on her left leg near her knee. She states she has an upcoming appointment to get this checked. Her pain concern today are the curling of her toes which cause pain and calluses.    Review of Systems  All other systems reviewed and are negative.       Objective:  Physical Exam General: AAO x3, NAD  Dermatological: Small hyperkeratotic lesions of the distal aspect left second third digits as well as the right lateral fifth toe. No underlying ulceration, drainage or any clinical signs of infection. Is no other open lesions or pre-ulcerative lesions.  Vascular: Dorsalis Pedis artery and Posterior Tibial artery pedal pulses are 2/4 bilateral with immedate capillary fill time.  There is no pain with calf compression, swelling, warmth, erythema.   Neruologic: Sensation decreased with Derrel Nip monofilament.  Musculoskeletal: Rigid hammertoe contractures are present lesser digits. There is no other areas of tenderness to bilateral lower extremities. Muscular strength 5/5 in all groups tested bilateral. Range of motion intact the ankle, subtalar joint. Unable to palpate a "Knot" in the left leg. She has an upcoming appointment to get this checked as well.   Gait: Unassisted, Nonantalgic.      Assessment:     83 year old female synthetic hammertoes results and hyperkeratotic lesions    Plan:     -Treatment options discussed including all alternatives, risks, and complications -Etiology of symptoms were discussed -Lesions were debrided without complications or  bleeding. -Dispensed offloading pads. -Discussed shoe gear changes. -Continue to follow up with neurology for her neurological deficits as well as neuropathy. She is also getting her left leg check and recommended keep a follow-up for this.  Celesta Gentile, DPM

## 2017-01-02 ENCOUNTER — Telehealth: Payer: Self-pay | Admitting: Family Medicine

## 2017-01-02 NOTE — Telephone Encounter (Signed)
No answer. Left message asking if she would like to schedule AWV. Left number to discuss what it entails. - Mesha Guinyard

## 2017-01-23 DIAGNOSIS — L820 Inflamed seborrheic keratosis: Secondary | ICD-10-CM | POA: Diagnosis not present

## 2017-01-23 DIAGNOSIS — Z85828 Personal history of other malignant neoplasm of skin: Secondary | ICD-10-CM | POA: Diagnosis not present

## 2017-01-23 DIAGNOSIS — L821 Other seborrheic keratosis: Secondary | ICD-10-CM | POA: Diagnosis not present

## 2017-01-23 DIAGNOSIS — L718 Other rosacea: Secondary | ICD-10-CM | POA: Diagnosis not present

## 2017-01-29 ENCOUNTER — Telehealth: Payer: Self-pay | Admitting: Family Medicine

## 2017-01-29 DIAGNOSIS — K5732 Diverticulitis of large intestine without perforation or abscess without bleeding: Secondary | ICD-10-CM

## 2017-01-29 MED ORDER — CIPROFLOXACIN HCL 500 MG PO TABS
500.0000 mg | ORAL_TABLET | Freq: Two times a day (BID) | ORAL | 0 refills | Status: DC
Start: 2017-01-29 — End: 2017-08-13

## 2017-01-29 MED ORDER — METRONIDAZOLE 500 MG PO TABS: 500.0000 mg | ORAL_TABLET | Freq: Two times a day (BID) | ORAL | 0 refills | Status: DC

## 2017-01-29 NOTE — Telephone Encounter (Signed)
Pt called because when she had her colonoscopy and that physician said that she had diverticulitis. Pt would like something called in for this. She also wanted Dr. Andria Frames to know that she just came back from a 3 day bout of depression. She didn't take any happy pills or speak to anyone, but she does feel better today. If you have any questions please call her. jw

## 2017-01-29 NOTE — Telephone Encounter (Signed)
Recovering from a three day bout of depression.  No SI or HI.  No change in treatment.  Left lower quadrent pain consistent with previous bouts of diverticulities.  Wil Rx empirically.

## 2017-02-10 ENCOUNTER — Telehealth: Payer: Self-pay | Admitting: Family Medicine

## 2017-02-10 DIAGNOSIS — K921 Melena: Secondary | ICD-10-CM | POA: Insufficient documentation

## 2017-02-10 NOTE — Telephone Encounter (Signed)
Refuses appointment.  If hemocult pos, she will see her GI doctor.  FIt testing ordered.

## 2017-02-10 NOTE — Telephone Encounter (Signed)
Pt states her stools are black and does not want to schedule an appointment. Pt would like to come tomorrow to pick up a take home stool kit. Please call pt and let her know if this is possible. Thanks! ep

## 2017-02-17 ENCOUNTER — Telehealth: Payer: Self-pay | Admitting: Family Medicine

## 2017-02-17 DIAGNOSIS — K921 Melena: Secondary | ICD-10-CM

## 2017-02-17 NOTE — Telephone Encounter (Signed)
Please call dr Watt Climes and tell him pt will do a colonscopy but she will not schedule a visit before the colonscopy because she doesn't want to waste taxpayers money. Please advise

## 2017-02-18 NOTE — Telephone Encounter (Signed)
Called patient.  She is still having black stools.  Has not done the hemocult.  "There is no need.  Everybody knows black stools mean blood."  She had called GI office (Dr. Sarina Ser.)  She is upset that he wanted to see her before doing another colonoscopy.    She refuses.  "I will NOT waste taxpayer's money."    She has requested a copy of her last colonoscopy.  She will make an appointment to see me.  Also ordered a CBC and she will get the blood drawn in the next few days.

## 2017-02-19 ENCOUNTER — Telehealth: Payer: Self-pay | Admitting: *Deleted

## 2017-02-19 ENCOUNTER — Other Ambulatory Visit: Payer: Medicare Other

## 2017-02-19 DIAGNOSIS — K921 Melena: Secondary | ICD-10-CM | POA: Diagnosis not present

## 2017-02-19 DIAGNOSIS — H26492 Other secondary cataract, left eye: Secondary | ICD-10-CM | POA: Diagnosis not present

## 2017-02-19 DIAGNOSIS — Z9841 Cataract extraction status, right eye: Secondary | ICD-10-CM | POA: Diagnosis not present

## 2017-02-19 DIAGNOSIS — Z961 Presence of intraocular lens: Secondary | ICD-10-CM | POA: Diagnosis not present

## 2017-02-19 NOTE — Telephone Encounter (Signed)
Pt came in and dropped off some medical records that she would like to be put in her chart.  Prepared sheets to be scanned and placed in PCP box for review before scanning. Katharina Caper, Kalvin Buss D, [place]

## 2017-02-20 LAB — CBC
HEMATOCRIT: 39.8 % (ref 34.0–46.6)
Hemoglobin: 13.3 g/dL (ref 11.1–15.9)
MCH: 29.4 pg (ref 26.6–33.0)
MCHC: 33.4 g/dL (ref 31.5–35.7)
MCV: 88 fL (ref 79–97)
Platelets: 303 10*3/uL (ref 150–379)
RBC: 4.53 x10E6/uL (ref 3.77–5.28)
RDW: 14.3 % (ref 12.3–15.4)
WBC: 10.1 10*3/uL (ref 3.4–10.8)

## 2017-02-20 NOTE — Telephone Encounter (Signed)
Brought in last colonoscopy reports.  Will scan.

## 2017-03-10 ENCOUNTER — Encounter: Payer: Self-pay | Admitting: Family Medicine

## 2017-03-11 ENCOUNTER — Other Ambulatory Visit: Payer: Self-pay | Admitting: Family Medicine

## 2017-03-11 DIAGNOSIS — K5732 Diverticulitis of large intestine without perforation or abscess without bleeding: Secondary | ICD-10-CM

## 2017-03-12 DIAGNOSIS — Z86010 Personal history of colonic polyps: Secondary | ICD-10-CM | POA: Diagnosis not present

## 2017-03-12 DIAGNOSIS — R1032 Left lower quadrant pain: Secondary | ICD-10-CM | POA: Diagnosis not present

## 2017-03-12 DIAGNOSIS — K5730 Diverticulosis of large intestine without perforation or abscess without bleeding: Secondary | ICD-10-CM | POA: Diagnosis not present

## 2017-04-04 ENCOUNTER — Telehealth: Payer: Self-pay | Admitting: Family Medicine

## 2017-04-04 NOTE — Telephone Encounter (Signed)
Denied.  Not seen since May.  As patient and I have previously discussed (and patient has resisted saying it is a waste of taxpayer money) she needs a face to face appointment for refill on this controled substance.  LM on voicemail that she needs an appointment.

## 2017-04-04 NOTE — Telephone Encounter (Signed)
Pt called and needs a refill on her Adderall left up front for pick up. Please call when ready to pick up jw

## 2017-04-06 IMAGING — CT CT SHOULDER*R* W/O CM
3 of 5 series · 9 of 20 positions shown, 10 images · non-contrast
Comparison: Radiograph dated 03/29/2016

CLINICAL DATA: Right shoulder pain and limited range of motion for
2 weeks. Reverse total shoulder replacement 6 weeks ago.

EXAM:
CT OF THE RIGHT SHOULDER WITHOUT CONTRAST
TECHNIQUE: Multidetector CT imaging was performed according to the standard
protocol. Multiplanar CT image reconstructions were also generated.

[Series 4: thin bones (person_name) · axial · 0.54mm/px · z∈[+262,+391]mm · 4 of 361 slices shown, 5 images]
[im 73/361  soft-tissue]
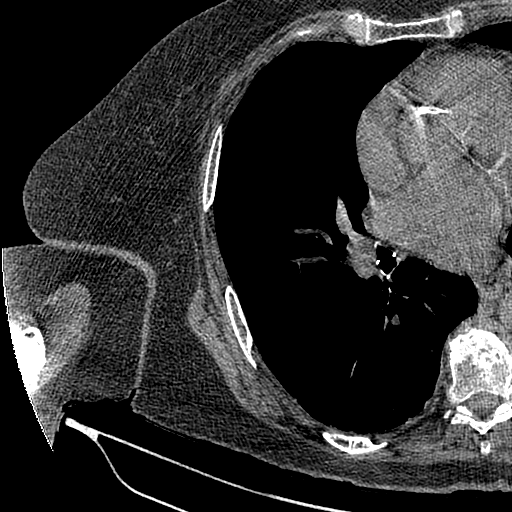
[im 73/361  bone]
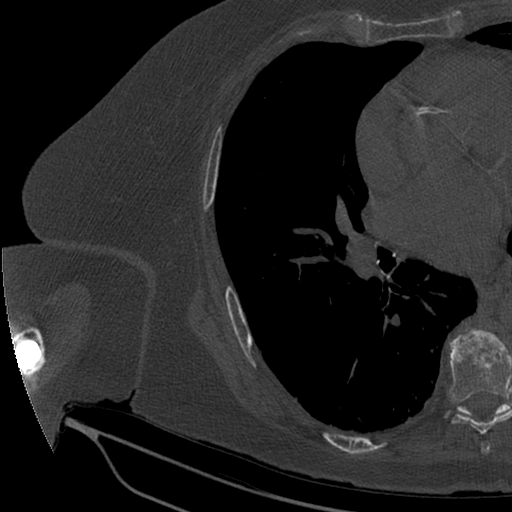
[im 145/361  bone]
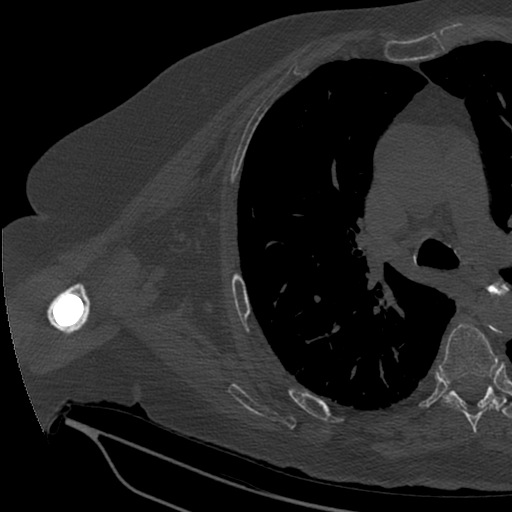
[im 217/361  bone]
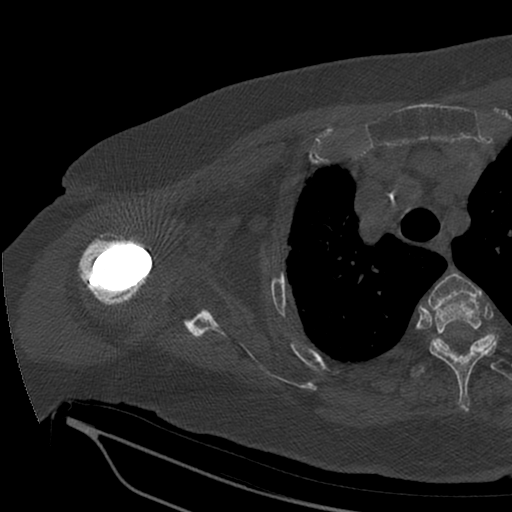
[im 289/361  bone]
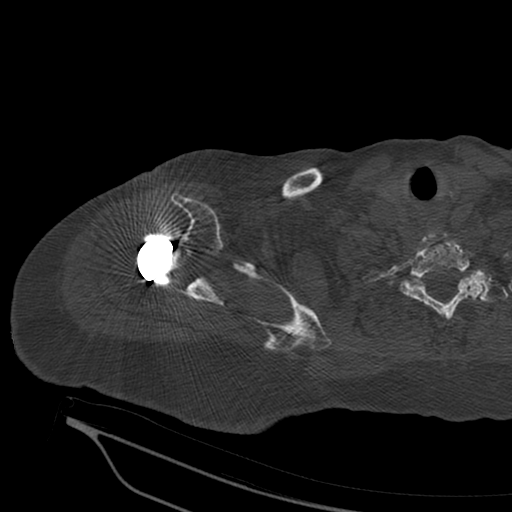

[Series 5: thin soft (person_name) · axial · 0.54mm/px · z∈[+262,+391]mm · 4 of 361 slices shown]
[im 73/361  soft-tissue]
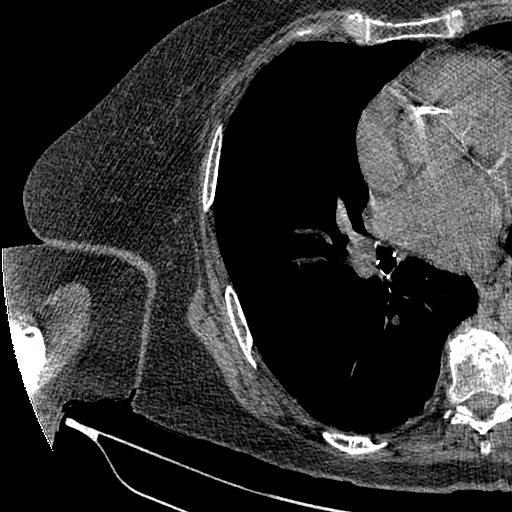
[im 145/361  soft-tissue]
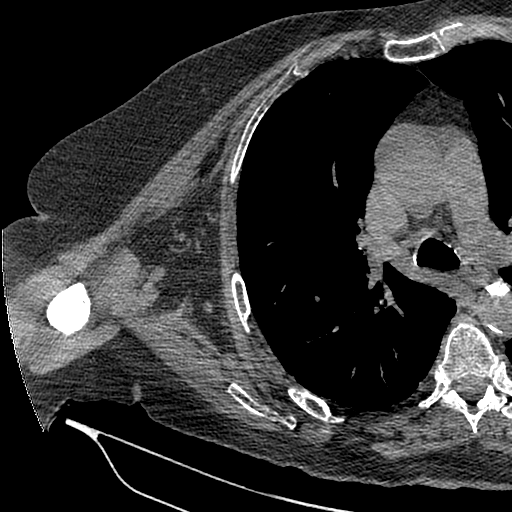
[im 217/361  soft-tissue]
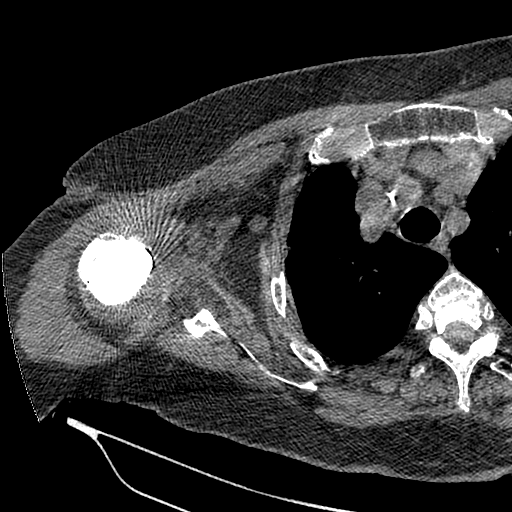
[im 289/361  soft-tissue]
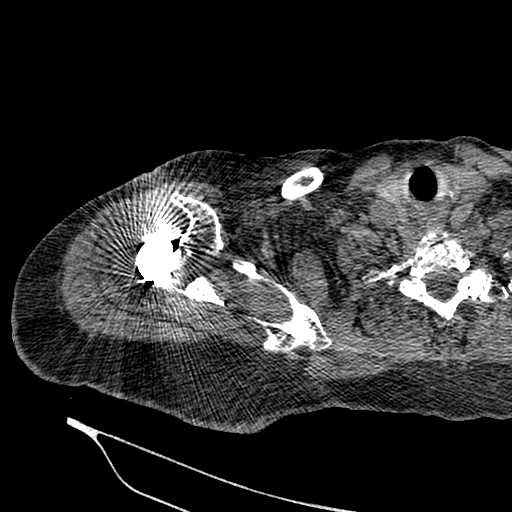

[Series 6: cor bone · coronal · 0.43mm/px · 1 of 144 slices shown]
[im 72/144  bone]
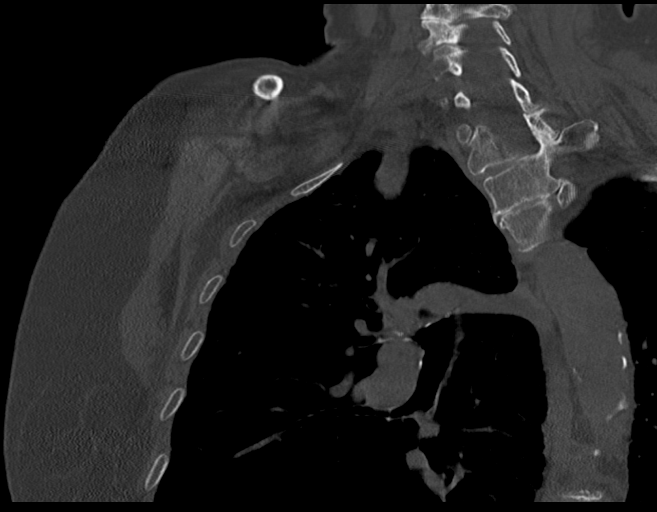

[9 of 20 positions shown; findings below may reference images not displayed]

FINDINGS: There is a fracture the acromion with minimal displacement and
slight callus formation along the inferior aspect of the fracture.
There is a ring-like density in this callus formation best seen on
image 14 of series 3. I do not think this represents a foreign body
but that it represents an unusual appearance of callus formation.

The components of reverse total shoulder prosthesis are in excellent
position with no evidence of loosening. No fracture of the humerus
or of the body of the scapula. Slight degenerative changes of AC
joint.

10 mm calcified loose body in the axillary recess.

The operative report describes complete tears of the infraspinatus
and supraspinous tendons with severe degenerative changes of the
subscapularis with subsequent release of the subscapularis
intraoperatively.
IMPRESSION: 1. Minimally displaced fracture of the acromion with slight callus
formation along the inferior aspect of the fracture.
2. Small glenohumeral joint effusion. 10 mm loose body in the
axillary recess.

## 2017-04-09 NOTE — Telephone Encounter (Signed)
Pt called about her prescription for adderal. She doesn't feel she needs to come in because there is nothing dr Andria Frames can do and its a waste of taxpayers money.  Pts speech was slow and slurred.

## 2017-04-09 NOTE — Telephone Encounter (Signed)
Will forward to MD.  Patient still is declining to come in. [facility]

## 2017-04-09 NOTE — Telephone Encounter (Signed)
Rx for adderall still denied.  I will be happy to fill when we have an office visit.

## 2017-04-18 DIAGNOSIS — K5730 Diverticulosis of large intestine without perforation or abscess without bleeding: Secondary | ICD-10-CM | POA: Diagnosis not present

## 2017-04-18 DIAGNOSIS — K648 Other hemorrhoids: Secondary | ICD-10-CM | POA: Diagnosis not present

## 2017-04-18 DIAGNOSIS — Z86010 Personal history of colonic polyps: Secondary | ICD-10-CM | POA: Diagnosis not present

## 2017-05-22 ENCOUNTER — Ambulatory Visit: Payer: Self-pay

## 2017-06-11 DIAGNOSIS — Z23 Encounter for immunization: Secondary | ICD-10-CM | POA: Diagnosis not present

## 2017-06-20 ENCOUNTER — Other Ambulatory Visit: Payer: Self-pay | Admitting: Family Medicine

## 2017-07-15 ENCOUNTER — Encounter (HOSPITAL_COMMUNITY): Payer: Self-pay | Admitting: *Deleted

## 2017-07-15 ENCOUNTER — Other Ambulatory Visit: Payer: Self-pay

## 2017-07-15 ENCOUNTER — Observation Stay (HOSPITAL_COMMUNITY)
Admission: EM | Admit: 2017-07-15 | Discharge: 2017-07-16 | Disposition: A | Discharge status: Home / Self Care | Payer: Medicare Other | Attending: Family Medicine | Admitting: Family Medicine

## 2017-07-15 ENCOUNTER — Emergency Department (HOSPITAL_COMMUNITY): Payer: Medicare Other

## 2017-07-15 ENCOUNTER — Telehealth: Payer: Self-pay | Admitting: *Deleted

## 2017-07-15 DIAGNOSIS — S065X0A Traumatic subdural hemorrhage without loss of consciousness, initial encounter: Principal | ICD-10-CM | POA: Insufficient documentation

## 2017-07-15 DIAGNOSIS — Y92003 Bedroom of unspecified non-institutional (private) residence as the place of occurrence of the external cause: Secondary | ICD-10-CM | POA: Diagnosis not present

## 2017-07-15 DIAGNOSIS — S0993XA Unspecified injury of face, initial encounter: Secondary | ICD-10-CM | POA: Diagnosis not present

## 2017-07-15 DIAGNOSIS — F319 Bipolar disorder, unspecified: Secondary | ICD-10-CM | POA: Insufficient documentation

## 2017-07-15 DIAGNOSIS — S065X9A Traumatic subdural hemorrhage with loss of consciousness of unspecified duration, initial encounter: Secondary | ICD-10-CM | POA: Diagnosis present

## 2017-07-15 DIAGNOSIS — Z880 Allergy status to penicillin: Secondary | ICD-10-CM | POA: Insufficient documentation

## 2017-07-15 DIAGNOSIS — S01511A Laceration without foreign body of lip, initial encounter: Secondary | ICD-10-CM

## 2017-07-15 DIAGNOSIS — I10 Essential (primary) hypertension: Secondary | ICD-10-CM | POA: Diagnosis present

## 2017-07-15 DIAGNOSIS — R45851 Suicidal ideations: Secondary | ICD-10-CM | POA: Insufficient documentation

## 2017-07-15 DIAGNOSIS — W01198A Fall on same level from slipping, tripping and stumbling with subsequent striking against other object, initial encounter: Secondary | ICD-10-CM | POA: Diagnosis not present

## 2017-07-15 DIAGNOSIS — F339 Major depressive disorder, recurrent, unspecified: Secondary | ICD-10-CM | POA: Diagnosis present

## 2017-07-15 DIAGNOSIS — E785 Hyperlipidemia, unspecified: Secondary | ICD-10-CM | POA: Diagnosis not present

## 2017-07-15 DIAGNOSIS — I700 Atherosclerosis of aorta: Secondary | ICD-10-CM | POA: Insufficient documentation

## 2017-07-15 DIAGNOSIS — S065XAA Traumatic subdural hemorrhage with loss of consciousness status unknown, initial encounter: Secondary | ICD-10-CM | POA: Diagnosis present

## 2017-07-15 DIAGNOSIS — W19XXXA Unspecified fall, initial encounter: Secondary | ICD-10-CM

## 2017-07-15 DIAGNOSIS — S299XXA Unspecified injury of thorax, initial encounter: Secondary | ICD-10-CM | POA: Diagnosis not present

## 2017-07-15 DIAGNOSIS — Z79899 Other long term (current) drug therapy: Secondary | ICD-10-CM | POA: Diagnosis not present

## 2017-07-15 DIAGNOSIS — Z86011 Personal history of benign neoplasm of the brain: Secondary | ICD-10-CM | POA: Diagnosis not present

## 2017-07-15 DIAGNOSIS — R0781 Pleurodynia: Secondary | ICD-10-CM | POA: Diagnosis not present

## 2017-07-15 DIAGNOSIS — R2681 Unsteadiness on feet: Secondary | ICD-10-CM | POA: Diagnosis not present

## 2017-07-15 LAB — COMPREHENSIVE METABOLIC PANEL
ALBUMIN: 3.8 g/dL (ref 3.5–5.0)
ALK PHOS: 93 U/L (ref 38–126)
ALT: 12 U/L — AB (ref 14–54)
AST: 36 U/L (ref 15–41)
Anion gap: 12 (ref 5–15)
BILIRUBIN TOTAL: 1.6 mg/dL — AB (ref 0.3–1.2)
BUN: 14 mg/dL (ref 6–20)
CALCIUM: 9.4 mg/dL (ref 8.9–10.3)
CO2: 23 mmol/L (ref 22–32)
Chloride: 102 mmol/L (ref 101–111)
Creatinine, Ser: 0.66 mg/dL (ref 0.44–1.00)
GFR calc Af Amer: >60 mL/min (ref >60)
GFR calc non Af Amer: >60 mL/min (ref >60)
GLUCOSE: 97 mg/dL (ref 65–99)
Potassium: 4 mmol/L (ref 3.5–5.1)
SODIUM: 137 mmol/L (ref 135–145)
TOTAL PROTEIN: 7.3 g/dL (ref 6.5–8.1)

## 2017-07-15 LAB — CBC WITH DIFFERENTIAL/PLATELET
BASOS ABS: 0 10*3/uL (ref 0.0–0.1)
BASOS PCT: 0 %
EOS PCT: 0 %
Eosinophils Absolute: 0 10*3/uL (ref 0.0–0.7)
HEMATOCRIT: 39.7 % (ref 36.0–46.0)
Hemoglobin: 13.2 g/dL (ref 12.0–15.0)
Lymphocytes Relative: 24 %
Lymphs Abs: 2.4 10*3/uL (ref 0.7–4.0)
MCH: 29.4 pg (ref 26.0–34.0)
MCHC: 33.2 g/dL (ref 30.0–36.0)
MCV: 88.4 fL (ref 78.0–100.0)
MONO ABS: 0.8 10*3/uL (ref 0.1–1.0)
MONOS PCT: 8 %
NEUTROS ABS: 6.8 10*3/uL (ref 1.7–7.7)
Neutrophils Relative %: 68 %
PLATELETS: 260 10*3/uL (ref 150–400)
RBC: 4.49 MIL/uL (ref 3.87–5.11)
RDW: 14.1 % (ref 11.5–15.5)
WBC: 10.1 10*3/uL (ref 4.0–10.5)

## 2017-07-15 LAB — PROTIME-INR
INR: 1.01
Prothrombin Time: 13.2 seconds (ref 11.4–15.2)

## 2017-07-15 LAB — TYPE AND SCREEN
ABO/RH(D): O POS
Antibody Screen: NEGATIVE

## 2017-07-15 MED ORDER — GABAPENTIN 300 MG PO CAPS
300.0000 mg | ORAL_CAPSULE | Freq: Two times a day (BID) | ORAL | Status: DC
  Administered 2017-07-15 – 2017-07-16 (×2): 300 mg via ORAL
  Filled 2017-07-15 (×2): qty 1

## 2017-07-15 MED ORDER — LIDOCAINE HCL (PF) 1 % IJ SOLN
2.0000 mL | Freq: Once | INTRAMUSCULAR | Status: AC
  Administered 2017-07-15: 2 mL
  Filled 2017-07-15: qty 5

## 2017-07-15 MED ORDER — ONDANSETRON HCL 4 MG PO TABS: 4.0000 mg | ORAL_TABLET | Freq: Four times a day (QID) | ORAL | Status: DC | PRN

## 2017-07-15 MED ORDER — HYDROCHLOROTHIAZIDE 12.5 MG PO CAPS
12.5000 mg | ORAL_CAPSULE | Freq: Every day | ORAL | Status: DC
  Administered 2017-07-16: 12.5 mg via ORAL
  Filled 2017-07-15: qty 1

## 2017-07-15 MED ORDER — ACETAMINOPHEN 650 MG RE SUPP: 650.0000 mg | Freq: Four times a day (QID) | RECTAL | Status: DC | PRN

## 2017-07-15 MED ORDER — ONDANSETRON HCL 4 MG/2ML IJ SOLN: 4.0000 mg | Freq: Four times a day (QID) | INTRAMUSCULAR | Status: DC | PRN

## 2017-07-15 MED ORDER — ACETAMINOPHEN 325 MG PO TABS
650.0000 mg | ORAL_TABLET | Freq: Four times a day (QID) | ORAL | Status: DC | PRN
  Administered 2017-07-15: 650 mg via ORAL
  Filled 2017-07-15: qty 2

## 2017-07-15 MED ORDER — ATORVASTATIN CALCIUM 10 MG PO TABS: 10.0000 mg | ORAL_TABLET | Freq: Every day | ORAL | Status: DC

## 2017-07-15 MED ORDER — FLUOXETINE HCL 10 MG PO CAPS: 40.0000 mg | ORAL_CAPSULE | Freq: Every day | ORAL | Status: DC

## 2017-07-15 NOTE — ED Notes (Signed)
Meal tray delivered.

## 2017-07-15 NOTE — Progress Notes (Signed)
82 year old status post remote right-sided craniotomy for meningioma with subsequent cranioplasty.  Patient suffered a mechanical fall.  Patient with some mild headache otherwise no neurologic symptoms.  Head CT scan demonstrates evidence of a small convexity subdural hematoma with a larger component extending along the right tentorium.  No significant mass-effect.  Small nonoperative subdural hematoma.  Recommend medicine admission for observation.  If no anticoagulation whatsoever.  I will follow.

## 2017-07-15 NOTE — Telephone Encounter (Signed)
Pt called nurse line to let him know she was in the Emergency Room.  Jyoti Harju, Salome Spotted, CMA

## 2017-07-15 NOTE — ED Notes (Signed)
ED Provider at bedside. 

## 2017-07-15 NOTE — H&P (Addendum)
[facility] Admission History and Physical Service Pager: [phone]  Patient name: [name] Medical record number: [id number] Date of birth: 04/07/1931 Age: 83 y.o. Gender: female  Primary Care Provider: [name], MD Consultants: Neurosurgery Code Status: Full code (obtained on admission)  Chief Complaint: Fall  Assessment and Plan: [name] is a 83 y.o. female presenting with fall at home. PMH is significant for bipolar 1 disorder, hypertension, osteoarthritis, ADD, and NSTEMI, meningioma status post resection in 1980, AVM  Fall with subsequent subdural hematoma: Neuro exam within normal limits.  Head CT shows evidence of some subdural hematoma with larger component about 15 mm teak along the right tentorium but no significant mass-effect.  No indication for surgery per NS.  Fall likely mechanical.  Patient reports gait issue at baseline which could be a likely contributor to this. Other possibilities include seizure, vasovagal and iatrogenic.  She has no significant cardiac history to think of arrhythmia but could be a possibility. She has remote history of seizure that has resolved with resection of meningioma in 1980s.  She had some bowel and bladder accidents after fall but she fell in the bathroom.  it could also be vasovagal given the temporal association with the bathroom use. She is also on gabapentin 300 mg twice daily which could contribute but unlikely.   -Admit to MedSurg, neuro bed.  Attending Dr. Mingo Amber -Neuro check every 4 hours -Cardiac monitoring -Avoid blood thinner and NSAID -We will touch base with neurosurgery about the need for repeat imaging -Consider EEG.  -Consider 30-day event monitor -PT/OT consult  Bipolar I disorder/depression: History of this in her chart.  She is only on Prozac.  She is irritable and angry concerning for pneumonia. -We will hold her Prozac.  Hypertension: Mildly hypertensive -Continue home  hydrochlorothiazide -We will continue monitoring   Hyperlipidemia: on atorvastatin at home. Not sure about benefit given her age -Continue home atorvastatin  Chronic pain: On ibuprofen and gabapentin at home. -Hold ibuprofen in the setting of intracranial bleeding -Continue gabapentin. -Tylenol PRN  FEN/GI: -Regular diet  Prophylaxis: -SCD given intracranial bleeding  Disposition: Admit to MedSurg for evaluation and management of intracranial bleeding.  History of Present Illness:  [name] is a 83 y.o. female presenting with fall at home.  Patient reports that she was out for bathroom use about 4 AM last night.  She lost her balance and fell with face forward.  Not sure where she hit her face on.  The next thing she remembers was she was covered with blood from her face and urine and feces.  She is not sure how long she has been done.  She says she has been off balance for the last 5 years.  She denies vertigo or lightheadedness.  She denies any prodrome leading to this.  She was able to pull himself to the bedroom later on.  She was able to open the door for her dog later that morning.  She says she tried to call clinic and the phone was busy and she was not able to talk to anyone.  She says she sent her friend to the clinic to talk to Dr. Andria Frames.  Unfortunately Dr. Andria Frames was not in the clinic and her friend has to bring her to emergency department. Patient reports mild frontal headache.  She denies vision change, focal numbness, weakness or tingling.  She denies nausea or vomiting.  Denies fever, chills, shortness of breath, chest pain, abdominal pain or dysuria.  She denies smoking cigarettes, drinking alcohol or recreational drug use.  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  She says she lives by herself.  ED course: Vital signs within normal limits.  CBC, CMP, PT/INR and CBG with in normal limit.  CT head showed right hemispheric subdural hematoma, subfrontal subdural hematoma on the right  measures 5 mm, right temporal subdural hematoma measures 6 mm, focal tentorial subdural hematoma on the right measures 15 mm in thickness and posterior interhemispheric subdural hematoma measures approximately 4 mm. Negative for subarachnoid hemorrhage. No midline shift.  Neurosurgery was consulted in ED and recommended admission for observation and medical managment.  so family medicine was called to admit patient  Review Of Systems:  Review of Systems  Constitutional: Negative for fever and weight loss.  HENT: Negative for sore throat.   Eyes: Negative for blurred vision, photophobia and pain.  Respiratory: Negative for cough and shortness of breath.   Cardiovascular: Negative for chest pain, palpitations and leg swelling.  Gastrointestinal: Negative for abdominal pain, blood in stool, diarrhea, melena, nausea and vomiting.  Genitourinary: Negative for dysuria.  Musculoskeletal: Positive for falls and joint pain. Negative for myalgias.  Skin: Negative for rash.  Neurological: Positive for headaches. Negative for dizziness, sensory change, speech change, focal weakness and weakness.  Endo/Heme/Allergies: Does not bruise/bleed easily.  Psychiatric/Behavioral: Negative for depression and substance abuse. The patient is not nervous/anxious.    Patient Active Problem List   Diagnosis Date Noted  . Subdural hematoma ([place]) 07/15/2017  . Black stool 02/10/2017  . Post-menopausal 04/25/2016  . S/P shoulder replacement 03/29/2016  . Diverticulitis of colon without hemorrhage 12/04/2015  . Rotator cuff syndrome of left shoulder 10/26/2015  . Adhesive capsulitis of right shoulder 10/26/2015  . Cervical disc disorder with radiculopathy of cervical region 10/26/2015  . Excessive sweating, local 10/03/2015  . Heart murmur, systolic 37/90/2409  . Rosacea 09/07/2015  . Adenomatous colon polyp 04/12/2014  . Hearing loss in left ear 04/13/2013  . Obesity 09/12/2012  . NSTEMI (non-ST elevated  myocardial infarction) ([place]) 03/25/2012  . Osteopenia 11/13/2011  . ADD (attention deficit disorder) 02/22/2011  . Pure hypercholesterolemia 10/09/2006  . Bipolar I disorder ([place]) 10/09/2006  . Hereditary and idiopathic peripheral neuropathy 10/09/2006  . HYPERTENSION, BENIGN SYSTEMIC 10/09/2006  . OSTEOARTHRITIS, MULTI SITES 10/09/2006  . CONVULSIONS, SEIZURES, NOS 10/09/2006    Past Medical History: Past Medical History:  Diagnosis Date  . Anemia   . Asthma    seasonal    . AVM (arteriovenous malformation)   . Bipolar disorder ([place])   . Cancer (HCC)     right leg  skin  . Depression    sucide attempt in the distant past  . Excessive sweating   . GI bleed   . Hiatal hernia    s/p repair  . HLD (hyperlipidemia)   . HTN (hypertension)   . Hypertension   . Low back pain    ESI by Dr. Nelva Bush 2 weeks ago  . OA (osteoarthritis)    bilateral wrist, bilateral knees  . Obesity   . Obesity   . Ovarian cyst   . Right wrist injury    multiple surgeries and residual weakness.   . Rotator cuff arthropathy    right  . Seizures ([place]) 1980s   2/2 meningioma. none since cranioplasty  . Shortness of breath dyspnea    if gets excited  . Tuberculosis    + tb test      (father had)  Past Surgical History: Past Surgical History:  Procedure Laterality Date  . ABDOMINAL HYSTERECTOMY     left ovaries  . BREAST REDUCTION SURGERY    . CATARACT EXTRACTION Bilateral   . CRANIOPLASTY     2/2 meningioma 1980s  . CYSTECTOMY     rt ovary  age 40   . HIATAL HERNIA REPAIR    . KNEE ARTHROPLASTY     bilat  . LEFT HEART CATHETERIZATION WITH CORONARY ANGIOGRAM N/A 03/26/2012   Procedure: LEFT HEART CATHETERIZATION WITH CORONARY ANGIOGRAM;  Surgeon: Minus Breeding, MD;  Location: [facility] CATH LAB;  Service: Cardiovascular;  Laterality: N/A;  . REVERSE SHOULDER ARTHROPLASTY Right 03/29/2016  . REVERSE SHOULDER ARTHROPLASTY Right 03/29/2016   Procedure: RIGHT REVERSE SHOULDER ARTHROPLASTY;   Surgeon: Netta Cedars, MD;  Location: [place] [facility];  Service: Orthopedics;  Laterality: Right;    Social History: Social History   Tobacco Use  . Smoking status: Never Smoker  . Smokeless tobacco: Never Used  Substance Use Topics  . Alcohol use: No  . Drug use: No   Additional social history  Please also refer to relevant sections of EMR.  Family History: Family History  Problem Relation Age of Onset  . Alzheimer's disease Mother   . Heart disease Father        MI 22yo  . Heart disease Brother    (If not completed, MUST add something in)  Allergies and Medications: Allergies  Allergen Reactions  . Amoxicillin Anaphylaxis  . Penicillins Anaphylaxis and Swelling    From childhood: Has patient had a PCN reaction causing immediate rash, facial/tongue/throat swelling, SOB or lightheadedness with hypotension: Yes Has patient had a PCN reaction causing severe rash involving mucus membranes or skin necrosis: Unk Has patient had a PCN reaction that required hospitalization: No Has patient had a PCN reaction occurring within the last 10 years: No If all of the above answers are "NO", then may proceed with Cephalosporin use. .  . Phenobarbital Other (See Comments)    Reaction not recalled by the patient  . Phenytoin Other (See Comments)    "Makes me feel funny"   . Tape Rash    Please use paper tape   No current facility-administered medications on file prior to encounter.    Current Outpatient Medications on File Prior to Encounter  Medication Sig Dispense Refill  . atorvastatin (LIPITOR) 10 MG tablet TAKE 1 TABLET(10 MG) BY MOUTH AT BEDTIME (Patient taking differently: Take 10 mg by mouth at bedtime) 90 tablet 3  . Coenzyme Q10 (COQ10) 200 MG CAPS Take 200 mg by mouth daily.     Marland Kitchen FLUoxetine (PROZAC) 40 MG capsule Take 40 mg by mouth daily.   1  . gabapentin (NEURONTIN) 300 MG capsule Take 300 mg by mouth 2 (two) times daily.   3  . hydrochlorothiazide (MICROZIDE) 12.5 MG capsule  TAKE ONE CAPSULE BY MOUTH EVERY DAY (Patient taking differently: Take 12.5 mg by mouth once a day) 90 capsule 3  . ibuprofen (ADVIL,MOTRIN) 200 MG tablet Take 200 mg by mouth 2 (two) times daily.    Marland Kitchen amphetamine-dextroamphetamine (ADDERALL) 10 MG tablet Take 1 tablet (10 mg total) by mouth daily. (Patient not taking: Reported on 07/15/2017) 90 tablet 0  . ciprofloxacin (CIPRO) 500 MG tablet Take 1 tablet (500 mg total) by mouth 2 (two) times daily. (Patient not taking: Reported on 07/15/2017) 20 tablet 0  . ibuprofen (ADVIL,MOTRIN) 400 MG tablet Take 1 tablet (400 mg total) by mouth 2 (two) times  daily. (Patient not taking: Reported on 07/15/2017) 30 tablet 0  . metroNIDAZOLE (FLAGYL) 500 MG tablet Take 1 tablet (500 mg total) by mouth 2 (two) times daily. (Patient not taking: Reported on 07/15/2017) 20 tablet 0    Objective: BP (!) 145/46 (BP Location: Right Arm)   Pulse 77   Temp 98.5 F (36.9 C) (Oral)   Resp (!) 22   SpO2 100%  Exam: GEN: appears well, no apparent distress. Head: normocephalic, some hematoma medial to her left eye Eyes: conjunctiva without injection, sclera anicteric Ears: external ear and ear canal normal Oropharynx: mmm without erythema or exudation.  HEM: negative for cervical or periauricular lymphadenopathies CVS: RRR, nl s1 & s2, no murmurs, trace edema bilaterally RESP: no IWOB, good air movement bilaterally, CTAB GI: BS present & normal, soft, NTND GU: no suprapubic or CVA tenderness MSK: some bruising on her face, mild laceration of her upper lip, small scabbed blood on her lips SKIN: some hematoma medial to her left eye and mild upper lip laceration NEURO: awake, alert and oriented x4. Cranial nerves II-XII intact, motor 5/5 in all muscle groups of UE and LE bilaterally, normal tone, light sensation intact in all dermatomes of upper and lower ext bilaterally except for baseline diminished light sensation over the lateral aspect of the left leg, no pronator  drift, biceps and patellar reflexes 2+ bilaterally, finger to nose and heel to shin intact PSYCH: Irritable and angry  Labs and Imaging: CBC BMET  Recent Labs  Lab 07/15/17 1617  WBC 10.1  HGB 13.2  HCT 39.7  PLT 260   Recent Labs  Lab 07/15/17 1617  NA 137  K 4.0  CL 102  CO2 23  BUN 14  CREATININE 0.66  GLUCOSE 97  CALCIUM 9.4     Dg Ribs Unilateral W/chest Right  Result Date: 07/15/2017 CLINICAL DATA:  Right rib pain secondary to a fall this morning. EXAM: RIGHT RIBS AND CHEST - 3+ VIEW COMPARISON:  Chest x-ray dated 09/19/2015 and right shoulder CT dated 05/20/2016 FINDINGS: No fracture or other bone lesions are seen involving the ribs. There is no evidence of pneumothorax or pleural effusion. Both lungs are clear. Heart size and mediastinal contours are within normal limits. Thoracolumbar scoliosis. Aortic atherosclerosis. Right total shoulder prosthesis. IMPRESSION: No acute abnormality. Aortic atherosclerosis. Electronically Signed   By: Lorriane Shire M.D.   On: 07/15/2017 10:41   Ct Head Wo Contrast Result Date: 07/15/2017  IMPRESSION: Extensive right hemispheric subdural hematoma, most prominent in the right tentorium. No midline shift. Prior right parietal craniotomy and cranioplasty. Encephalomalacia right parietal lobe Negative for facial fracture. These results were called by telephone at the time of interpretation on 07/15/2017 at 3:50 pm to Dr. Dene Gentry , who verbally acknowledged these results. Electronically Signed   By: Franchot Gallo M.D.   On: 07/15/2017 15:52   Ct Maxillofacial Wo Cm Result Date: 07/15/2017  IMPRESSION: Extensive right hemispheric subdural hematoma, most prominent in the right tentorium. No midline shift. Prior right parietal craniotomy and cranioplasty. Encephalomalacia right parietal lobe Negative for facial fracture. These results were called by telephone at the time of interpretation on 07/15/2017 at 3:50 pm to Dr. Dene Gentry , who  verbally acknowledged these results. Electronically Signed   By: Franchot Gallo M.D.   On: 07/15/2017 15:52    Mercy Riding, MD 07/15/2017, 9:15 PM PGY-3, [place] [facility] pager: [phone], text pages welcome

## 2017-07-15 NOTE — ED Triage Notes (Signed)
Pt reports falling this am due to unstable gait which she has hx of same. Pt unsure what she hit her head on, denies being on blood thinners. Has bruising noted to her face, lacerations to her lips. Has right rib/chest pain. No acute distress is noted at triage.

## 2017-07-15 NOTE — Progress Notes (Signed)
Patient arrived from ED around 2040 alert and oriented lip with stitches, facial and chest pain bruiseing of face and chest as well as SDH, routine vitals and q4 nuro checks.

## 2017-07-15 NOTE — ED Notes (Signed)
Family member at bedside took pts home meds home with her. Pts clothing and glasses still at bedside

## 2017-07-15 NOTE — ED Provider Notes (Addendum)
[facility] Provider Note   CSN: [id number] Arrival date & time: 07/15/17  9518     History   Chief Complaint Chief Complaint  Patient presents with  . Fall    HPI [name] is a 83 y.o. female.  83 year old female with history of prior craniotomy status post meningioma resection, HTN, hyperlipidemia, and OA presents following mechanical fall.  She reports that she got up from her bed around 4 AM and lost her balance and fell.  She struck her left face.  She complains of a laceration to the left upper lip.  She complains of ecchymosis to the left cheek.  She also complains of mild right-sided chest wall discomfort.  She denies other injuries.  She has been ambulatory since the fall.  She denies loss of consciousness.  She denies neck pain.   The history is provided by the patient.  Fall  This is a new problem. The current episode started 6 to 12 hours ago. The problem occurs rarely. The problem has not changed since onset.Pertinent negatives include no chest pain, no abdominal pain, no headaches and no shortness of breath. Nothing aggravates the symptoms. Nothing relieves the symptoms.    Past Medical History:  Diagnosis Date  . Anemia   . Asthma    seasonal    . AVM (arteriovenous malformation)   . Bipolar disorder ([place])   . Cancer (HCC)     right leg  skin  . Depression    sucide attempt in the distant past  . Excessive sweating   . GI bleed   . Hiatal hernia    s/p repair  . HLD (hyperlipidemia)   . HTN (hypertension)   . Hypertension   . Low back pain    ESI by Dr. Nelva Bush 2 weeks ago  . OA (osteoarthritis)    bilateral wrist, bilateral knees  . Obesity   . Obesity   . Ovarian cyst   . Right wrist injury    multiple surgeries and residual weakness.   . Rotator cuff arthropathy    right  . Seizures ([place]) 1980s   2/2 meningioma. none since cranioplasty  . Shortness of breath dyspnea    if gets excited  .  Tuberculosis    + tb test      (father had)    Patient Active Problem List   Diagnosis Date Noted  . Black stool 02/10/2017  . Post-menopausal 04/25/2016  . S/P shoulder replacement 03/29/2016  . Diverticulitis of colon without hemorrhage 12/04/2015  . Rotator cuff syndrome of left shoulder 10/26/2015  . Adhesive capsulitis of right shoulder 10/26/2015  . Cervical disc disorder with radiculopathy of cervical region 10/26/2015  . Excessive sweating, local 10/03/2015  . Heart murmur, systolic 84/16/6063  . Rosacea 09/07/2015  . Adenomatous colon polyp 04/12/2014  . Hearing loss in left ear 04/13/2013  . Obesity 09/12/2012  . NSTEMI (non-ST elevated myocardial infarction) ([place]) 03/25/2012  . Osteopenia 11/13/2011  . ADD (attention deficit disorder) 02/22/2011  . Pure hypercholesterolemia 10/09/2006  . Bipolar I disorder ([place]) 10/09/2006  . Hereditary and idiopathic peripheral neuropathy 10/09/2006  . HYPERTENSION, BENIGN SYSTEMIC 10/09/2006  . OSTEOARTHRITIS, MULTI SITES 10/09/2006  . CONVULSIONS, SEIZURES, NOS 10/09/2006    Past Surgical History:  Procedure Laterality Date  . ABDOMINAL HYSTERECTOMY     left ovaries  . BREAST REDUCTION SURGERY    . CATARACT EXTRACTION Bilateral   . CRANIOPLASTY     2/2 meningioma 1980s  .  CYSTECTOMY     rt ovary  age 35   . HIATAL HERNIA REPAIR    . KNEE ARTHROPLASTY     bilat  . LEFT HEART CATHETERIZATION WITH CORONARY ANGIOGRAM N/A 03/26/2012   Procedure: LEFT HEART CATHETERIZATION WITH CORONARY ANGIOGRAM;  Surgeon: Minus Breeding, MD;  Location: [facility] CATH LAB;  Service: Cardiovascular;  Laterality: N/A;  . REVERSE SHOULDER ARTHROPLASTY Right 03/29/2016  . REVERSE SHOULDER ARTHROPLASTY Right 03/29/2016   Procedure: RIGHT REVERSE SHOULDER ARTHROPLASTY;  Surgeon: Netta Cedars, MD;  Location: [facility];  Service: Orthopedics;  Laterality: Right;    OB History    No data available       Home Medications    Prior to Admission medications     Medication Sig Start Date End Date Taking? Authorizing Provider  amphetamine-dextroamphetamine (ADDERALL) 10 MG tablet Take 1 tablet (10 mg total) by mouth daily. 12/12/16 12/12/17  [name], MD  atorvastatin (LIPITOR) 10 MG tablet TAKE 1 TABLET(10 MG) BY MOUTH AT BEDTIME 10/21/16   [name], MD  ciprofloxacin (CIPRO) 500 MG tablet Take 1 tablet (500 mg total) by mouth 2 (two) times daily. 01/29/17   [name], MD  cloNIDine (CATAPRES - DOSED IN MG/24 HR) 0.1 mg/24hr patch  10/09/16   [provider]  Coenzyme Q10 (COQ10) 200 MG CAPS Take 1 tablet by mouth daily.    [provider]  FLUoxetine (PROZAC) 40 MG capsule  10/21/16   [provider]  gabapentin (NEURONTIN) 100 MG capsule  11/30/16   [provider]  hydrochlorothiazide (MICROZIDE) 12.5 MG capsule TAKE ONE CAPSULE BY MOUTH EVERY DAY 06/20/17   [name], MD  ibuprofen (ADVIL,MOTRIN) 400 MG tablet Take 1 tablet (400 mg total) by mouth 2 (two) times daily. 03/31/16   Wylene Simmer, MD  metroNIDAZOLE (FLAGYL) 500 MG tablet Take 1 tablet (500 mg total) by mouth 2 (two) times daily. 01/29/17   [name], MD    Family History Family History  Problem Relation Age of Onset  . Alzheimer's disease Mother   . Heart disease Father        MI 30yo  . Heart disease Brother     Social History Social History   Tobacco Use  . Smoking status: Never Smoker  . Smokeless tobacco: Never Used  Substance Use Topics  . Alcohol use: No  . Drug use: No     Allergies   Amoxicillin; Penicillins; Phenobarbital; and Phenytoin   Review of Systems Review of Systems  Respiratory: Negative for shortness of breath.   Cardiovascular: Negative for chest pain.  Gastrointestinal: Negative for abdominal pain.  Skin:       Lip laceration   Neurological: Negative for headaches.  All other systems reviewed and are negative.    Physical Exam Updated Vital Signs BP (!) 160/71 (BP  Location: Right Arm)   Pulse 72   Temp 98 F (36.7 C) (Oral)   Resp 18   SpO2 98%   Physical Exam  Constitutional: She is oriented to person, place, and time. She appears well-developed and well-nourished. No distress.  HENT:  Head: Normocephalic.  Mouth/Throat: Oropharynx is clear and moist.  Left upper lateral lip laceration (inferior and inner aspect of lip) - this does not involve the [place] border  No dental trauma  Eyes: Conjunctivae and EOM are normal. Pupils are equal, round, and reactive to light.  Neck: Normal range of motion. Neck supple.  Cardiovascular: Normal rate, regular rhythm and  normal heart sounds.  Pulmonary/Chest: Effort normal and breath sounds normal. No respiratory distress.  Abdominal: Soft. She exhibits no distension. There is no tenderness.  Musculoskeletal: Normal range of motion. She exhibits no edema or deformity.  Neurological: She is alert and oriented to person, place, and time.  Skin: Skin is warm and dry.  Psychiatric: She has a normal mood and affect.  Nursing note and vitals reviewed.    ED Treatments / Results  Labs (all labs ordered are listed, but only abnormal results are displayed) Labs Reviewed  COMPREHENSIVE METABOLIC PANEL  CBC WITH DIFFERENTIAL/PLATELET  PROTIME-INR  TYPE AND SCREEN    EKG  EKG Interpretation None       Radiology Dg Ribs Unilateral W/chest Right  Result Date: 07/15/2017 CLINICAL DATA:  Right rib pain secondary to a fall this morning. EXAM: RIGHT RIBS AND CHEST - 3+ VIEW COMPARISON:  Chest x-ray dated 09/19/2015 and right shoulder CT dated 05/20/2016 FINDINGS: No fracture or other bone lesions are seen involving the ribs. There is no evidence of pneumothorax or pleural effusion. Both lungs are clear. Heart size and mediastinal contours are within normal limits. Thoracolumbar scoliosis. Aortic atherosclerosis. Right total shoulder prosthesis. IMPRESSION: No acute abnormality. Aortic atherosclerosis.  Electronically Signed   By: Lorriane Shire M.D.   On: 07/15/2017 10:41    Procedures Procedures (including critical care time) CRITICAL CARE Performed by: Valarie Merino   Total critical care time: 30 minutes  Critical care time was exclusive of separately billable procedures and treating other patients.  Critical care was necessary to treat or prevent imminent or life-threatening deterioration.  Critical care was time spent personally by me on the following activities: development of treatment plan with patient and/or surrogate as well as nursing, discussions with consultants, evaluation of patient's response to treatment, examination of patient, obtaining history from patient or surrogate, ordering and performing treatments and interventions, ordering and review of laboratory studies, ordering and review of radiographic studies, pulse oximetry and re-evaluation of patient's condition.   Medications Ordered in ED Medications  lidocaine (PF) (XYLOCAINE) 1 % injection 2 mL (not administered)     Initial Impression / Assessment and Plan / ED Course  I have reviewed the triage vital signs and the nursing notes.  Pertinent labs & imaging results that were available during my care of the patient were reviewed by me and considered in my medical decision making (see chart for details).     1640 Case discussed with Neurosurgery - patient is not an operative candidate at this time. Patient to be observed by hospital medicine service.    MSE complete  Patient with nonoperative subdural following mechanical fall.  Patient has been evaluated by neurosurgery.  Patient to be admitted for observation to the hospital medicine team. Lip laceration repaired by ED Midlevel. Family medicine service aware of case.   Final Clinical Impressions(s) / ED Diagnoses   Final diagnoses:  Subdural hematoma ([place])  Fall, initial encounter  Lip laceration, initial encounter    ED Discharge Orders     None       Valarie Merino, MD 07/15/17 1644    Valarie Merino, MD 07/15/17 1719

## 2017-07-15 NOTE — ED Provider Notes (Signed)
LACERATION REPAIR Performed by: Jillyn Ledger Authorized by: Jillyn Ledger Consent: Verbal consent obtained. Risks and benefits: risks, benefits and alternatives were discussed Consent given by: patient Patient identity confirmed: provided demographic data Prepped and Draped in normal sterile fashion Wound explored  Laceration Location: Left upper lateral lip laceration (inferior and inner aspect of lip) - this does not cross [place] border  Laceration Length: 2 cm   No Foreign Bodies seen or palpated  Anesthesia: local infiltration  Local anesthetic: lidocaine 1% without epinephrine  Anesthetic total: 5 ml  Irrigation method: syringe with normal saline.  Wound explored in a bloodless field and evidence no foreign bodies. Amount of cleaning: standard  Skin closure: complex, 5-0 chromic gut absorbale  Number of sutures: 6 - 3 deep and 3 superficial  Technique: simple interrupted   Patient tolerance: Patient tolerated the procedure well with no immediate complications.     Jillyn Ledger, PA-C 07/15/17 1706    Valarie Merino, MD 07/15/17 [phone]

## 2017-07-15 NOTE — ED Notes (Signed)
Pt given ice chips, ok by EDP

## 2017-07-16 ENCOUNTER — Other Ambulatory Visit: Payer: Self-pay | Admitting: Family Medicine

## 2017-07-16 DIAGNOSIS — F319 Bipolar disorder, unspecified: Secondary | ICD-10-CM | POA: Diagnosis not present

## 2017-07-16 DIAGNOSIS — S065X0A Traumatic subdural hemorrhage without loss of consciousness, initial encounter: Secondary | ICD-10-CM | POA: Diagnosis not present

## 2017-07-16 DIAGNOSIS — W19XXXA Unspecified fall, initial encounter: Secondary | ICD-10-CM

## 2017-07-16 DIAGNOSIS — F908 Attention-deficit hyperactivity disorder, other type: Secondary | ICD-10-CM

## 2017-07-16 DIAGNOSIS — S065X9A Traumatic subdural hemorrhage with loss of consciousness of unspecified duration, initial encounter: Secondary | ICD-10-CM | POA: Diagnosis not present

## 2017-07-16 LAB — URINALYSIS, ROUTINE W REFLEX MICROSCOPIC
BACTERIA UA: NONE SEEN
Bilirubin Urine: NEGATIVE
Glucose, UA: NEGATIVE mg/dL
HGB URINE DIPSTICK: NEGATIVE
Ketones, ur: NEGATIVE mg/dL
Nitrite: NEGATIVE
PROTEIN: NEGATIVE mg/dL
Specific Gravity, Urine: 1.01 (ref 1.005–1.030)
pH: 6 (ref 5.0–8.0)

## 2017-07-16 MED ORDER — AMPHETAMINE-DEXTROAMPHETAMINE 10 MG PO TABS: 10.0000 mg | ORAL_TABLET | Freq: Every day | ORAL | 0 refills | Status: DC

## 2017-07-16 MED ORDER — IBUPROFEN 200 MG PO TABS: 200.0000 mg | ORAL_TABLET | Freq: Four times a day (QID) | ORAL | Status: DC | PRN

## 2017-07-16 MED ORDER — FLUOXETINE HCL 20 MG PO CAPS
40.0000 mg | ORAL_CAPSULE | Freq: Every day | ORAL | Status: DC
  Administered 2017-07-16: 40 mg via ORAL
  Filled 2017-07-16: qty 2

## 2017-07-16 MED ORDER — RAMELTEON 8 MG PO TABS
8.0000 mg | ORAL_TABLET | Freq: Every evening | ORAL | Status: DC | PRN
  Filled 2017-07-16: qty 1

## 2017-07-16 MED ORDER — IBUPROFEN 200 MG PO TABS
600.0000 mg | ORAL_TABLET | Freq: Four times a day (QID) | ORAL | Status: DC | PRN
  Administered 2017-07-16: 200 mg via ORAL
  Administered 2017-07-16: 600 mg via ORAL
  Filled 2017-07-16 (×2): qty 3

## 2017-07-16 NOTE — Progress Notes (Signed)
Discharge instructions explained and provided to patient, patient varbalized understanding. Patient left floor via wheelchair accompanied by staff.   Azriella Mattia, Tivis Ringer, RN

## 2017-07-16 NOTE — Telephone Encounter (Signed)
Pt called nurse line and wants a refill on adderal.  Dr. Andria Frames sent electronically.  Attempted to call pt back, no answer and no machine. Fleeger, Salome Spotted, CMA

## 2017-07-16 NOTE — Progress Notes (Signed)
Family Medicine Teaching Service Daily Progress Note Intern Pager: [phone]  Patient name: [name] Medical record number: [id number] Date of birth: [birth year] Age: 83 y.o. Gender: female  Primary Care Provider: [name], MD Consultants: Neurosurgery Code Status: Full  Pt Overview and Major Events to Date:  [name] is a 83 y.o. female presenting with fall at home. PMH is significant for bipolar 1 disorder, hypertension, osteoarthritis, ADD, and NSTEMI, meningioma status post resection in 1980, AVM  Assessment and Plan:  Fall with subsequent subdural hematoma: Neuro exam within normal limits.  Head CT shows evidence of some subdural hematoma with larger component about 15 mm teak along the right tentorium but no significant mass-effect.  No indication for surgery per NS. Fall likely mechanical.  -Neuro check every 4 hours -Cardiac monitoring -Avoid blood thinner and NSAID -Neurosurgery reports not need for repeat exam, appreciate recommendations -Consider 30-day event monitor -PT/OT consult- Home PT recommended- Patient declines and reports she will go to PT  Bipolar I disorder/depression: History of this in her chart.  She is only on Prozac.  She is irritable and angry concerning for pneumonia. -Continue her prozac  Hypertension: Mildly hypertensive -Continue home hydrochlorothiazide -We will continue monitoring   Hyperlipidemia: on atorvastatin at home. Not sure about benefit given her age -Continue home atorvastatin  Chronic pain: On ibuprofen and gabapentin at home. -Continuing low dose ibuprofen  -Continue gabapentin -Tylenol PRN  FEN/GI: -Regular diet  Prophylaxis: -SCD given intracranial bleeding  Disposition: home  Subjective:  Patient very unhappy this morning with having to wait in the ED for 5 hours.  She is insistent that she wants to go home.  She is told that she is able to leave AMA as she would like.  She reports that she will  not talk to any other doctor other than Dr. Andria Frames.  She reports that she sometimes has feelings of suicide but when I questioned this further she said she is refusing to talk to anyone who is not Dr. Andria Frames.  Dr. Andria Frames saw patient and she will be discharged.  Objective: Temp:  [98 F (36.7 C)-98.5 F (36.9 C)] 98.1 F (36.7 C) (12/05 0517) Pulse Rate:  [57-100] 57 (12/05 0517) Resp:  [12-22] 17 (12/05 0517) BP: (116-168)/(46-109) 136/52 (12/05 0517) SpO2:  [88 %-100 %] 95 % (12/05 0517) Physical Exam: General: NAD, unhappy Neck: Supple, no LAD Cardiovascular: RRR, no m/r/g, no LE edema Respiratory: CTA BL, normal work of breathing MSK: moves 4 extremities equally Derm: no rashes appreciated Psych: AO, aggressive  Laboratory: Recent Labs  Lab 07/15/17 1617  WBC 10.1  HGB 13.2  HCT 39.7  PLT 260   Recent Labs  Lab 07/15/17 1617  NA 137  K 4.0  CL 102  CO2 23  BUN 14  CREATININE 0.66  CALCIUM 9.4  PROT 7.3  BILITOT 1.6*  ALKPHOS 93  ALT 12*  AST 36  GLUCOSE 97    Imaging/Diagnostic Tests: Dg Ribs Unilateral W/chest Right  Result Date: 07/15/2017 CLINICAL DATA:  Right rib pain secondary to a fall this morning. EXAM: RIGHT RIBS AND CHEST - 3+ VIEW COMPARISON:  Chest x-ray dated 09/19/2015 and right shoulder CT dated 05/20/2016 FINDINGS: No fracture or other bone lesions are seen involving the ribs. There is no evidence of pneumothorax or pleural effusion. Both lungs are clear. Heart size and mediastinal contours are within normal limits. Thoracolumbar scoliosis. Aortic atherosclerosis. Right total shoulder prosthesis. IMPRESSION: No acute abnormality. Aortic atherosclerosis. Electronically Signed  By: Lorriane Shire M.D.   On: 07/15/2017 10:41   Ct Head Wo Contrast  Result Date: 07/15/2017 CLINICAL DATA:  Ataxia.  Fall.  Head injury EXAM: CT HEAD WITHOUT CONTRAST CT MAXILLOFACIAL WITHOUT CONTRAST TECHNIQUE: Multidetector CT imaging of the head and maxillofacial  structures were performed using the standard protocol without intravenous contrast. Multiplanar CT image reconstructions of the maxillofacial structures were also generated. COMPARISON:  CT head 11/19/2012 FINDINGS: CT HEAD FINDINGS Brain: Right hemispheric subdural hematoma. Subfrontal subdural hematoma on the right measures 5 mm. Right temporal subdural hematoma measures 6 mm. Focal tentorial subdural hematoma on the right measures 15 mm in thickness. Posterior interhemispheric subdural hematoma measures approximately 4 mm. Negative for subarachnoid hemorrhage. No midline shift. Generalized atrophy without hydrocephalus. Right parietal craniotomy with encephalomalacia in the right high parietal lobe unchanged. Negative for acute infarct or mass. Vascular: Negative for hyperdense vessel Skull: Right parietal craniectomy and reconstruction. Small frontal osteoma projecting anteriorly. Other: None CT MAXILLOFACIAL FINDINGS Osseous: Negative for facial fracture. Degenerative change in the cervical spine postop emboli at C1-2, C5-6, C6-7. 3 mm anterolisthesis C5-6 likely due to disc and facet degeneration. Orbits: Normal soft tissues the orbit.  Bilateral lens replacement. Sinuses: Paranasal sinuses clear.  No air-fluid level. Soft tissues: No significant facial soft tissue swelling IMPRESSION: Extensive right hemispheric subdural hematoma, most prominent in the right tentorium. No midline shift. Prior right parietal craniotomy and cranioplasty. Encephalomalacia right parietal lobe Negative for facial fracture. These results were called by telephone at the time of interpretation on 07/15/2017 at 3:50 pm to Dr. Dene Gentry , who verbally acknowledged these results. Electronically Signed   By: Franchot Gallo M.D.   On: 07/15/2017 15:52   Ct Maxillofacial Wo Cm  Result Date: 07/15/2017 CLINICAL DATA:  Ataxia.  Fall.  Head injury EXAM: CT HEAD WITHOUT CONTRAST CT MAXILLOFACIAL WITHOUT CONTRAST TECHNIQUE: Multidetector  CT imaging of the head and maxillofacial structures were performed using the standard protocol without intravenous contrast. Multiplanar CT image reconstructions of the maxillofacial structures were also generated. COMPARISON:  CT head 11/19/2012 FINDINGS: CT HEAD FINDINGS Brain: Right hemispheric subdural hematoma. Subfrontal subdural hematoma on the right measures 5 mm. Right temporal subdural hematoma measures 6 mm. Focal tentorial subdural hematoma on the right measures 15 mm in thickness. Posterior interhemispheric subdural hematoma measures approximately 4 mm. Negative for subarachnoid hemorrhage. No midline shift. Generalized atrophy without hydrocephalus. Right parietal craniotomy with encephalomalacia in the right high parietal lobe unchanged. Negative for acute infarct or mass. Vascular: Negative for hyperdense vessel Skull: Right parietal craniectomy and reconstruction. Small frontal osteoma projecting anteriorly. Other: None CT MAXILLOFACIAL FINDINGS Osseous: Negative for facial fracture. Degenerative change in the cervical spine postop emboli at C1-2, C5-6, C6-7. 3 mm anterolisthesis C5-6 likely due to disc and facet degeneration. Orbits: Normal soft tissues the orbit.  Bilateral lens replacement. Sinuses: Paranasal sinuses clear.  No air-fluid level. Soft tissues: No significant facial soft tissue swelling IMPRESSION: Extensive right hemispheric subdural hematoma, most prominent in the right tentorium. No midline shift. Prior right parietal craniotomy and cranioplasty. Encephalomalacia right parietal lobe Negative for facial fracture. These results were called by telephone at the time of interpretation on 07/15/2017 at 3:50 pm to Dr. Dene Gentry , who verbally acknowledged these results. Electronically Signed   By: Franchot Gallo M.D.   On: 07/15/2017 15:52    Charnita Trudel, [place], DO 07/16/2017, 6:38 AM PGY-1, [facility] Intern pager: [phone], text pages welcome

## 2017-07-16 NOTE — Discharge Summary (Signed)
[facility] Discharge Summary  Patient name: [name] Medical record number: [id number] Date of birth: [birth year] Age: 83 y.o. Gender: female Date of Admission: 07/15/2017  Date of Discharge: 07/17/17  Admitting Physician: Alveda Reasons, MD  Primary Care Provider: [name], MD Consultants: Neurosurgery  Indication for Hospitalization: Fall  Discharge Diagnoses/Problem List:  Subdural hematoma after mechanical fall Bipolar type I HTN Meningioma S/P resection  Disposition: Home  Discharge Condition: Stable  Discharge Exam:  See progress note from day of discharge  Brief Hospital Course:  83 year old female who presented after mechanical fall at home was found to have subdural hematoma on CT in ED.  CT head showed right hemispheric subdural hematoma, subfrontal subdural hematoma on the right measures 5 mm, right temporal subdural hematoma measures 6 mm, focal tentorial subdural hematoma on the right measures 15 mm in thickness and posterior interhemispheric. Neurosurgery was consulted in the ED he reported no need for emergent surgery and recommended admission for observation and medical management.  Patient was observed overnight on cardiac monitor.  Physical therapy saw patient and recommended home health PT however patient declined but reported that she will go to PT.  Occupational Therapy also saw patient who reported that she did not need follow-up.  Patient was very insistent on only wanting to see her primary physician Dr. Andria Frames.  Patient was instructed to call the neurosurgeon if she should have any problems.  She was very anxious for discharge on the second day of admission, and neurosurgery did not think a follow-up head CT scan was crucial.  Patient was discharged with instruction to follow-up with PCP Dr. Andria Frames.  All  Issues for Follow Up:  1. Please follow up with PT as recommended. 2. Patient mentioned suicidal ideation during  stay, however she did not answer any further questions regarding this and refused to talk to me.  Please follow-up on her possible suicidal ideation. 3. Patient at risk of further bleeding, will need to call neurosurgeon if she is having any new problems.  Neurosurgery did not recommend a follow-up head CT.  Significant Procedures: none  Significant Labs and Imaging:  Recent Labs  Lab 07/15/17 1617  WBC 10.1  HGB 13.2  HCT 39.7  PLT 260   Recent Labs  Lab 07/15/17 1617  NA 137  K 4.0  CL 102  CO2 23  GLUCOSE 97  BUN 14  CREATININE 0.66  CALCIUM 9.4  ALKPHOS 93  AST 36  ALT 12*  ALBUMIN 3.8   Dg Ribs Unilateral W/chest Right  Result Date: 07/15/2017 CLINICAL DATA:  Right rib pain secondary to a fall this morning. EXAM: RIGHT RIBS AND CHEST - 3+ VIEW COMPARISON:  Chest x-ray dated 09/19/2015 and right shoulder CT dated 05/20/2016 FINDINGS: No fracture or other bone lesions are seen involving the ribs. There is no evidence of pneumothorax or pleural effusion. Both lungs are clear. Heart size and mediastinal contours are within normal limits. Thoracolumbar scoliosis. Aortic atherosclerosis. Right total shoulder prosthesis. IMPRESSION: No acute abnormality. Aortic atherosclerosis. Electronically Signed   By: Lorriane Shire M.D.   On: 07/15/2017 10:41   Ct Head Wo Contrast  Result Date: 07/15/2017 CLINICAL DATA:  Ataxia.  Fall.  Head injury EXAM: CT HEAD WITHOUT CONTRAST CT MAXILLOFACIAL WITHOUT CONTRAST TECHNIQUE: Multidetector CT imaging of the head and maxillofacial structures were performed using the standard protocol without intravenous contrast. Multiplanar CT image reconstructions of the maxillofacial structures were also generated. COMPARISON:  CT head 11/19/2012  FINDINGS: CT HEAD FINDINGS Brain: Right hemispheric subdural hematoma. Subfrontal subdural hematoma on the right measures 5 mm. Right temporal subdural hematoma measures 6 mm. Focal tentorial subdural hematoma on the  right measures 15 mm in thickness. Posterior interhemispheric subdural hematoma measures approximately 4 mm. Negative for subarachnoid hemorrhage. No midline shift. Generalized atrophy without hydrocephalus. Right parietal craniotomy with encephalomalacia in the right high parietal lobe unchanged. Negative for acute infarct or mass. Vascular: Negative for hyperdense vessel Skull: Right parietal craniectomy and reconstruction. Small frontal osteoma projecting anteriorly. Other: None CT MAXILLOFACIAL FINDINGS Osseous: Negative for facial fracture. Degenerative change in the cervical spine postop emboli at C1-2, C5-6, C6-7. 3 mm anterolisthesis C5-6 likely due to disc and facet degeneration. Orbits: Normal soft tissues the orbit.  Bilateral lens replacement. Sinuses: Paranasal sinuses clear.  No air-fluid level. Soft tissues: No significant facial soft tissue swelling IMPRESSION: Extensive right hemispheric subdural hematoma, most prominent in the right tentorium. No midline shift. Prior right parietal craniotomy and cranioplasty. Encephalomalacia right parietal lobe Negative for facial fracture. These results were called by telephone at the time of interpretation on 07/15/2017 at 3:50 pm to Dr. Dene Gentry , who verbally acknowledged these results. Electronically Signed   By: Franchot Gallo M.D.   On: 07/15/2017 15:52   Ct Maxillofacial Wo Cm  Result Date: 07/15/2017 CLINICAL DATA:  Ataxia.  Fall.  Head injury EXAM: CT HEAD WITHOUT CONTRAST CT MAXILLOFACIAL WITHOUT CONTRAST TECHNIQUE: Multidetector CT imaging of the head and maxillofacial structures were performed using the standard protocol without intravenous contrast. Multiplanar CT image reconstructions of the maxillofacial structures were also generated. COMPARISON:  CT head 11/19/2012 FINDINGS: CT HEAD FINDINGS Brain: Right hemispheric subdural hematoma. Subfrontal subdural hematoma on the right measures 5 mm. Right temporal subdural hematoma measures 6 mm.  Focal tentorial subdural hematoma on the right measures 15 mm in thickness. Posterior interhemispheric subdural hematoma measures approximately 4 mm. Negative for subarachnoid hemorrhage. No midline shift. Generalized atrophy without hydrocephalus. Right parietal craniotomy with encephalomalacia in the right high parietal lobe unchanged. Negative for acute infarct or mass. Vascular: Negative for hyperdense vessel Skull: Right parietal craniectomy and reconstruction. Small frontal osteoma projecting anteriorly. Other: None CT MAXILLOFACIAL FINDINGS Osseous: Negative for facial fracture. Degenerative change in the cervical spine postop emboli at C1-2, C5-6, C6-7. 3 mm anterolisthesis C5-6 likely due to disc and facet degeneration. Orbits: Normal soft tissues the orbit.  Bilateral lens replacement. Sinuses: Paranasal sinuses clear.  No air-fluid level. Soft tissues: No significant facial soft tissue swelling IMPRESSION: Extensive right hemispheric subdural hematoma, most prominent in the right tentorium. No midline shift. Prior right parietal craniotomy and cranioplasty. Encephalomalacia right parietal lobe Negative for facial fracture. These results were called by telephone at the time of interpretation on 07/15/2017 at 3:50 pm to Dr. Dene Gentry , who verbally acknowledged these results. Electronically Signed   By: Franchot Gallo M.D.   On: 07/15/2017 15:52     Results/Tests Pending at Time of Discharge: none  Discharge Medications:  Allergies as of 07/16/2017      Reactions   Amoxicillin Anaphylaxis   Penicillins Anaphylaxis, Swelling   From childhood: Has patient had a PCN reaction causing immediate rash, facial/tongue/throat swelling, SOB or lightheadedness with hypotension: Yes Has patient had a PCN reaction causing severe rash involving mucus membranes or skin necrosis: Unk Has patient had a PCN reaction that required hospitalization: No Has patient had a PCN reaction occurring within the last 10  years: No If all of the  above answers are "NO", then may proceed with Cephalosporin use. Marland Kitchen   Phenobarbital Other (See Comments)   Reaction not recalled by the patient   Phenytoin Other (See Comments)   "Makes me feel funny"    Tape Rash   Please use paper tape      Medication List    STOP taking these medications   ibuprofen 200 MG tablet Commonly known as:  ADVIL,MOTRIN   ibuprofen 400 MG tablet Commonly known as:  ADVIL,MOTRIN   metroNIDAZOLE 500 MG tablet Commonly known as:  FLAGYL     TAKE these medications   atorvastatin 10 MG tablet Commonly known as:  LIPITOR TAKE 1 TABLET(10 MG) BY MOUTH AT BEDTIME What changed:  See the new instructions.   ciprofloxacin 500 MG tablet Commonly known as:  CIPRO Take 1 tablet (500 mg total) by mouth 2 (two) times daily.   CoQ10 200 MG Caps Take 200 mg by mouth daily.   FLUoxetine 40 MG capsule Commonly known as:  PROZAC Take 40 mg by mouth daily.   gabapentin 300 MG capsule Commonly known as:  NEURONTIN Take 300 mg by mouth 2 (two) times daily.   hydrochlorothiazide 12.5 MG capsule Commonly known as:  MICROZIDE TAKE ONE CAPSULE BY MOUTH EVERY DAY What changed:    how much to take  how to take this  when to take this     ASK your doctor about these medications   amphetamine-dextroamphetamine 10 MG tablet Commonly known as:  ADDERALL Take 1 tablet (10 mg total) by mouth daily. Ask about: Which instructions should I use?       Discharge Instructions: Please refer to Patient Instructions section of EMR for full details.  Patient was counseled important signs and symptoms that should prompt return to medical care, changes in medications, dietary instructions, activity restrictions, and follow up appointments.   Follow-Up Appointments: Follow-up Information    [facility] Follow up.   Specialty:  Rehabilitation Why:  They will contact you for the first appointment Contact  information: [address] [phone] [place]          Mckenzi Buonomo, [place], [place] 07/17/2017, 3:03 PM PGY-1, [place]

## 2017-07-16 NOTE — Care Management CC44 (Signed)
Condition Code 44 Documentation Completed  Patient Details  Name: [name] MRN: [id number] Date of Birth: 1930-11-30   Condition Code 44 given:  Yes Patient signature on Condition Code 44 notice:  Yes Documentation of 2 MD's agreement:  Yes Code 44 added to claim:  Yes    Pollie Friar, RN 07/16/2017, 11:43 AM

## 2017-07-16 NOTE — Evaluation (Signed)
Physical Therapy Evaluation Patient Details Name: [name] MRN: [id number] DOB: 1930-09-26 Today's Date: 07/16/2017   History of Present Illness  83 year old female with past history of right-sided craniectomy and resection of AVM with eventual cranioplasty placement.  Patient presents now after a fall. MRI revealed small right convexity subdural hematoma with extension overlying the right tentorium.    Clinical Impression  Upon entry pt is very upset with service at [facility]. Pt reluctantly agrees to PT evaluation. PTA pt independent in ADLs, iADLs, community ambulator without AD and driver. Pt is limited in her mobility by decreased static and dynamic balance. Pt currently independent with bed mobility and hands on min guard for transfers and ambulation of 100 feet with RW. Pt educated on need for slowing down to assess balance before mobility, use of RW with all ambulation and continued PT at discharge to increase balance and stability. Pt would benefit from HHPT however will only agrees to outpatient PT at D/C. PT will continue to follow pt acutely.     Follow Up Recommendations Outpatient PT;Supervision - Intermittent    Equipment Recommendations  None recommended by PT       Precautions / Restrictions Precautions Precautions: None Restrictions Weight Bearing Restrictions: No      Mobility  Bed Mobility Overal bed mobility: Independent                Transfers Overall transfer level: Needs assistance Equipment used: Rolling walker (2 wheeled) Transfers: Sit to/from Stand Sit to Stand: Min guard         General transfer comment: min guard for safety, required x2 attempts to maintain balance in standing, requested not to be touched during transfer  Ambulation/Gait Ambulation/Gait assistance: Min guard Ambulation Distance (Feet): 100 Feet Assistive device: Rolling walker (2 wheeled) Gait Pattern/deviations: Step-through pattern Gait velocity: increased   Gait velocity interpretation: at or above normal speed for age/gender General Gait Details: hands on min guard for safety, instability with gait secondary to  increased cadence, narrow BoS and 2x scissoring, as ambulation progressed pt with increased limp adding to instability, pt denied pain vc for slowing down, maintaining feet hips distance apart,       Balance Overall balance assessment: Needs assistance Sitting-balance support: Feet supported;No upper extremity supported Sitting balance-Leahy Scale: Good     Standing balance support: No upper extremity supported;During functional activity Standing balance-Leahy Scale: Fair Standing balance comment: pt required 2 attempts to maintain balance in standing with sit>stand          [place] - Eyes Opened: 30 [place] - Eyes Closed: 15                 Pertinent Vitals/Pain Pain Assessment: No/denies pain    Home Living Family/patient expects to be discharged to:: Private residence Living Arrangements: Alone Available Help at Discharge: Neighbor;Available PRN/intermittently Type of Home: House Home Access: Stairs to enter;Level entry     Home Layout: One level Home Equipment: Walker - 2 wheels;Cane - single point      Prior Function Level of Independence: Independent         Comments: independent with ADL, iADLs, community ambulator, driver     Hand Dominance   Dominant Hand: Right    Extremity/Trunk Assessment   Upper Extremity Assessment Upper Extremity Assessment: Defer to OT evaluation    Lower Extremity Assessment Lower Extremity Assessment: RLE deficits/detail;LLE deficits/detail RLE Deficits / Details: strength grossly 5/5, ROM WFL LLE Deficits / Details: strength grossly 5/5, ROM [facility]  LLE Sensation: decreased light touch(from mid shin down to toes decreased sensation and numbness)       Communication   Communication: No difficulties  Cognition Arousal/Alertness: Awake/alert Behavior  During Therapy: Agitated Overall Cognitive Status: No family/caregiver present to determine baseline cognitive functioning                                 General Comments: Pt very agitated by level of care at [facility], difficult to determine baseline level due to ongoing complaints with history       General Comments General comments (skin integrity, edema, etc.): Dr. Andria Frames arrived midway through exam and observed remaining portion of exam        Assessment/Plan    PT Assessment Patient needs continued PT services  PT Problem List Decreased balance;Decreased mobility;Decreased safety awareness;Decreased knowledge of use of DME;Impaired sensation       PT Treatment Interventions Balance training;Functional mobility training;Gait training;DME instruction;Neuromuscular re-education;Patient/family education    PT Goals (Current goals can be found in the Care Plan section)  Acute Rehab PT Goals Patient Stated Goal: go home now PT Goal Formulation: With patient Time For Goal Achievement: 07/30/17 Potential to Achieve Goals: Fair    Frequency Min 3X/week   Barriers to discharge Decreased caregiver support         AM-PAC PT "6 Clicks" Daily Activity  Outcome Measure Difficulty turning over in bed (including adjusting bedclothes, sheets and blankets)?: A Little Difficulty moving from lying on back to sitting on the side of the bed? : A Little Difficulty sitting down on and standing up from a chair with arms (e.g., wheelchair, bedside commode, etc,.)?: A Lot Help needed moving to and from a bed to chair (including a wheelchair)?: None Help needed walking in hospital room?: None Help needed climbing 3-5 steps with a railing? : A Little 6 Click Score: 19    End of Session Equipment Utilized During Treatment: Gait belt Activity Tolerance: Patient tolerated treatment well Patient left: in bed;with call bell/phone within reach;with bed alarm set(Dr Hensel in  room) Nurse Communication: Mobility status PT Visit Diagnosis: Unsteadiness on feet (R26.81);Other abnormalities of gait and mobility (R26.89);History of falling (Z91.81);Difficulty in walking, not elsewhere classified (R26.2);Other symptoms and signs involving the nervous system (G62.694)    Time: 8546-2703 PT Time Calculation (min) (ACUTE ONLY): 40 min   Charges:   PT Evaluation $PT Eval Moderate Complexity: 1 Mod PT Treatments $Gait Training: 8-22 mins $Therapeutic Activity: 8-22 mins   PT G Codes:        Barabara Motz B. Migdalia Dk PT, DPT Acute Rehabilitation  [phone] Pager [phone]    [place] 07/16/2017, 11:01 AM

## 2017-07-16 NOTE — Discharge Instructions (Signed)
You were admitted after a fall in the home. Neurosurgery has cleared you after hitting your head. Please follow up with outpatient physical therapy to work on your balance to prevent future falls.

## 2017-07-16 NOTE — Care Management Obs Status (Signed)
[place] NOTIFICATION   Patient Details  Name: [name] MRN: [id number] Date of Birth: 10/12/1930   Medicare Observation Status Notification Given:  Yes    Pollie Friar, RN 07/16/2017, 11:43 AM

## 2017-07-16 NOTE — Consult Note (Addendum)
Reason for Consult: Subdural hematoma Referring Physician: Medicine  [name] is an 83 y.o. female.  HPI: 83 year old female with past history of right-sided craniectomy and resection of AVM with eventual cranioplasty placement.  Patient presents now after a fall.  She denies headache.  She denies numbness paresthesias or weakness.  She states the fall was caused by tripping.  Past Medical History:  Diagnosis Date  . Anemia   . Asthma    seasonal    . AVM (arteriovenous malformation)   . Bipolar disorder ([place])   . Cancer (HCC)     right leg  skin  . Depression    sucide attempt in the distant past  . Excessive sweating   . GI bleed   . Hiatal hernia    s/p repair  . HLD (hyperlipidemia)   . HTN (hypertension)   . Hypertension   . Low back pain    ESI by Dr. Nelva Bush 2 weeks ago  . OA (osteoarthritis)    bilateral wrist, bilateral knees  . Obesity   . Obesity   . Ovarian cyst   . Right wrist injury    multiple surgeries and residual weakness.   . Rotator cuff arthropathy    right  . Seizures ([place]) 1980s   2/2 meningioma. none since cranioplasty  . Shortness of breath dyspnea    if gets excited  . Tuberculosis    + tb test      (father had)    Past Surgical History:  Procedure Laterality Date  . ABDOMINAL HYSTERECTOMY     left ovaries  . BREAST REDUCTION SURGERY    . CATARACT EXTRACTION Bilateral   . CRANIOPLASTY     2/2 meningioma 1980s  . CYSTECTOMY     rt ovary  age 38   . HIATAL HERNIA REPAIR    . KNEE ARTHROPLASTY     bilat  . LEFT HEART CATHETERIZATION WITH CORONARY ANGIOGRAM N/A 03/26/2012   Procedure: LEFT HEART CATHETERIZATION WITH CORONARY ANGIOGRAM;  Surgeon: Minus Breeding, MD;  Location[facility] CATH LAB;  Service: Cardiovascular;  Laterality: N/A;  . REVERSE SHOULDER ARTHROPLASTY Right 03/29/2016  . REVERSE SHOULDER ARTHROPLASTY Right 03/29/2016   Procedure: RIGHT REVERSE SHOULDER ARTHROPLASTY;  Surgeon: Netta Cedars, MD;  Location: [place];  Service:  Orthopedics;  Laterality: Right;    Family History  Problem Relation Age of Onset  . Alzheimer's disease Mother   . Heart disease Father        MI 63yo  . Heart disease Brother     Social History:  reports that  has never smoked. she has never used smokeless tobacco. She reports that she does not drink alcohol or use drugs.  Allergies:  Allergies  Allergen Reactions  . Amoxicillin Anaphylaxis  . Penicillins Anaphylaxis and Swelling    From childhood: Has patient had a PCN reaction causing immediate rash, facial/tongue/throat swelling, SOB or lightheadedness with hypotension: Yes Has patient had a PCN reaction causing severe rash involving mucus membranes or skin necrosis: Unk Has patient had a PCN reaction that required hospitalization: No Has patient had a PCN reaction occurring within the last 10 years: No If all of the above answers are "NO", then may proceed with Cephalosporin use. .  . Phenobarbital Other (See Comments)    Reaction not recalled by the patient  . Phenytoin Other (See Comments)    "Makes me feel funny"   . Tape Rash    Please use paper tape    Medications: I  have reviewed the patient's current medications.  Results for orders placed or performed during the hospital encounter of 07/15/17 (from the past 48 hour(s))  Urinalysis, Routine w reflex microscopic     Status: Abnormal   Collection Time: 07/15/17  1:03 AM  Result Value Ref Range   Color, Urine YELLOW YELLOW   APPearance CLEAR CLEAR   Specific Gravity, Urine 1.010 1.005 - 1.030   pH 6.0 5.0 - 8.0   Glucose, UA NEGATIVE NEGATIVE mg/dL   Hgb urine dipstick NEGATIVE NEGATIVE   Bilirubin Urine NEGATIVE NEGATIVE   Ketones, ur NEGATIVE NEGATIVE mg/dL   Protein, ur NEGATIVE NEGATIVE mg/dL   Nitrite NEGATIVE NEGATIVE   Leukocytes, UA TRACE (A) NEGATIVE   RBC / HPF 0-5 0 - 5 RBC/hpf   WBC, UA 6-30 0 - 5 WBC/hpf   Bacteria, UA NONE SEEN NONE SEEN   Squamous Epithelial / LPF 0-5 (A) NONE SEEN   Comprehensive metabolic panel     Status: Abnormal   Collection Time: 07/15/17  4:17 PM  Result Value Ref Range   Sodium 137 135 - 145 mmol/L   Potassium 4.0 3.5 - 5.1 mmol/L    Comment: HEMOLYSIS AT THIS LEVEL MAY AFFECT RESULT   Chloride 102 101 - 111 mmol/L   CO2 23 22 - 32 mmol/L   Glucose, Bld 97 65 - 99 mg/dL   BUN 14 6 - 20 mg/dL   Creatinine, Ser 0.66 0.44 - 1.00 mg/dL   Calcium 9.4 8.9 - 10.3 mg/dL   Total Protein 7.3 6.5 - 8.1 g/dL   Albumin 3.8 3.5 - 5.0 g/dL   AST 36 15 - 41 U/L   ALT 12 (L) 14 - 54 U/L   Alkaline Phosphatase 93 38 - 126 U/L   Total Bilirubin 1.6 (H) 0.3 - 1.2 mg/dL   GFR calc non Af Amer >60 >60 mL/min   GFR calc Af Amer >60 >60 mL/min    Comment: (NOTE) The eGFR has been calculated using the CKD EPI equation. This calculation has not been validated in all clinical situations. eGFR's persistently <60 mL/min signify possible Chronic Kidney Disease.    Anion gap 12 5 - 15  CBC with Differential     Status: None   Collection Time: 07/15/17  4:17 PM  Result Value Ref Range   WBC 10.1 4.0 - 10.5 K/uL   RBC 4.49 3.87 - 5.11 MIL/uL   Hemoglobin 13.2 12.0 - 15.0 g/dL   HCT 39.7 36.0 - 46.0 %   MCV 88.4 78.0 - 100.0 fL   MCH 29.4 26.0 - 34.0 pg   MCHC 33.2 30.0 - 36.0 g/dL   RDW 14.1 11.5 - 15.5 %   Platelets 260 150 - 400 K/uL   Neutrophils Relative % 68 %   Neutro Abs 6.8 1.7 - 7.7 K/uL   Lymphocytes Relative 24 %   Lymphs Abs 2.4 0.7 - 4.0 K/uL   Monocytes Relative 8 %   Monocytes Absolute 0.8 0.1 - 1.0 K/uL   Eosinophils Relative 0 %   Eosinophils Absolute 0.0 0.0 - 0.7 K/uL   Basophils Relative 0 %   Basophils Absolute 0.0 0.0 - 0.1 K/uL  Protime-INR     Status: None   Collection Time: 07/15/17  4:17 PM  Result Value Ref Range   Prothrombin Time 13.2 11.4 - 15.2 seconds   INR 1.01   Type and screen [place]     Status: None   Collection Time: 07/15/17  4:17 PM  Result Value Ref Range   ABO/RH(D) O POS     Antibody Screen NEG    Sample Expiration 07/18/2017     Dg Ribs Unilateral W/chest Right  Result Date: 07/15/2017 CLINICAL DATA:  Right rib pain secondary to a fall this morning. EXAM: RIGHT RIBS AND CHEST - 3+ VIEW COMPARISON:  Chest x-ray dated 09/19/2015 and right shoulder CT dated 05/20/2016 FINDINGS: No fracture or other bone lesions are seen involving the ribs. There is no evidence of pneumothorax or pleural effusion. Both lungs are clear. Heart size and mediastinal contours are within normal limits. Thoracolumbar scoliosis. Aortic atherosclerosis. Right total shoulder prosthesis. IMPRESSION: No acute abnormality. Aortic atherosclerosis. Electronically Signed   By: Lorriane Shire M.D.   On: 07/15/2017 10:41   Ct Head Wo Contrast  Result Date: 07/15/2017 CLINICAL DATA:  Ataxia.  Fall.  Head injury EXAM: CT HEAD WITHOUT CONTRAST CT MAXILLOFACIAL WITHOUT CONTRAST TECHNIQUE: Multidetector CT imaging of the head and maxillofacial structures were performed using the standard protocol without intravenous contrast. Multiplanar CT image reconstructions of the maxillofacial structures were also generated. COMPARISON:  CT head 11/19/2012 FINDINGS: CT HEAD FINDINGS Brain: Right hemispheric subdural hematoma. Subfrontal subdural hematoma on the right measures 5 mm. Right temporal subdural hematoma measures 6 mm. Focal tentorial subdural hematoma on the right measures 15 mm in thickness. Posterior interhemispheric subdural hematoma measures approximately 4 mm. Negative for subarachnoid hemorrhage. No midline shift. Generalized atrophy without hydrocephalus. Right parietal craniotomy with encephalomalacia in the right high parietal lobe unchanged. Negative for acute infarct or mass. Vascular: Negative for hyperdense vessel Skull: Right parietal craniectomy and reconstruction. Small frontal osteoma projecting anteriorly. Other: None CT MAXILLOFACIAL FINDINGS Osseous: Negative for facial fracture. Degenerative change  in the cervical spine postop emboli at C1-2, C5-6, C6-7. 3 mm anterolisthesis C5-6 likely due to disc and facet degeneration. Orbits: Normal soft tissues the orbit.  Bilateral lens replacement. Sinuses: Paranasal sinuses clear.  No air-fluid level. Soft tissues: No significant facial soft tissue swelling IMPRESSION: Extensive right hemispheric subdural hematoma, most prominent in the right tentorium. No midline shift. Prior right parietal craniotomy and cranioplasty. Encephalomalacia right parietal lobe Negative for facial fracture. These results were called by telephone at the time of interpretation on 07/15/2017 at 3:50 pm to Dr. Dene Gentry , who verbally acknowledged these results. Electronically Signed   By: Franchot Gallo M.D.   On: 07/15/2017 15:52   Ct Maxillofacial Wo Cm  Result Date: 07/15/2017 CLINICAL DATA:  Ataxia.  Fall.  Head injury EXAM: CT HEAD WITHOUT CONTRAST CT MAXILLOFACIAL WITHOUT CONTRAST TECHNIQUE: Multidetector CT imaging of the head and maxillofacial structures were performed using the standard protocol without intravenous contrast. Multiplanar CT image reconstructions of the maxillofacial structures were also generated. COMPARISON:  CT head 11/19/2012 FINDINGS: CT HEAD FINDINGS Brain: Right hemispheric subdural hematoma. Subfrontal subdural hematoma on the right measures 5 mm. Right temporal subdural hematoma measures 6 mm. Focal tentorial subdural hematoma on the right measures 15 mm in thickness. Posterior interhemispheric subdural hematoma measures approximately 4 mm. Negative for subarachnoid hemorrhage. No midline shift. Generalized atrophy without hydrocephalus. Right parietal craniotomy with encephalomalacia in the right high parietal lobe unchanged. Negative for acute infarct or mass. Vascular: Negative for hyperdense vessel Skull: Right parietal craniectomy and reconstruction. Small frontal osteoma projecting anteriorly. Other: None CT MAXILLOFACIAL FINDINGS Osseous: Negative  for facial fracture. Degenerative change in the cervical spine postop emboli at C1-2, C5-6, C6-7. 3 mm anterolisthesis C5-6 likely due to disc and facet  degeneration. Orbits: Normal soft tissues the orbit.  Bilateral lens replacement. Sinuses: Paranasal sinuses clear.  No air-fluid level. Soft tissues: No significant facial soft tissue swelling IMPRESSION: Extensive right hemispheric subdural hematoma, most prominent in the right tentorium. No midline shift. Prior right parietal craniotomy and cranioplasty. Encephalomalacia right parietal lobe Negative for facial fracture. These results were called by telephone at the time of interpretation on 07/15/2017 at 3:50 pm to Dr. Dene Gentry , who verbally acknowledged these results. Electronically Signed   By: Franchot Gallo M.D.   On: 07/15/2017 15:52    Pertinent items noted in HPI and remainder of comprehensive ROS otherwise negative. Blood pressure 129/70, pulse 63, temperature 98.3 F (36.8 C), temperature source Oral, resp. rate 18, SpO2 95 %. Patient is awake and alert.  She is oriented and appropriate.  Cranial nerve function intact.  Motor and sensory function extremities normal.  Reflexes normal.  Examination head ears eyes nose throat unremarkable except for prior surgery.  Neck is supple.  Airway midline.  Chest and abdomen benign.  Extremities free of major deformity.  Assessment/Plan: Small right convexity subdural hematoma with extension overlying the right tentorium.  Will risk of further bleeding.  Patient anxious for discharge and states that she will leave if she does not get discharged by her medical team this morning.  With that in mind, I do not think a follow-up head CT scan is crucial.  She should call me should she have new problems.  Cooper Render Joangel Vanosdol 07/16/2017, 9:49 AM

## 2017-07-16 NOTE — Evaluation (Signed)
Occupational Therapy Evaluation and Discharge Patient Details Name: [name] MRN: [id number] DOB: [birth year] Today's Date: 07/16/2017    History of Present Illness 83 year old female with past history of right-sided craniectomy and resection of AVM with eventual cranioplasty placement.  Patient presents now after a fall. MRI revealed small right convexity subdural hematoma with extension overlying the right tentorium.     Clinical Impression   Pt reports she was independent with ADL PTA. Currently pt overall min guard for ADL and functional mobility due to balance deficits. Educated pt on home safety and fall prevention; pt verbalized understanding. Pt planning to d/c home alone with intermittent supervision from friends. Pt preparing for d/c today--will sign off from OT standpoint. Please re-consult if needs change. Thank you for this referral.    Follow Up Recommendations  No OT follow up;Supervision - Intermittent    Equipment Recommendations  None recommended by OT    Recommendations for Other Services       Precautions / Restrictions Precautions Precautions: None Restrictions Weight Bearing Restrictions: No      Mobility Bed Mobility Overal bed mobility: Independent                Transfers Overall transfer level: Needs assistance Equipment used: None Transfers: Sit to/from Stand Sit to Stand: Min guard         General transfer comment: for safety; mild unsteadiness but no major LOB    Balance Overall balance assessment: Needs assistance Sitting-balance support: Feet supported;No upper extremity supported Sitting balance-Leahy Scale: Good     Standing balance support: No upper extremity supported;During functional activity Standing balance-Leahy Scale: Good                         ADL either performed or assessed with clinical judgement   ADL Overall ADL's : Needs assistance/impaired Eating/Feeding: Set up;Sitting   Grooming: Min  guard;Standing;Wash/dry hands   Upper Body Bathing: Set up;Sitting   Lower Body Bathing: Min guard;Sit to/from stand   Upper Body Dressing : Set up;Sitting   Lower Body Dressing: Min guard;Sit to/from stand   Toilet Transfer: Min guard;Ambulation;Regular Museum/gallery exhibitions officer and Hygiene: Min guard;Sit to/from stand   Tub/ Shower Transfer: Min guard;Walk-in shower;Ambulation;Grab bars   Functional mobility during ADLs: Min guard General ADL Comments: Educated pt on bracing chest with pillow when coughing or performing bed mobility for pain control, educated on fall prevention and home safety; pt verbalized understanding     Vision   Vision Assessment?: No apparent visual deficits     Perception     Praxis      Pertinent Vitals/Pain Pain Assessment: Faces Faces Pain Scale: Hurts little more Pain Location: chest with deep breath or movement Pain Descriptors / Indicators: Discomfort;Sore Pain Intervention(s): Monitored during session     Hand Dominance Right   Extremity/Trunk Assessment Upper Extremity Assessment Upper Extremity Assessment: Overall WFL for tasks assessed   Lower Extremity Assessment Lower Extremity Assessment: Defer to PT evaluation       Communication Communication Communication: No difficulties   Cognition Arousal/Alertness: Awake/alert Behavior During Therapy: WFL for tasks assessed/performed Overall Cognitive Status: Within Functional Limits for tasks assessed                                     General Comments       Exercises     Shoulder  Instructions      Home Living Family/patient expects to be discharged to:: Private residence Living Arrangements: Alone Available Help at Discharge: Neighbor;Available PRN/intermittently Type of Home: House Home Access: Stairs to enter;Level entry     Home Layout: One level     Bathroom Shower/Tub: Occupational psychologist: Handicapped height      Home Equipment: Environmental consultant - 2 wheels;Cane - single point;Shower seat;Grab bars - tub/shower          Prior Functioning/Environment Level of Independence: Independent        Comments: independent with ADL, iADLs, community ambulator, driver        OT Problem List:        OT Treatment/Interventions:      OT Goals(Current goals can be found in the care plan section) Acute Rehab OT Goals Patient Stated Goal: go home now OT Goal Formulation: All assessment and education complete, DC therapy  OT Frequency:     Barriers to D/C:            Co-evaluation              AM-PAC PT "6 Clicks" Daily Activity     Outcome Measure Help from another person eating meals?: None Help from another person taking care of personal grooming?: A Little Help from another person toileting, which includes using toliet, bedpan, or urinal?: A Little Help from another person bathing (including washing, rinsing, drying)?: A Little Help from another person to put on and taking off regular upper body clothing?: None Help from another person to put on and taking off regular lower body clothing?: A Little 6 Click Score: 20   End of Session    Activity Tolerance: Patient tolerated treatment well Patient left: in bed;with call bell/phone within reach;with family/visitor present;with bed alarm set  OT Visit Diagnosis: Unsteadiness on feet (R26.81)                Time: 3419-3790 OT Time Calculation (min): 14 min Charges:  OT General Charges $OT Visit: 1 Visit OT Evaluation $OT Eval Moderate Complexity: 1 Mod G-Codes: OT G-codes **NOT FOR INPATIENT CLASS** Functional Assessment Tool Used: Clinical judgement Functional Limitation: Self care Self Care Current Status (W4097): At least 1 percent but less than 20 percent impaired, limited or restricted Self Care Goal Status (D5329): At least 1 percent but less than 20 percent impaired, limited or restricted Self Care Discharge Status [id number]): At  least 1 percent but less than 20 percent impaired, limited or restricted   Mel Almond A. Ulice Brilliant, M.S., OTR/L Pager: [place] 07/16/2017, 1:56 PM

## 2017-07-16 NOTE — Care Management Note (Signed)
Case Management Note  Patient Details  Name: [name] MRN: [id number] Date of Birth: 11-10-1930  Subjective/Objective:    Pt with subdural hematoma. She is from home alone.                Action/Plan: PT recommending outpatient therapy. CM met with the patient and she would like [facility] on [address]. Orders in Epic and information on the AVS. Patients neighbor to provide transportation home.   Expected Discharge Date:  07/16/17               Expected Discharge Plan:  OP Rehab  In-House Referral:     Discharge planning Services  CM Consult  Post Acute Care Choice:    Choice offered to:     DME Arranged:    DME Agency:     HH Arranged:    HH Agency:     Status of Service:  Completed, signed off  If discussed at [employer] of Stay Meetings, dates discussed:    Additional Comments:  Pollie Friar, RN 07/16/2017, 11:47 AM

## 2017-07-17 ENCOUNTER — Telehealth: Payer: Self-pay | Admitting: Neurology

## 2017-07-17 NOTE — Telephone Encounter (Signed)
Pt called the clinic, she had a fall and was taken to ED 07/15/17 (pls see ED notes). Pt said she was not given any discharge instructions when she left the hospital. Pt also said she is wanting Dr Jannifer Franklin to see her and advise her what the next step would be. Pt said she refused to take any advice from neurologist at the hospital. Pt said she trust Dr Jannifer Franklin only and he knows her. I advised the pt she has not been seen in over 4 years, she would be considered a new patient, advised her she would need to get a referral. She understood and would get PCP to refer her.   FYI

## 2017-07-17 NOTE — Progress Notes (Signed)
Physical Therapy  Previously Omitted G-codes   July 23, 2017 1400  PT G-Codes **NOT FOR INPATIENT CLASS**  Functional Assessment Tool Used AM-PAC 6 Clicks Basic Mobility  Functional Limitation Mobility: Walking and moving around  Mobility: Walking and Moving Around Current Status (F5953) CJ  Mobility: Walking and Moving Around Goal Status (X6728) CI   Benjamine Mola B. Migdalia Dk PT, DPT Acute Rehabilitation  [phone] Pager [phone]

## 2017-07-19 ENCOUNTER — Telehealth: Payer: Self-pay | Admitting: Family Medicine

## 2017-07-19 NOTE — Telephone Encounter (Signed)
**  After Hours/ Emergency Line Call*  Received a call regarding questions about taking NSAIDs.  Endorsing rib pain since discharge from the [facility] 3 days ago.  Denying falls, shortness of breath, cough, wheeze, feelings of syncope or fatigue.  Patient states her pain remains the same since discharge but she took 2 ibuprofen this morning with significant improvement of pain but is concerned because she was told not to take any at discharge.  Recommended that continue taken to ibuprofen in the morning and in the evening given her resolution of pain and adequate kidney function.  Red flags discussed.  Will forward to PCP.  Patient scheduled for [facility] follow-up 07/23/2017.  Harriet [place], [place], PGY-2

## 2017-07-23 ENCOUNTER — Ambulatory Visit: Payer: Self-pay | Admitting: Family Medicine

## 2017-07-24 ENCOUNTER — Other Ambulatory Visit: Payer: Self-pay | Admitting: Family Medicine

## 2017-07-30 ENCOUNTER — Ambulatory Visit: Payer: Self-pay | Admitting: Family Medicine

## 2017-07-31 ENCOUNTER — Ambulatory Visit: Payer: Medicare Other | Attending: Family Medicine | Admitting: Physical Therapy

## 2017-07-31 ENCOUNTER — Encounter: Payer: Self-pay | Admitting: Physical Therapy

## 2017-07-31 DIAGNOSIS — R2681 Unsteadiness on feet: Secondary | ICD-10-CM | POA: Diagnosis not present

## 2017-07-31 DIAGNOSIS — R2689 Other abnormalities of gait and mobility: Secondary | ICD-10-CM | POA: Diagnosis not present

## 2017-07-31 DIAGNOSIS — M6281 Muscle weakness (generalized): Secondary | ICD-10-CM | POA: Insufficient documentation

## 2017-08-01 NOTE — Therapy (Signed)
[facility] [address] [place], [place], [address] Phone: [phone]   Fax:  [phone]  Physical Therapy Evaluation  Patient Details  Name: [name] MRN: [id number] Date of Birth: 1931/01/03 Referring Provider: Dr. Esmond Camper   Encounter Date: 07/31/2017  PT End of Session - 08/01/17 1040    Visit Number  1    Number of Visits  9    Date for PT Re-Evaluation  09/29/17    Authorization Type  Medicare/Medicaid    Authorization Time Period  07-31-17 - 09-29-17    PT Start Time  1316    PT Stop Time  1405    PT Time Calculation (min)  49 min       Past Medical History:  Diagnosis Date  . Anemia   . Asthma    seasonal    . AVM (arteriovenous malformation)   . Bipolar disorder ([place])   . Cancer (HCC)     right leg  skin  . Depression    sucide attempt in the distant past  . Excessive sweating   . GI bleed   . Hiatal hernia    s/p repair  . HLD (hyperlipidemia)   . HTN (hypertension)   . Hypertension   . Low back pain    ESI by Dr. Nelva Bush 2 weeks ago  . OA (osteoarthritis)    bilateral wrist, bilateral knees  . Obesity   . Obesity   . Ovarian cyst   . Right wrist injury    multiple surgeries and residual weakness.   . Rotator cuff arthropathy    right  . Seizures ([place]) 1980s   2/2 meningioma. none since cranioplasty  . Shortness of breath dyspnea    if gets excited  . Tuberculosis    + tb test      (father had)    Past Surgical History:  Procedure Laterality Date  . ABDOMINAL HYSTERECTOMY     left ovaries  . BREAST REDUCTION SURGERY    . CATARACT EXTRACTION Bilateral   . CRANIOPLASTY     2/2 meningioma 1980s  . CYSTECTOMY     rt ovary  age 49   . HIATAL HERNIA REPAIR    . KNEE ARTHROPLASTY     bilat  . LEFT HEART CATHETERIZATION WITH CORONARY ANGIOGRAM N/A 03/26/2012   Procedure: LEFT HEART CATHETERIZATION WITH CORONARY ANGIOGRAM;  Surgeon: Minus Breeding, MD;  Location: [facility]  CATH LAB;  Service: Cardiovascular;  Laterality: N/A;  . REVERSE SHOULDER ARTHROPLASTY Right 03/29/2016  . REVERSE SHOULDER ARTHROPLASTY Right 03/29/2016   Procedure: RIGHT REVERSE SHOULDER ARTHROPLASTY;  Surgeon: Netta Cedars, MD;  Location: [facility];  Service: Orthopedics;  Laterality: Right;    There were no vitals filed for this visit.   Subjective Assessment - 08/01/17 1034    Subjective  Pt states she had a fall at home on 07-15-17 and fell about 4:00 AM -states she called neighbor around 9:00 who came and took her to [facility] ED:  reports she is more unsteady now since fall and has a cane that she accidently left in car at today's visit    Pertinent History  seizures due to meningioma (1980's):  Depression:  rotator cuff arthropathy: subdural hematoma due to fall on 07-15-17    Patient Stated Goals  Improve balance    Currently in Pain?  No/denies         [facility] PT Assessment - 08/01/17 0001      Assessment  Medical Diagnosis  Subdural hematoma    Referring Provider  Dr. Esmond Camper    Onset Date/Surgical Date  07/15/17      Precautions   Precautions  Fall      Balance Screen   Has the patient fallen in the past 6 months  Yes    How many times?  2    Has the patient had a decrease in activity level because of a fear of falling?   No    Is the patient reluctant to leave their home because of a fear of falling?   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     Leisure  takes care of 2 dogs      ROM / Strength   AROM / PROM / Electronics engineer      Strength   Overall Strength  Within functional limits for tasks performed      Ambulation/Gait   Ambulation/Gait  Yes    Ambulation/Gait Assistance  6: Modified  independent (Device/Increase time)    Ambulation Distance (Feet)  100 Feet    Assistive device  None    Gait Pattern  Step-through pattern    Ambulation Surface  Level;Indoor    Gait velocity  32.8/15.59 = 2.11 ft/sec with no device    Gait Comments  pt reports she has a cane but left it in car      Standardized Balance Assessment   Standardized Balance Assessment  Berg Balance Test;Timed Up and Go Test      Berg Balance Test   Sit to Stand  Able to stand without using hands and stabilize independently    Standing Unsupported  Able to stand safely 2 minutes    Sitting with Back Unsupported but Feet Supported on Floor or Stool  Able to sit safely and securely 2 minutes    Stand to Sit  Sits safely with minimal use of hands    Transfers  Able to transfer safely, minor use of hands    Standing Unsupported with Eyes Closed  Able to stand 10 seconds with supervision    Standing Ubsupported with Feet Together  Able to place feet together independently and stand for 1 minute with supervision    From Standing, Reach Forward with Outstretched Arm  Can reach forward >12 cm safely (5") 8"    From Standing Position, Pick up Object from [employer] to pick up shoe safely and easily    From Standing Position, Turn to Look Behind Over each Shoulder  Turn sideways only but maintains balance    Turn 360 Degrees  Needs close supervision or verbal cueing    Standing Unsupported, Alternately Place Feet on Step/Stool  Able to complete >2 steps/needs minimal assist    Standing Unsupported, One Foot in Front  Able to plae foot ahead of the other independently and hold 30 seconds    Standing on One Leg  Tries to lift leg/unable to hold 3 seconds but remains standing independently    Total Score  41    Berg comment:  pt very unsteady with  turns      Timed Up and Go Test   TUG  Normal TUG    Normal TUG (seconds)  15.28 no device              Objective measurements completed on examination: See above  findings.              PT Education - 08/01/17 1039    Education provided  Yes    Education Details  eval results - informed pt that she is risk for fall per TUG and Berg - recommend use of cane for safety    Person(s) Educated  Patient    Methods  Explanation;Demonstration    Comprehension  Verbalized understanding          PT Long Term Goals - 08/01/17 1047      PT LONG TERM GOAL #1   Title  Improve Berg score from 41/56 to >/= 46/56 to reduce fall risk.    Baseline  41/56 on 07-31-17    Time  4    Period  Weeks    Status  New    Target Date  08/30/17      PT LONG TERM GOAL #2   Title  Improve TUG score from 15.28 secs to </= 13.5 secs to reduce fall risk.      Baseline  15.28 secs no device on 07-31-17    Time  4    Period  Weeks    Status  New    Target Date  08/30/17      PT LONG TERM GOAL #3   Title  Pt will amb. 300' on even/uneven surfaces with SPC with supervision without LOB.    Time  4    Period  Weeks    Status  New    Target Date  08/30/17      PT LONG TERM GOAL #4   Title  Increase gait velocity from 2.11 ft/sec to >/= 2.6 ft/sec with SPC to increase gait efficiency and safety.    Baseline  15.59 secs with no device    Time  4    Period  Weeks    Status  New    Target Date  08/30/17      PT LONG TERM GOAL #5   Title  Independent in HEP for balance and functional strengthening exs.     Time  4    Period  Weeks    Status  New    Target Date  08/30/17             Plan - 08/01/17 1041    Clinical Impression Statement  Pt is an 83 year old lady with balance deficits due to subdural hematoma sustained in a fall at home on 07-15-17>  Pt was hospitalized from 12-4 until 07-16-17 and discharged home.  Pt is not using an asisstive device but states she has a cane in her car.  PMH includes seizures due to meningioma in 1980's, depression/bipolar disorder (pt denies diagnosis of bipolar disorder), osteoarthritis and bil. TKR's. She is very  unsteady with turns and needs assistaive device to reduce fall risk.     History and Personal Factors relevant to plan of care:  Depression; h/o meningioma/seiaures; bil. TKR's; pt lives alone - has 2 dogs and states she is very involved in political functions    Clinical Presentation  Stable    Clinical Presentation due to:  subdural hematoma due to fall on 07-15-17  Clinical Decision Making  Low    Rehab Potential  Good    Clinical Impairments Affecting Rehab Potential  depression    PT Frequency  2x / week    PT Duration  4 weeks    PT Treatment/Interventions  ADLs/Self Care Home Management;Therapeutic exercise;Therapeutic activities;Stair training;Gait training;DME Instruction;Balance training;Neuromuscular re-education;Patient/family education    PT Next Visit Plan  instruct in balance HEP; practice turning/balance and gait training    PT Home Exercise Plan  balance exercises    Consulted and Agree with Plan of Care  Patient       Patient will benefit from skilled therapeutic intervention in order to improve the following deficits and impairments:  Decreased balance, Decreased safety awareness, Decreased strength, Difficulty walking  Visit Diagnosis: Other abnormalities of gait and mobility - Plan: PT plan of care cert/re-cert  Muscle weakness (generalized) - Plan: PT plan of care cert/re-cert  Unsteadiness on feet - Plan: PT plan of care cert/re-cert  G-Codes - 44/31/54 1055    Functional Assessment Tool Used (Outpatient Only)  TUG score 15.28 secs:  Merrilee Jansky socre 41/56    Functional Limitation  Mobility: Walking and moving around    Mobility: Walking and Moving Around Current Status [phone])  At least 20 percent but less than 40 percent impaired, limited or restricted    Mobility: Walking and Moving Around Goal Status [phone])  At least 1 percent but less than 20 percent impaired, limited or restricted        Problem List Patient Active Problem List   Diagnosis Date Noted  .  Fall   . Subdural hematoma ([place]) 07/15/2017  . Black stool 02/10/2017  . Post-menopausal 04/25/2016  . S/P shoulder replacement 03/29/2016  . Diverticulitis of colon without hemorrhage 12/04/2015  . Rotator cuff syndrome of left shoulder 10/26/2015  . Adhesive capsulitis of right shoulder 10/26/2015  . Cervical disc disorder with radiculopathy of cervical region 10/26/2015  . Excessive sweating, local 10/03/2015  . Heart murmur, systolic 93/26/7124  . Rosacea 09/07/2015  . Adenomatous colon polyp 04/12/2014  . Hearing loss in left ear 04/13/2013  . Obesity 09/12/2012  . NSTEMI (non-ST elevated myocardial infarction) ([place]) 03/25/2012  . Osteopenia 11/13/2011  . ADD (attention deficit disorder) 02/22/2011  . Pure hypercholesterolemia 10/09/2006  . Bipolar I disorder ([place]) 10/09/2006  . Hereditary and idiopathic peripheral neuropathy 10/09/2006  . HYPERTENSION, BENIGN SYSTEMIC 10/09/2006  . OSTEOARTHRITIS, MULTI SITES 10/09/2006  . CONVULSIONS, SEIZURES, NOS 10/09/2006    [name], [place], PT 08/01/2017, 10:57 AM  [facility] [address] [place], [place], [address] Phone: [phone]   Fax:  [phone]  Name: [name] MRN: [id number] Date of Birth: [birth year]

## 2017-08-13 ENCOUNTER — Ambulatory Visit
Admission: RE | Admit: 2017-08-13 | Discharge: 2017-08-13 | Disposition: A | Discharge status: Home / Self Care | Payer: Medicare Other | Source: Ambulatory Visit | Attending: Family Medicine | Admitting: Family Medicine

## 2017-08-13 ENCOUNTER — Other Ambulatory Visit: Payer: Self-pay

## 2017-08-13 ENCOUNTER — Encounter: Payer: Self-pay | Admitting: Family Medicine

## 2017-08-13 ENCOUNTER — Ambulatory Visit (INDEPENDENT_AMBULATORY_CARE_PROVIDER_SITE_OTHER): Payer: Medicare Other | Admitting: Family Medicine

## 2017-08-13 DIAGNOSIS — S065X9A Traumatic subdural hemorrhage with loss of consciousness of unspecified duration, initial encounter: Secondary | ICD-10-CM

## 2017-08-13 DIAGNOSIS — R634 Abnormal weight loss: Secondary | ICD-10-CM | POA: Diagnosis not present

## 2017-08-13 DIAGNOSIS — F339 Major depressive disorder, recurrent, unspecified: Secondary | ICD-10-CM | POA: Diagnosis not present

## 2017-08-13 DIAGNOSIS — S065X0A Traumatic subdural hemorrhage without loss of consciousness, initial encounter: Secondary | ICD-10-CM | POA: Diagnosis not present

## 2017-08-13 DIAGNOSIS — G609 Hereditary and idiopathic neuropathy, unspecified: Secondary | ICD-10-CM

## 2017-08-13 DIAGNOSIS — S065XAA Traumatic subdural hemorrhage with loss of consciousness status unknown, initial encounter: Secondary | ICD-10-CM

## 2017-08-13 NOTE — Patient Instructions (Signed)
I will call with CT scan results.  We can decide on next steps. Make sure you follow up on the physical therapy.

## 2017-08-14 ENCOUNTER — Encounter: Payer: Self-pay | Admitting: Family Medicine

## 2017-08-14 DIAGNOSIS — R634 Abnormal weight loss: Secondary | ICD-10-CM | POA: Insufficient documentation

## 2017-08-14 NOTE — Progress Notes (Signed)
   Subjective:    Patient ID: [name], female    DOB: 12/08/1930, 84 y.o.   MRN: [id number]  HPI FU hospitalization early Dec 2018 when fall and found to have subdural.  Patient left AMA and now returns for the first time for follow up.  She feels not quite right.  She continues to have balance issues.  She is being seen by PT.  No headache.  Has had decreased appetite.  No focal symptoms.  Reviewed labs and x rays from hospitalization - labs surprisingly normal.  No abd pain, just loss of appetite.    She complains of an interesting constellation of symptoms.  These symptoms are clouded by her medical knowledge (former Therapist, sports) and her bipolar disorder.  Her complaints are  Sloshing in her head when she picks her head up.  Loss of feeling in both feet.    Left hand weakness.  Loss of taste - resulting in her forcing herself to eat.      Review of Systems Denies CP, SOB, abd pain, change in bowel, bladder.     Objective:   Physical Exam VS noted including 15-20 lb weight loss.   Neck supple Lungs clear Cardiac RRR without m or g abd benign Ext no edema Neuro gait normal.  No demonstrable weakness.   Psych - a bit on the pressured speech.  Otherwise normal.        Assessment & Plan:

## 2017-08-14 NOTE — Assessment & Plan Note (Signed)
I think her foot problems correspond to her neuropathy.

## 2017-08-14 NOTE — Assessment & Plan Note (Signed)
Ordered prompt CT of head for FU of subdural CT results are reassuring.  She wants to see neuro (Dr. Jannifer Franklin) because she is convinced that these symptoms are coming from a brain problme.

## 2017-08-14 NOTE — Assessment & Plan Note (Signed)
Weight loss is perhaps the most objective and concerning of her symptoms.  Recent labs OK.  Will encourage to eat more and recheck in six weeks.

## 2017-08-14 NOTE — Assessment & Plan Note (Signed)
"  I am not bipolar because I have never been manic"  Wants diagnosis removed.  I still think bipolar and I have seen hypomanic.  Changed dx to major depression.  I don't know how much this depression is contributing to her current symptom complex.

## 2017-09-09 ENCOUNTER — Ambulatory Visit: Payer: Medicare Other | Attending: Family Medicine | Admitting: Physical Therapy

## 2017-09-09 DIAGNOSIS — R2689 Other abnormalities of gait and mobility: Secondary | ICD-10-CM | POA: Diagnosis not present

## 2017-09-09 DIAGNOSIS — R2681 Unsteadiness on feet: Secondary | ICD-10-CM | POA: Diagnosis not present

## 2017-09-09 NOTE — Patient Instructions (Signed)
Standing Marching   Using a chair if necessary, march in place. Repeat 10  times. Do 1 -2 sessions per day.      Hip Backward Kick   Using a chair for balance, keep legs shoulder width apart and toes pointed for- ward. Slowly extend one leg back, keeping knee straight. Do not lean forward. Repeat with other leg. Repeat 10  times. Do 1 sessions per day.  http://gt2.exer.us/340   Copyright  VHI. All rights reserved.     Hip Side Kick   Holding a chair for balance, keep legs shoulder width apart and toes pointed forward. Swing a leg out to side, keeping knee straight. Do not lean. Repeat using other leg. Repeat 10 times. Do 1 sessions per day.  ALSO DO FORWARD KICKS - 10 reps each leg;  Alternate lifting each leg forward      Feet Heel-Toe "Tandem", Varied Arm Positions - Eyes Open   With eyes open, right foot partially in front of the other, arms out, look straight ahead at a stationary object. Hold  30 seconds.  HOLD ONTO COUNTER OR CHAIR as needed for balance Repeat 2  times per session. Do 1 sessions per day. HOLD 30 secs -each position       Standing On One Leg Without Support .  Stand on one leg in neutral spine without support. Hold 10 seconds. Repeat on other leg. Do 2  Repetitions on each leg       Side-Stepping   Walk to left side with eyes open. Take even steps, leading with same foot. Make sure each foot lifts off the floor. Repeat in opposite direction. Repeat for 1 minutes per session. Do 1-2 sessions per day.

## 2017-09-10 ENCOUNTER — Encounter: Payer: Self-pay | Admitting: Physical Therapy

## 2017-09-10 NOTE — Therapy (Signed)
[facility] [address] [place], [place], [address] Phone: [phone]   Fax:  [phone]  Physical Therapy Treatment  Patient Details  Name: [name] MRN: [id number] Date of Birth: 05-13-1931 Referring Provider: Dr. Esmond Camper   Encounter Date: 09/09/2017  PT End of Session - 09/10/17 1131    Visit Number  2    Number of Visits  9    Date for PT Re-Evaluation  09/29/17    Authorization Type  Medicare/Medicaid    Authorization Time Period  07-31-17 - 09-29-17    PT Start Time  1316    PT Stop Time  1401    PT Time Calculation (min)  45 min       Past Medical History:  Diagnosis Date  . Anemia   . Asthma    seasonal    . AVM (arteriovenous malformation)   . Bipolar disorder ([place])   . Cancer (HCC)     right leg  skin  . Depression    sucide attempt in the distant past  . Excessive sweating   . GI bleed   . Hiatal hernia    s/p repair  . HLD (hyperlipidemia)   . HTN (hypertension)   . Hypertension   . Low back pain    ESI by Dr. Nelva Bush 2 weeks ago  . OA (osteoarthritis)    bilateral wrist, bilateral knees  . Obesity   . Obesity   . Ovarian cyst   . Right wrist injury    multiple surgeries and residual weakness.   . Rotator cuff arthropathy    right  . Seizures ([place]) 1980s   2/2 meningioma. none since cranioplasty  . Shortness of breath dyspnea    if gets excited  . Tuberculosis    + tb test      (father had)    Past Surgical History:  Procedure Laterality Date  . ABDOMINAL HYSTERECTOMY     left ovaries  . BREAST REDUCTION SURGERY    . CATARACT EXTRACTION Bilateral   . CRANIOPLASTY     2/2 meningioma 1980s  . CYSTECTOMY     rt ovary  age 22   . HIATAL HERNIA REPAIR    . KNEE ARTHROPLASTY     bilat  . LEFT HEART CATHETERIZATION WITH CORONARY ANGIOGRAM N/A 03/26/2012   Procedure: LEFT HEART CATHETERIZATION WITH CORONARY ANGIOGRAM;  Surgeon: Minus Breeding, MD;  Location: [facility]  CATH LAB;  Service: Cardiovascular;  Laterality: N/A;  . REVERSE SHOULDER ARTHROPLASTY Right 03/29/2016  . REVERSE SHOULDER ARTHROPLASTY Right 03/29/2016   Procedure: RIGHT REVERSE SHOULDER ARTHROPLASTY;  Surgeon: Netta Cedars, MD;  Location: [facility];  Service: Orthopedics;  Laterality: Right;    There were no vitals filed for this visit.  Subjective Assessment - 09/10/17 1128    Subjective  Pt fell backwards this morning - states she lost her balance and fell back into full laundry basket but did not get hurt - has a problem with turns; pt states overall she is doing much better than she was at time of initial eval a month ago pt doesn't think she needs PT now    Pertinent History  seizures due to meningioma (1980's):  Depression:  rotator cuff arthropathy: subdural hematoma due to fall on 07-15-17    Patient Stated Goals  Improve balance    Currently in Pain?  No/denies         [facility] PT Assessment - 09/09/17 1317  Berg Balance Test   Sit to Stand  Able to stand without using hands and stabilize independently    Standing Unsupported  Able to stand safely 2 minutes    Sitting with Back Unsupported but Feet Supported on Floor or Stool  Able to sit safely and securely 2 minutes    Stand to Sit  Sits safely with minimal use of hands    Transfers  Able to transfer safely, minor use of hands    Standing Unsupported with Eyes Closed  Able to stand 10 seconds with supervision    Standing Ubsupported with Feet Together  Able to place feet together independently and stand for 1 minute with supervision    From Standing, Reach Forward with Outstretched Arm  Can reach forward >12 cm safely (5")    From Standing Position, Pick up Object from [place] to pick up shoe safely and easily    From Standing Position, Turn to Look Behind Over each Shoulder  Turn sideways only but maintains balance    Turn 360 Degrees  Able to turn 360 degrees safely one side only in 4 seconds or less    Standing  Unsupported, Alternately Place Feet on Step/Stool  Able to complete >2 steps/needs minimal assist    Standing Unsupported, One Foot in Front  Able to plae foot ahead of the other independently and hold 30 seconds    Standing on One Leg  Tries to lift leg/unable to hold 3 seconds but remains standing independently    Total Score  43      Timed Up and Go Test   TUG  Normal TUG    Normal TUG (seconds)  12.59       Pt instructed in HEP for balance - pt performed standing forward, back and side kicks 10 reps each with UE support  For safety; marching in place 10 reps each leg with UE support  Partial tandem stance and SLS with UE support - see pt instructions for details                   PT Education - 09/10/17 1130    Education provided  Yes    Education Details  Balance HEP    Person(s) Educated  Patient    Methods  Explanation;Demonstration;Handout    Comprehension  Verbalized understanding;Returned demonstration          PT Long Term Goals - 08/01/17 1047      PT LONG TERM GOAL #1   Title  Improve Berg score from 41/56 to >/= 46/56 to reduce fall risk.    Baseline  41/56 on 07-31-17    Time  4    Period  Weeks    Status  New    Target Date  08/30/17      PT LONG TERM GOAL #2   Title  Improve TUG score from 15.28 secs to </= 13.5 secs to reduce fall risk.      Baseline  15.28 secs no device on 07-31-17    Time  4    Period  Weeks    Status  New    Target Date  08/30/17      PT LONG TERM GOAL #3   Title  Pt will amb. 300' on even/uneven surfaces with SPC with supervision without LOB.    Time  4    Period  Weeks    Status  New    Target Date  08/30/17  PT LONG TERM GOAL #4   Title  Increase gait velocity from 2.11 ft/sec to >/= 2.6 ft/sec with SPC to increase gait efficiency and safety.    Baseline  15.59 secs with no device    Time  4    Period  Weeks    Status  New    Target Date  08/30/17      PT LONG TERM GOAL #5   Title  Independent  in HEP for balance and functional strengthening exs.     Time  4    Period  Weeks    Status  New    Target Date  08/30/17            Plan - 09/10/17 1134    Clinical Impression Statement  Pt's Berg score and TUG have improved since initial eval 1 month ago; pt continues to have unsteadiness with turns and with high level balance skills but declines use of cane at this time.  Pt does not feel that ongoing PT is needed at this time - states she is doing well and is able to function safely in her home.    Rehab Potential  Good    Clinical Impairments Affecting Rehab Potential  depression    PT Frequency  2x / week    PT Duration  4 weeks    PT Treatment/Interventions  ADLs/Self Care Home Management;Therapeutic exercise;Therapeutic activities;Stair training;Gait training;DME Instruction;Balance training;Neuromuscular re-education;Patient/family education    PT Next Visit Plan  check Balance HEP and D/C    PT Home Exercise Plan  balance exercises    Consulted and Agree with Plan of Care  Patient       Patient will benefit from skilled therapeutic intervention in order to improve the following deficits and impairments:  Decreased balance, Decreased safety awareness, Decreased strength, Difficulty walking  Visit Diagnosis: Other abnormalities of gait and mobility  Unsteadiness on feet     Problem List Patient Active Problem List   Diagnosis Date Noted  . Weight loss, non-intentional 08/14/2017  . Fall   . Subdural hematoma ([place]) 07/15/2017  . Black stool 02/10/2017  . Post-menopausal 04/25/2016  . S/P shoulder replacement 03/29/2016  . Diverticulitis of colon without hemorrhage 12/04/2015  . Rotator cuff syndrome of left shoulder 10/26/2015  . Adhesive capsulitis of right shoulder 10/26/2015  . Cervical disc disorder with radiculopathy of cervical region 10/26/2015  . Excessive sweating, local 10/03/2015  . Heart murmur, systolic 37/16/9678  . Rosacea 09/07/2015  .  Adenomatous colon polyp 04/12/2014  . Hearing loss in left ear 04/13/2013  . Obesity 09/12/2012  . NSTEMI (non-ST elevated myocardial infarction) ([place]) 03/25/2012  . Osteopenia 11/13/2011  . ADD (attention deficit disorder) 02/22/2011  . Pure hypercholesterolemia 10/09/2006  . Major depression, recurrent, chronic ([place]) 10/09/2006  . Hereditary and idiopathic peripheral neuropathy 10/09/2006  . HYPERTENSION, BENIGN SYSTEMIC 10/09/2006  . OSTEOARTHRITIS, MULTI SITES 10/09/2006  . CONVULSIONS, SEIZURES, NOS 10/09/2006    Alesana Magistro, [place], PT 09/10/2017, 11:37 AM  [facility] [address] [place], [place], [address] Phone: [phone]   Fax:  [phone]  Name: [name] MRN: [id number] Date of Birth: [birth year]

## 2017-09-11 ENCOUNTER — Ambulatory Visit: Payer: Self-pay | Admitting: Physical Therapy

## 2017-09-16 ENCOUNTER — Ambulatory Visit: Payer: Self-pay | Admitting: Physical Therapy

## 2017-09-18 ENCOUNTER — Ambulatory Visit: Payer: Self-pay | Admitting: Physical Therapy

## 2017-09-23 ENCOUNTER — Ambulatory Visit: Payer: Self-pay | Admitting: Physical Therapy

## 2017-09-25 ENCOUNTER — Telehealth: Payer: Self-pay

## 2017-09-25 ENCOUNTER — Ambulatory Visit: Payer: Self-pay | Admitting: Physical Therapy

## 2017-09-25 DIAGNOSIS — M5010 Cervical disc disorder with radiculopathy, unspecified cervical region: Secondary | ICD-10-CM

## 2017-09-25 MED ORDER — GABAPENTIN 600 MG PO TABS: 600.0000 mg | ORAL_TABLET | Freq: Two times a day (BID) | ORAL | 3 refills | Status: DC

## 2017-09-25 NOTE — Addendum Note (Signed)
Addended by: [name] on: 09/25/2017 02:29 PM   Modules accepted: Orders

## 2017-09-25 NOTE — Telephone Encounter (Signed)
Patient left message on nurse line wanting to discuss increasing Gabapentin dose with PCP.   States she would like to take 600 mg in the AM and then 300 mg at night with her Ibuprofen she regularly takes.   States she wakes up daily with soreness and she has researched and tried taking the dose of 600 mg in the AM and it seems to help.  Danley Danker, RN [facility] RN)

## 2017-09-30 ENCOUNTER — Ambulatory Visit: Payer: Self-pay | Admitting: Physical Therapy

## 2017-10-02 ENCOUNTER — Ambulatory Visit: Payer: Self-pay | Admitting: Physical Therapy

## 2017-10-09 ENCOUNTER — Ambulatory Visit: Payer: Medicare Other | Admitting: Physical Therapy

## 2017-10-10 ENCOUNTER — Telehealth: Payer: Self-pay | Admitting: Family Medicine

## 2017-10-10 NOTE — Telephone Encounter (Signed)
Called and informed that I need to see.  Has appointment on March 7.

## 2017-10-10 NOTE — Telephone Encounter (Signed)
Pt called and made an appointment but wasn't sure if the doctor wanted to see her to get a medication refill or just follow up on her fall and weight loss. Please call patient if the appointment is not needed and she can come and pick up her medication. jw

## 2017-10-15 DIAGNOSIS — H40023 Open angle with borderline findings, high risk, bilateral: Secondary | ICD-10-CM | POA: Diagnosis not present

## 2017-10-16 ENCOUNTER — Encounter: Payer: Self-pay | Admitting: Family Medicine

## 2017-10-16 ENCOUNTER — Other Ambulatory Visit: Payer: Self-pay

## 2017-10-16 ENCOUNTER — Ambulatory Visit (INDEPENDENT_AMBULATORY_CARE_PROVIDER_SITE_OTHER): Payer: Medicare Other | Admitting: Family Medicine

## 2017-10-16 DIAGNOSIS — I10 Essential (primary) hypertension: Secondary | ICD-10-CM

## 2017-10-16 DIAGNOSIS — R634 Abnormal weight loss: Secondary | ICD-10-CM | POA: Diagnosis not present

## 2017-10-16 DIAGNOSIS — W19XXXA Unspecified fall, initial encounter: Secondary | ICD-10-CM

## 2017-10-16 DIAGNOSIS — F339 Major depressive disorder, recurrent, unspecified: Secondary | ICD-10-CM | POA: Diagnosis not present

## 2017-10-16 DIAGNOSIS — F908 Attention-deficit hyperactivity disorder, other type: Secondary | ICD-10-CM | POA: Diagnosis not present

## 2017-10-16 DIAGNOSIS — F319 Bipolar disorder, unspecified: Secondary | ICD-10-CM

## 2017-10-16 LAB — POCT SEDIMENTATION RATE: POCT SED RATE: 38 mm/h — AB (ref 0–22)

## 2017-10-16 MED ORDER — AMPHETAMINE-DEXTROAMPHETAMINE 10 MG PO TABS: 10.0000 mg | ORAL_TABLET | Freq: Every day | ORAL | 0 refills | Status: DC

## 2017-10-16 MED ORDER — AMLODIPINE BESYLATE 5 MG PO TABS
5.0000 mg | ORAL_TABLET | Freq: Every day | ORAL | 3 refills | Status: DC
Start: 2017-10-16 — End: 2017-10-21

## 2017-10-16 NOTE — Patient Instructions (Signed)
See me in 6 weeks for a weight check. I will call tomorrow with the lab results.

## 2017-10-17 ENCOUNTER — Encounter: Payer: Self-pay | Admitting: Family Medicine

## 2017-10-17 LAB — CMP14+EGFR
ALBUMIN: 4.1 g/dL (ref 3.5–4.7)
ALT: 11 IU/L (ref 0–32)
AST: 19 IU/L (ref 0–40)
Albumin/Globulin Ratio: 1.5 (ref 1.2–2.2)
Alkaline Phosphatase: 119 IU/L — ABNORMAL HIGH (ref 39–117)
BUN / CREAT RATIO: 14 (ref 12–28)
BUN: 9 mg/dL (ref 8–27)
Bilirubin Total: 0.6 mg/dL (ref 0.0–1.2)
CO2: 24 mmol/L (ref 20–29)
CREATININE: 0.65 mg/dL (ref 0.57–1.00)
Calcium: 9.3 mg/dL (ref 8.7–10.3)
Chloride: 98 mmol/L (ref 96–106)
GFR calc non Af Amer: 82 mL/min/{1.73_m2} (ref >59)
GFR, EST AFRICAN AMERICAN: 94 mL/min/{1.73_m2} (ref >59)
GLUCOSE: 99 mg/dL (ref 65–99)
Globulin, Total: 2.7 g/dL (ref 1.5–4.5)
Potassium: 4.5 mmol/L (ref 3.5–5.2)
Sodium: 139 mmol/L (ref 134–144)
TOTAL PROTEIN: 6.8 g/dL (ref 6.0–8.5)

## 2017-10-17 LAB — TSH: TSH: 1.01 u[IU]/mL (ref 0.450–4.500)

## 2017-10-17 NOTE — Assessment & Plan Note (Signed)
No change.  I am more focused on wt loss at present.

## 2017-10-17 NOTE — Assessment & Plan Note (Signed)
Not syncope.Mechanical fall.  No injury.  No further WU

## 2017-10-17 NOTE — Assessment & Plan Note (Signed)
Stable, no change

## 2017-10-17 NOTE — Assessment & Plan Note (Signed)
Wt continues to drift down in patient who is mostly asymptomatic.  The rate of wt loss seems to have slowed.  Repeat some labs.  FU 4-6 weeks.

## 2017-10-17 NOTE — Assessment & Plan Note (Signed)
Good control.  Refill adderal.

## 2017-10-17 NOTE — Progress Notes (Signed)
   Subjective:    Patient ID: [name], female    DOB: Dec 11, 1930, 84 y.o.   MRN: [id number]  HPI [name] comes in with a host of issues. 1. Most concerning is involuntary wt loss.  We noticed this last visit.  Her only symptom is loss of taste/smell since concusion.  States her appetite is normal.  No change in stool or bleeding.  Generally feels fine. 2. Wants refill on adderall.  Continues to do well. 3. HBP asymptomatic.  4. Bipolar seems under control.  She seems her normal high energy self.   5. Likewise her depression seems under good control. 9. Fell yesterday.  She has a chronically weak right ankle and it twisted on her.  The fall was NOT due to syncope.    Review of Systems     Objective:   Physical ExamVS noted Lungs clear Cardiac RRR without m or g Abd benign. Ext no edema.        Assessment & Plan:

## 2017-10-20 ENCOUNTER — Telehealth: Payer: Self-pay

## 2017-10-20 NOTE — Telephone Encounter (Signed)
Pt called nurse line, states she has stopped taking norvasc, was causing her headaches and she also feels it made her depressed. Wants to go back on clonidine- requesting Rx be sent to [facility]. Pt call back [phone] Wallace Cullens, RN

## 2017-10-21 MED ORDER — CLONIDINE 0.2 MG/24HR TD PTWK: 0.2000 mg | MEDICATED_PATCH | TRANSDERMAL | 12 refills | Status: DC

## 2017-10-21 NOTE — Telephone Encounter (Signed)
Done per patient request 

## 2017-10-22 ENCOUNTER — Telehealth: Payer: Self-pay | Admitting: *Deleted

## 2017-10-22 DIAGNOSIS — I10 Essential (primary) hypertension: Secondary | ICD-10-CM

## 2017-10-22 MED ORDER — CLONIDINE 0.3 MG/24HR TD PTWK: 0.3000 mg | MEDICATED_PATCH | TRANSDERMAL | 12 refills | Status: DC

## 2017-10-22 NOTE — Addendum Note (Signed)
Addended by: [name] on: 10/22/2017 03:12 PM   Modules accepted: Orders

## 2017-10-22 NOTE — Telephone Encounter (Signed)
Message left on clinic nurse voice mail - patient requesting increased strength for clonidine patch to 0.3mg .  Rx for 0.2 mg sent to pharmacy, but patient states med was supposed to be increased due to not being able to tolerate Norvasc and her BP was still elevated.  Will route note to Dr. Andria Frames and call patient back.    Burna Forts, BSN, RN-BC

## 2017-10-22 NOTE — Telephone Encounter (Signed)
Called and discussed.  She will monitor BP to guard against over treatment.  Also aware to be careful of orthostatics.

## 2017-10-24 ENCOUNTER — Telehealth: Payer: Self-pay | Admitting: Neurology

## 2017-10-24 NOTE — Telephone Encounter (Signed)
Called patient to remind her about her appointment on 10/28/17. Patient requested I tell Dr. Jannifer Franklin that she fell last night and hit the right side of her head on the door frame in her home. She states she is fine now, she is just sore from the fall. She states she would like to discuss this with Dr. Jannifer Franklin at her appointment.

## 2017-10-28 ENCOUNTER — Ambulatory Visit (INDEPENDENT_AMBULATORY_CARE_PROVIDER_SITE_OTHER): Payer: Medicare Other | Admitting: Neurology

## 2017-10-28 ENCOUNTER — Encounter: Payer: Self-pay | Admitting: Neurology

## 2017-10-28 VITALS — BP 157/85 | HR 84 | Ht 65.0 in | Wt 186.0 lb

## 2017-10-28 DIAGNOSIS — S065X9A Traumatic subdural hemorrhage with loss of consciousness of unspecified duration, initial encounter: Secondary | ICD-10-CM | POA: Diagnosis not present

## 2017-10-28 DIAGNOSIS — R269 Unspecified abnormalities of gait and mobility: Secondary | ICD-10-CM | POA: Diagnosis not present

## 2017-10-28 DIAGNOSIS — S065XAA Traumatic subdural hemorrhage with loss of consciousness status unknown, initial encounter: Secondary | ICD-10-CM

## 2017-10-28 NOTE — Progress Notes (Signed)
Reason for visit: Subdural hematoma  [name]  History of present illness:  [name] is an 84 year old right-handed white female with a history of a right brain meningioma resection in the past.  The patient has had some residual numbness involving the left foot following the surgery.  She has had some problems with a chronic gait disorder.  She has had bilateral total knee replacements as well.  She has fallen on several occasions since December 2018.  She fell on 4 December, she sustained a subdural hematoma which resolved by a CT scan done in January 2019.  The patient did not require any surgical intervention.  She has had 2 falls since that time, 1 on 6 March, and another within a week prior to this evaluation.  The patient has a cane and a walker, she usually does not use any assistive device to ambulate.  She denies any significant back pain or pain down the legs or neck pain.  She has not had any significant dizziness, she has not had any further blackouts or seizures.  She does report some urinary incontinence.  She has undergone some physical therapy that was done in February 2019, but she stopped the therapy after a couple sessions secondary to the cost.  She has access to several different exercise programs, but she does not engage in regular exercise.  She reports no problems with headaches or vision changes.  There is some concern about whether she may have glaucoma or not.  She is sent to this office for further evaluation.  Past Medical History:  Diagnosis Date  . Anemia   . Asthma    seasonal    . AVM (arteriovenous malformation)   . Bipolar disorder ([place])   . Cancer (HCC)     right leg  skin  . Depression    sucide attempt in the distant past  . Excessive sweating   . GI bleed   . Hiatal hernia    s/p repair  . HLD (hyperlipidemia)   . HTN (hypertension)   . Hypertension   . Low back pain    ESI by Dr.  Nelva Bush 2 weeks ago  . OA (osteoarthritis)    bilateral wrist, bilateral knees  . Obesity   . Obesity   . Ovarian cyst   . Right wrist injury    multiple surgeries and residual weakness.   . Rotator cuff arthropathy    right  . Seizures ([place]) 1980s   2/2 meningioma. none since cranioplasty  . Shortness of breath dyspnea    if gets excited  . Tuberculosis    + tb test      (father had)    Past Surgical History:  Procedure Laterality Date  . ABDOMINAL HYSTERECTOMY     left ovaries  . BREAST REDUCTION SURGERY    . CATARACT EXTRACTION Bilateral   . CRANIOPLASTY     2/2 meningioma 1980s  . CYSTECTOMY     rt ovary  age 15   . HIATAL HERNIA REPAIR    . KNEE ARTHROPLASTY     bilat  . LEFT HEART CATHETERIZATION WITH CORONARY ANGIOGRAM N/A 03/26/2012   Procedure: LEFT HEART CATHETERIZATION WITH CORONARY ANGIOGRAM;  Surgeon: Minus Breeding, MD;  Location: [facility] CATH LAB;  Service: Cardiovascular;  Laterality: N/A;  . REVERSE SHOULDER ARTHROPLASTY Right 03/29/2016  . REVERSE SHOULDER ARTHROPLASTY Right 03/29/2016   Procedure: RIGHT REVERSE SHOULDER ARTHROPLASTY;  Surgeon:  Netta Cedars, MD;  Location: [place];  Service: Orthopedics;  Laterality: Right;    Family History  Problem Relation Age of Onset  . Alzheimer's disease Mother   . Heart disease Father        MI 60yo  . Heart disease Brother     Social history:  reports that  has never smoked. she has never used smokeless tobacco. She reports that she does not drink alcohol or use drugs.  Medications:  Prior to Admission medications   Medication Sig Start Date End Date Taking? Authorizing Provider  amphetamine-dextroamphetamine (ADDERALL) 10 MG tablet Take 1 tablet (10 mg total) by mouth daily. 10/16/17 10/16/18 Yes Hensel, Jamal Collin, MD  atorvastatin (LIPITOR) 10 MG tablet TAKE 1 TABLET(10 MG) BY MOUTH AT BEDTIME 10/21/16  Yes Hensel, Jamal Collin, MD  cloNIDine (CATAPRES - DOSED IN MG/24 HR) 0.3 mg/24hr patch Place 1 patch (0.3 mg total)  onto the skin once a week. 10/22/17  Yes Hensel, Jamal Collin, MD  Coenzyme Q10 (COQ10) 200 MG CAPS Take 200 mg by mouth daily.    Yes [provider]  FLUoxetine (PROZAC) 40 MG capsule TAKE 1 CAPSULE(40 MG) BY MOUTH DAILY 07/24/17  Yes Hensel, Jamal Collin, MD  gabapentin (NEURONTIN) 600 MG tablet Take 1 tablet (600 mg total) by mouth 2 (two) times daily. 09/25/17  Yes Hensel, Jamal Collin, MD  hydrochlorothiazide (MICROZIDE) 12.5 MG capsule TAKE ONE CAPSULE BY MOUTH EVERY DAY Patient taking differently: Take 12.5 mg by mouth once a day 06/20/17  Yes Hensel, Jamal Collin, MD      Allergies  Allergen Reactions  . Amoxicillin Anaphylaxis  . Penicillins Anaphylaxis and Swelling    From childhood: Has patient had a PCN reaction causing immediate rash, facial/tongue/throat swelling, SOB or lightheadedness with hypotension: Yes Has patient had a PCN reaction causing severe rash involving mucus membranes or skin necrosis: Unk Has patient had a PCN reaction that required hospitalization: No Has patient had a PCN reaction occurring within the last 10 years: No If all of the above answers are "NO", then may proceed with Cephalosporin use. .  . Phenobarbital Other (See Comments)    Reaction not recalled by the patient  . Phenytoin Other (See Comments)    "Makes me feel funny"   . Tape Rash    Please use paper tape    ROS:  Out of a complete 14 system review of symptoms, the patient complains only of the following symptoms, and all other reviewed systems are negative.  Weight loss Hearing loss Change in appetite  Blood pressure (!) 157/85, pulse 84, height 5\' 5"  (1.651 m), weight 186 lb (84.4 kg).  Physical Exam  General: The patient is alert and cooperative at the time of the examination.  The patient is moderately obese.  Eyes: Pupils are equal, round, and reactive to light. Discs are flat bilaterally.  Neck: The neck is supple, no carotid bruits are noted.  Respiratory: The  respiratory examination is clear.  Cardiovascular: The cardiovascular examination reveals a regular rate and rhythm, a grade II/VI systolic ejection murmur is noted in the left lower sternal border.  Skin: Extremities are without significant edema.  Neurologic Exam  Mental status: The patient is alert and oriented x 3 at the time of the examination. The patient has apparent normal recent and remote memory, with an apparently normal attention span and concentration ability.  Cranial nerves: Facial symmetry is present. There is good sensation of the face to pinprick and soft  touch bilaterally. The strength of the facial muscles and the muscles to head turning and shoulder shrug are normal bilaterally. Speech is well enunciated, no aphasia or dysarthria is noted. Extraocular movements are full. Visual fields are full. The tongue is midline, and the patient has symmetric elevation of the soft palate. No obvious hearing deficits are noted.  Motor: The motor testing reveals 5 over 5 strength of all 4 extremities. Good symmetric motor tone is noted throughout.  Sensory: Sensory testing is intact to pinprick, soft touch, vibration sensation, and position sense on all 4 extremities, with exception some decreased pinprick sensation on the left foot and decreased vibration and position sense on the left foot. No evidence of extinction is noted.  Coordination: Cerebellar testing reveals good finger-nose-finger and heel-to-shin bilaterally.  Gait and station: Gait is minimally wide-based, the patient has good arm swing, she is able to ambulate independently.  The patient is unable to perform tandem gait.  Romberg is negative. No drift is seen.  Reflexes: Deep tendon reflexes are symmetric, but are slightly depressed bilaterally. Toes are downgoing bilaterally.   CT head 08/13/17:  IMPRESSION: Resolution of RIGHT-side subdural hematoma seen on the previous exam.  Atrophy with small vessel chronic  ischemic changes of deep cerebral white matter.  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  No new intracranial abnormalities.  * CT scan images were reviewed online. I agree with the written report.    Assessment/Plan:  1.  Subdural hematoma, resolved  2.  Gait disorder, multiple falls  The patient does have some gait instability issues.  I have encouraged her to engage in regular exercise, and consider yoga or Pilates to work on balance.  She indicates that she does have access to these programs for free.  She is not willing to go back to physical therapy.  The patient will follow-up through this office if needed.  There is no indication for any neurosurgical intervention at this time.  Donna Alexanders MD 10/28/2017 11:26 AM  [facility] [address] [place], [place] [address]  Phone [phone] Fax [phone]

## 2017-11-05 ENCOUNTER — Ambulatory Visit: Payer: Self-pay | Admitting: Family Medicine

## 2017-12-24 DIAGNOSIS — M25812 Other specified joint disorders, left shoulder: Secondary | ICD-10-CM | POA: Diagnosis not present

## 2017-12-24 DIAGNOSIS — M19012 Primary osteoarthritis, left shoulder: Secondary | ICD-10-CM | POA: Diagnosis not present

## 2017-12-25 ENCOUNTER — Other Ambulatory Visit: Payer: Self-pay

## 2017-12-25 ENCOUNTER — Ambulatory Visit (HOSPITAL_COMMUNITY)
Admission: RE | Admit: 2017-12-25 | Discharge: 2017-12-25 | Disposition: A | Discharge status: Home / Self Care | Payer: Medicare Other | Source: Ambulatory Visit | Attending: Family Medicine | Admitting: Family Medicine

## 2017-12-25 ENCOUNTER — Ambulatory Visit (INDEPENDENT_AMBULATORY_CARE_PROVIDER_SITE_OTHER): Payer: Medicare Other | Admitting: Family Medicine

## 2017-12-25 ENCOUNTER — Encounter: Payer: Self-pay | Admitting: Family Medicine

## 2017-12-25 VITALS — BP 138/88 | HR 83 | Temp 97.6°F | Ht 65.0 in | Wt 180.2 lb

## 2017-12-25 DIAGNOSIS — F908 Attention-deficit hyperactivity disorder, other type: Secondary | ICD-10-CM | POA: Diagnosis not present

## 2017-12-25 DIAGNOSIS — I2109 ST elevation (STEMI) myocardial infarction involving other coronary artery of anterior wall: Secondary | ICD-10-CM | POA: Diagnosis not present

## 2017-12-25 DIAGNOSIS — I2510 Atherosclerotic heart disease of native coronary artery without angina pectoris: Secondary | ICD-10-CM | POA: Diagnosis not present

## 2017-12-25 DIAGNOSIS — I444 Left anterior fascicular block: Secondary | ICD-10-CM | POA: Diagnosis not present

## 2017-12-25 DIAGNOSIS — F319 Bipolar disorder, unspecified: Secondary | ICD-10-CM | POA: Diagnosis not present

## 2017-12-25 DIAGNOSIS — R634 Abnormal weight loss: Secondary | ICD-10-CM

## 2017-12-25 DIAGNOSIS — Z01818 Encounter for other preprocedural examination: Secondary | ICD-10-CM | POA: Diagnosis not present

## 2017-12-25 DIAGNOSIS — F339 Major depressive disorder, recurrent, unspecified: Secondary | ICD-10-CM | POA: Diagnosis not present

## 2017-12-25 DIAGNOSIS — R296 Repeated falls: Secondary | ICD-10-CM | POA: Insufficient documentation

## 2017-12-25 DIAGNOSIS — R011 Cardiac murmur, unspecified: Secondary | ICD-10-CM | POA: Diagnosis not present

## 2017-12-25 DIAGNOSIS — I10 Essential (primary) hypertension: Secondary | ICD-10-CM | POA: Diagnosis not present

## 2017-12-25 LAB — POCT SEDIMENTATION RATE: POCT SED RATE: 28 mm/hr — AB (ref 0–22)

## 2017-12-25 MED ORDER — FLUOXETINE HCL 20 MG PO TABS: 20.0000 mg | ORAL_TABLET | Freq: Every day | ORAL | 3 refills | Status: DC

## 2017-12-25 MED ORDER — AMPHETAMINE-DEXTROAMPHETAMINE 10 MG PO TABS: 10.0000 mg | ORAL_TABLET | Freq: Every day | ORAL | 0 refills | Status: DC

## 2017-12-25 NOTE — Assessment & Plan Note (Signed)
Reassuring echo in 07/2016 means no important valvular heart disease

## 2017-12-25 NOTE — Assessment & Plan Note (Signed)
Slow wt loss continues with no red flag symptoms.  She is actually healthier at this wt (BMI=29) and requires less medications.   Will repeat CBC, CMP and sed rate as part of preop labs.

## 2017-12-25 NOTE — Assessment & Plan Note (Addendum)
I cannot find good documentation of any stress test or cath.  EKG is read as Anteroseptal infarct, but not that different from previous EKG (differences easily accounted for by lead placement.) Stopped statin due to weight loss.  I will check lipids.  She is secondary prevention and almost certainly needs ongoing statin.  Called with lab results.  Due to previous NSTMI she is secondary prevention.  She is willing to restart her atorvastatin.

## 2017-12-25 NOTE — Assessment & Plan Note (Signed)
Decrease prozac per patient request.

## 2017-12-25 NOTE — Assessment & Plan Note (Signed)
Seems controled off clonidine.  Given her frequent falls, I believe she will do best if goal systolic BP is <967.

## 2017-12-25 NOTE — Assessment & Plan Note (Signed)
Patient refuses physical therapy.

## 2017-12-25 NOTE — Assessment & Plan Note (Addendum)
Low risk surgery (shoulder arthroplasty.) Two risk factors age 84 and weak CAD hx No further cardiac testing indicated. Labs all OK.

## 2017-12-25 NOTE — Assessment & Plan Note (Signed)
Refill adderall per patient request.

## 2017-12-25 NOTE — Patient Instructions (Addendum)
I think you will be fine for the surgery. I will fill out the forms after I see the blood tests. I will call with the lab test results. Cut the prozac to 20 mg per day. Good luck with the surgery. OK to stay off clonidine and lipitor for now

## 2017-12-25 NOTE — Progress Notes (Signed)
   Subjective:    Patient ID: Burnetta Sabin, female    DOB: [birth year], 84 y.o.   MRN: [id number]  HPI Here for check HBP, preop clearance for left shoulder surgery, and continued wt loss.   Surgical clearance for left shoulder surgery.  For preop clearance.   Age 84.   Old CNS surgery, not CVA, with encephalomalacia crainetomy right post parietal lobe.   Had bump in trops during old hospitalization and was dxed with NSTMI.  No recent cards eval.  No consult, cath or stress test that I can see in Epic.  Hx of heart murmur.  Echo 07/2016 normal valves and systolic function.  Met parameters for grade 1 diastolic dysfunction but no sx of cHF.  Unintended wt loss continues.  Attributes to lack of taste.  No change in bowel, bladder or appetitie.  Denies DOE or abd pain.  Recent labs with normal TSH and CMP (10/2017).  Wt loss has been gradual but significant (50 lbs from peak)  Monitors home BP.  Has stopped her clonidine because BPs running low.  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    Long term balance problem with frequent falls.  Has refused PT in the past.  No recent worsening.  Fall las December resulted in significant facial trauma and a small subdural, which has subsequently resolved.    Depression/bipolar.  Loves her morning adderall.  Wants to try to decrease her prozac.  Interestingly states not depressed but adds that she looks forward to dying.  Very frustrated with the direction of the [place] (eg Trump, gun violence, etc)  No SI or HI.     Review of Systems     Objective:   Physical ExamHEENT normal Neck supple without nodes Lungs clear Cardiac RRR with 2/6 SEM without g Abd benign Ext no edema.        Assessment & Plan:

## 2017-12-25 NOTE — Addendum Note (Signed)
Addended by: [place] Pink on: 12/25/2017 02:59 PM   Modules accepted: Orders

## 2017-12-26 ENCOUNTER — Encounter: Payer: Self-pay | Admitting: Family Medicine

## 2017-12-26 ENCOUNTER — Telehealth: Payer: Self-pay

## 2017-12-26 LAB — CBC
HEMATOCRIT: 39.8 % (ref 34.0–46.6)
Hemoglobin: 12.9 g/dL (ref 11.1–15.9)
MCH: 27.8 pg (ref 26.6–33.0)
MCHC: 32.4 g/dL (ref 31.5–35.7)
MCV: 86 fL (ref 79–97)
PLATELETS: 372 10*3/uL (ref 150–379)
RBC: 4.64 x10E6/uL (ref 3.77–5.28)
RDW: 14.5 % (ref 12.3–15.4)
WBC: 8.5 10*3/uL (ref 3.4–10.8)

## 2017-12-26 LAB — LIPID PANEL
CHOL/HDL RATIO: 3.9 ratio (ref 0.0–4.4)
Cholesterol, Total: 174 mg/dL (ref 100–199)
HDL: 45 mg/dL (ref >39)
LDL Calculated: 107 mg/dL — ABNORMAL HIGH (ref 0–99)
Triglycerides: 112 mg/dL (ref 0–149)
VLDL Cholesterol Cal: 22 mg/dL (ref 5–40)

## 2017-12-26 LAB — CMP14+EGFR
ALK PHOS: 114 IU/L (ref 39–117)
ALT: 14 IU/L (ref 0–32)
AST: 20 IU/L (ref 0–40)
Albumin/Globulin Ratio: 1.3 (ref 1.2–2.2)
Albumin: 3.9 g/dL (ref 3.5–4.7)
BUN/Creatinine Ratio: 22 (ref 12–28)
BUN: 17 mg/dL (ref 8–27)
Bilirubin Total: 0.3 mg/dL (ref 0.0–1.2)
CO2: 22 mmol/L (ref 20–29)
CREATININE: 0.76 mg/dL (ref 0.57–1.00)
Calcium: 9.7 mg/dL (ref 8.7–10.3)
Chloride: 101 mmol/L (ref 96–106)
GFR calc Af Amer: 83 mL/min/{1.73_m2} (ref >59)
GFR calc non Af Amer: 72 mL/min/{1.73_m2} (ref >59)
GLUCOSE: 96 mg/dL (ref 65–99)
Globulin, Total: 3 g/dL (ref 1.5–4.5)
Potassium: 4.6 mmol/L (ref 3.5–5.2)
Sodium: 140 mmol/L (ref 134–144)
Total Protein: 6.9 g/dL (ref 6.0–8.5)

## 2017-12-26 MED ORDER — ATORVASTATIN CALCIUM 10 MG PO TABS: 10.0000 mg | ORAL_TABLET | Freq: Every day | ORAL | 3 refills | Status: DC

## 2017-12-26 NOTE — Addendum Note (Signed)
Addended by: [name] on: 12/26/2017 11:18 AM   Modules accepted: Orders

## 2017-12-26 NOTE — Telephone Encounter (Signed)
Completed PA info in CoverMyMeds for fluoxetine.  Status pending.  Will recheck status in 24 hours. Wallace Cullens, RN

## 2017-12-29 NOTE — Telephone Encounter (Signed)
Noted.  I am surprised that generic fluoxetine requires a PA.

## 2017-12-29 NOTE — Telephone Encounter (Signed)
PA for fluoxetine approved. [facility] notified. Wallace Cullens, RN

## 2018-01-08 DIAGNOSIS — L814 Other melanin hyperpigmentation: Secondary | ICD-10-CM | POA: Diagnosis not present

## 2018-01-08 DIAGNOSIS — L281 Prurigo nodularis: Secondary | ICD-10-CM | POA: Diagnosis not present

## 2018-01-08 DIAGNOSIS — L570 Actinic keratosis: Secondary | ICD-10-CM | POA: Diagnosis not present

## 2018-01-08 DIAGNOSIS — Z85828 Personal history of other malignant neoplasm of skin: Secondary | ICD-10-CM | POA: Diagnosis not present

## 2018-01-08 DIAGNOSIS — L821 Other seborrheic keratosis: Secondary | ICD-10-CM | POA: Diagnosis not present

## 2018-01-08 DIAGNOSIS — L820 Inflamed seborrheic keratosis: Secondary | ICD-10-CM | POA: Diagnosis not present

## 2018-01-08 DIAGNOSIS — D485 Neoplasm of uncertain behavior of skin: Secondary | ICD-10-CM | POA: Diagnosis not present

## 2018-01-15 DIAGNOSIS — Z85828 Personal history of other malignant neoplasm of skin: Secondary | ICD-10-CM | POA: Diagnosis not present

## 2018-01-15 DIAGNOSIS — L570 Actinic keratosis: Secondary | ICD-10-CM | POA: Diagnosis not present

## 2018-02-04 DIAGNOSIS — H401131 Primary open-angle glaucoma, bilateral, mild stage: Secondary | ICD-10-CM | POA: Diagnosis not present

## 2018-02-04 DIAGNOSIS — Z961 Presence of intraocular lens: Secondary | ICD-10-CM | POA: Diagnosis not present

## 2018-03-02 ENCOUNTER — Telehealth: Payer: Self-pay

## 2018-03-02 NOTE — Telephone Encounter (Signed)
Ms Pangilinan calling to request refill of:  Name of Medication(s):  Acyclovir Last date of OV:  12/25/17 Pharmacy:  [facility] [address]   Discussed with patient policy to call pharmacy for future refills.  Also, discussed refills may take up to 48 hours to approve or deny.  Ottis Stain

## 2018-03-03 MED ORDER — ACYCLOVIR 5 % EX OINT: 1.0000 "application " | TOPICAL_OINTMENT | CUTANEOUS | 2 refills | Status: DC

## 2018-03-03 NOTE — Telephone Encounter (Signed)
Will refill.

## 2018-03-03 NOTE — Addendum Note (Signed)
Addended by: [name] on: 03/03/2018 10:02 AM   Modules accepted: Orders

## 2018-03-04 ENCOUNTER — Telehealth: Payer: Self-pay | Admitting: *Deleted

## 2018-03-04 NOTE — Telephone Encounter (Signed)
Noted and appreciate Janett Billow handling.

## 2018-03-04 NOTE — Telephone Encounter (Signed)
Received fax from [facility] on [address] PA needed on acyclovir ointment.  Clinical questions submitted via Cover My Meds.  Approved and pharmacy informed.  Cover My Meds info: Key: [id number]    [name], Salome Spotted, [place]

## 2018-03-18 DIAGNOSIS — H401131 Primary open-angle glaucoma, bilateral, mild stage: Secondary | ICD-10-CM | POA: Diagnosis not present

## 2018-03-23 ENCOUNTER — Telehealth: Payer: Self-pay

## 2018-03-23 DIAGNOSIS — Z961 Presence of intraocular lens: Secondary | ICD-10-CM | POA: Diagnosis not present

## 2018-03-23 DIAGNOSIS — H401131 Primary open-angle glaucoma, bilateral, mild stage: Secondary | ICD-10-CM | POA: Diagnosis not present

## 2018-03-23 DIAGNOSIS — F339 Major depressive disorder, recurrent, unspecified: Secondary | ICD-10-CM

## 2018-03-23 MED ORDER — FLUOXETINE HCL 20 MG PO TABS: 20.0000 mg | ORAL_TABLET | Freq: Every day | ORAL | 3 refills | Status: DC

## 2018-03-23 NOTE — Telephone Encounter (Signed)
Discussed.  She will restart her prozac. Her anxiety symptoms have worsened since she stoped the prozac.  I also got a note from a friend of [name] who feels she was much better on the prozac.  Patient agrees.  And she will make an appointment to see me.

## 2018-03-23 NOTE — Telephone Encounter (Signed)
Patient left message that she is going to call to make appt with PCP for September which is when he wanted to see her back.   She requests something for her anxiety. States she has been very emotional and crying very easily. Also has depression and stated not taking Prozac. Concerned about all the shootings in the country. Has some old Ativan but does not want to take since she does not know how old it is.  States if PCP wants to talk to her it will be later in the day before she can take a call because she has an eye doctor appt at 2.   Call back is [phone]  Danley Danker, RN [facility])

## 2018-04-27 ENCOUNTER — Telehealth: Payer: Self-pay | Admitting: Family Medicine

## 2018-04-28 ENCOUNTER — Ambulatory Visit: Payer: Medicare Other | Admitting: Podiatry

## 2018-05-01 ENCOUNTER — Telehealth: Payer: Self-pay

## 2018-05-01 NOTE — Telephone Encounter (Signed)
Placed this pt back on schedule to hold spot and will confirm with Kennyth Lose on 05/04/18 about the cancellation and will contact pt after this is confirmed. Katharina Caper, Adalie Mand D, [place]

## 2018-05-01 NOTE — Telephone Encounter (Signed)
Pt LVM on nurse line stating she has to have shoulder surgery soon and she may or may not need lab work for this. She will know after her pre op. However, I would assume they would draw them? Pt stated she has an apt with Hensel on 10/1 and wondering if these labs could be done then, she will have the list with her. I did not see where patient has an apt with Hensel? Will forward to team to clarify?

## 2018-05-04 NOTE — Telephone Encounter (Signed)
Contacted pt to confirm if she needed to keep her appointment with Dr. Andria Frames and she said she did not because she is having an appointment on 10/17 to have the labs she needs so I cancelled the appointment on 05/12/18. [name], [name], [place]

## 2018-05-05 ENCOUNTER — Telehealth: Payer: Self-pay | Admitting: Family Medicine

## 2018-05-05 NOTE — Telephone Encounter (Signed)
Called.  Wants to discuss adjusting psych meds.  We agreed that she will come in for an appointment.

## 2018-05-05 NOTE — Telephone Encounter (Signed)
Pt would like for Dr. Andria Frames to call her. She said to let him know that she has some things she would like to discuss but that she does not want to have to make an appointment. She said the best call back number is [phone].

## 2018-05-07 DIAGNOSIS — H401131 Primary open-angle glaucoma, bilateral, mild stage: Secondary | ICD-10-CM | POA: Diagnosis not present

## 2018-05-11 ENCOUNTER — Other Ambulatory Visit: Payer: Self-pay | Admitting: *Deleted

## 2018-05-11 DIAGNOSIS — F319 Bipolar disorder, unspecified: Secondary | ICD-10-CM

## 2018-05-11 DIAGNOSIS — F339 Major depressive disorder, recurrent, unspecified: Secondary | ICD-10-CM

## 2018-05-11 DIAGNOSIS — F908 Attention-deficit hyperactivity disorder, other type: Secondary | ICD-10-CM

## 2018-05-11 MED ORDER — AMPHETAMINE-DEXTROAMPHETAMINE 10 MG PO TABS: 10.0000 mg | ORAL_TABLET | Freq: Every day | ORAL | 0 refills | Status: DC

## 2018-05-12 ENCOUNTER — Ambulatory Visit: Payer: Self-pay | Admitting: Family Medicine

## 2018-05-20 ENCOUNTER — Telehealth: Payer: Self-pay

## 2018-05-20 NOTE — Telephone Encounter (Signed)
Called and discussed.  Patient tripped, struck her head on the door frame.  No loss of conciousness.  Now only soreness where she struck her head.  Also mild discomfort/fullness in the head when she bends over.  No focal neuro sx.  No headaches.  No slowed thinking.  We will observe.  No imaging for now.  Will need to be see promptly for any worsening.

## 2018-05-20 NOTE — Telephone Encounter (Signed)
Pt LVM on nurse line stating she had a fall on Monday night and "violently" bumped her head. Pt stated she didn't lose consciousness and denies any vision changes or dizziness. Pts main concern with the fall is she is having a lot of "soreness" in the area and is having surgery 10/24 and wants to make sure she doesn't have internal head bleeding. Pt stated, "I do not like coming in there, I always catch something." Pt is requesting an MRI. Please advise.

## 2018-05-27 NOTE — Progress Notes (Signed)
No orders in epic, notified Dr. Veverly Fells' office.

## 2018-05-27 NOTE — Pre-Procedure Instructions (Addendum)
[name]  05/27/2018      [place], [place] [address] [place] [address] Phone: [phone] Fax: [phone]  [facility] [place], [place] [address] Phone: [phone] Fax: [phone]    Your procedure is scheduled on Oct. 25  Report to [facility] at 5:30 A.M.  Call this number if you have problems the morning of surgery:  [phone]   Remember:  Do not eat or drink after midnight.      Take these medicines the morning of surgery with A SIP OF WATER :             Fluoxetine (prozac)             Gabapentin (neurontin)                     7 days prior to surgery STOP taking any Aspirin(unless otherwise instructed by your surgeon), Aleve, Naproxen, Ibuprofen, Motrin, Advil, Goody's, BC's, all herbal medications, fish oil, and all vitamins    Do not wear jewelry, make-up or nail polish.  Do not wear lotions, powders, or perfumes, or deodorant.  Do not shave 48 hours prior to surgery.  Men may shave face and neck.  Do not bring valuables to the hospital.  [facility] is not responsible for any belongings or valuables.  Contacts, dentures or bridgework may not be worn into surgery.  Leave your suitcase in the car.  After surgery it may be brought to your room.  For patients admitted to the hospital, discharge time will be determined by your treatment team.  Patients discharged the day of surgery will not be allowed to drive home.    Special instructions:  [place]- Preparing For Surgery  Before surgery, you can play an important role. Because skin is not sterile, your skin needs to be as free of germs as possible. You can reduce the number of germs on your skin by washing with CHG (chlorahexidine gluconate) Soap before surgery.  CHG is an antiseptic cleaner which kills germs and  bonds with the skin to continue killing germs even after washing.    Oral Hygiene is also important to reduce your risk of infection.  Remember - BRUSH YOUR TEETH THE MORNING OF SURGERY WITH YOUR REGULAR TOOTHPASTE  Please do not use if you have an allergy to CHG or antibacterial soaps. If your skin becomes reddened/irritated stop using the CHG.  Do not shave (including legs and underarms) for at least 48 hours prior to first CHG shower. It is OK to shave your face.  Please follow these instructions carefully.   1. Shower the NIGHT BEFORE SURGERY and the MORNING OF SURGERY with CHG.   2. If you chose to wash your hair, wash your hair first as usual with your normal shampoo.  3. After you shampoo, rinse your hair and body thoroughly to remove the shampoo.  4. Use CHG as you would any other liquid soap. You can apply CHG directly to the skin and wash gently with a scrungie or a clean washcloth.   5. Apply the CHG Soap to your body ONLY FROM THE NECK DOWN.  Do not use on open wounds or open sores. Avoid contact with your eyes, ears, mouth and genitals (private parts). Wash Face and genitals (private parts)  with your normal soap.  6. Wash thoroughly, paying special attention to the area where your surgery will be performed.  7. Thoroughly rinse your body with warm water from the neck down.  8. DO NOT shower/wash with your normal soap after using and rinsing off the CHG Soap.  9. Pat yourself dry with a CLEAN TOWEL.  10. Wear CLEAN PAJAMAS to bed the night before surgery, wear comfortable clothes the morning of surgery  11. Place CLEAN SHEETS on your bed the night of your first shower and DO NOT SLEEP WITH PETS.    Day of Surgery:  Do not apply any deodorants/lotions.  Please wear clean clothes to the hospital/surgery center.   Remember to brush your teeth WITH YOUR REGULAR TOOTHPASTE.    Please read over the following fact sheets that you were given. Coughing and Deep  Breathing, MRSA Information and Surgical Site Infection Prevention

## 2018-05-28 ENCOUNTER — Encounter (HOSPITAL_COMMUNITY)
Admission: RE | Admit: 2018-05-28 | Discharge: 2018-05-28 | Disposition: A | Discharge status: Home / Self Care | Payer: Medicare Other | Source: Ambulatory Visit | Attending: Orthopedic Surgery | Admitting: Orthopedic Surgery

## 2018-05-28 ENCOUNTER — Encounter (HOSPITAL_COMMUNITY): Payer: Self-pay

## 2018-05-28 ENCOUNTER — Other Ambulatory Visit: Payer: Self-pay

## 2018-05-28 DIAGNOSIS — Z01818 Encounter for other preprocedural examination: Secondary | ICD-10-CM | POA: Diagnosis not present

## 2018-05-28 HISTORY — DX: Atherosclerotic heart disease of native coronary artery without angina pectoris: I25.10

## 2018-05-28 NOTE — Progress Notes (Addendum)
CSW consulted by RN expressing that while completing Pre-Admission paperwork with pt, pt expressed having thoughts of harming self. CSW responded to consult. CSW met with pt in pre-admission room where pt was actively walking towards the door to leave. CSW was able to calm pt done a bit and get pt to be agreeable in speaking with CSW. CSW spoke with pt to gather more information on pt's suspect thoughts of harming self. Pt reports that pt was "born this way" and that this "is nothing new". Pt goes on to express to CSW that RN "was asking to many questions and shouldn't have asked anything that she didn't wanna know". CSW attempted to ex[laint opt that here at the hospital before nay one has surgery these questions have to be asked. Pt responded "well that was so long ago and im here for my surgery, but now im canceling it". CSW tried to assist pt in developing the pros and cons to surgey however pt still ready to leave.Pt presented to CSW as very upset, angry, and ready to leave with frustration that RN asked pt questions and got CSW involved. Pt expressed to CSW that pt IS NOT having thoughts of harming self, IS NOT wanting  to harm anyone else, and IS NOT seeing or hearing things that others are unable to see. CSW attempted to assess pt to see where RN would get the idea that pt is suicidal. CSW and RN expressed to pt multiple times to come the ED to be evaluated and pt began walking out of the door. Pt began telling CSW one more that pt was not suicidal, homicidal, or having hallucinations and only wanted to get home to read a book. Pt expressed "please stop asking me questions I dont need anything but to go home". CSW attempt again to have pt become agreeable to be seen in the ED-pt expressed no desire.   CSW spoke with [facility] staff and was informed that if pt denies suicidal ideation, homicidal  ideation, or hallucination  then pt could leave if pt wanted to as hospital staff are unable to make someone stay if  wanting to leave.   CSW aware that pt left in car with no other complaints.    Virgie Dad Montrail Mehrer, MSW, [facility] Clinical Social Worker [phone]

## 2018-05-28 NOTE — H&P (Signed)
Patient's anticipated LOS is less than 2 midnights, meeting these requirements: - Younger than 57 - Lives within 1 hour of care - Has a competent adult at home to recover with post-op recover - NO history of  - Chronic pain requiring opiods  - Diabetes  - Coronary Artery Disease  - Heart failure  - Heart attack  - Stroke  - DVT/VTE  - Cardiac arrhythmia  - Respiratory Failure/COPD  - Renal failure  - Anemia  - Advanced Liver disease       [name] is an 85 y.o. female.    Chief Complaint: left shoulder pain  HPI: Pt is a 84 y.o. female complaining of left shoulder pain for multiple years. Pain had continually increased since the beginning. X-rays in the clinic show end-stage arthritic changes of the left shoulder. Pt has tried various conservative treatments which have failed to alleviate their symptoms, including injections and therapy. Various options are discussed with the patient. Risks, benefits and expectations were discussed with the patient. Patient understand the risks, benefits and expectations and wishes to proceed with surgery.   PCP:  Donna Resides, MD  D/C Plans: Home  PMH: Past Medical History:  Diagnosis Date  . Anemia   . Asthma    once in 1950's  . AVM (arteriovenous malformation)   . Cancer (HCC)     right leg  skin  . Coronary artery disease   . Depression    sucide attempt in the distant past  . Excessive sweating   . GI bleed   . Hiatal hernia    s/p repair  . HLD (hyperlipidemia)   . HTN (hypertension)   . Hypertension   . Low back pain    ESI by Dr. Nelva Bush 2 weeks ago  . OA (osteoarthritis)    bilateral wrist, bilateral knees  . Obesity   . Obesity   . Ovarian cyst   . Right wrist injury    multiple surgeries and residual weakness.   . Rotator cuff arthropathy    right  . Seizures ([place]) 1980s   2/2 meningioma. none since cranioplasty  . Tuberculosis    + tb test      (father had)    PSH: Past Surgical History:    Procedure Laterality Date  . ABDOMINAL HYSTERECTOMY     left ovaries  . BREAST REDUCTION SURGERY    . CATARACT EXTRACTION Bilateral   . CRANIOPLASTY     2/2 meningioma 1980s  . CYSTECTOMY     rt ovary  age 67   . HIATAL HERNIA REPAIR    . KNEE ARTHROPLASTY     bilat  . LEFT HEART CATHETERIZATION WITH CORONARY ANGIOGRAM N/A 03/26/2012   Procedure: LEFT HEART CATHETERIZATION WITH CORONARY ANGIOGRAM;  Surgeon: Minus Breeding, MD;  Location: [facility] CATH LAB;  Service: Cardiovascular;  Laterality: N/A;  . REVERSE SHOULDER ARTHROPLASTY Right 03/29/2016  . REVERSE SHOULDER ARTHROPLASTY Right 03/29/2016   Procedure: RIGHT REVERSE SHOULDER ARTHROPLASTY;  Surgeon: Netta Cedars, MD;  Location: [facility];  Service: Orthopedics;  Laterality: Right;    Social History:  reports that she has never smoked. She has never used smokeless tobacco. She reports that she does not drink alcohol or use drugs.  Allergies:  Allergies  Allergen Reactions  . Amoxicillin Anaphylaxis  . Penicillins Anaphylaxis and Swelling    From childhood: Has patient had a PCN reaction causing immediate rash, facial/tongue/throat swelling, SOB or lightheadedness with hypotension: Yes Has patient had a PCN  reaction causing severe rash involving mucus membranes or skin necrosis: Unk Has patient had a PCN reaction that required hospitalization: No Has patient had a PCN reaction occurring within the last 10 years: No If all of the above answers are "NO", then may proceed with Cephalosporin use. .  . Phenobarbital Other (See Comments)    Reaction not recalled by the patient  . Phenytoin Other (See Comments)    "Makes me feel funny"   . Tape Rash    Please use paper tape    Medications: No current facility-administered medications for this encounter.    Current Outpatient Medications  Medication Sig Dispense Refill  . amphetamine-dextroamphetamine (ADDERALL) 10 MG tablet Take 1 tablet (10 mg total) by mouth daily. 90 tablet 0  .  atorvastatin (LIPITOR) 10 MG tablet Take 1 tablet (10 mg total) by mouth daily. 90 tablet 3  . Azelaic Acid 15 % cream Apply 1 application topically daily.  2  . Coenzyme Q10 (COQ10) 200 MG CAPS Take 200 mg by mouth daily.     Marland Kitchen FLUoxetine (PROZAC) 20 MG tablet Take 1 tablet (20 mg total) by mouth daily. 90 tablet 3  . gabapentin (NEURONTIN) 600 MG tablet Take 1 tablet (600 mg total) by mouth 2 (two) times daily. 180 tablet 3  . hydrochlorothiazide (MICROZIDE) 12.5 MG capsule TAKE ONE CAPSULE BY MOUTH EVERY DAY (Patient taking differently: Take 12.5 mg by mouth once a day) 90 capsule 3  . latanoprost (XALATAN) 0.005 % ophthalmic solution Place 1 drop into both eyes at bedtime.  11  . magnesium 30 MG tablet Take 30 mg by mouth 2 (two) times daily.      No results found for this or any previous visit (from the past 48 hour(s)). No results found.  ROS: Pain with rom of the left upper extremity  Physical Exam: Alert and oriented 84 y.o. female in no acute distress Cranial nerves 2-12 intact Cervical spine: full rom with no tenderness, nv intact distally Chest: active breath sounds bilaterally, no wheeze rhonchi or rales Heart: regular rate and rhythm, no murmur Abd: non tender non distended with active bowel sounds Hip is stable with rom  Left shoulder with limited rom and strength due to arthropathy nv intact distally No rashes or edema distally  Assessment/Plan Assessment: left shoulder cuff arthropathy  Plan:  Patient will undergo a left reverse total shoulder by Dr. Veverly Fells at [facility]. Risks benefits and expectations were discussed with the patient. Patient understand risks, benefits and expectations and wishes to proceed. Preoperative templating of the joint replacement has been completed, documented, and submitted to the Operating Room personnel in order to optimize intra-operative equipment management.   Merla Riches PA-C, MPAS [facility] is now [employer] [address], [place], [place], [place] [address] Phone: [phone] [email or web address] Facebook  [employer]

## 2018-05-28 NOTE — Progress Notes (Addendum)
During pre-op visit, pt. verbalized that she has anxiety and depression. Denies bipolar  Disorder. She stated she has thoughts of harming self.. Stated she wasn't going to hurt herself or others. Stated she has these thoughts frequently, last time was yesterday. She denied any today. I notified social worker, Phineas Real from the ED Department. She came up to speak with pt. Pt. Was offered to go to the emergency room for an evaluation,but she refused. Pt. Stated she wasn't sure about having the surgery and then stated she was going to cancel it.Pt. Stated she was leaving and [place] and I walked with pt.in the hall way, talking with her to return to office and discuss this in more detail. Pt. Refused.

## 2018-06-02 ENCOUNTER — Encounter: Payer: Self-pay | Admitting: Family Medicine

## 2018-06-03 NOTE — Telephone Encounter (Signed)
finished

## 2018-06-05 ENCOUNTER — Inpatient Hospital Stay (HOSPITAL_COMMUNITY): Admission: RE | Admit: 2018-06-05 | Payer: Medicare Other | Source: Ambulatory Visit | Admitting: Orthopedic Surgery

## 2018-06-05 ENCOUNTER — Encounter (HOSPITAL_COMMUNITY): Admission: RE | Payer: Self-pay | Source: Ambulatory Visit

## 2018-06-05 SURGERY — ARTHROPLASTY, SHOULDER, TOTAL, REVERSE
Anesthesia: Choice | Laterality: Left

## 2018-06-11 DIAGNOSIS — M8589 Other specified disorders of bone density and structure, multiple sites: Secondary | ICD-10-CM | POA: Diagnosis not present

## 2018-06-11 DIAGNOSIS — Z23 Encounter for immunization: Secondary | ICD-10-CM | POA: Diagnosis not present

## 2018-06-11 DIAGNOSIS — R634 Abnormal weight loss: Secondary | ICD-10-CM | POA: Diagnosis not present

## 2018-06-11 DIAGNOSIS — Z1231 Encounter for screening mammogram for malignant neoplasm of breast: Secondary | ICD-10-CM | POA: Diagnosis not present

## 2018-06-11 DIAGNOSIS — Z90710 Acquired absence of both cervix and uterus: Secondary | ICD-10-CM | POA: Diagnosis not present

## 2018-06-11 DIAGNOSIS — M810 Age-related osteoporosis without current pathological fracture: Secondary | ICD-10-CM | POA: Diagnosis not present

## 2018-06-11 DIAGNOSIS — Z96653 Presence of artificial knee joint, bilateral: Secondary | ICD-10-CM | POA: Diagnosis not present

## 2018-06-12 ENCOUNTER — Telehealth: Payer: Self-pay | Admitting: Family Medicine

## 2018-06-12 DIAGNOSIS — M810 Age-related osteoporosis without current pathological fracture: Secondary | ICD-10-CM

## 2018-06-12 NOTE — Telephone Encounter (Signed)
New dexa scan shows osteoporosis at left wrist.  Osteopenia at multiple other sites.  Called and left message.  Likely needs bisphosphonate.  Mitigating factor, I vaguely recall a prior left wrist injury which may be contributing.  Need to discuss with patient.

## 2018-06-15 MED ORDER — CALCIUM CARBONATE-VITAMIN D 500-200 MG-UNIT PO TABS: 1.0000 | ORAL_TABLET | Freq: Every day | ORAL | Status: AC

## 2018-06-15 NOTE — Assessment & Plan Note (Signed)
Only osteoporosis is at wrist.  Hips fine.  Discussed with patient.  Does not want to take any more meds.  Will take calcium.  Advised to start.

## 2018-06-18 ENCOUNTER — Telehealth: Payer: Self-pay

## 2018-06-18 NOTE — Telephone Encounter (Signed)
Patient left message calling for mammogram results. Had it at same time as DEXA which she has gotten results of.   Call back is [phone]  Danley Danker, RN [facility])

## 2018-06-18 NOTE — Telephone Encounter (Signed)
Called and informed normal mammogram

## 2018-07-13 ENCOUNTER — Ambulatory Visit: Payer: Medicare Other

## 2018-07-16 ENCOUNTER — Other Ambulatory Visit: Payer: Self-pay

## 2018-07-16 ENCOUNTER — Ambulatory Visit: Payer: Medicare Other | Admitting: Family Medicine

## 2018-07-16 ENCOUNTER — Emergency Department (HOSPITAL_COMMUNITY): Payer: Medicare Other

## 2018-07-16 ENCOUNTER — Inpatient Hospital Stay (HOSPITAL_COMMUNITY)
Admission: EM | Admit: 2018-07-16 | Discharge: 2018-07-23 | DRG: 871 | Disposition: A | Discharge status: Home Health | Payer: Medicare Other | Attending: Family Medicine | Admitting: Family Medicine

## 2018-07-16 DIAGNOSIS — J189 Pneumonia, unspecified organism: Secondary | ICD-10-CM | POA: Diagnosis not present

## 2018-07-16 DIAGNOSIS — Z91048 Other nonmedicinal substance allergy status: Secondary | ICD-10-CM

## 2018-07-16 DIAGNOSIS — I4891 Unspecified atrial fibrillation: Secondary | ICD-10-CM | POA: Diagnosis present

## 2018-07-16 DIAGNOSIS — Z881 Allergy status to other antibiotic agents status: Secondary | ICD-10-CM

## 2018-07-16 DIAGNOSIS — M19032 Primary osteoarthritis, left wrist: Secondary | ICD-10-CM | POA: Diagnosis present

## 2018-07-16 DIAGNOSIS — I071 Rheumatic tricuspid insufficiency: Secondary | ICD-10-CM | POA: Diagnosis present

## 2018-07-16 DIAGNOSIS — A419 Sepsis, unspecified organism: Secondary | ICD-10-CM | POA: Diagnosis not present

## 2018-07-16 DIAGNOSIS — R296 Repeated falls: Secondary | ICD-10-CM | POA: Diagnosis present

## 2018-07-16 DIAGNOSIS — Z85828 Personal history of other malignant neoplasm of skin: Secondary | ICD-10-CM

## 2018-07-16 DIAGNOSIS — R4182 Altered mental status, unspecified: Secondary | ICD-10-CM | POA: Diagnosis not present

## 2018-07-16 DIAGNOSIS — G8929 Other chronic pain: Secondary | ICD-10-CM | POA: Diagnosis present

## 2018-07-16 DIAGNOSIS — E785 Hyperlipidemia, unspecified: Secondary | ICD-10-CM | POA: Diagnosis present

## 2018-07-16 DIAGNOSIS — J9601 Acute respiratory failure with hypoxia: Secondary | ICD-10-CM | POA: Diagnosis not present

## 2018-07-16 DIAGNOSIS — K5732 Diverticulitis of large intestine without perforation or abscess without bleeding: Secondary | ICD-10-CM | POA: Diagnosis present

## 2018-07-16 DIAGNOSIS — R402242 Coma scale, best verbal response, confused conversation, at arrival to emergency department: Secondary | ICD-10-CM | POA: Diagnosis present

## 2018-07-16 DIAGNOSIS — H9192 Unspecified hearing loss, left ear: Secondary | ICD-10-CM | POA: Diagnosis present

## 2018-07-16 DIAGNOSIS — Z820 Family history of epilepsy and other diseases of the nervous system: Secondary | ICD-10-CM

## 2018-07-16 DIAGNOSIS — Z880 Allergy status to penicillin: Secondary | ICD-10-CM

## 2018-07-16 DIAGNOSIS — Z888 Allergy status to other drugs, medicaments and biological substances status: Secondary | ICD-10-CM

## 2018-07-16 DIAGNOSIS — Z86011 Personal history of benign neoplasm of the brain: Secondary | ICD-10-CM

## 2018-07-16 DIAGNOSIS — Z79899 Other long term (current) drug therapy: Secondary | ICD-10-CM

## 2018-07-16 DIAGNOSIS — M5010 Cervical disc disorder with radiculopathy, unspecified cervical region: Secondary | ICD-10-CM | POA: Diagnosis present

## 2018-07-16 DIAGNOSIS — R918 Other nonspecific abnormal finding of lung field: Secondary | ICD-10-CM | POA: Diagnosis not present

## 2018-07-16 DIAGNOSIS — G92 Toxic encephalopathy: Secondary | ICD-10-CM | POA: Diagnosis not present

## 2018-07-16 DIAGNOSIS — E7800 Pure hypercholesterolemia, unspecified: Secondary | ICD-10-CM | POA: Diagnosis present

## 2018-07-16 DIAGNOSIS — I5031 Acute diastolic (congestive) heart failure: Secondary | ICD-10-CM | POA: Diagnosis present

## 2018-07-16 DIAGNOSIS — R402132 Coma scale, eyes open, to sound, at arrival to emergency department: Secondary | ICD-10-CM | POA: Diagnosis present

## 2018-07-16 DIAGNOSIS — I252 Old myocardial infarction: Secondary | ICD-10-CM

## 2018-07-16 DIAGNOSIS — M940 Chondrocostal junction syndrome [Tietze]: Secondary | ICD-10-CM | POA: Diagnosis not present

## 2018-07-16 DIAGNOSIS — M810 Age-related osteoporosis without current pathological fracture: Secondary | ICD-10-CM | POA: Diagnosis present

## 2018-07-16 DIAGNOSIS — R7989 Other specified abnormal findings of blood chemistry: Secondary | ICD-10-CM

## 2018-07-16 DIAGNOSIS — Z8612 Personal history of poliomyelitis: Secondary | ICD-10-CM

## 2018-07-16 DIAGNOSIS — R29810 Facial weakness: Secondary | ICD-10-CM | POA: Diagnosis not present

## 2018-07-16 DIAGNOSIS — F319 Bipolar disorder, unspecified: Secondary | ICD-10-CM | POA: Diagnosis present

## 2018-07-16 DIAGNOSIS — J45909 Unspecified asthma, uncomplicated: Secondary | ICD-10-CM | POA: Diagnosis present

## 2018-07-16 DIAGNOSIS — R4781 Slurred speech: Secondary | ICD-10-CM | POA: Diagnosis not present

## 2018-07-16 DIAGNOSIS — G8194 Hemiplegia, unspecified affecting left nondominant side: Secondary | ICD-10-CM | POA: Diagnosis present

## 2018-07-16 DIAGNOSIS — N390 Urinary tract infection, site not specified: Secondary | ICD-10-CM | POA: Diagnosis present

## 2018-07-16 DIAGNOSIS — I110 Hypertensive heart disease with heart failure: Secondary | ICD-10-CM | POA: Diagnosis present

## 2018-07-16 DIAGNOSIS — G40901 Epilepsy, unspecified, not intractable, with status epilepticus: Secondary | ICD-10-CM | POA: Diagnosis present

## 2018-07-16 DIAGNOSIS — R29818 Other symptoms and signs involving the nervous system: Secondary | ICD-10-CM | POA: Diagnosis present

## 2018-07-16 DIAGNOSIS — G609 Hereditary and idiopathic neuropathy, unspecified: Secondary | ICD-10-CM | POA: Diagnosis present

## 2018-07-16 DIAGNOSIS — E872 Acidosis: Secondary | ICD-10-CM | POA: Diagnosis present

## 2018-07-16 DIAGNOSIS — Z86010 Personal history of colonic polyps: Secondary | ICD-10-CM

## 2018-07-16 DIAGNOSIS — G8384 Todd's paralysis (postepileptic): Secondary | ICD-10-CM | POA: Diagnosis present

## 2018-07-16 DIAGNOSIS — Z96611 Presence of right artificial shoulder joint: Secondary | ICD-10-CM | POA: Diagnosis present

## 2018-07-16 DIAGNOSIS — R404 Transient alteration of awareness: Secondary | ICD-10-CM | POA: Diagnosis not present

## 2018-07-16 DIAGNOSIS — I2510 Atherosclerotic heart disease of native coronary artery without angina pectoris: Secondary | ICD-10-CM | POA: Diagnosis present

## 2018-07-16 DIAGNOSIS — E876 Hypokalemia: Secondary | ICD-10-CM | POA: Diagnosis not present

## 2018-07-16 DIAGNOSIS — M19031 Primary osteoarthritis, right wrist: Secondary | ICD-10-CM | POA: Diagnosis present

## 2018-07-16 DIAGNOSIS — J811 Chronic pulmonary edema: Secondary | ICD-10-CM | POA: Diagnosis not present

## 2018-07-16 DIAGNOSIS — R4020 Unspecified coma: Secondary | ICD-10-CM | POA: Diagnosis not present

## 2018-07-16 DIAGNOSIS — R931 Abnormal findings on diagnostic imaging of heart and coronary circulation: Secondary | ICD-10-CM

## 2018-07-16 DIAGNOSIS — Z8249 Family history of ischemic heart disease and other diseases of the circulatory system: Secondary | ICD-10-CM

## 2018-07-16 DIAGNOSIS — J9602 Acute respiratory failure with hypercapnia: Secondary | ICD-10-CM | POA: Diagnosis present

## 2018-07-16 DIAGNOSIS — Z96653 Presence of artificial knee joint, bilateral: Secondary | ICD-10-CM | POA: Diagnosis present

## 2018-07-16 DIAGNOSIS — E1165 Type 2 diabetes mellitus with hyperglycemia: Secondary | ICD-10-CM | POA: Diagnosis not present

## 2018-07-16 DIAGNOSIS — L719 Rosacea, unspecified: Secondary | ICD-10-CM | POA: Diagnosis present

## 2018-07-16 DIAGNOSIS — R52 Pain, unspecified: Secondary | ICD-10-CM

## 2018-07-16 DIAGNOSIS — Z90710 Acquired absence of both cervix and uterus: Secondary | ICD-10-CM

## 2018-07-16 DIAGNOSIS — R569 Unspecified convulsions: Secondary | ICD-10-CM

## 2018-07-16 DIAGNOSIS — I671 Cerebral aneurysm, nonruptured: Secondary | ICD-10-CM | POA: Diagnosis not present

## 2018-07-16 DIAGNOSIS — Z9842 Cataract extraction status, left eye: Secondary | ICD-10-CM

## 2018-07-16 DIAGNOSIS — R778 Other specified abnormalities of plasma proteins: Secondary | ICD-10-CM

## 2018-07-16 DIAGNOSIS — I499 Cardiac arrhythmia, unspecified: Secondary | ICD-10-CM | POA: Diagnosis not present

## 2018-07-16 DIAGNOSIS — Z9841 Cataract extraction status, right eye: Secondary | ICD-10-CM

## 2018-07-16 DIAGNOSIS — R402352 Coma scale, best motor response, localizes pain, at arrival to emergency department: Secondary | ICD-10-CM | POA: Diagnosis present

## 2018-07-16 DIAGNOSIS — I272 Pulmonary hypertension, unspecified: Secondary | ICD-10-CM | POA: Diagnosis present

## 2018-07-16 LAB — I-STAT CHEM 8, ED
BUN: 19 mg/dL (ref 8–23)
Calcium, Ion: 1.23 mmol/L (ref 1.15–1.40)
Chloride: 101 mmol/L (ref 98–111)
Creatinine, Ser: 0.8 mg/dL (ref 0.44–1.00)
Glucose, Bld: 280 mg/dL — ABNORMAL HIGH (ref 70–99)
HEMATOCRIT: 45 % (ref 36.0–46.0)
HEMOGLOBIN: 15.3 g/dL — AB (ref 12.0–15.0)
POTASSIUM: 3.3 mmol/L — AB (ref 3.5–5.1)
SODIUM: 135 mmol/L (ref 135–145)
TCO2: 23 mmol/L (ref 22–32)

## 2018-07-16 LAB — APTT: aPTT: 28 seconds (ref 24–36)

## 2018-07-16 LAB — I-STAT ARTERIAL BLOOD GAS, ED
Acid-base deficit: 4 mmol/L — ABNORMAL HIGH (ref 0.0–2.0)
Bicarbonate: 23.9 mmol/L (ref 20.0–28.0)
O2 Saturation: 96 %
PH ART: 7.258 — AB (ref 7.350–7.450)
Patient temperature: 99.4
TCO2: 25 mmol/L (ref 22–32)
pCO2 arterial: 53.8 mmHg — ABNORMAL HIGH (ref 32.0–48.0)
pO2, Arterial: 100 mmHg (ref 83.0–108.0)

## 2018-07-16 LAB — HEPATIC FUNCTION PANEL
ALBUMIN: 3.7 g/dL (ref 3.5–5.0)
ALT: 19 U/L (ref 0–44)
AST: 36 U/L (ref 15–41)
Alkaline Phosphatase: 115 U/L (ref 38–126)
Bilirubin, Direct: 0.1 mg/dL (ref 0.0–0.2)
Indirect Bilirubin: 0.5 mg/dL (ref 0.3–0.9)
TOTAL PROTEIN: 7.7 g/dL (ref 6.5–8.1)
Total Bilirubin: 0.6 mg/dL (ref 0.3–1.2)

## 2018-07-16 LAB — I-STAT TROPONIN, ED: TROPONIN I, POC: 0.42 ng/mL — AB (ref 0.00–0.08)

## 2018-07-16 LAB — I-STAT CG4 LACTIC ACID, ED: LACTIC ACID, VENOUS: 3.25 mmol/L — AB (ref 0.5–1.9)

## 2018-07-16 LAB — PROTIME-INR
INR: 1.04
Prothrombin Time: 13.5 seconds (ref 11.4–15.2)

## 2018-07-16 LAB — ETHANOL: Alcohol, Ethyl (B): <10 mg/dL (ref <10)

## 2018-07-16 MED ORDER — SODIUM CHLORIDE 0.9 % IV BOLUS (SEPSIS)
1000.0000 mL | Freq: Once | INTRAVENOUS | Status: AC
  Administered 2018-07-17: 1000 mL via INTRAVENOUS

## 2018-07-16 MED ORDER — METRONIDAZOLE IN NACL 5-0.79 MG/ML-% IV SOLN
500.0000 mg | Freq: Three times a day (TID) | INTRAVENOUS | Status: DC
  Administered 2018-07-17 – 2018-07-18 (×3): 500 mg via INTRAVENOUS
  Filled 2018-07-16 (×3): qty 100

## 2018-07-16 MED ORDER — VANCOMYCIN HCL IN DEXTROSE 1-5 GM/200ML-% IV SOLN
1000.0000 mg | Freq: Once | INTRAVENOUS | Status: AC
  Administered 2018-07-17: 1000 mg via INTRAVENOUS
  Filled 2018-07-16: qty 200

## 2018-07-16 MED ORDER — SODIUM CHLORIDE 0.9 % IV BOLUS (SEPSIS)
500.0000 mL | Freq: Once | INTRAVENOUS | Status: AC
  Administered 2018-07-17: 500 mL via INTRAVENOUS

## 2018-07-16 MED ORDER — SODIUM CHLORIDE 0.9 % IV BOLUS
500.0000 mL | Freq: Once | INTRAVENOUS | Status: AC
  Administered 2018-07-16: 500 mL via INTRAVENOUS

## 2018-07-16 MED ORDER — SODIUM CHLORIDE 0.9 % IV BOLUS (SEPSIS): 1000.0000 mL | Freq: Once | INTRAVENOUS | Status: DC

## 2018-07-16 MED ORDER — SODIUM CHLORIDE 0.9 % IV SOLN
2.0000 g | Freq: Once | INTRAVENOUS | Status: AC
  Administered 2018-07-17: 2 g via INTRAVENOUS
  Filled 2018-07-16: qty 2

## 2018-07-16 NOTE — ED Triage Notes (Signed)
Patient was last seen norma was last night at [facility] went to check on her and found pt unresponsive. Per EMS, neighbor reports a fall 2 days ago.

## 2018-07-16 NOTE — ED Notes (Signed)
RT notified for blood gas.

## 2018-07-16 NOTE — ED Provider Notes (Signed)
[facility] EMERGENCY DEPARTMENT Provider Note   CSN: [id number] Arrival date & time: 07/16/18  2156     History   Chief Complaint Chief Complaint  Patient presents with  . Unresponsive    HPI [name] is a 84 y.o. female.  Patient is an 84 year old female with past medical history significant for polio presenting for altered mental status from home.  Per report from neighbor, patient was found to be altered this evening and intermittently responsive.  EMS transferred patient to the ED, on a nonrebreather and states that she was intermittently responsive. Patient had a fall 2 days ago, lives alone family lives in [place]. Patient denies pain at this time upon arrival.   The history is provided by the patient. No language interpreter was used.    Past Medical History:  Diagnosis Date  . Anemia   . Asthma    once in 1950's  . AVM (arteriovenous malformation)   . Cancer (HCC)     right leg  skin  . Coronary artery disease   . Depression    sucide attempt in the distant past  . Excessive sweating   . GI bleed   . Hiatal hernia    s/p repair  . HLD (hyperlipidemia)   . HTN (hypertension)   . Hypertension   . Low back pain    ESI by Dr. Nelva Bush 2 weeks ago  . OA (osteoarthritis)    bilateral wrist, bilateral knees  . Obesity   . Obesity   . Ovarian cyst   . Right wrist injury    multiple surgeries and residual weakness.   . Rotator cuff arthropathy    right  . Seizures ([place]) 1980s   2/2 meningioma. none since cranioplasty  . Tuberculosis    + tb test      (father had)    Patient Active Problem List   Diagnosis Date Noted  . Preoperative general physical examination 12/25/2017  . Frequent falls 12/25/2017  . Weight loss, non-intentional 08/14/2017  . S/P shoulder replacement 03/29/2016  . Diverticulitis of colon without hemorrhage 12/04/2015  . Rotator cuff syndrome of left shoulder 10/26/2015  . Cervical disc disorder with  radiculopathy of cervical region 10/26/2015  . Heart murmur, systolic 62/94/7654  . Rosacea 09/07/2015  . Adenomatous colon polyp 04/12/2014  . Hearing loss in left ear 04/13/2013  . Coronary artery disease 03/25/2012  . Osteoporosis 11/13/2011  . ADD (attention deficit disorder) 02/22/2011  . Pure hypercholesterolemia 10/09/2006  . Major depression, recurrent, chronic ([place]) 10/09/2006  . Hereditary and idiopathic peripheral neuropathy 10/09/2006  . HYPERTENSION, BENIGN SYSTEMIC 10/09/2006  . OSTEOARTHRITIS, MULTI SITES 10/09/2006  . CONVULSIONS, SEIZURES, NOS 10/09/2006    Past Surgical History:  Procedure Laterality Date  . ABDOMINAL HYSTERECTOMY     left ovaries  . BREAST REDUCTION SURGERY    . CATARACT EXTRACTION Bilateral   . CRANIOPLASTY     2/2 meningioma 1980s  . CYSTECTOMY     rt ovary  age 38   . HIATAL HERNIA REPAIR    . KNEE ARTHROPLASTY     bilat  . LEFT HEART CATHETERIZATION WITH CORONARY ANGIOGRAM N/A 03/26/2012   Procedure: LEFT HEART CATHETERIZATION WITH CORONARY ANGIOGRAM;  Surgeon: Minus Breeding, MD;  Location: [facility] CATH LAB;  Service: Cardiovascular;  Laterality: N/A;  . REVERSE SHOULDER ARTHROPLASTY Right 03/29/2016  . REVERSE SHOULDER ARTHROPLASTY Right 03/29/2016   Procedure: RIGHT REVERSE SHOULDER ARTHROPLASTY;  Surgeon: Netta Cedars, MD;  Location: [facility];  Service: Orthopedics;  Laterality: Right;     OB History   None      Home Medications    Prior to Admission medications   Medication Sig Start Date End Date Taking? Authorizing Provider  amphetamine-dextroamphetamine (ADDERALL) 10 MG tablet Take 1 tablet (10 mg total) by mouth daily. 05/11/18 05/11/19  [name], MD  atorvastatin (LIPITOR) 10 MG tablet Take 1 tablet (10 mg total) by mouth daily. 12/26/17   [name], MD  Azelaic Acid 15 % cream Apply 1 application topically daily. 04/08/18   [provider]  calcium-vitamin D (OSCAL WITH D) 500-200 MG-UNIT tablet Take 1  tablet by mouth daily with breakfast. 06/15/18   [name], MD  Coenzyme Q10 (COQ10) 200 MG CAPS Take 200 mg by mouth daily.     [provider]  FLUoxetine (PROZAC) 20 MG tablet Take 1 tablet (20 mg total) by mouth daily. 03/23/18   [name], MD  gabapentin (NEURONTIN) 600 MG tablet Take 1 tablet (600 mg total) by mouth 2 (two) times daily. 09/25/17   [name], MD  hydrochlorothiazide (MICROZIDE) 12.5 MG capsule TAKE ONE CAPSULE BY MOUTH EVERY DAY Patient taking differently: Take 12.5 mg by mouth once a day 06/20/17   [name], MD  latanoprost (XALATAN) 0.005 % ophthalmic solution Place 1 drop into both eyes at bedtime. 05/07/18   [provider]  magnesium 30 MG tablet Take 30 mg by mouth 2 (two) times daily.    [provider]    Family History Family History  Problem Relation Age of Onset  . Alzheimer's disease Mother   . Heart disease Father        MI 56yo  . Heart disease Brother     Social History Social History   Tobacco Use  . Smoking status: Never Smoker  . Smokeless tobacco: Never Used  Substance Use Topics  . Alcohol use: No  . Drug use: No     Allergies   Amoxicillin; Penicillins; Phenobarbital; Phenytoin; and Tape   Review of Systems Review of Systems  Constitutional: Negative for activity change, appetite change, chills and fever.  HENT: Negative for ear pain and sore throat.   Eyes: Negative for pain and visual disturbance.  Respiratory: Negative for cough, chest tightness and shortness of breath.   Cardiovascular: Negative for chest pain and palpitations.  Gastrointestinal: Negative for abdominal pain, constipation, diarrhea, nausea and vomiting.  Genitourinary: Negative for dysuria and hematuria.  Musculoskeletal: Negative for arthralgias and back pain.  Skin: Negative for color change and rash.  Neurological: Negative for dizziness, seizures, syncope, weakness and headaches.    Psychiatric/Behavioral: Positive for confusion.  All other systems reviewed and are negative.    Physical Exam Updated Vital Signs BP 130/61   Pulse (!) 117   Temp 99.4 F (37.4 C) (Rectal)   Resp 17   Ht 5\' 5"  (1.651 m)   Wt 81.6 kg   SpO2 95%   BMI 29.95 kg/m   Physical Exam  Constitutional: She appears well-developed and well-nourished.  HENT:  Head: Normocephalic and atraumatic.  Eyes: Pupils are equal, round, and reactive to light. EOM are normal.  Pupil L>R  Neck: Normal range of motion. Neck supple.  Pulmonary/Chest: No respiratory distress. She has no wheezes.  Abdominal: Soft. Bowel sounds are normal.  Neurological: She is alert.  Skin: Skin is warm and dry.  Nursing note and vitals reviewed.    ED Treatments / Results  Labs (all labs ordered are listed, but only abnormal results are displayed) Labs Reviewed  I-STAT CHEM 8, ED - Abnormal; Notable for the following components:      Result Value   Potassium 3.3 (*)    Glucose, Bld 280 (*)    Hemoglobin 15.3 (*)    All other components within normal limits  I-STAT TROPONIN, ED - Abnormal; Notable for the following components:   Troponin i, poc 0.42 (*)    All other components within normal limits  I-STAT CG4 LACTIC ACID, ED - Abnormal; Notable for the following components:   Lactic Acid, Venous 3.25 (*)    All other components within normal limits  I-STAT ARTERIAL BLOOD GAS, ED - Abnormal; Notable for the following components:   pH, Arterial 7.258 (*)    pCO2 arterial 53.8 (*)    Acid-base deficit 4.0 (*)    All other components within normal limits  HEPATIC FUNCTION PANEL  URINALYSIS, ROUTINE W REFLEX MICROSCOPIC  PROTIME-INR  APTT  ETHANOL  BASIC METABOLIC PANEL  CBG MONITORING, ED    EKG EKG Interpretation  Date/Time:  Thursday July 16 2018 22:06:46 EST Ventricular Rate:  128 PR Interval:    QRS Duration: 104 QT Interval:  382 QTC Calculation: 557 R Axis:   -29 Text  Interpretation:  Atrial fibrillation with rapid ventricular response Anteroseptal infarct , age undetermined Abnormal ECG afib is new Confirmed by Varney Biles [id number]) on 07/16/2018 10:11:34 PM   Radiology Dg Chest 1 View  Result Date: 07/16/2018 CLINICAL DATA:  Initial evaluation for acute altered mental status. EXAM: CHEST  1 VIEW COMPARISON:  Prior radiograph from 07/15/2017. FINDINGS: Patient is rotated to the right. There is increased opacity along the right paratracheal stripe, more prevalent as compared to previous exam. Tortuosity the intrathoracic aorta with associated aortic atherosclerosis. Mild cardiomegaly. Lungs are hypoinflated. Patchy opacity at the medial right lung base favored to reflect atelectasis, although infiltrate could be considered. Mild diffuse pulmonary interstitial congestion without frank alveolar edema. No effusion. No pneumothorax. No acute osseous abnormality. Right shoulder arthroplasty noted. IMPRESSION: 1. Patchy opacity at the medial right lung base, favored to reflect atelectasis, although infiltrate could be considered in the correct clinical setting. 2. Prominent opacity along the right paratracheal stripe. While this finding may be related to rotation and/or underlying normal vasculature, possible adenopathy or mass could also have this appearance. Further assessment with dedicated cross-sectional imaging suggested. 3. Cardiomegaly with mild diffuse pulmonary interstitial congestion without frank alveolar edema. Electronically Signed   By: Jeannine Boga M.D.   On: 07/16/2018 23:05   Ct Head Wo Contrast  Result Date: 07/16/2018 CLINICAL DATA:  84 y/o  F; found unresponsive. EXAM: CT HEAD WITHOUT CONTRAST TECHNIQUE: Contiguous axial images were obtained from the base of the skull through the vertex without intravenous contrast. COMPARISON:  08/13/2017 CT head FINDINGS: Brain: No evidence of acute infarction, hemorrhage, hydrocephalus, extra-axial collection  or mass lesion/mass effect. Stable right parietal encephalomalacia. Stable chronic microvascular ischemic changes and volume loss of the brain. Vascular: Calcific atherosclerosis of carotid siphons. Skull: Chronic right parietal cranioplasty postsurgical changes. Stable small left frontal osteoma. Calvarium is otherwise unremarkable. Sinuses/Orbits: Stable chronic opacification of left mastoid tip. Additional visible paranasal sinuses and the mastoid air cells are normally aerated. Other: Bilateral intra-ocular lens replacement. IMPRESSION: 1. No acute intracranial abnormality identified. 2. Stable right parietal encephalomalacia and cranioplasty postsurgical changes. 3. Stable chronic microvascular ischemic changes and volume loss of the brain. Electronically Signed   By:  Kristine Garbe M.D.   On: 07/16/2018 22:41    Procedures Procedures (including critical care time)  Medications Ordered in ED Medications  sodium chloride 0.9 % bolus 500 mL (500 mLs Intravenous New Bag/Given 07/16/18 2257)     Initial Impression / Assessment and Plan / ED Course  I have reviewed the triage vital signs and the nursing notes.  Pertinent labs & imaging results that were available during my care of the patient were reviewed by me and considered in my medical decision making (see chart for details).  Clinical Course as of Jul 17 29  Fri Jul 17, 2018  0022 Sepsis - Repeat Assessment  Performed at:  [facility]     @[email or web address] @  Heart:     Regular rate and rhythm and Tachycardic  Lungs:    CTA  Capillary Refill:   <2 sec  Peripheral Pulse:   Radial pulse palpable  Skin:     Normal Color       [SW]    Clinical Course User Index [SW] Erskine Squibb, MD    Patient is a 84 y.o. female presenting for altered mental status from home. Patient was responsive in route, requiring NRBR for low SpO2 with EMS. Upon arrival, patient was responsive to voice and able to state  her name. Patient had difficulty moving her L side and asymmetric pupils so therefore concern for stroke. HCT unremarkable. Patient became more responsive, able to answer questions.  ABG showed patient was acidotic, elevated PCO2, normal PO2.  Labs also show elevated lactic acid. 500cc bolus given and patient had decrease in tachycardia.  Patient was trialed on BiPAP to see if it helped with her mental status, no change around 1 hour. Repeat ABG was ordered. Patient had elevated troponin. Serum troponin pending. Patient denying chest pain but does endorse nausea today. ASA given. Patient has elevated WBC, blood cultures were obtained and antibiotics were given. 30cc/kg fluid bolus ordered. Patient will be admitted to family medicine for further observation and management.   Final Clinical Impressions(s) / ED Diagnoses   Final diagnoses:  Altered mental status, unspecified altered mental status type    ED Discharge Orders    None       Erskine Squibb, MD 07/17/18 9741    Varney Biles, MD 07/17/18 [phone]

## 2018-07-17 ENCOUNTER — Other Ambulatory Visit (HOSPITAL_COMMUNITY): Payer: Self-pay

## 2018-07-17 ENCOUNTER — Inpatient Hospital Stay (HOSPITAL_COMMUNITY): Payer: Medicare Other

## 2018-07-17 ENCOUNTER — Encounter (HOSPITAL_COMMUNITY): Payer: Self-pay

## 2018-07-17 ENCOUNTER — Emergency Department (HOSPITAL_COMMUNITY): Payer: Medicare Other

## 2018-07-17 DIAGNOSIS — I2510 Atherosclerotic heart disease of native coronary artery without angina pectoris: Secondary | ICD-10-CM | POA: Diagnosis not present

## 2018-07-17 DIAGNOSIS — R7989 Other specified abnormal findings of blood chemistry: Secondary | ICD-10-CM

## 2018-07-17 DIAGNOSIS — I110 Hypertensive heart disease with heart failure: Secondary | ICD-10-CM | POA: Diagnosis present

## 2018-07-17 DIAGNOSIS — R918 Other nonspecific abnormal finding of lung field: Secondary | ICD-10-CM | POA: Diagnosis not present

## 2018-07-17 DIAGNOSIS — I63449 Cerebral infarction due to embolism of unspecified cerebellar artery: Secondary | ICD-10-CM

## 2018-07-17 DIAGNOSIS — J9601 Acute respiratory failure with hypoxia: Secondary | ICD-10-CM | POA: Diagnosis not present

## 2018-07-17 DIAGNOSIS — M19031 Primary osteoarthritis, right wrist: Secondary | ICD-10-CM | POA: Diagnosis present

## 2018-07-17 DIAGNOSIS — G8194 Hemiplegia, unspecified affecting left nondominant side: Secondary | ICD-10-CM | POA: Diagnosis not present

## 2018-07-17 DIAGNOSIS — N390 Urinary tract infection, site not specified: Secondary | ICD-10-CM | POA: Diagnosis not present

## 2018-07-17 DIAGNOSIS — I5031 Acute diastolic (congestive) heart failure: Secondary | ICD-10-CM | POA: Diagnosis not present

## 2018-07-17 DIAGNOSIS — R29818 Other symptoms and signs involving the nervous system: Secondary | ICD-10-CM

## 2018-07-17 DIAGNOSIS — Z86011 Personal history of benign neoplasm of the brain: Secondary | ICD-10-CM | POA: Diagnosis not present

## 2018-07-17 DIAGNOSIS — G92 Toxic encephalopathy: Secondary | ICD-10-CM | POA: Diagnosis not present

## 2018-07-17 DIAGNOSIS — G40901 Epilepsy, unspecified, not intractable, with status epilepticus: Secondary | ICD-10-CM | POA: Diagnosis not present

## 2018-07-17 DIAGNOSIS — R079 Chest pain, unspecified: Secondary | ICD-10-CM | POA: Diagnosis not present

## 2018-07-17 DIAGNOSIS — Z85828 Personal history of other malignant neoplasm of skin: Secondary | ICD-10-CM | POA: Diagnosis not present

## 2018-07-17 DIAGNOSIS — J189 Pneumonia, unspecified organism: Secondary | ICD-10-CM | POA: Diagnosis not present

## 2018-07-17 DIAGNOSIS — M5010 Cervical disc disorder with radiculopathy, unspecified cervical region: Secondary | ICD-10-CM | POA: Diagnosis present

## 2018-07-17 DIAGNOSIS — I671 Cerebral aneurysm, nonruptured: Secondary | ICD-10-CM | POA: Diagnosis not present

## 2018-07-17 DIAGNOSIS — R4020 Unspecified coma: Secondary | ICD-10-CM | POA: Diagnosis not present

## 2018-07-17 DIAGNOSIS — E785 Hyperlipidemia, unspecified: Secondary | ICD-10-CM | POA: Diagnosis present

## 2018-07-17 DIAGNOSIS — Z86010 Personal history of colonic polyps: Secondary | ICD-10-CM | POA: Diagnosis not present

## 2018-07-17 DIAGNOSIS — I4891 Unspecified atrial fibrillation: Secondary | ICD-10-CM | POA: Diagnosis not present

## 2018-07-17 DIAGNOSIS — I639 Cerebral infarction, unspecified: Secondary | ICD-10-CM | POA: Diagnosis not present

## 2018-07-17 DIAGNOSIS — R569 Unspecified convulsions: Secondary | ICD-10-CM | POA: Diagnosis not present

## 2018-07-17 DIAGNOSIS — J811 Chronic pulmonary edema: Secondary | ICD-10-CM | POA: Diagnosis not present

## 2018-07-17 DIAGNOSIS — K5732 Diverticulitis of large intestine without perforation or abscess without bleeding: Secondary | ICD-10-CM | POA: Diagnosis not present

## 2018-07-17 DIAGNOSIS — R52 Pain, unspecified: Secondary | ICD-10-CM

## 2018-07-17 DIAGNOSIS — L719 Rosacea, unspecified: Secondary | ICD-10-CM | POA: Diagnosis present

## 2018-07-17 DIAGNOSIS — I361 Nonrheumatic tricuspid (valve) insufficiency: Secondary | ICD-10-CM | POA: Diagnosis not present

## 2018-07-17 DIAGNOSIS — R4182 Altered mental status, unspecified: Secondary | ICD-10-CM

## 2018-07-17 DIAGNOSIS — J45909 Unspecified asthma, uncomplicated: Secondary | ICD-10-CM | POA: Diagnosis present

## 2018-07-17 DIAGNOSIS — E872 Acidosis: Secondary | ICD-10-CM | POA: Diagnosis not present

## 2018-07-17 DIAGNOSIS — M810 Age-related osteoporosis without current pathological fracture: Secondary | ICD-10-CM | POA: Diagnosis present

## 2018-07-17 DIAGNOSIS — A419 Sepsis, unspecified organism: Secondary | ICD-10-CM | POA: Diagnosis not present

## 2018-07-17 DIAGNOSIS — Z8612 Personal history of poliomyelitis: Secondary | ICD-10-CM | POA: Diagnosis not present

## 2018-07-17 DIAGNOSIS — J9602 Acute respiratory failure with hypercapnia: Secondary | ICD-10-CM | POA: Diagnosis not present

## 2018-07-17 DIAGNOSIS — R9430 Abnormal result of cardiovascular function study, unspecified: Secondary | ICD-10-CM | POA: Diagnosis not present

## 2018-07-17 DIAGNOSIS — M19032 Primary osteoarthritis, left wrist: Secondary | ICD-10-CM | POA: Diagnosis present

## 2018-07-17 DIAGNOSIS — I340 Nonrheumatic mitral (valve) insufficiency: Secondary | ICD-10-CM | POA: Diagnosis not present

## 2018-07-17 LAB — BASIC METABOLIC PANEL
Anion gap: 13 (ref 5–15)
Anion gap: 11 (ref 5–15)
BUN: 18 mg/dL (ref 8–23)
BUN: 13 mg/dL (ref 8–23)
CO2: 24 mmol/L (ref 22–32)
CO2: 20 mmol/L — AB (ref 22–32)
Calcium: 8.9 mg/dL (ref 8.9–10.3)
Calcium: 8.1 mg/dL — ABNORMAL LOW (ref 8.9–10.3)
Chloride: 102 mmol/L (ref 98–111)
Chloride: 103 mmol/L (ref 98–111)
Creatinine, Ser: 0.95 mg/dL (ref 0.44–1.00)
Creatinine, Ser: 0.7 mg/dL (ref 0.44–1.00)
GFR calc Af Amer: >60 mL/min (ref >60)
GFR calc Af Amer: >60 mL/min (ref >60)
GFR calc non Af Amer: 55 mL/min — ABNORMAL LOW (ref >60)
GFR calc non Af Amer: >60 mL/min (ref >60)
GLUCOSE: 192 mg/dL — AB (ref 70–99)
Glucose, Bld: 155 mg/dL — ABNORMAL HIGH (ref 70–99)
Potassium: 3.6 mmol/L (ref 3.5–5.1)
Potassium: 3.2 mmol/L — ABNORMAL LOW (ref 3.5–5.1)
Sodium: 135 mmol/L (ref 135–145)
Sodium: 138 mmol/L (ref 135–145)

## 2018-07-17 LAB — CBC WITH DIFFERENTIAL/PLATELET
ABS IMMATURE GRANULOCYTES: 0.1 10*3/uL — AB (ref 0.00–0.07)
Abs Immature Granulocytes: 0.09 10*3/uL — ABNORMAL HIGH (ref 0.00–0.07)
Basophils Absolute: 0 10*3/uL (ref 0.0–0.1)
Basophils Absolute: 0 10*3/uL (ref 0.0–0.1)
Basophils Relative: 0 %
Basophils Relative: 0 %
Eosinophils Absolute: 0 10*3/uL (ref 0.0–0.5)
Eosinophils Absolute: 0 10*3/uL (ref 0.0–0.5)
Eosinophils Relative: 0 %
Eosinophils Relative: 0 %
HEMATOCRIT: 38.2 % (ref 36.0–46.0)
HEMATOCRIT: 40.7 % (ref 36.0–46.0)
Hemoglobin: 12.1 g/dL (ref 12.0–15.0)
Hemoglobin: 12.6 g/dL (ref 12.0–15.0)
Immature Granulocytes: 1 %
Immature Granulocytes: 1 %
LYMPHS ABS: 0.4 10*3/uL — AB (ref 0.7–4.0)
Lymphocytes Relative: 2 %
Lymphocytes Relative: 6 %
Lymphs Abs: 0.8 10*3/uL (ref 0.7–4.0)
MCH: 28 pg (ref 26.0–34.0)
MCH: 28.2 pg (ref 26.0–34.0)
MCHC: 31 g/dL (ref 30.0–36.0)
MCHC: 31.7 g/dL (ref 30.0–36.0)
MCV: 89 fL (ref 80.0–100.0)
MCV: 90.4 fL (ref 80.0–100.0)
MONO ABS: 0.8 10*3/uL (ref 0.1–1.0)
Monocytes Absolute: 0.9 10*3/uL (ref 0.1–1.0)
Monocytes Relative: 5 %
Monocytes Relative: 6 %
Neutro Abs: 11.7 10*3/uL — ABNORMAL HIGH (ref 1.7–7.7)
Neutro Abs: 14.9 10*3/uL — ABNORMAL HIGH (ref 1.7–7.7)
Neutrophils Relative %: 87 %
Neutrophils Relative %: 92 %
Platelets: 284 10*3/uL (ref 150–400)
Platelets: 286 10*3/uL (ref 150–400)
RBC: 4.29 MIL/uL (ref 3.87–5.11)
RBC: 4.5 MIL/uL (ref 3.87–5.11)
RDW: 13.6 % (ref 11.5–15.5)
RDW: 13.8 % (ref 11.5–15.5)
WBC: 13.5 10*3/uL — ABNORMAL HIGH (ref 4.0–10.5)
WBC: 16.2 10*3/uL — ABNORMAL HIGH (ref 4.0–10.5)
nRBC: 0 % (ref 0.0–0.2)
nRBC: 0 % (ref 0.0–0.2)

## 2018-07-17 LAB — RAPID URINE DRUG SCREEN, HOSP PERFORMED
Amphetamines: NOT DETECTED
Barbiturates: NOT DETECTED
Benzodiazepines: NOT DETECTED
Cocaine: NOT DETECTED
Opiates: NOT DETECTED
TETRAHYDROCANNABINOL: NOT DETECTED

## 2018-07-17 LAB — COMPREHENSIVE METABOLIC PANEL
ALT: 18 U/L (ref 0–44)
AST: 32 U/L (ref 15–41)
Albumin: 3.4 g/dL — ABNORMAL LOW (ref 3.5–5.0)
Alkaline Phosphatase: 104 U/L (ref 38–126)
Anion gap: 13 (ref 5–15)
BILIRUBIN TOTAL: 0.3 mg/dL (ref 0.3–1.2)
BUN: 18 mg/dL (ref 8–23)
CO2: 21 mmol/L — ABNORMAL LOW (ref 22–32)
Calcium: 8.8 mg/dL — ABNORMAL LOW (ref 8.9–10.3)
Chloride: 100 mmol/L (ref 98–111)
Creatinine, Ser: 0.92 mg/dL (ref 0.44–1.00)
GFR calc Af Amer: >60 mL/min (ref >60)
GFR, EST NON AFRICAN AMERICAN: 57 mL/min — AB (ref >60)
Glucose, Bld: 192 mg/dL — ABNORMAL HIGH (ref 70–99)
Potassium: 3.6 mmol/L (ref 3.5–5.1)
Sodium: 134 mmol/L — ABNORMAL LOW (ref 135–145)
Total Protein: 7 g/dL (ref 6.5–8.1)

## 2018-07-17 LAB — I-STAT ARTERIAL BLOOD GAS, ED
Acid-base deficit: 3 mmol/L — ABNORMAL HIGH (ref 0.0–2.0)
Bicarbonate: 24.1 mmol/L (ref 20.0–28.0)
O2 Saturation: 98 %
Patient temperature: 99.4
TCO2: 26 mmol/L (ref 22–32)
pCO2 arterial: 49.6 mmHg — ABNORMAL HIGH (ref 32.0–48.0)
pH, Arterial: 7.297 — ABNORMAL LOW (ref 7.350–7.450)
pO2, Arterial: 113 mmHg — ABNORMAL HIGH (ref 83.0–108.0)

## 2018-07-17 LAB — URINALYSIS, ROUTINE W REFLEX MICROSCOPIC
Bilirubin Urine: NEGATIVE
Glucose, UA: 50 mg/dL — AB
HGB URINE DIPSTICK: NEGATIVE
Ketones, ur: NEGATIVE mg/dL
NITRITE: NEGATIVE
PH: 6 (ref 5.0–8.0)
Protein, ur: 30 mg/dL — AB
SPECIFIC GRAVITY, URINE: 1.015 (ref 1.005–1.030)

## 2018-07-17 LAB — BLOOD GAS, ARTERIAL
Acid-base deficit: 2 mmol/L (ref 0.0–2.0)
Bicarbonate: 22.3 mmol/L (ref 20.0–28.0)
Drawn by: 35043
O2 CONTENT: 4 L/min
O2 SAT: 94.1 %
Patient temperature: 98.2
pCO2 arterial: 38 mmHg (ref 32.0–48.0)
pH, Arterial: 7.386 (ref 7.350–7.450)
pO2, Arterial: 67.6 mmHg — ABNORMAL LOW (ref 83.0–108.0)

## 2018-07-17 LAB — TROPONIN I
TROPONIN I: 0.89 ng/mL — AB (ref <0.03)
Troponin I: 0.59 ng/mL (ref <0.03)
Troponin I: 0.81 ng/mL (ref <0.03)
Troponin I: 0.87 ng/mL (ref <0.03)
Troponin I: 1.16 ng/mL (ref <0.03)

## 2018-07-17 LAB — HEMOGLOBIN A1C
HEMOGLOBIN A1C: 5 % (ref 4.8–5.6)
MEAN PLASMA GLUCOSE: 96.8 mg/dL

## 2018-07-17 LAB — LIPID PANEL
Cholesterol: 122 mg/dL (ref 0–200)
HDL: 44 mg/dL (ref >40)
LDL Cholesterol: 70 mg/dL (ref 0–99)
Total CHOL/HDL Ratio: 2.8 RATIO
Triglycerides: 42 mg/dL (ref <150)
VLDL: 8 mg/dL (ref 0–40)

## 2018-07-17 LAB — BRAIN NATRIURETIC PEPTIDE: B Natriuretic Peptide: 593.3 pg/mL — ABNORMAL HIGH (ref 0.0–100.0)

## 2018-07-17 LAB — STREP PNEUMONIAE URINARY ANTIGEN: Strep Pneumo Urinary Antigen: NEGATIVE

## 2018-07-17 LAB — PROCALCITONIN: Procalcitonin: 2.49 ng/mL

## 2018-07-17 LAB — LACTIC ACID, PLASMA
Lactic Acid, Venous: 1.2 mmol/L (ref 0.5–1.9)
Lactic Acid, Venous: 1 mmol/L (ref 0.5–1.9)

## 2018-07-17 LAB — TSH: TSH: 0.708 u[IU]/mL (ref 0.350–4.500)

## 2018-07-17 LAB — CK: Total CK: 161 U/L (ref 38–234)

## 2018-07-17 LAB — I-STAT CG4 LACTIC ACID, ED: Lactic Acid, Venous: 2.2 mmol/L (ref 0.5–1.9)

## 2018-07-17 MED ORDER — SODIUM CHLORIDE 0.9 % IV SOLN
1.0000 g | Freq: Three times a day (TID) | INTRAVENOUS | Status: DC
  Administered 2018-07-17 – 2018-07-18 (×4): 1 g via INTRAVENOUS
  Filled 2018-07-17 (×5): qty 1

## 2018-07-17 MED ORDER — QUETIAPINE FUMARATE 25 MG PO TABS
25.0000 mg | ORAL_TABLET | Freq: Once | ORAL | Status: AC
  Administered 2018-07-17: 25 mg via ORAL
  Filled 2018-07-17: qty 1

## 2018-07-17 MED ORDER — VANCOMYCIN HCL 10 G IV SOLR
1250.0000 mg | INTRAVENOUS | Status: DC
  Administered 2018-07-17: 1250 mg via INTRAVENOUS
  Filled 2018-07-17: qty 1250

## 2018-07-17 MED ORDER — IOPAMIDOL (ISOVUE-370) INJECTION 76%
100.0000 mL | Freq: Once | INTRAVENOUS | Status: AC | PRN
  Administered 2018-07-17: 75 mL via INTRAVENOUS

## 2018-07-17 MED ORDER — SODIUM CHLORIDE 0.9 % IV BOLUS
1000.0000 mL | Freq: Once | INTRAVENOUS | Status: AC
  Administered 2018-07-17: 1000 mL via INTRAVENOUS

## 2018-07-17 MED ORDER — IBUPROFEN 200 MG PO TABS: 400.0000 mg | ORAL_TABLET | Freq: Once | ORAL | Status: DC

## 2018-07-17 MED ORDER — IOPAMIDOL (ISOVUE-370) INJECTION 76%
INTRAVENOUS | Status: AC
  Filled 2018-07-17: qty 100

## 2018-07-17 MED ORDER — ACETAMINOPHEN 160 MG/5ML PO SOLN: 650.0000 mg | ORAL | Status: DC | PRN

## 2018-07-17 MED ORDER — ACETAMINOPHEN 325 MG PO TABS
650.0000 mg | ORAL_TABLET | ORAL | Status: DC | PRN
  Filled 2018-07-17: qty 2

## 2018-07-17 MED ORDER — LEVETIRACETAM IN NACL 500 MG/100ML IV SOLN
500.0000 mg | Freq: Two times a day (BID) | INTRAVENOUS | Status: DC
  Administered 2018-07-17 – 2018-07-20 (×7): 500 mg via INTRAVENOUS
  Filled 2018-07-17 (×7): qty 100

## 2018-07-17 MED ORDER — STROKE: EARLY STAGES OF RECOVERY BOOK
Freq: Once | Status: AC
  Administered 2018-07-17: 07:00:00
  Filled 2018-07-17: qty 1

## 2018-07-17 MED ORDER — ASPIRIN 300 MG RE SUPP
300.0000 mg | Freq: Once | RECTAL | Status: AC
  Administered 2018-07-17: 300 mg via RECTAL
  Filled 2018-07-17: qty 1

## 2018-07-17 MED ORDER — ACETAMINOPHEN 650 MG RE SUPP: 650.0000 mg | RECTAL | Status: DC | PRN

## 2018-07-17 MED ORDER — ACETAMINOPHEN 500 MG PO TABS
1000.0000 mg | ORAL_TABLET | Freq: Once | ORAL | Status: AC
  Administered 2018-07-17: 1000 mg via ORAL
  Filled 2018-07-17: qty 2

## 2018-07-17 MED ORDER — ATORVASTATIN CALCIUM 40 MG PO TABS
40.0000 mg | ORAL_TABLET | Freq: Every day | ORAL | Status: DC
  Administered 2018-07-17 – 2018-07-22 (×6): 40 mg via ORAL
  Filled 2018-07-17 (×6): qty 1

## 2018-07-17 NOTE — ED Notes (Signed)
Paged admitting dr frank to neuro dr aroor

## 2018-07-17 NOTE — Progress Notes (Signed)
Same-day progress note  S: Patient seen and examined She is drowsy but awakens to voice.  Cooperates with the exam for most part. Her primary care physician, Dr. Andria Frames was at bedside providing some baseline. She is definitely off her baseline per Dr. Andria Frames.  O: Brief neurological examination Patient is drowsy but awakens to voice, oriented to place and person.  No neck stiffness.  Negative Kernig's and Brudzinski's. She has poor attention concentration. Her speech is not dysarthric. Cranial nerves: Left pupil is 7 mm and nonreactive, right pupil is 6 mm reactive, face is symmetric, visual fields are full, extra movements are intact, tongue is midline. Motor exam: Right upper and lower extremity are 5/5.  Left upper and lower extremity are also nearly symmetric 5/5 with no vertical drift. Sensory exam: No decreased sensation Gait testing was deferred at this time Coordination testing was not performed.  Imaging was reviewed by me personally. MRI of the brain shows diffusion abnormality involving the right mesial temporal lobe and the hippocampus, which could definitely reflect sequela of acute seizures status epilepticus.  A punctate focus of diffusion abnormality within the inferior right cerebellum suggestive of a non-concomitant acute to early subacute infarct.  Prior right parieto-occipital cranioplasty with underlying encephalomalacia CT Angie head and neck negative for large vessel occlusion.  Bilateral cavernous ICA aneurysms measuring up to 3 mm on the right and 6 cm on the left.  Labs reveal a white count of 16.2, normal sodium, normal BUN/creatinine, lactate 2.2, troponin 0 0.81.  UDS negative.  Stroke labs Hemoglobin A1c-5.0 LDL-70  Assessment 84 year old woman with past medical history of meningioma status post craniotomy on the right side, subdural hemorrhage with resulting right parietal encephalomalacia, history of seizures,  depression and suicidal ideation in the past,  brought to the emergency room after being found down and altered from home.  She was noted to be weak on the left side and left-sided weakness improved.    On my exam she was only very slightly weak on the left compared to the right and answer questions appropriately although she was more drowsy than usual.  Her family physician Dr. Kennith Gain was at the bedside and he is also getting for her mentation. I agree with Dr. Arrie Eastern assessment of this being a prolonged seizure with ensuing Todd's paresis.  He has a history of seizure sometime in the past, but was not on antiepileptic.  Dr. Andria Frames provided the history.  There is no neck stiffness to suggest meningitis.   Her gradually returning back to baseline, history of seizures, imaging findings point more towards status epilepticus following seizures that has resolved rather than an infectious process like HSV encephalitis.  She also has atrial fibrillation with RVR noted upon arrival-I think this is a new diagnosis and the cerebellar stroke can be attributed to embolic phenomenon from it.  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 fib with RVR. Sepsis-unknown source being managed by primary team. Troponinemia  RECOMMENDATIONS  For Seizures: Agree with starting Keppra 500 twice daily Seizure precautions Routine EEG Consider spinal tap if mentation does not improve after treatment of presumed sepsis as well as antiepileptics.  For Stroke: Work-up underway.  Vascular imaging unremarkable. Echo pending. Aspirin for now.  Will need anticoagulation with NOAC for long-term stroke prevention. Atorvastatin 80.  Goal LDL less than 70 A1c in goal range PT, OT, speech therapy. Management  of A. fib with RVR per primary team.  Management of troponinemia per primary team as you are.  Other  active issues: Sepsis -management per primary team as you are Depression-management per primary team as you are Goals of care discussion-underway with Dr. Andria Frames.  Neurology will continue to follow with you.  -- Amie Portland, MD [facility] Pager: [phone] If 7pm to 7am, please call on call as listed on [facility].  No charge note

## 2018-07-17 NOTE — Progress Notes (Signed)
SLP Cancellation Note  Patient Details Name: [name] MRN: [id number] DOB: 07/30/1931   Cancelled treatment:       Reason Eval/Treat Not Completed: Other (comment). Not appropriate for cognitive linguistic assessment at this time. No orders in chart for swallow eval. Given no CVA, [facility] not appropriate. Recommend MD initiate diet when appropriate or order SLP eval if there are concerns about pts ability to swallow safely.   Donna Baltimore, MA CCC-SLP  Acute Rehabilitation Services Pager [phone] Office [phone]  [name] 07/17/2018, 10:45 AM

## 2018-07-17 NOTE — Progress Notes (Signed)
OT Cancellation Note  Patient Details Name: [name] MRN: [id number] DOB: [birth year]   Cancelled Treatment:    Reason Eval/Treat Not Completed: Medical issues which prohibited therapy. Pt's troponin trending up. Per department protocol, will hold OT for now. Will continue to follow acutely and await medically appropriateness.  Tyrone Schimke, OT Acute Rehabilitation Services Pager: [phone] Office: [phone]  07/17/2018, 12:57 PM

## 2018-07-17 NOTE — Progress Notes (Signed)
Patient arrived in the unit in a bed  at 0300 am escorted by ED nurse, no s/s of distress, alert and oriented x2,, receiving 4L oxygen by the [facility] [place], resting in a bed, set up in tele monitor box no. 09, bed is on lowest position, bed alarm is on, call bell and personal belongings are within reach, and will continue to monitor.

## 2018-07-17 NOTE — Progress Notes (Signed)
Family Medicine Teaching Service Daily Progress Note Intern Pager: [phone]  Patient name: [name] Medical record number: [id number] Date of birth: 08-Jan-1931 Age: 84 y.o. Gender: female  Primary Care Provider: [name], MD Consultants: neuro, cardiology Code Status: Full code  Pt Overview and Major Events to Date:  Hospital Day 0 Admitted: 07/16/2018    Assessment and Plan: [name] is a 84 y.o. female who presented with AMS and L side Upper and lower focal deficits, as well as sepsis and volume overload suggestive of new onset heart failure. Altered mental status and FND are now believed to be the result of a seizure. PMH is significant for encephalomalacia of the right parietal lobe status post craniotomy for meningioma, seizure d/o, CAD (mild plaque in 03/2012 cath), frequent falls and hypertension, subdural hematoma, HLD  AMS w/ L side deficit - patient was found down at home and brought to ED.  Initial labs shwoed wbc 16.2> 13.5, LA 3.25. CXR showed right lobar opacity. No evidence of hemorrhagic stroke on head CT. MRI brain showed increased diffusion abnormality in right temporal lobe suggesting seizure, inferior R cerebellum acute infarct, right parieto-occipital cranioplasty with underlying encephalomalacia. No evidence of large vessel occlusion on CTA. Neurology was consulted, and they suspect it may be todd's paralysis rather than stroke. Upon speaking with patient today her mental status is much improved and her focal neural deficits have resolved.  - f/u EEG - keppra 500mg  BID  - holding all neuroactive medication: gabapentin, prozac, adderall) - continue sepsis treatment: vanc, aztreonam, flagyl and f/u cultures.  - PT/OT -swallow screen - neuro checks - risk start labs: TSH, lipid panel, A1c - UDS pending  - f/u CK - f/u ECHO - statin 40mg  daily  Sepsis - today patient is alert and oriented with no physical complaints. Her vitals have been within  normal limits . On exam she had no acute complaints. CXR suggests R middle lobe opacity. Most likely source at this time is pneumonia though patient not endorsing cough or other respiratory signs.  Blood and urine Cx pending, on broad spec (vanc, flagyl, aztreonam due to PCN allergy),  WBC 16/2> , LA 3.25>2.2.  We will continue to treat empirically while we await cultures and trend LA and wbc.   - 31ml/kg NS bolus - f/u PCT, legionella, S. pneumo - continue abx - f/u cultures - trend LA  Volume overload - no Hx of heart failure. Contact cardiology regarding ECHO and new heart failure.  BNP - 593.3, trop 0.59> 0.81.  - cards consult  - f/u TTE   Acute hypercarbic resp failure - ABG has improved to 7.38, pco2 38.   - nasal cannula 4L. Wean as able.   HTN - HCTZ 12.5 at home.  Allowing permissive HTN currently - restart meds when appropriate.   HLD -  - lipid panel f/u - atorvostatin 40mg    Chronic pain - holding gabapentin   Bipolar/depression - depressed topday.  Wishing to die, stating she doesn't want to live in this world anymore.  States she is not suicidal though.   - holding prozac in setting of seizure/stroke    Fluids: none . aztreonam    . levETIRAcetam    . metronidazole 500 mg (07/17/18 0206)  . vancomycin     Electrolytes: replete PRN  Nutrition: reg GI ppx: none DVT px: none  Disposition: SNF   Medications: Scheduled Meds: .  stroke: mapping our early stages of recovery book  Does not apply Once  . iopamidol       Continuous Infusions: . aztreonam    . levETIRAcetam    . metronidazole 500 mg (07/17/18 0206)  . vancomycin     PRN Meds: acetaminophen **OR** acetaminophen (TYLENOL) oral liquid 160 mg/5 mL **OR** acetaminophen  ================================================= ================================================= Subjective:  Patient states she is not in any pain, she knows she is in the hospital but is not sure why and doesn't remember  the events leading up to her admission to the hospital.  She repeats that she is depressed and that she doesn't want to live anymore in this crazy world. She states she is not suicidal though.  Her daughter is in [place] and wants to stay with her.    Objective: Temp:  [98.2 F (36.8 C)-99.4 F (37.4 C)] 98.2 F (36.8 C) (12/06 0322) Pulse Rate:  [96-129] 96 (12/06 0322) Resp:  [0-31] 18 (12/06 0322) BP: (127-166)/(55-98) 166/78 (12/06 0322) SpO2:  [91 %-100 %] 95 % (12/06 0322) Weight:  [81.6 kg-82.3 kg] 82.3 kg (12/06 0322) Intake/Output 12/05 0701 - 12/06 0700 In: 3400 [IV Piggyback:3400] Out: 400 [Urine:400] Physical Exam:  Gen: laying in bed, no acute distress.  Appears emotionally distraught.  Alert and oriented to person, place, time.  HEENT: Normocephaic, atraumatic. Clear conjuctiva, no scleral icterus and injection.  CV: normal rate with an irregular rhythm. Systolic murmur heard in aortic and pulmonic regions   Normal capillary refill bilaterally.  Radial pulses 2+ bilaterally. No bilateral lower extremity edema. Resp: Clear to auscultation bilaterally.  No wheezing, rales, abnormal lung sounds.  No increased work of breathing appreciated. Abd: Nontender and nondistended on palpation to all 4 quadrants.  Positive bowel sounds. Neuro:  PERRLA, 5/5 strength bilateral upper and lower extremities.  Full range of motion.  Psych: patient sad with blunted affect.  Talks of dying and being depressed repeatedly.  No active SI.  Extremities: Full ROM    Laboratory: Recent Labs  Lab 07/16/18 2215 07/16/18 2358  WBC  --  16.2*  HGB 15.3* 12.6  HCT 45.0 40.7  PLT  --  286   Recent Labs  Lab 07/16/18 2207 07/16/18 2215 07/16/18 2358  NA  --  135 135  134*  K  --  3.3* 3.6  3.6  CL  --  101 102  100  CO2  --   --  20*  21*  BUN  --  19 18  18   CREATININE  --  0.80 0.95  0.92  CALCIUM  --   --  8.9  8.8*  PROT 7.7  --  7.0  BILITOT 0.6  --  0.3  ALKPHOS 115  --   104  ALT 19  --  18  AST 36  --  32  GLUCOSE  --  280* 192*  192*   Imaging/Diagnostic Tests: Ct Angio Head W Or Wo Contrast  Result Date: 07/17/2018 CLINICAL DATA:  Initial evaluation for acute altered mental status, found down. EXAM: CT ANGIOGRAPHY HEAD AND NECK TECHNIQUE: Multidetector CT imaging of the head and neck was performed using the standard protocol during bolus administration of intravenous contrast. Multiplanar CT image reconstructions and MIPs were obtained to evaluate the vascular anatomy. Carotid stenosis measurements (when applicable) are obtained utilizing NASCET criteria, using the distal internal carotid diameter as the denominator. CONTRAST:  64mL ISOVUE-370 IOPAMIDOL (ISOVUE-370) INJECTION 76% COMPARISON:  Prior CT from 07/16/2018 FINDINGS: CTA NECK FINDINGS Aortic arch: Visualized aortic arch of normal caliber  with normal branch pattern. Moderate to advanced atheromatous plaque about the arch and origin of the great vessels without high-grade stenosis. Great vessels tortuous proximally. Visualized subclavian arteries patent without stenosis. Right carotid system: Right common carotid artery tortuous but widely patent to the bifurcation without stenosis. Calcified plaque about the right bifurcation without hemodynamically significant stenosis. Right ICA medialized into the retropharyngeal space and widely patent to the skull base without stenosis, dissection, or occlusion. Left carotid system: Left common carotid artery tortuous proximally but widely patent to the bifurcation. Concentric plaque about the left bifurcation without hemodynamically significant stenosis. Left ICA medialized into the retropharyngeal space but widely patent to the skull base without stenosis, dissection, or occlusion. Vertebral arteries: Both of the vertebral arteries arise from the subclavian arteries. Relatively mild stenosis at the origin of the right vertebral artery. Irregularity of the pre foraminal  right V1 segment could be related atherosclerotic change versus changes related to mild FMD. Vertebral arteries tortuous but patent to the vertebrobasilar junction without stenosis, dissection, or occlusion. Skeleton: No acute osseous abnormality. No discrete lytic or blastic osseous lesions. Moderate cervical spondylolysis at C4-5 through C6-7. Other neck: No acute soft tissue abnormality within the neck. Salivary glands within normal limits. No adenopathy. Thyroid within normal limits. Upper chest: Scattered ground-glass opacity with interlobular septal thickening within the visualized upper lungs, consistent with pulmonary interstitial edema. Partially visualized upper chest demonstrates no other acute finding. Review of the MIP images confirms the above findings CTA HEAD FINDINGS Anterior circulation: Petrous segments patent bilaterally. Mild scattered atheromatous plaque within the cavernous/supraclinoid ICAs without hemodynamically significant stenosis. 3 mm focal outpouching arising from the cavernous right ICA compatible with a small aneurysm (series 8, image 92). There is an additional fairly wide necked aneurysm arising from the cavernous left ICA measuring approximately 6 x 5 x 6 mm (series 8, image 98). This is directed laterally and slightly posteriorly. ICA termini patent bilaterally, tortuous on the left. A1 segments patent bilaterally. Normal anterior communicating artery complex. Anterior cerebral arteries patent to their distal aspects without flow-limiting stenosis. No M1 occlusion or stenosis. Normal MCA bifurcations. Distal MCA branches well perfused and fairly symmetric. Posterior circulation: Bilateral V4 segments demonstrate scattered multifocal atheromatous irregularity without high-grade stenosis. Posterior inferior cerebral arteries patent bilaterally. Basilar artery somewhat diminutive but widely patent to its distal aspect. Superior cerebral arteries patent bilaterally. Fetal type origin  of the left PCA supplied via a widely patent left posterior communicating artery. Hypoplastic right P1 with prominent right posterior communicating artery. PCAs patent to their distal aspects without occlusion or high-grade stenosis. Venous sinuses: Grossly patent. Anatomic variants: Predominant fetal type origin of the PCAs with somewhat diminutive vertebrobasilar system. Delayed phase: No abnormal enhancement. Right parieto-occipital cranioplasty with underlying encephalomalacia noted. Review of the MIP images confirms the above findings IMPRESSION: 1. Negative CTA for large vessel occlusion. No hemodynamically significant or correctable stenosis identified. 2. Bilateral cavernous ICA aneurysms as above, measuring up to 3 mm on the right and 6 mm on the left. 3. Diffuse tortuosity of the major arterial vasculature of the head and neck, suggesting underlying chronic hypertension. 4. Scattered ground-glass opacity with interlobular septal thickening within the visualized lungs, compatible with pulmonary interstitial edema. Correlation with plain film radiography suggested. Electronically Signed   By: Jeannine Boga M.D.   On: 07/17/2018 05:27   Dg Chest 1 View  Result Date: 07/16/2018 CLINICAL DATA:  Initial evaluation for acute altered mental status. EXAM: CHEST  1 VIEW COMPARISON:  Prior  radiograph from 07/15/2017. FINDINGS: Patient is rotated to the right. There is increased opacity along the right paratracheal stripe, more prevalent as compared to previous exam. Tortuosity the intrathoracic aorta with associated aortic atherosclerosis. Mild cardiomegaly. Lungs are hypoinflated. Patchy opacity at the medial right lung base favored to reflect atelectasis, although infiltrate could be considered. Mild diffuse pulmonary interstitial congestion without frank alveolar edema. No effusion. No pneumothorax. No acute osseous abnormality. Right shoulder arthroplasty noted. IMPRESSION: 1. Patchy opacity at the  medial right lung base, favored to reflect atelectasis, although infiltrate could be considered in the correct clinical setting. 2. Prominent opacity along the right paratracheal stripe. While this finding may be related to rotation and/or underlying normal vasculature, possible adenopathy or mass could also have this appearance. Further assessment with dedicated cross-sectional imaging suggested. 3. Cardiomegaly with mild diffuse pulmonary interstitial congestion without frank alveolar edema. Electronically Signed   By: Jeannine Boga M.D.   On: 07/16/2018 23:05   Ct Head Wo Contrast  Result Date: 07/16/2018 CLINICAL DATA:  84 y/o  F; found unresponsive. EXAM: CT HEAD WITHOUT CONTRAST TECHNIQUE: Contiguous axial images were obtained from the base of the skull through the vertex without intravenous contrast. COMPARISON:  08/13/2017 CT head FINDINGS: Brain: No evidence of acute infarction, hemorrhage, hydrocephalus, extra-axial collection or mass lesion/mass effect. Stable right parietal encephalomalacia. Stable chronic microvascular ischemic changes and volume loss of the brain. Vascular: Calcific atherosclerosis of carotid siphons. Skull: Chronic right parietal cranioplasty postsurgical changes. Stable small left frontal osteoma. Calvarium is otherwise unremarkable. Sinuses/Orbits: Stable chronic opacification of left mastoid tip. Additional visible paranasal sinuses and the mastoid air cells are normally aerated. Other: Bilateral intra-ocular lens replacement. IMPRESSION: 1. No acute intracranial abnormality identified. 2. Stable right parietal encephalomalacia and cranioplasty postsurgical changes. 3. Stable chronic microvascular ischemic changes and volume loss of the brain. Electronically Signed   By: Kristine Garbe M.D.   On: 07/16/2018 22:41   Ct Angio Neck W Or Wo Contrast  Result Date: 07/17/2018 CLINICAL DATA:  Initial evaluation for acute altered mental status, found down. EXAM: CT  ANGIOGRAPHY HEAD AND NECK TECHNIQUE: Multidetector CT imaging of the head and neck was performed using the standard protocol during bolus administration of intravenous contrast. Multiplanar CT image reconstructions and MIPs were obtained to evaluate the vascular anatomy. Carotid stenosis measurements (when applicable) are obtained utilizing NASCET criteria, using the distal internal carotid diameter as the denominator. CONTRAST:  41mL ISOVUE-370 IOPAMIDOL (ISOVUE-370) INJECTION 76% COMPARISON:  Prior CT from 07/16/2018 FINDINGS: CTA NECK FINDINGS Aortic arch: Visualized aortic arch of normal caliber with normal branch pattern. Moderate to advanced atheromatous plaque about the arch and origin of the great vessels without high-grade stenosis. Great vessels tortuous proximally. Visualized subclavian arteries patent without stenosis. Right carotid system: Right common carotid artery tortuous but widely patent to the bifurcation without stenosis. Calcified plaque about the right bifurcation without hemodynamically significant stenosis. Right ICA medialized into the retropharyngeal space and widely patent to the skull base without stenosis, dissection, or occlusion. Left carotid system: Left common carotid artery tortuous proximally but widely patent to the bifurcation. Concentric plaque about the left bifurcation without hemodynamically significant stenosis. Left ICA medialized into the retropharyngeal space but widely patent to the skull base without stenosis, dissection, or occlusion. Vertebral arteries: Both of the vertebral arteries arise from the subclavian arteries. Relatively mild stenosis at the origin of the right vertebral artery. Irregularity of the pre foraminal right V1 segment could be related atherosclerotic change versus changes related  to mild FMD. Vertebral arteries tortuous but patent to the vertebrobasilar junction without stenosis, dissection, or occlusion. Skeleton: No acute osseous abnormality. No  discrete lytic or blastic osseous lesions. Moderate cervical spondylolysis at C4-5 through C6-7. Other neck: No acute soft tissue abnormality within the neck. Salivary glands within normal limits. No adenopathy. Thyroid within normal limits. Upper chest: Scattered ground-glass opacity with interlobular septal thickening within the visualized upper lungs, consistent with pulmonary interstitial edema. Partially visualized upper chest demonstrates no other acute finding. Review of the MIP images confirms the above findings CTA HEAD FINDINGS Anterior circulation: Petrous segments patent bilaterally. Mild scattered atheromatous plaque within the cavernous/supraclinoid ICAs without hemodynamically significant stenosis. 3 mm focal outpouching arising from the cavernous right ICA compatible with a small aneurysm (series 8, image 92). There is an additional fairly wide necked aneurysm arising from the cavernous left ICA measuring approximately 6 x 5 x 6 mm (series 8, image 98). This is directed laterally and slightly posteriorly. ICA termini patent bilaterally, tortuous on the left. A1 segments patent bilaterally. Normal anterior communicating artery complex. Anterior cerebral arteries patent to their distal aspects without flow-limiting stenosis. No M1 occlusion or stenosis. Normal MCA bifurcations. Distal MCA branches well perfused and fairly symmetric. Posterior circulation: Bilateral V4 segments demonstrate scattered multifocal atheromatous irregularity without high-grade stenosis. Posterior inferior cerebral arteries patent bilaterally. Basilar artery somewhat diminutive but widely patent to its distal aspect. Superior cerebral arteries patent bilaterally. Fetal type origin of the left PCA supplied via a widely patent left posterior communicating artery. Hypoplastic right P1 with prominent right posterior communicating artery. PCAs patent to their distal aspects without occlusion or high-grade stenosis. Venous sinuses:  Grossly patent. Anatomic variants: Predominant fetal type origin of the PCAs with somewhat diminutive vertebrobasilar system. Delayed phase: No abnormal enhancement. Right parieto-occipital cranioplasty with underlying encephalomalacia noted. Review of the MIP images confirms the above findings IMPRESSION: 1. Negative CTA for large vessel occlusion. No hemodynamically significant or correctable stenosis identified. 2. Bilateral cavernous ICA aneurysms as above, measuring up to 3 mm on the right and 6 mm on the left. 3. Diffuse tortuosity of the major arterial vasculature of the head and neck, suggesting underlying chronic hypertension. 4. Scattered ground-glass opacity with interlobular septal thickening within the visualized lungs, compatible with pulmonary interstitial edema. Correlation with plain film radiography suggested. Electronically Signed   By: Jeannine Boga M.D.   On: 07/17/2018 05:27   Mr Brain Wo Contrast  Result Date: 07/17/2018 CLINICAL DATA:  Initial evaluation for acute altered mental status, unresponsive. History of seizure. EXAM: MRI HEAD WITHOUT CONTRAST TECHNIQUE: Multiplanar, multiecho pulse sequences of the brain and surrounding structures were obtained without intravenous contrast. COMPARISON:  Prior CTA from earlier same day as well as previous examinations. FINDINGS: Brain: Diffuse prominence of the CSF containing spaces compatible with generalized cerebral atrophy. Sequelae of prior right parieto-occipital craniectomy with cranioplasty. Underlying encephalomalacia within the right parietal region. Increased diffusion signal abnormality seen within the mesial right temporal lobe involving the right hippocampus (series 5, image 50). Associated mildly increased FLAIR signal abnormality. Findings favored to reflect sequelae of acute seizure, although ischemia not entirely excluded. No significant mass effect. Incidental note made of a punctate 4 mm focus of mild diffusion  abnormality within the inferior right cerebellum (series 5, image 40), suggestive of a small concomitant acute to early subacute ischemic infarct. No associated mass effect or hemorrhage. No other evidence for acute or subacute ischemia. Gray-white matter differentiation otherwise maintained. No evidence for acute or  chronic intracranial hemorrhage on this motion degraded exam. No mass lesion, midline shift or mass effect. No hydrocephalus. No extra-axial fluid collection. Pituitary gland within normal limits. Vascular: Major intracranial vascular flow voids maintained. Skull and upper cervical spine: Craniocervical junction normal. Prominent degenerative changes noted about the C1-2 articulation. No acute scalp soft tissue abnormality. Prior right parieto-occipital cranioplasty. Sinuses/Orbits: Globes and orbital soft tissues demonstrate no acute finding. Patient status post bilateral ocular lens replacement. Mild scattered mucosal thickening within the ethmoidal air cells. Paranasal sinuses are otherwise clear. Left mastoid effusion noted, of doubtful significance. Other: None. IMPRESSION: 1. Increased diffusion abnormality involving the mesial right temporal lobe/hippocampus. Given patient history, findings favored to reflect the sequelae of acute seizure/status epilepticus. Correlation with EEG recommended. Differential considerations include sequelae of acute ischemia or herpes encephalitis. Correlation with CSF analysis recommended as warranted. 2. Punctate 4 mm focus of diffusion abnormality within the inferior right cerebellum, suggestive of a small concomitant acute to early subacute ischemic nonhemorrhagic infarct. 3. Prior right parieto-occipital cranioplasty with underlying encephalomalacia. Electronically Signed   By: Jeannine Boga M.D.   On: 07/17/2018 05:55      Benay Pike, MD 07/17/2018, 6:32 AM PGY-1, [place] Intern pager: [phone], text pages welcome

## 2018-07-17 NOTE — Progress Notes (Signed)
I saw and examined Ms Roes - I am her PCP.  She is complicated: Acute issue: 1. Found down.  Per neighbor, [name], patient had been out and driving earlier in the day.  Did not return calls that evening.  She and another neighbor went to house and found her down.  Differential Dx is long.  Most likely seizure.  She may have had one previous seizure.  She has reasons to be at risk: Previous concussion and previous meningioma surg.  Discussed with neuro.  Load with Keppra and observe.  Broaden the differential if she does not awaken as expected.  Neuro does not think that this is an acute CVA. 2. New A fib with RVR will need treatment. Work with neuro re: anticoag risk benefit since he has ICA aneurysms.    Chronic issues: 1. Depression: Longstanding.  Very dismayed by the current state of the world (Warning: avoid politics. Angry and blames Trump for a terrible decline in our society.) She will say frequently that life is not worth living - and in the next breath will add that she would not do anything to harm herself.  There is absolutely no indication that her being found down was an intentional suicide on her part.  She is not actively suicidal. 2. End-of-life care.  She is an interesting paradox.  With her depression, she will often say that she does not want to live.  But when I asked today and previously about code status, she states that she would not want to be kept alive if she had no quality of life.  She would want to be resuscitated for an acute event from which she could rebound. 3. Donnalee Curry, indicates Ms. Uyeno has fallen 2-3 times in the last month.  [name] lives alone in a single family house.  States that home upkeep has become overwhelming.  Her daughter in [place] has offered to have [place] come live with her.  [name] also considers the idea of packing up, selling her home and moving to [place] overwhelming.    Plan: Keppra and observe.  Hopefully, she will awaken fully and  sx will resolve in next 24 hours.  Definitely needs PT eval and may need short term rehab.  Involve daughter in [place] about long term plans since living independantly is increasingly risky for her.  [facility] psych consult.  In the past she has refused: "I'm not crazy.  I just see no reason to live in this damn crazy world."

## 2018-07-17 NOTE — H&P (Addendum)
[facility] Admission History and Physical Service Pager: [phone]  Patient name: [name] Medical record number: [id number] Date of birth: 12-13-1930 Age: 84 y.o. Gender: female  Primary Care Provider: [name], MD Consultants: Cardiology, Neurology Code Status: full  Chief Complaint: AMS, found down at home  Assessment and Plan: GINETTE BRADWAY is a 84 y.o. female presenting with altered mental status and left sided focal deficits (UE and LE) in addition to sepsis and volume overload suggestive of new onset heart failure. PMH is significant for encephalomalacia of the right parietal lobe status post craniotomy for meningioma, seizure d/o, CAD (mild plaque in 03/2012 cath), frequent falls and hypertension, subdural hematoma, HLD  AMS with left sided focal deficits -per the ED physician, [name] was found down at home and brought into the emergency department.  She could not provide a history of her present illness due to altered mental status.  Physical exam is remarkable for orientation to name only, anisocoria, left-sided weakness in upper and lower extremities, tachycardia, tachypnea.  Admission labs are remarkable for WBC: 16.2, lactic acid: 3.25.  Chest x-ray showed possible right lobar opacity.  Head CT showed no evidence of hemorrhagic stroke. CHA2DS2-VASc score of 5.  Differential at this time includes acute ischemic stroke, sepsis, seizure.  Seizure remains difficult to rule out based on lack of historic information.  It is unclear if she lost bowel or bladder function or if she is currently post ictal.  Sepsis is certainly contributing to this altered mental status although would not explain her focal deficits.  Acute ischemic stroke the most likely diagnosis at this time.  Neurology has already been consulted and recommended moving forward with MR brain and CTA head neck, no anticoagulation at this time. -Admit to FMTS, attending Dr.  Erin Hearing -f/u neurology recs -f/u CTA head/neck -f/u MR brain -holding all neuroactive medication (adderall, gabapentin, prozac) -continue sepsis treatment per below -PT/OT -Swallow screen  --Frequent neuro checks  --Risk start labs: TSH, lipid panel, A1c --UDS pending --F/u on CK -ECHO  Sepsis, unclear source-history remains unclear due to altered mental status. Vitals are remarkable for tachycardia in the 120s, tachypnea up to 25 breaths/min and altered mental status, blood pressures remained elevated as high as 153/65.  Labs are remarkable for WBC: 00.9 with neutrophilic shift, lactic acid 3.25, AG: 13.  Chest x-ray was suggestive of right middle lobe opacity/atelectasis.  UA pending.  In the ED, she was given 500 mL NS bolus and started on broad-spectrum antibiotics (vancomycin, metronidazole, aztreonam due to penicillin allergy).  Repeat lactic acid after IVF was 2.2.  Most likely infectious sources include pneumonia or UTI.  There were no obvious skin infections on exam.  Blood cultures and urine culture pending. -now receiving sepsis protocol 30 ml/kg NS bolus  -f/u procalcitonin, legionella antigen, S. pneumo antigen -Continue Vanc, aztreonam, metronidazole -Trend lactic acid -Follow-up blood cultures, urine culture -Narrow antibiotics as appropriate  Volume overload likely new onset heart failure - she has no history of heart failure though she does have a history of CAD with her last heart cath being in 2013 (25% lesions in RCA).  On presentation she is tachypneic up to 25.  Physical exam was remarkable for elevated JVD, lower extremity edema trace to +1, respiratory exam was technically difficult but no crackles were appreciated.  Labs are remarkable for istat trop:0.42, troponin: 0.59, BNP: 593.  Chest x-ray was remarkable for diffused pulmonary congestion.  EKG was remarkable for  sinus tachycardia.  The current team and cardiology disagree with the initial EKG interpretation of A.  fib. Pulmonary congestion in this patient certainly exacerbated by sepsis.  Treatment at this time favors treating the sepsis over the heart failure exacerbation.  Presently holding Lasix in favor of avoiding end-organ damage secondary to sepsis. Cardiology fellow was consulted suggested trending trop and repeating EKG in the morning.   -Consult cardiology in a.m. -Trend troponin -holding lasix for now in favor of sepsis treatment -Follow-up risk stratification labs -Cardiac monitoring  -ECHO -AM EKG  Acute hypercarbic respiratory failure -patient was found down at home, she is found to be hypoxic by EMS.  During transport, she was put on a nonrebreather mask.  In the ED, initial ABG showed 7.25/53/100 consistent with hypercapnic respiratory failure.  BiPAP was started and repeat ABG after 2 hours showed 7.29/49/113.  Her respiratory acidosis is likely secondary to possible pneumonia in conjunction with volume overload seen on CXR. . -continue BiPAP -f/u ABG @0500  -Discontinue Bipap when clinically warranted  Elevated troponins, acute iTrop on admission 0.42. Repeat trop 0.59. Patient with a remote history of NSTEMI. EKG with no evidence of acute ischemia. Unable to assess for chest pain or other cardiac symptoms given AMS. Elevation in cardiac enzymes could be secondary to sepsis on presentation Cardiology consulted and suggested continue trending trops with repeat EKG in the morning. Discourage use of heparin given possible stroke.  --Consult cards in the morning --Trend trop  --Repeat EKG am  Hypertension -initial blood pressure in the hospital 138/58.  BP has been as high as 153/65.  Home medication includes hydrochlorothiazide 12.5 mg daily. Given significant neuro deficit highly suggestive of stroke will allow for permissive hypertension. -Holding HCTZ in the setting of possible stroke --Resume when clinically appropriate  Hyperlipidemia - last lipid panel showed total cholesterol: 174,  LDL: 107, HDL: 45, tri: 112.  She takes atorvastatin 10 mg at home. Holding atorvastatin --Follow on lipid panel --Patient will likely need to be on higher dose given likely stroke  Chronic pain -home pain control appears to include gabapentin 600 mg BID. Patient with know left shoulder rotator cuff arthropathy with scheduled operation for replacement by ortho. -holding gabapentin in the setting of AMS -Resume when clinically appropriate, might need dose adjustment  Bipolar 1 disorder/depression -review shows that she has a history of bipolar 1.  Her only psychiatric medication is Prozac.  Holding Prozac for now.  SSRI monotherapy for bipolar is not recommended. -Holding Prozac  FEN/GI: NPO until patient passed swallow screen Prophylaxis: No pharmacologic prophylaxis at this time, SCDs only  Disposition: Discharge to SNF vs Home w/ [facility] pending clinical improvement   History of Present Illness:  CALYSE MURCIA is a 84 y.o. female presenting with altered mental status.  Very little history was obtained from the patient due to altered mental status.  Majority of information was obtained from the ED physician and EMS.  Per the ED physician, Ms. Zamarron came in after being found down at home.  She was last seen by a friend on 12/4.  She currently lives independently with majority of her family living in [place].  Her friends went to look for her after she failed to show up at a dinner arrangement on 12/5.  The details of her being found were not related.  She is brought to the hospital via EMS.  In the emergency department, she is found to have unequal pupils and altered mental status.  CT head  do not show any evidence of hemorrhagic stroke.  MRI brain pending.  Patient was found to be tachypneic and tachycardic in addition to a white blood cell count of 16.2 and a lactic acid of 3.25.  500 mL bolus of normal saline was given, patient was started on BiPAP, broad-spectrum antibiotics were  started-vancomycin, aztreonam, metronidazole.  Additional significant findings include an itrop of 0.42 and a BNP of 593. EKG showed tachycardia, chest x-ray shows patchy opacity in the right lung suspicious for atelectasis or infiltrate in addition to hilar adenopathy and diffuse pulmonary congestion.  Review Of Systems: Unable to obtain review of systems due to altered mental status.  ROS  Patient Active Problem List   Diagnosis Date Noted  . Preoperative general physical examination 12/25/2017  . Frequent falls 12/25/2017  . Weight loss, non-intentional 08/14/2017  . S/P shoulder replacement 03/29/2016  . Diverticulitis of colon without hemorrhage 12/04/2015  . Rotator cuff syndrome of left shoulder 10/26/2015  . Cervical disc disorder with radiculopathy of cervical region 10/26/2015  . Heart murmur, systolic 40/98/1191  . Rosacea 09/07/2015  . Adenomatous colon polyp 04/12/2014  . Hearing loss in left ear 04/13/2013  . Coronary artery disease 03/25/2012  . Osteoporosis 11/13/2011  . ADD (attention deficit disorder) 02/22/2011  . Pure hypercholesterolemia 10/09/2006  . Major depression, recurrent, chronic ([place]) 10/09/2006  . Hereditary and idiopathic peripheral neuropathy 10/09/2006  . HYPERTENSION, BENIGN SYSTEMIC 10/09/2006  . OSTEOARTHRITIS, MULTI SITES 10/09/2006  . CONVULSIONS, SEIZURES, NOS 10/09/2006    Past Medical History: Past Medical History:  Diagnosis Date  . Anemia   . Asthma    once in 1950's  . AVM (arteriovenous malformation)   . Cancer (HCC)     right leg  skin  . Coronary artery disease   . Depression    sucide attempt in the distant past  . Excessive sweating   . GI bleed   . Hiatal hernia    s/p repair  . HLD (hyperlipidemia)   . HTN (hypertension)   . Hypertension   . Low back pain    ESI by Dr. Nelva Bush 2 weeks ago  . OA (osteoarthritis)    bilateral wrist, bilateral knees  . Obesity   . Obesity   . Ovarian cyst   . Right wrist injury     multiple surgeries and residual weakness.   . Rotator cuff arthropathy    right  . Seizures ([facility]) 1980s   2/2 meningioma. none since cranioplasty  . Tuberculosis    + tb test      (father had)    Past Surgical History: Past Surgical History:  Procedure Laterality Date  . ABDOMINAL HYSTERECTOMY     left ovaries  . BREAST REDUCTION SURGERY    . CATARACT EXTRACTION Bilateral   . CRANIOPLASTY     2/2 meningioma 1980s  . CYSTECTOMY     rt ovary  age 6   . HIATAL HERNIA REPAIR    . KNEE ARTHROPLASTY     bilat  . LEFT HEART CATHETERIZATION WITH CORONARY ANGIOGRAM N/A 03/26/2012   Procedure: LEFT HEART CATHETERIZATION WITH CORONARY ANGIOGRAM;  Surgeon: Minus Breeding, MD;  Location: [facility] CATH LAB;  Service: Cardiovascular;  Laterality: N/A;  . REVERSE SHOULDER ARTHROPLASTY Right 03/29/2016  . REVERSE SHOULDER ARTHROPLASTY Right 03/29/2016   Procedure: RIGHT REVERSE SHOULDER ARTHROPLASTY;  Surgeon: Netta Cedars, MD;  Location: [facility];  Service: Orthopedics;  Laterality: Right;    Social History: Social History   Tobacco Use  .  Smoking status: Never Smoker  . Smokeless tobacco: Never Used  Substance Use Topics  . Alcohol use: No  . Drug use: No    Family History: Family History  Problem Relation Age of Onset  . Alzheimer's disease Mother   . Heart disease Father        MI 14yo  . Heart disease Brother    Allergies and Medications: Allergies  Allergen Reactions  . Amoxicillin Anaphylaxis  . Penicillins Anaphylaxis and Swelling    From childhood: Has patient had a PCN reaction causing immediate rash, facial/tongue/throat swelling, SOB or lightheadedness with hypotension: Yes Has patient had a PCN reaction causing severe rash involving mucus membranes or skin necrosis: Unk Has patient had a PCN reaction that required hospitalization: No Has patient had a PCN reaction occurring within the last 10 years: No If all of the above answers are "NO", then may proceed with  Cephalosporin use. .  . Phenobarbital Other (See Comments)    Reaction not recalled by the patient  . Phenytoin Other (See Comments)    "Makes me feel funny"   . Tape Rash    Please use paper tape   No current facility-administered medications on file prior to encounter.    Current Outpatient Medications on File Prior to Encounter  Medication Sig Dispense Refill  . amphetamine-dextroamphetamine (ADDERALL) 10 MG tablet Take 1 tablet (10 mg total) by mouth daily. 90 tablet 0  . atorvastatin (LIPITOR) 10 MG tablet Take 1 tablet (10 mg total) by mouth daily. 90 tablet 3  . Azelaic Acid 15 % cream Apply 1 application topically daily.  2  . calcium-vitamin D (OSCAL WITH D) 500-200 MG-UNIT tablet Take 1 tablet by mouth daily with breakfast.    . Coenzyme Q10 (COQ10) 200 MG CAPS Take 200 mg by mouth daily.     Marland Kitchen FLUoxetine (PROZAC) 20 MG tablet Take 1 tablet (20 mg total) by mouth daily. 90 tablet 3  . gabapentin (NEURONTIN) 600 MG tablet Take 1 tablet (600 mg total) by mouth 2 (two) times daily. 180 tablet 3  . hydrochlorothiazide (MICROZIDE) 12.5 MG capsule TAKE ONE CAPSULE BY MOUTH EVERY DAY (Patient taking differently: Take 12.5 mg by mouth once a day) 90 capsule 3  . latanoprost (XALATAN) 0.005 % ophthalmic solution Place 1 drop into both eyes at bedtime.  11  . magnesium 30 MG tablet Take 30 mg by mouth 2 (two) times daily.      Objective: BP 134/68   Pulse (!) 110   Temp 99.4 F (37.4 C) (Rectal)   Resp (!) 9   Ht 5\' 5"  (1.651 m)   Wt 81.6 kg   SpO2 98%   BMI 29.95 kg/m  Exam: Physical Exam  Constitutional: She appears distressed.  Eyes:  Left pupil significantly larger than right.  Pupils round and reactive to light bilaterally.  Neck: JVD present.  Cardiovascular: Normal heart sounds.  Tachycardic, rhythm regular  Pulmonary/Chest: No stridor. She is in respiratory distress. She has no wheezes.  Musculoskeletal: She exhibits edema (2+ pitting edema bilaterally).   Neurological: She is alert. She is disoriented. A cranial nerve deficit is present. Sensory deficit: unable to assess. GCS eye subscore is 4. GCS verbal subscore is 5. GCS motor subscore is 6.  Grip strength and bicep strength on left side is 0/5 compared to 4/5 on the right.  Plantar flexion 0/5 on left compared to 4/5 on right  Skin: She is diaphoretic.     Labs and Imaging:  CBC BMET  Recent Labs  Lab 07/16/18 2358  WBC 16.2*  HGB 12.6  HCT 40.7  PLT 286   Recent Labs  Lab 07/16/18 2358  NA 135  134*  K 3.6  3.6  CL 102  100  CO2 20*  21*  BUN 18  18  CREATININE 0.95  0.92  GLUCOSE 192*  192*  CALCIUM 8.9  8.8*     Dg Chest 1 View  IMPRESSION: 1. Patchy opacity at the medial right lung base, favored to reflect atelectasis, although infiltrate could be considered in the correct clinical setting. 2. Prominent opacity along the right paratracheal stripe. While this finding may be related to rotation and/or underlying normal vasculature, possible adenopathy or mass could also have this appearance. Further assessment with dedicated cross-sectional imaging suggested. 3. Cardiomegaly with mild diffuse pulmonary interstitial congestion without frank alveolar edema. Electronically Signed   By: Jeannine Boga M.D.   On: 07/16/2018 23:05   Ct Head Wo Contrast  IMPRESSION: 1. No acute intracranial abnormality identified. 2. Stable right parietal encephalomalacia and cranioplasty postsurgical changes. 3. Stable chronic microvascular ischemic changes and volume loss of the brain. Electronically Signed   By: Kristine Garbe M.D.   On: 07/16/2018 22:41     I have seen and evaluated the patient with Dr. Pilar Plate. I am in agreement with the note above in its revised form. My additions are in blue.  Marjie Skiff, MD Family Medicine, PGY-3  Matilde Haymaker, MD 07/17/2018, 1:54 AM PGY-1, [facility] pager: [phone], text pages welcome

## 2018-07-17 NOTE — Consult Note (Addendum)
Cardiology Consultation:   Patient ID: SHEVY YANEY MRN: [id number]; DOB: March 21, 1931  Admit date: 07/16/2018 Date of Consult: 07/17/2018  Primary Care Provider: [name], MD Primary Cardiologist: [facility] to [facility]   Patient Profile:   [name] is a 84 y.o. female with a hx of  meningioma status post craniotomy on the right side, subdural hemorrhage with resulting right parietal encephalomalacia, seizure disorders, HTN, HLD, depression, falls and prior suicidal Ideation  who is being seen today for the evaluation of Elevated troponin at the request of Dr. Erin Hearing.   Mild coronary plaque by cath in 2013 by Dr. Percival Spanish as below. No follow up. Coronary angiography:   Coronary dominance: Right  Left mainstem:   Normal  Left anterior descending (LAD):   Mild diffuse luminal irregularities.  Mid diagonal moderate sized branching and normal.  Left circumflex (LCx):  AV groove mild luminal irregularities.  Ramus intermedius small and normal.  MOM large and normal.  Right coronary artery (RCA):  Large vessel.  Scattered focal 25% lesions throughout.  Large acute marginal normal.  PDA off of AM normal.  Small posterolaterals x 2 normal.  Left ventriculography: Left ventricular systolic function is normal, LVEF is estimated at 65%, there is no significant mitral regurgitation   Final Conclusions:  Mild coronary plaque.  Normal LV function.    Recommendations:   Continued risk reduction.     Echocardiogram February 2017 showed LV function of 60 to 65%, no wall motion abnormality and grade 1 diastolic dysfunction  History of Present Illness:   [name] founded down by neighbors at home yesterday evening 12/5 and brought to ER.  Her neighbor has spoken to patient on 07/15/2018.  Patient had a fall few days ago.  Patient is drowsy and history obtained from reviewing chart.  EMS found her tachycardic and unresponsive and brought to ER.  Upon arrival EKG read as atrial  fibrillation with rapid ventricular rate however personally review it looks like sinus tach.  MRI of brain suggest acute seizure status epilepticus as well as early subacute infract. 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 fib with RVR. She is also treated with broad-spectrum antibiotic for sepsis.  Currently patient is oriented to name and place.  Somewhat drowsy making difficult to obtain history.  Denies any chest pain, shortness of breath, palpitation, dizziness.  Pending psych consult and end of care life discussion.  CK 161.  Troponin I 0.59>> 0.81>> 0.89. BNP 593.3. Potassium 3.2.  Kidney function normal.  Lactic acid back to normal.  Hemoglobin A1c 5.  Past Medical History:  Diagnosis Date  . Anemia   . Asthma    once in 1950's  . AVM (arteriovenous malformation)   . Cancer (HCC)     right leg  skin  . Coronary artery disease   . Depression    sucide attempt in the distant past  . Excessive sweating   . GI bleed   . Hiatal hernia    s/p repair  . HLD (hyperlipidemia)   . HTN (hypertension)   . Hypertension   . Low back pain    ESI by Dr. Nelva Bush 2 weeks ago  . OA (osteoarthritis)    bilateral wrist, bilateral knees  . Obesity   . Obesity   . Ovarian cyst   . Right wrist injury    multiple surgeries and residual weakness.   . Rotator cuff arthropathy  right  . Seizures ([place]) 1980s   2/2 meningioma. none since cranioplasty  . Tuberculosis    + tb test      (father had)    Past Surgical History:  Procedure Laterality Date  . ABDOMINAL HYSTERECTOMY     left ovaries  . BREAST REDUCTION SURGERY    . CATARACT EXTRACTION Bilateral   . CRANIOPLASTY     2/2 meningioma 1980s  . CYSTECTOMY     rt ovary  age 7   . HIATAL HERNIA REPAIR    . KNEE ARTHROPLASTY     bilat  . LEFT HEART CATHETERIZATION WITH CORONARY ANGIOGRAM N/A 03/26/2012   Procedure: LEFT HEART CATHETERIZATION WITH CORONARY ANGIOGRAM;   Surgeon: Minus Breeding, MD;  Location: [facility] CATH LAB;  Service: Cardiovascular;  Laterality: N/A;  . REVERSE SHOULDER ARTHROPLASTY Right 03/29/2016  . REVERSE SHOULDER ARTHROPLASTY Right 03/29/2016   Procedure: RIGHT REVERSE SHOULDER ARTHROPLASTY;  Surgeon: Netta Cedars, MD;  Location: [facility];  Service: Orthopedics;  Laterality: Right;    Inpatient Medications: Scheduled Meds: . atorvastatin  40 mg Oral q1800  . iopamidol       Continuous Infusions: . aztreonam 1 g (07/17/18 0954)  . levETIRAcetam 500 mg (07/17/18 0654)  . metronidazole Stopped (07/17/18 0305)  . vancomycin     PRN Meds: acetaminophen **OR** acetaminophen (TYLENOL) oral liquid 160 mg/5 mL **OR** acetaminophen  Allergies:    Allergies  Allergen Reactions  . Amoxicillin Anaphylaxis  . Penicillins Anaphylaxis and Swelling    From childhood: Has patient had a PCN reaction causing immediate rash, facial/tongue/throat swelling, SOB or lightheadedness with hypotension: Yes Has patient had a PCN reaction causing severe rash involving mucus membranes or skin necrosis: Unk Has patient had a PCN reaction that required hospitalization: No Has patient had a PCN reaction occurring within the last 10 years: No If all of the above answers are "NO", then may proceed with Cephalosporin use. .  . Phenobarbital Other (See Comments)    Reaction not recalled by the patient  . Phenytoin Other (See Comments)    "Makes me feel funny"   . Tape Rash    Please use paper tape    Social History:   Social History   Socioeconomic History  . Marital status: Divorced    Spouse name: Not on file  . Number of children: Not on file  . Years of education: Not on file  . Highest education level: Not on file  Occupational History  . Occupation: Retired- Health visitor  Social Needs  . Financial resource strain: Not on file  . Food insecurity:    Worry: Not on file    Inability: Not on file  . Transportation needs:    Medical: Not on file     Non-medical: Not on file  Tobacco Use  . Smoking status: Never Smoker  . Smokeless tobacco: Never Used  Substance and Sexual Activity  . Alcohol use: No  . Drug use: No  . Sexual activity: Never  Lifestyle  . Physical activity:    Days per week: Not on file    Minutes per session: Not on file  . Stress: Not on file  Relationships  . Social connections:    Talks on phone: Not on file    Gets together: Not on file    Attends religious service: Not on file    Active member of club or organization: Not on file    Attends meetings of clubs or organizations: Not on file  Relationship status: Not on file  . Intimate partner violence:    Fear of current or ex partner: Not on file    Emotionally abused: Not on file    Physically abused: Not on file    Forced sexual activity: Not on file  Other Topics Concern  . Not on file  Social History Narrative   Patient identified her friend, [name], as her emergency contact at [phone].   Lives   Caffeine use:     Family History:   Family History  Problem Relation Age of Onset  . Alzheimer's disease Mother   . Heart disease Father        MI 72yo  . Heart disease Brother      ROS:  Please see the history of present illness.  All other ROS reviewed and negative.     Physical Exam/Data:   Vitals:   07/17/18 0322 07/17/18 0522 07/17/18 0846 07/17/18 1202  BP: (!) 166/78 133/74 (!) 145/74 (!) 146/70  Pulse: 96 93 90 88  Resp: 18 20 20 18   Temp: 98.2 F (36.8 C) 98.9 F (37.2 C) 98.7 F (37.1 C) 98.3 F (36.8 C)  TempSrc: Oral Oral Oral Oral  SpO2: 95% 97% 96% 97%  Weight: 82.3 kg     Height: 5\' 5"  (1.651 m)       Intake/Output Summary (Last 24 hours) at 07/17/2018 1243 Last data filed at 07/17/2018 1000 Gross per 24 hour  Intake 3400 ml  Output 1600 ml  Net 1800 ml   Filed Weights   07/16/18 2206 07/17/18 0322  Weight: 81.6 kg 82.3 kg   Body mass index is 30.19 kg/m.  General: Ill-appearing drowsy  female in no acute distress  HEENT: normal Lymph: no adenopathy Neck: no JVD Endocrine:  No thryomegaly Vascular: No carotid bruits; FA pulses 2+ bilaterally without bruits  Cardiac:  normal S1, S2; RRR; 3/6 systolic murmur Lungs:  clear to auscultation bilaterally, no wheezing, rhonchi or rales  Abd: soft, nontender, no hepatomegaly  Ext: Trace edema Musculoskeletal:  No deformities, BUE and BLE strength normal and equal Skin: warm and dry  Neuro: Drowsy, answers sort question, alert and oriented to name and place Psych: Drowsy  EKG:  The EKG was personally reviewed and demonstrates: Sinus tachycardia versus atrial fibrillation at rate of 128 bpm Telemetry:  Telemetry was personally reviewed and demonstrates: Sinus rhythm with frequent PVC and PAC with intermittent tachycardia  Relevant CV Studies: Pending echocardiogram this admission  Laboratory Data:  Chemistry Recent Labs  Lab 07/16/18 2215 07/16/18 2358 07/17/18 0615  NA 135 135  134* 138  K 3.3* 3.6  3.6 3.2*  CL 101 102  100 103  CO2  --  20*  21* 24  GLUCOSE 280* 192*  192* 155*  BUN 19 18  18 13   CREATININE 0.80 0.95  0.92 0.70  CALCIUM  --  8.9  8.8* 8.1*  GFRNONAA  --  55*  57* >60  GFRAA  --  >60  >60 >60  ANIONGAP  --  13  13 11     Recent Labs  Lab 07/16/18 2207 07/16/18 2358  PROT 7.7 7.0  ALBUMIN 3.7 3.4*  AST 36 32  ALT 19 18  ALKPHOS 115 104  BILITOT 0.6 0.3   Hematology Recent Labs  Lab 07/16/18 2215 07/16/18 2358 07/17/18 0615  WBC  --  16.2* 13.5*  RBC  --  4.50 4.29  HGB 15.3* 12.6 12.1  HCT 45.0 40.7 38.2  MCV  --  90.4 89.0  MCH  --  28.0 28.2  MCHC  --  31.0 31.7  RDW  --  13.6 13.8  PLT  --  286 284   Cardiac Enzymes Recent Labs  Lab 07/16/18 2358 07/17/18 0615 07/17/18 1049  TROPONINI 0.59* 0.81* 0.89*    Recent Labs  Lab 07/16/18 2212  TROPIPOC 0.42*    BNP Recent Labs  Lab 07/16/18 2358  BNP 593.3*    DDimer No results for input(s): DDIMER  in the last 168 hours.  Radiology/Studies:  Ct Angio Head W Or Wo Contrast  Result Date: 07/17/2018 CLINICAL DATA:  Initial evaluation for acute altered mental status, found down. EXAM: CT ANGIOGRAPHY HEAD AND NECK TECHNIQUE: Multidetector CT imaging of the head and neck was performed using the standard protocol during bolus administration of intravenous contrast. Multiplanar CT image reconstructions and MIPs were obtained to evaluate the vascular anatomy. Carotid stenosis measurements (when applicable) are obtained utilizing NASCET criteria, using the distal internal carotid diameter as the denominator. CONTRAST:  28mL ISOVUE-370 IOPAMIDOL (ISOVUE-370) INJECTION 76% COMPARISON:  Prior CT from 07/16/2018 FINDINGS: CTA NECK FINDINGS Aortic arch: Visualized aortic arch of normal caliber with normal branch pattern. Moderate to advanced atheromatous plaque about the arch and origin of the great vessels without high-grade stenosis. Great vessels tortuous proximally. Visualized subclavian arteries patent without stenosis. Right carotid system: Right common carotid artery tortuous but widely patent to the bifurcation without stenosis. Calcified plaque about the right bifurcation without hemodynamically significant stenosis. Right ICA medialized into the retropharyngeal space and widely patent to the skull base without stenosis, dissection, or occlusion. Left carotid system: Left common carotid artery tortuous proximally but widely patent to the bifurcation. Concentric plaque about the left bifurcation without hemodynamically significant stenosis. Left ICA medialized into the retropharyngeal space but widely patent to the skull base without stenosis, dissection, or occlusion. Vertebral arteries: Both of the vertebral arteries arise from the subclavian arteries. Relatively mild stenosis at the origin of the right vertebral artery. Irregularity of the pre foraminal right V1 segment could be related atherosclerotic change  versus changes related to mild FMD. Vertebral arteries tortuous but patent to the vertebrobasilar junction without stenosis, dissection, or occlusion. Skeleton: No acute osseous abnormality. No discrete lytic or blastic osseous lesions. Moderate cervical spondylolysis at C4-5 through C6-7. Other neck: No acute soft tissue abnormality within the neck. Salivary glands within normal limits. No adenopathy. Thyroid within normal limits. Upper chest: Scattered ground-glass opacity with interlobular septal thickening within the visualized upper lungs, consistent with pulmonary interstitial edema. Partially visualized upper chest demonstrates no other acute finding. Review of the MIP images confirms the above findings CTA HEAD FINDINGS Anterior circulation: Petrous segments patent bilaterally. Mild scattered atheromatous plaque within the cavernous/supraclinoid ICAs without hemodynamically significant stenosis. 3 mm focal outpouching arising from the cavernous right ICA compatible with a small aneurysm (series 8, image 92). There is an additional fairly wide necked aneurysm arising from the cavernous left ICA measuring approximately 6 x 5 x 6 mm (series 8, image 98). This is directed laterally and slightly posteriorly. ICA termini patent bilaterally, tortuous on the left. A1 segments patent bilaterally. Normal anterior communicating artery complex. Anterior cerebral arteries patent to their distal aspects without flow-limiting stenosis. No M1 occlusion or stenosis. Normal MCA bifurcations. Distal MCA branches well perfused and fairly symmetric. Posterior circulation: Bilateral V4 segments demonstrate scattered multifocal atheromatous irregularity without high-grade stenosis. Posterior inferior cerebral arteries patent bilaterally. Basilar artery somewhat diminutive but widely  patent to its distal aspect. Superior cerebral arteries patent bilaterally. Fetal type origin of the left PCA supplied via a widely patent left  posterior communicating artery. Hypoplastic right P1 with prominent right posterior communicating artery. PCAs patent to their distal aspects without occlusion or high-grade stenosis. Venous sinuses: Grossly patent. Anatomic variants: Predominant fetal type origin of the PCAs with somewhat diminutive vertebrobasilar system. Delayed phase: No abnormal enhancement. Right parieto-occipital cranioplasty with underlying encephalomalacia noted. Review of the MIP images confirms the above findings IMPRESSION: 1. Negative CTA for large vessel occlusion. No hemodynamically significant or correctable stenosis identified. 2. Bilateral cavernous ICA aneurysms as above, measuring up to 3 mm on the right and 6 mm on the left. 3. Diffuse tortuosity of the major arterial vasculature of the head and neck, suggesting underlying chronic hypertension. 4. Scattered ground-glass opacity with interlobular septal thickening within the visualized lungs, compatible with pulmonary interstitial edema. Correlation with plain film radiography suggested. Electronically Signed   By: Jeannine Boga M.D.   On: 07/17/2018 05:27   Dg Chest 1 View  Result Date: 07/16/2018 CLINICAL DATA:  Initial evaluation for acute altered mental status. EXAM: CHEST  1 VIEW COMPARISON:  Prior radiograph from 07/15/2017. FINDINGS: Patient is rotated to the right. There is increased opacity along the right paratracheal stripe, more prevalent as compared to previous exam. Tortuosity the intrathoracic aorta with associated aortic atherosclerosis. Mild cardiomegaly. Lungs are hypoinflated. Patchy opacity at the medial right lung base favored to reflect atelectasis, although infiltrate could be considered. Mild diffuse pulmonary interstitial congestion without frank alveolar edema. No effusion. No pneumothorax. No acute osseous abnormality. Right shoulder arthroplasty noted. IMPRESSION: 1. Patchy opacity at the medial right lung base, favored to reflect  atelectasis, although infiltrate could be considered in the correct clinical setting. 2. Prominent opacity along the right paratracheal stripe. While this finding may be related to rotation and/or underlying normal vasculature, possible adenopathy or mass could also have this appearance. Further assessment with dedicated cross-sectional imaging suggested. 3. Cardiomegaly with mild diffuse pulmonary interstitial congestion without frank alveolar edema. Electronically Signed   By: Jeannine Boga M.D.   On: 07/16/2018 23:05   Ct Head Wo Contrast  Result Date: 07/16/2018 CLINICAL DATA:  84 y/o  F; found unresponsive. EXAM: CT HEAD WITHOUT CONTRAST TECHNIQUE: Contiguous axial images were obtained from the base of the skull through the vertex without intravenous contrast. COMPARISON:  08/13/2017 CT head FINDINGS: Brain: No evidence of acute infarction, hemorrhage, hydrocephalus, extra-axial collection or mass lesion/mass effect. Stable right parietal encephalomalacia. Stable chronic microvascular ischemic changes and volume loss of the brain. Vascular: Calcific atherosclerosis of carotid siphons. Skull: Chronic right parietal cranioplasty postsurgical changes. Stable small left frontal osteoma. Calvarium is otherwise unremarkable. Sinuses/Orbits: Stable chronic opacification of left mastoid tip. Additional visible paranasal sinuses and the mastoid air cells are normally aerated. Other: Bilateral intra-ocular lens replacement. IMPRESSION: 1. No acute intracranial abnormality identified. 2. Stable right parietal encephalomalacia and cranioplasty postsurgical changes. 3. Stable chronic microvascular ischemic changes and volume loss of the brain. Electronically Signed   By: Kristine Garbe M.D.   On: 07/16/2018 22:41   Ct Angio Neck W Or Wo Contrast  Result Date: 07/17/2018 CLINICAL DATA:  Initial evaluation for acute altered mental status, found down. EXAM: CT ANGIOGRAPHY HEAD AND NECK TECHNIQUE:  Multidetector CT imaging of the head and neck was performed using the standard protocol during bolus administration of intravenous contrast. Multiplanar CT image reconstructions and MIPs were obtained to evaluate the vascular anatomy.  Carotid stenosis measurements (when applicable) are obtained utilizing NASCET criteria, using the distal internal carotid diameter as the denominator. CONTRAST:  45mL ISOVUE-370 IOPAMIDOL (ISOVUE-370) INJECTION 76% COMPARISON:  Prior CT from 07/16/2018 FINDINGS: CTA NECK FINDINGS Aortic arch: Visualized aortic arch of normal caliber with normal branch pattern. Moderate to advanced atheromatous plaque about the arch and origin of the great vessels without high-grade stenosis. Great vessels tortuous proximally. Visualized subclavian arteries patent without stenosis. Right carotid system: Right common carotid artery tortuous but widely patent to the bifurcation without stenosis. Calcified plaque about the right bifurcation without hemodynamically significant stenosis. Right ICA medialized into the retropharyngeal space and widely patent to the skull base without stenosis, dissection, or occlusion. Left carotid system: Left common carotid artery tortuous proximally but widely patent to the bifurcation. Concentric plaque about the left bifurcation without hemodynamically significant stenosis. Left ICA medialized into the retropharyngeal space but widely patent to the skull base without stenosis, dissection, or occlusion. Vertebral arteries: Both of the vertebral arteries arise from the subclavian arteries. Relatively mild stenosis at the origin of the right vertebral artery. Irregularity of the pre foraminal right V1 segment could be related atherosclerotic change versus changes related to mild FMD. Vertebral arteries tortuous but patent to the vertebrobasilar junction without stenosis, dissection, or occlusion. Skeleton: No acute osseous abnormality. No discrete lytic or blastic osseous  lesions. Moderate cervical spondylolysis at C4-5 through C6-7. Other neck: No acute soft tissue abnormality within the neck. Salivary glands within normal limits. No adenopathy. Thyroid within normal limits. Upper chest: Scattered ground-glass opacity with interlobular septal thickening within the visualized upper lungs, consistent with pulmonary interstitial edema. Partially visualized upper chest demonstrates no other acute finding. Review of the MIP images confirms the above findings CTA HEAD FINDINGS Anterior circulation: Petrous segments patent bilaterally. Mild scattered atheromatous plaque within the cavernous/supraclinoid ICAs without hemodynamically significant stenosis. 3 mm focal outpouching arising from the cavernous right ICA compatible with a small aneurysm (series 8, image 92). There is an additional fairly wide necked aneurysm arising from the cavernous left ICA measuring approximately 6 x 5 x 6 mm (series 8, image 98). This is directed laterally and slightly posteriorly. ICA termini patent bilaterally, tortuous on the left. A1 segments patent bilaterally. Normal anterior communicating artery complex. Anterior cerebral arteries patent to their distal aspects without flow-limiting stenosis. No M1 occlusion or stenosis. Normal MCA bifurcations. Distal MCA branches well perfused and fairly symmetric. Posterior circulation: Bilateral V4 segments demonstrate scattered multifocal atheromatous irregularity without high-grade stenosis. Posterior inferior cerebral arteries patent bilaterally. Basilar artery somewhat diminutive but widely patent to its distal aspect. Superior cerebral arteries patent bilaterally. Fetal type origin of the left PCA supplied via a widely patent left posterior communicating artery. Hypoplastic right P1 with prominent right posterior communicating artery. PCAs patent to their distal aspects without occlusion or high-grade stenosis. Venous sinuses: Grossly patent. Anatomic variants:  Predominant fetal type origin of the PCAs with somewhat diminutive vertebrobasilar system. Delayed phase: No abnormal enhancement. Right parieto-occipital cranioplasty with underlying encephalomalacia noted. Review of the MIP images confirms the above findings IMPRESSION: 1. Negative CTA for large vessel occlusion. No hemodynamically significant or correctable stenosis identified. 2. Bilateral cavernous ICA aneurysms as above, measuring up to 3 mm on the right and 6 mm on the left. 3. Diffuse tortuosity of the major arterial vasculature of the head and neck, suggesting underlying chronic hypertension. 4. Scattered ground-glass opacity with interlobular septal thickening within the visualized lungs, compatible with pulmonary interstitial edema. Correlation with plain  film radiography suggested. Electronically Signed   By: Jeannine Boga M.D.   On: 07/17/2018 05:27   Mr Brain Wo Contrast  Result Date: 07/17/2018 CLINICAL DATA:  Initial evaluation for acute altered mental status, unresponsive. History of seizure. EXAM: MRI HEAD WITHOUT CONTRAST TECHNIQUE: Multiplanar, multiecho pulse sequences of the brain and surrounding structures were obtained without intravenous contrast. COMPARISON:  Prior CTA from earlier same day as well as previous examinations. FINDINGS: Brain: Diffuse prominence of the CSF containing spaces compatible with generalized cerebral atrophy. Sequelae of prior right parieto-occipital craniectomy with cranioplasty. Underlying encephalomalacia within the right parietal region. Increased diffusion signal abnormality seen within the mesial right temporal lobe involving the right hippocampus (series 5, image 50). Associated mildly increased FLAIR signal abnormality. Findings favored to reflect sequelae of acute seizure, although ischemia not entirely excluded. No significant mass effect. Incidental note made of a punctate 4 mm focus of mild diffusion abnormality within the inferior right  cerebellum (series 5, image 40), suggestive of a small concomitant acute to early subacute ischemic infarct. No associated mass effect or hemorrhage. No other evidence for acute or subacute ischemia. Gray-white matter differentiation otherwise maintained. No evidence for acute or chronic intracranial hemorrhage on this motion degraded exam. No mass lesion, midline shift or mass effect. No hydrocephalus. No extra-axial fluid collection. Pituitary gland within normal limits. Vascular: Major intracranial vascular flow voids maintained. Skull and upper cervical spine: Craniocervical junction normal. Prominent degenerative changes noted about the C1-2 articulation. No acute scalp soft tissue abnormality. Prior right parieto-occipital cranioplasty. Sinuses/Orbits: Globes and orbital soft tissues demonstrate no acute finding. Patient status post bilateral ocular lens replacement. Mild scattered mucosal thickening within the ethmoidal air cells. Paranasal sinuses are otherwise clear. Left mastoid effusion noted, of doubtful significance. Other: None. IMPRESSION: 1. Increased diffusion abnormality involving the mesial right temporal lobe/hippocampus. Given patient history, findings favored to reflect the sequelae of acute seizure/status epilepticus. Correlation with EEG recommended. Differential considerations include sequelae of acute ischemia or herpes encephalitis. Correlation with CSF analysis recommended as warranted. 2. Punctate 4 mm focus of diffusion abnormality within the inferior right cerebellum, suggestive of a small concomitant acute to early subacute ischemic nonhemorrhagic infarct. 3. Prior right parieto-occipital cranioplasty with underlying encephalomalacia. Electronically Signed   By: Jeannine Boga M.D.   On: 07/17/2018 05:55    Assessment and Plan:   1. Altered mental status/left-sided focal deficit/syncope -Differential includes status epilepticus, stroke, arrhythmia or ACS.  Pending  echocardiogram.  Work-up undergoing per primary team.  Her mental status is improving gradually.  2.  Arrhythmia -EKG read as atrial fibrillation at rate of 128 bpm however seems like sinus tachycardia.  Telemetry shows sinus rhythm at baseline with frequent PACs with PVC and intermittent tachycardia.  Will review in detail with MD. Her CHADSVASC score of 6.  Prior history of hemorrhagic stroke and recent fall.  No clear indication for starting anticoagulation currently.  3.  Elevated troponin -Mild plaque by cardiac catheterization in 2013.  History unreliable due to EMS however denies chest pain or shortness of breath.  No acute ischemic changes noted on EKG.  Troponin 0.59>>0.81>>0.89. Pending echocardiogram.  Further evaluation based on patient recovery and end of Life discussion with family.  4.  Systolic murmur -Pending echo  5. Punctate acute to early subacute ischemic nonhemorrhagic stroke in the cerebellum/seizure - Per neurology  6. Sepsis  - Per primary team   7. Depression - Pending psych eval      For questions or updates, please  contact [place] Please consult [email or web address] for contact info under     SignedLeanor Kail, PA  07/17/2018 12:43 PM   ---------------------------------------------------------------------------------------------   History and all data above reviewed.  Patient examined.  I agree with the findings as above.  [name] has had AMS and is extremely anxious for my exam. I am unable to get any collaborative history due to patient's heightened anxiety.  Constitutional: Anxious and talking about active suicidal thoughts. Eyes: pupils equally round and reactive to light, sclera non-icteric, normal conjunctiva and lids ENMT: normal dentition, moist mucous membranes Cardiovascular: regular rhythm, normal rate, no murmurs. S1 and S2 normal. Radial pulses normal bilaterally. No jugular venous distention.  Respiratory: clear to  auscultation bilaterally GI : normal bowel sounds, soft and nontender. No distention.   MSK: extremities warm, well perfused. No edema.  NEURO: grossly nonfocal exam, moves all extremities. PSYCH: alert and oriented x 3, very anxious. Says "I don't want to be in this life anymore"  All available labs, radiology testing, previous records reviewed. Agree with documented assessment and plan of my colleague as stated above with the following additions or changes:  Active Problems:   Focal neurological deficit   Altered mental status   Pain    Plan: with regard to elevated troponin, this could be in the setting of new stroke vs being found down. With her challenging mental status currently, medical management would be best.   She has had ECGs that appear more consistent with sinus tachycardia than atrial fibrillation. No clear indication for [facility]. We will await the finding of her echocardiogram before proceeding with any further cardiovascular testing or medications.   Length of Stay:  LOS: 1 day   Elouise Munroe, MD [facility]

## 2018-07-17 NOTE — Consult Note (Signed)
Requesting Physician: Dr. Erin Hearing    Chief Complaint: left hemiparesis   History obtained from: Patient and Chart     HPI:                                                                                                                                       [name] is an 83 y.o. female with past medical history significant for hypertension, hyperlipidemia, coronary artery disease, AVM, right parietal meningioma status post resection, right subdural hemorrhage, seizures presents to the ED with altered mental status after being found by family and noted to be unresponsive.  EMS was called and patient was brought to the ER.  Code stroke was not initiated as last known normal was 2 days ago.  On assessment in the emergency room, patient was noted to have left-sided weakness and unequal pupils.  She was not following commands, however mental status gradually improved.  She was noted to be in A. fib with RVR, also noted to be in hypercapnic respiratory failure with respiratory acidosis.  BNP was elevated and thought to be in new onset heart failure.    Past Medical History:  Diagnosis Date  . Anemia   . Asthma    once in 1950's  . AVM (arteriovenous malformation)   . Cancer (HCC)     right leg  skin  . Coronary artery disease   . Depression    sucide attempt in the distant past  . Excessive sweating   . GI bleed   . Hiatal hernia    s/p repair  . HLD (hyperlipidemia)   . HTN (hypertension)   . Hypertension   . Low back pain    ESI by Dr. Nelva Bush 2 weeks ago  . OA (osteoarthritis)    bilateral wrist, bilateral knees  . Obesity   . Obesity   . Ovarian cyst   . Right wrist injury    multiple surgeries and residual weakness.   . Rotator cuff arthropathy    right  . Seizures ([place]) 1980s   2/2 meningioma. none since cranioplasty  . Tuberculosis    + tb test      (father had)    Past Surgical History:  Procedure Laterality Date  . ABDOMINAL HYSTERECTOMY     left ovaries   . BREAST REDUCTION SURGERY    . CATARACT EXTRACTION Bilateral   . CRANIOPLASTY     2/2 meningioma 1980s  . CYSTECTOMY     rt ovary  age 28   . HIATAL HERNIA REPAIR    . KNEE ARTHROPLASTY     bilat  . LEFT HEART CATHETERIZATION WITH CORONARY ANGIOGRAM N/A 03/26/2012   Procedure: LEFT HEART CATHETERIZATION WITH CORONARY ANGIOGRAM;  Surgeon: Minus Breeding, MD;  Location: [facility] CATH LAB;  Service: Cardiovascular;  Laterality: N/A;  . REVERSE SHOULDER ARTHROPLASTY Right 03/29/2016  . REVERSE SHOULDER ARTHROPLASTY Right 03/29/2016   Procedure:  RIGHT REVERSE SHOULDER ARTHROPLASTY;  Surgeon: Netta Cedars, MD;  Location: [facility];  Service: Orthopedics;  Laterality: Right;    Family History  Problem Relation Age of Onset  . Alzheimer's disease Mother   . Heart disease Father        MI 65yo  . Heart disease Brother    Social History:  reports that she has never smoked. She has never used smokeless tobacco. She reports that she does not drink alcohol or use drugs.  Allergies:  Allergies  Allergen Reactions  . Amoxicillin Anaphylaxis  . Penicillins Anaphylaxis and Swelling    From childhood: Has patient had a PCN reaction causing immediate rash, facial/tongue/throat swelling, SOB or lightheadedness with hypotension: Yes Has patient had a PCN reaction causing severe rash involving mucus membranes or skin necrosis: Unk Has patient had a PCN reaction that required hospitalization: No Has patient had a PCN reaction occurring within the last 10 years: No If all of the above answers are "NO", then may proceed with Cephalosporin use. .  . Phenobarbital Other (See Comments)    Reaction not recalled by the patient  . Phenytoin Other (See Comments)    "Makes me feel funny"   . Tape Rash    Please use paper tape    Medications:                                                                                                                        I reviewed home medications   ROS:                                                                                                                                      Unable to obtain due to altered mental status   Examination:                                                                                                      General: Appears well-developed, slightly obese Psych: Affect appropriate  to situation Eyes: No scleral injection HENT: No OP obstrucion Head: Normocephalic.  Cardiovascular: Normal rate and regular rhythm.  Respiratory: Effort normal and breath sounds normal to anterior ascultation GI: Soft.  No distension. There is no tenderness.  Skin: WDI    Neurological Examination ( patient on BiPAP and limited ability to obtain history) Mental Status: Somnolent but arousable, stated her name, but not oriented to place, month. Followed commands intermittently.  Cranial Nerves: II: Visual fields: impaired left eye temporal fields III,IV, VI: ptosis not present, extra-ocular motions intact bilaterally, pupils are large,round, reactive to light and accommodation. Left pupil in 80mm, right pupil 6 mmm V,VII: smile symmetric, facial light touch sensation normal bilaterally VIII: hearing normal bilaterally IX,X: uvula rises symmetrically XI: bilateral shoulder shrug XII: midline tongue extension Motor: Right : Upper extremity   5/5    Left:     Upper extremity   1/5  Lower extremity   5/5     Lower extremity   1/5 Tone and bulk:normal tone throughout; no atrophy noted Sensory: unable to assess accurately Deep Tendon Reflexes: 2+ and symmetric throughout Plantars: Right: downgoing   Left: downgoing Cerebellar: No gross ataxia noted on right side, unable to test left side due to weakness Gait: not assessed to left side weakness    Lab Results: Basic Metabolic Panel: Recent Labs  Lab 07/16/18 Nov 05, 2213 07/16/18 2358  NA 135 135  134*  K 3.3* 3.6  3.6  CL 101 102  100  CO2  --  20*  21*  GLUCOSE 280* 192*  192*   BUN 19 18  18   CREATININE 0.80 0.95  0.92  CALCIUM  --  8.9  8.8*    CBC: Recent Labs  Lab 07/16/18 2215 07/16/18 2358  WBC  --  16.2*  NEUTROABS  --  14.9*  HGB 15.3* 12.6  HCT 45.0 40.7  MCV  --  90.4  PLT  --  286    Coagulation Studies: Recent Labs    07/16/18 05-Nov-2205  LABPROT 13.5  INR 1.04    Imaging: Dg Chest 1 View  Result Date: 07/16/2018 CLINICAL DATA:  Initial evaluation for acute altered mental status. EXAM: CHEST  1 VIEW COMPARISON:  Prior radiograph from 07/15/2017. FINDINGS: Patient is rotated to the right. There is increased opacity along the right paratracheal stripe, more prevalent as compared to previous exam. Tortuosity the intrathoracic aorta with associated aortic atherosclerosis. Mild cardiomegaly. Lungs are hypoinflated. Patchy opacity at the medial right lung base favored to reflect atelectasis, although infiltrate could be considered. Mild diffuse pulmonary interstitial congestion without frank alveolar edema. No effusion. No pneumothorax. No acute osseous abnormality. Right shoulder arthroplasty noted. IMPRESSION: 1. Patchy opacity at the medial right lung base, favored to reflect atelectasis, although infiltrate could be considered in the correct clinical setting. 2. Prominent opacity along the right paratracheal stripe. While this finding may be related to rotation and/or underlying normal vasculature, possible adenopathy or mass could also have this appearance. Further assessment with dedicated cross-sectional imaging suggested. 3. Cardiomegaly with mild diffuse pulmonary interstitial congestion without frank alveolar edema. Electronically Signed   By: Jeannine Boga M.D.   On: 07/16/2018 23:05   Ct Head Wo Contrast  Result Date: 07/16/2018 CLINICAL DATA:  84 y/o  F; found unresponsive. EXAM: CT HEAD WITHOUT CONTRAST TECHNIQUE: Contiguous axial images were obtained from the base of the skull through the vertex without intravenous contrast.  COMPARISON:  08/13/2017 CT head FINDINGS: Brain: No evidence of acute infarction,  hemorrhage, hydrocephalus, extra-axial collection or mass lesion/mass effect. Stable right parietal encephalomalacia. Stable chronic microvascular ischemic changes and volume loss of the brain. Vascular: Calcific atherosclerosis of carotid siphons. Skull: Chronic right parietal cranioplasty postsurgical changes. Stable small left frontal osteoma. Calvarium is otherwise unremarkable. Sinuses/Orbits: Stable chronic opacification of left mastoid tip. Additional visible paranasal sinuses and the mastoid air cells are normally aerated. Other: Bilateral intra-ocular lens replacement. IMPRESSION: 1. No acute intracranial abnormality identified. 2. Stable right parietal encephalomalacia and cranioplasty postsurgical changes. 3. Stable chronic microvascular ischemic changes and volume loss of the brain. Electronically Signed   By: Kristine Garbe M.D.   On: 07/16/2018 22:41     ASSESSMENT AND PLAN  84 year old female with past medical significant for meningioma status post craniotomy, subdural hemorrhage with resulting right parietal encephalomalacia, history of seizures, right parietal subdural hemorrhage presents to the emergency department after being found at home altered. She was also noted to be weak on the left side.  CT head obtained in the emergency room showed no acute findings and  redemonstrated right parietal encephalomalacia and craniotomy.  CTA was obtained and negative for large vessel occlusion.  Reassessing the patient, left sided weakness has improved since arrival raising suspicion for possible seizure with resultant Todd's paresis now gradually improving. MRI brain obtained also shows diffusion restriction in the mesial temporal lobe/hippocampus favoring that she likely has status epilepticus/seizure.   Acute metabolic Encephalopathy Suspected seizure/status epilepticus with post ictal Todd's paresis -  now resolving  Coma on presentation   Recommendations Start Keppra 500mg  BID  Seizure precautions Routine EEG tomorrow  Continue treat underlying sepsis  Punctate cerebellar infarction likely incidental  New onset A. fib with RVR  Recommendations  # MRI of the brain without contrast ( completed) #CTA Head and neck (completed)  #Transthoracic Echo  # Start patient on ASA 325mg  daily, will need anticoagulation for secondary prevention for stroke with CHADS2VASC score of 6 #Start Atorvastatin 0 mg/other high intensity statin # BP goal: normotension, avoid hypotension  # HBAIC and Lipid profile # Telemetry monitoring # Frequent neuro checks # stroke swallow screen  Please page stroke NP  Or  PA  Or MD from 8am -4 pm  as this patient from this time will be  followed by the stroke.   You can look them up on [email or web address]  Password [facility]    Sushanth Aroor [facility] Pager Number [phone]

## 2018-07-17 NOTE — Progress Notes (Signed)
Pharmacy Antibiotic Note  INFINITY JEFFORDS is a 84 y.o. female admitted on 07/16/2018 with AMS/sepsis.  Pharmacy has been consulted for Vancomycin and Aztreonam dosing. Vancomycin 1 g IV given in ED at 0200  Plan: Vancomycin 1250 mg IV q36h for estimated AUC of 451 Aztreonam 1 g IV q8h  Height: 5\' 5"  (165.1 cm) Weight: 180 lb (81.6 kg) IBW/kg (Calculated) : 57  Temp (24hrs), Avg:99.4 F (37.4 C), Min:99.4 F (37.4 C), Max:99.4 F (37.4 C)  Recent Labs  Lab 07/16/18 2214 07/16/18 2215 07/16/18 2358 07/17/18 0008  WBC  --   --  16.2*  --   CREATININE  --  0.80 0.95  0.92  --   LATICACIDVEN 3.25*  --   --  2.20*    Estimated Creatinine Clearance: 45.7 mL/min (by C-G formula based on SCr of 0.95 mg/dL).    Allergies  Allergen Reactions  . Amoxicillin Anaphylaxis  . Penicillins Anaphylaxis and Swelling    From childhood: Has patient had a PCN reaction causing immediate rash, facial/tongue/throat swelling, SOB or lightheadedness with hypotension: Yes Has patient had a PCN reaction causing severe rash involving mucus membranes or skin necrosis: Unk Has patient had a PCN reaction that required hospitalization: No Has patient had a PCN reaction occurring within the last 10 years: No If all of the above answers are "NO", then may proceed with Cephalosporin use. .  . Phenobarbital Other (See Comments)    Reaction not recalled by the patient  . Phenytoin Other (See Comments)    "Makes me feel funny"   . Tape Rash    Please use paper tape    Caryl Pina 07/17/2018 3:21 AM

## 2018-07-17 NOTE — Progress Notes (Signed)
The night team went to the bedside to assess [name] because they were told that she was endorsing SI during the day.  During the visit this evening, [name] was talkative and interactive and appeared to be significantly improved from her admission the previous night.  She did mention that she was experiencing a headache and some nausea she was hoping to be given medication other than Tylenol.  She was encouraged to try eating and drinking more were told that we would also consider giving NSAIDs once she began eating more.  Her sitter was at bedside and appeared to be interacting well with the patient without issue.  Overall the patient appeared to be in relatively good spirits and is interacting well with staff and did not bring up any complaints of SI/HI, although she did mention mention feelings of loneliness now that she now lives alone with her children are in [place].  Sitter and nursing staff were encouraged to call with the night team for any new issues that arise.

## 2018-07-17 NOTE — Progress Notes (Signed)
PT Cancellation Note  Patient Details Name: [name] MRN: [id number] DOB: [birth year]   Cancelled Treatment:    Reason Eval/Treat Not Completed: Medical issues which prohibited therapy Pt's trops trending up. Per department protocol, will hold PT for now. Will continue to follow acutely and await medically appropriateness.    [place] 07/17/2018, 8:09 AM

## 2018-07-18 ENCOUNTER — Inpatient Hospital Stay (HOSPITAL_COMMUNITY): Payer: Medicare Other

## 2018-07-18 ENCOUNTER — Other Ambulatory Visit (HOSPITAL_COMMUNITY): Payer: Self-pay

## 2018-07-18 DIAGNOSIS — I639 Cerebral infarction, unspecified: Secondary | ICD-10-CM

## 2018-07-18 DIAGNOSIS — I361 Nonrheumatic tricuspid (valve) insufficiency: Secondary | ICD-10-CM

## 2018-07-18 DIAGNOSIS — I340 Nonrheumatic mitral (valve) insufficiency: Secondary | ICD-10-CM

## 2018-07-18 DIAGNOSIS — R7989 Other specified abnormal findings of blood chemistry: Secondary | ICD-10-CM

## 2018-07-18 DIAGNOSIS — R778 Other specified abnormalities of plasma proteins: Secondary | ICD-10-CM

## 2018-07-18 LAB — ECHOCARDIOGRAM COMPLETE
Height: 65 in
Weight: 2903.02 [oz_av]

## 2018-07-18 LAB — TROPONIN I
Troponin I: 0.81 ng/mL (ref <0.03)
Troponin I: 0.67 ng/mL (ref <0.03)

## 2018-07-18 LAB — PROCALCITONIN: Procalcitonin: 2.39 ng/mL

## 2018-07-18 MED ORDER — FLUOXETINE HCL 20 MG PO CAPS
20.0000 mg | ORAL_CAPSULE | Freq: Every day | ORAL | Status: DC
  Administered 2018-07-18 – 2018-07-23 (×6): 20 mg via ORAL
  Filled 2018-07-18 (×7): qty 1

## 2018-07-18 MED ORDER — DOXYCYCLINE HYCLATE 100 MG PO TABS
100.0000 mg | ORAL_TABLET | Freq: Two times a day (BID) | ORAL | Status: AC
  Administered 2018-07-18 – 2018-07-23 (×10): 100 mg via ORAL
  Filled 2018-07-18 (×10): qty 1

## 2018-07-18 MED ORDER — METRONIDAZOLE IN NACL 5-0.79 MG/ML-% IV SOLN
500.0000 mg | Freq: Three times a day (TID) | INTRAVENOUS | Status: DC
  Administered 2018-07-18: 500 mg via INTRAVENOUS
  Filled 2018-07-18: qty 100

## 2018-07-18 NOTE — Progress Notes (Signed)
Increased agitation. Attempting to get out of bed. Doctor notified and came to bedside.

## 2018-07-18 NOTE — Progress Notes (Signed)
Family Medicine Teaching Service Daily Progress Note Intern Pager: [phone]  Patient name: [name] Medical record number: [id number] Date of birth: 1930/11/30 Age: 84 y.o. Gender: female  Primary Care Provider: [name], MD Consultants: neuro, cardiology Code Status: Full code  Pt Overview and Major Events to Date:  Hospital Day 1 Admitted: 07/16/2018   Assessment and Plan: [name] is a 84 y.o. female who presented with AMS and L side Upper and lower focal deficits, as well as sepsis and volume overload suggestive of new onset heart failure. Altered mental status and FND are now believed to be the result of a seizure. PMH is significant for encephalomalacia of the right parietal lobe status post craniotomy for meningioma, seizure d/o, CAD (mild plaque in 03/2012 cath), frequent falls and hypertension, subdural hematoma, HLD  AMS w/ L side deficit - patient was found down at home and brought to ED.  Initial labs showed wbc 16.2> 13.5, LA 3.25. CXR showed right lobar opacity. No evidence of hemorrhagic stroke on head CT. MRI brain showed increased diffusion abnormality in right temporal lobe suggesting seizure, inferior R cerebellum acute infarct, right parieto-occipital cranioplasty with underlying encephalomalacia. No evidence of large vessel occlusion on CTA. Neurology was consulted, and they suspect it may be todd's paralysis rather than stroke. Upon speaking with patient today her mental status is much improved and her focal neural deficits have resolved. CK normal, UDS normal - f/u EEG to be done today (12/7) - keppra 500mg  BID  - holding all neuroactive medication: gabapentin, prozac, adderall) - continue sepsis treatment: vanc, aztreonam, flagyl and f/u cultures.  - PT/OT - neuro checks - risk start labs: TSH, lipid panel, A1c: TSH normal, Lipid panel normal, A1c 5.0 - ECHO to be done today (12/7) - statin 40mg  daily  Sepsis - Resolved. Possible this was from  PNA as CXR suggests R middle lobe opacity. Most likely source at this time is pneumonia though patient not endorsing cough or other respiratory signs. Her WBCs, fever, and tachycardia could also be reactionary from possible seizure vs stroke and being found down for an unknown period of time. Blood and urine Cx pending, on broad spec (vanc, flagyl, aztreonam due to PCN allergy). Lactic acidosis resolved. We will continue to treat empirically while we await cultures and trend wbc.   - 67ml/kg NS bolus - f/u PCT, legionella, S. pneumo - continue abx - f/u cultures - trend LA  Volume overload - no Hx of heart failure. Contact cardiology regarding ECHO and new heart failure.  BNP - 593.3, trop 0.59> 0.81.  - cards consult  - f/u TTE   Acute hypercarbic resp failure - ABG has improved to 7.38, pco2 38.   - nasal cannula 4L. Wean as able.   HTN - HCTZ 12.5 at home.  Allowing permissive HTN currently - restart meds when appropriate.   HLD -  - lipid panel f/u - atorvostatin 40mg    Chronic pain - holding gabapentin   Bipolar/depression - endorses a long standing history of depression. States she is not bipolar. Take Prozac at home. - holding prozac in setting of seizure/stroke; consider restarting - consider giving Seroquel    Fluids: none . aztreonam 1 g (07/18/18 0228)  . levETIRAcetam Stopped (07/18/18 4174)  . metronidazole 500 mg (07/18/18 0529)  . vancomycin Stopped (07/18/18 0457)   Electrolytes: replete PRN  Nutrition: reg GI ppx: none DVT px: none  Disposition: SNF   Medications: Scheduled Meds: . atorvastatin  40 mg Oral q1800   Continuous Infusions: . aztreonam 1 g (07/18/18 0228)  . levETIRAcetam Stopped (07/18/18 4580)  . metronidazole 500 mg (07/18/18 0529)  . vancomycin Stopped (07/18/18 0457)   PRN Meds: acetaminophen **OR** acetaminophen (TYLENOL) oral liquid 160 mg/5 mL **OR**  acetaminophen  ================================================= ================================================= Subjective:  Patient was first appropriate this morning as she alert and oriented x 3.   Objective: Temp:  [98.1 F (36.7 C)-98.7 F (37.1 C)] 98.3 F (36.8 C) (12/07 0804) Pulse Rate:  [67-90] 67 (12/07 0804) Resp:  [18-20] 18 (12/07 0804) BP: (132-149)/(60-77) 132/72 (12/07 0804) SpO2:  [96 %-99 %] 96 % (12/07 0804) Intake/Output 12/06 0701 - 12/07 0700 In: 1222.4 [P.O.:400; IV Piggyback:822.4] Out: 1500 [Urine:1500] Physical Exam:  Gen: sitting up in bed, clearly agitated but NAD HEENT: Normocephaic, atraumatic.   CV: RRR, 2/6 systolic murmur, no rubs or gallops Resp: CTAB, no wheezing, crackles, or rhonchi Abd: non-distended, non-tender, soft, +bs Neuro:  A&O x 3 however did not remember speaking to me this am. Psych: patient agitated Extremities: +2 distal pulses, no cyanosis or edema  Laboratory: Recent Labs  Lab 07/16/18 2215 07/16/18 2358 07/17/18 0615  WBC  --  16.2* 13.5*  HGB 15.3* 12.6 12.1  HCT 45.0 40.7 38.2  PLT  --  286 284   Recent Labs  Lab 07/16/18 2207 07/16/18 2215 07/16/18 2358 07/17/18 0615  NA  --  135 135  134* 138  K  --  3.3* 3.6  3.6 3.2*  CL  --  101 102  100 103  CO2  --   --  20*  21* 24  BUN  --  19 18  18 13   CREATININE  --  0.80 0.95  0.92 0.70  CALCIUM  --   --  8.9  8.8* 8.1*  PROT 7.7  --  7.0  --   BILITOT 0.6  --  0.3  --   ALKPHOS 115  --  104  --   ALT 19  --  18  --   AST 36  --  32  --   GLUCOSE  --  280* 192*  192* 155*   Imaging/Diagnostic Tests: Ct Angio Head W Or Wo Contrast  Result Date: 07/17/2018 CLINICAL DATA:  Initial evaluation for acute altered mental status, found down. EXAM: CT ANGIOGRAPHY HEAD AND NECK TECHNIQUE: Multidetector CT imaging of the head and neck was performed using the standard protocol during bolus administration of intravenous contrast. Multiplanar CT image  reconstructions and MIPs were obtained to evaluate the vascular anatomy. Carotid stenosis measurements (when applicable) are obtained utilizing NASCET criteria, using the distal internal carotid diameter as the denominator. CONTRAST:  78mL ISOVUE-370 IOPAMIDOL (ISOVUE-370) INJECTION 76% COMPARISON:  Prior CT from 07/16/2018 FINDINGS: CTA NECK FINDINGS Aortic arch: Visualized aortic arch of normal caliber with normal branch pattern. Moderate to advanced atheromatous plaque about the arch and origin of the great vessels without high-grade stenosis. Great vessels tortuous proximally. Visualized subclavian arteries patent without stenosis. Right carotid system: Right common carotid artery tortuous but widely patent to the bifurcation without stenosis. Calcified plaque about the right bifurcation without hemodynamically significant stenosis. Right ICA medialized into the retropharyngeal space and widely patent to the skull base without stenosis, dissection, or occlusion. Left carotid system: Left common carotid artery tortuous proximally but widely patent to the bifurcation. Concentric plaque about the left bifurcation without hemodynamically significant stenosis. Left ICA medialized into the retropharyngeal space but widely patent to the skull  base without stenosis, dissection, or occlusion. Vertebral arteries: Both of the vertebral arteries arise from the subclavian arteries. Relatively mild stenosis at the origin of the right vertebral artery. Irregularity of the pre foraminal right V1 segment could be related atherosclerotic change versus changes related to mild FMD. Vertebral arteries tortuous but patent to the vertebrobasilar junction without stenosis, dissection, or occlusion. Skeleton: No acute osseous abnormality. No discrete lytic or blastic osseous lesions. Moderate cervical spondylolysis at C4-5 through C6-7. Other neck: No acute soft tissue abnormality within the neck. Salivary glands within normal limits.  No adenopathy. Thyroid within normal limits. Upper chest: Scattered ground-glass opacity with interlobular septal thickening within the visualized upper lungs, consistent with pulmonary interstitial edema. Partially visualized upper chest demonstrates no other acute finding. Review of the MIP images confirms the above findings CTA HEAD FINDINGS Anterior circulation: Petrous segments patent bilaterally. Mild scattered atheromatous plaque within the cavernous/supraclinoid ICAs without hemodynamically significant stenosis. 3 mm focal outpouching arising from the cavernous right ICA compatible with a small aneurysm (series 8, image 92). There is an additional fairly wide necked aneurysm arising from the cavernous left ICA measuring approximately 6 x 5 x 6 mm (series 8, image 98). This is directed laterally and slightly posteriorly. ICA termini patent bilaterally, tortuous on the left. A1 segments patent bilaterally. Normal anterior communicating artery complex. Anterior cerebral arteries patent to their distal aspects without flow-limiting stenosis. No M1 occlusion or stenosis. Normal MCA bifurcations. Distal MCA branches well perfused and fairly symmetric. Posterior circulation: Bilateral V4 segments demonstrate scattered multifocal atheromatous irregularity without high-grade stenosis. Posterior inferior cerebral arteries patent bilaterally. Basilar artery somewhat diminutive but widely patent to its distal aspect. Superior cerebral arteries patent bilaterally. Fetal type origin of the left PCA supplied via a widely patent left posterior communicating artery. Hypoplastic right P1 with prominent right posterior communicating artery. PCAs patent to their distal aspects without occlusion or high-grade stenosis. Venous sinuses: Grossly patent. Anatomic variants: Predominant fetal type origin of the PCAs with somewhat diminutive vertebrobasilar system. Delayed phase: No abnormal enhancement. Right parieto-occipital  cranioplasty with underlying encephalomalacia noted. Review of the MIP images confirms the above findings IMPRESSION: 1. Negative CTA for large vessel occlusion. No hemodynamically significant or correctable stenosis identified. 2. Bilateral cavernous ICA aneurysms as above, measuring up to 3 mm on the right and 6 mm on the left. 3. Diffuse tortuosity of the major arterial vasculature of the head and neck, suggesting underlying chronic hypertension. 4. Scattered ground-glass opacity with interlobular septal thickening within the visualized lungs, compatible with pulmonary interstitial edema. Correlation with plain film radiography suggested. Electronically Signed   By: Jeannine Boga M.D.   On: 07/17/2018 05:27   Dg Chest 1 View  Result Date: 07/16/2018 CLINICAL DATA:  Initial evaluation for acute altered mental status. EXAM: CHEST  1 VIEW COMPARISON:  Prior radiograph from 07/15/2017. FINDINGS: Patient is rotated to the right. There is increased opacity along the right paratracheal stripe, more prevalent as compared to previous exam. Tortuosity the intrathoracic aorta with associated aortic atherosclerosis. Mild cardiomegaly. Lungs are hypoinflated. Patchy opacity at the medial right lung base favored to reflect atelectasis, although infiltrate could be considered. Mild diffuse pulmonary interstitial congestion without frank alveolar edema. No effusion. No pneumothorax. No acute osseous abnormality. Right shoulder arthroplasty noted. IMPRESSION: 1. Patchy opacity at the medial right lung base, favored to reflect atelectasis, although infiltrate could be considered in the correct clinical setting. 2. Prominent opacity along the right paratracheal stripe. While this finding may be  related to rotation and/or underlying normal vasculature, possible adenopathy or mass could also have this appearance. Further assessment with dedicated cross-sectional imaging suggested. 3. Cardiomegaly with mild diffuse  pulmonary interstitial congestion without frank alveolar edema. Electronically Signed   By: Jeannine Boga M.D.   On: 07/16/2018 23:05   Ct Head Wo Contrast  Result Date: 07/16/2018 CLINICAL DATA:  84 y/o  F; found unresponsive. EXAM: CT HEAD WITHOUT CONTRAST TECHNIQUE: Contiguous axial images were obtained from the base of the skull through the vertex without intravenous contrast. COMPARISON:  08/13/2017 CT head FINDINGS: Brain: No evidence of acute infarction, hemorrhage, hydrocephalus, extra-axial collection or mass lesion/mass effect. Stable right parietal encephalomalacia. Stable chronic microvascular ischemic changes and volume loss of the brain. Vascular: Calcific atherosclerosis of carotid siphons. Skull: Chronic right parietal cranioplasty postsurgical changes. Stable small left frontal osteoma. Calvarium is otherwise unremarkable. Sinuses/Orbits: Stable chronic opacification of left mastoid tip. Additional visible paranasal sinuses and the mastoid air cells are normally aerated. Other: Bilateral intra-ocular lens replacement. IMPRESSION: 1. No acute intracranial abnormality identified. 2. Stable right parietal encephalomalacia and cranioplasty postsurgical changes. 3. Stable chronic microvascular ischemic changes and volume loss of the brain. Electronically Signed   By: Kristine Garbe M.D.   On: 07/16/2018 22:41   Ct Angio Neck W Or Wo Contrast  Result Date: 07/17/2018 CLINICAL DATA:  Initial evaluation for acute altered mental status, found down. EXAM: CT ANGIOGRAPHY HEAD AND NECK TECHNIQUE: Multidetector CT imaging of the head and neck was performed using the standard protocol during bolus administration of intravenous contrast. Multiplanar CT image reconstructions and MIPs were obtained to evaluate the vascular anatomy. Carotid stenosis measurements (when applicable) are obtained utilizing NASCET criteria, using the distal internal carotid diameter as the denominator. CONTRAST:   58mL ISOVUE-370 IOPAMIDOL (ISOVUE-370) INJECTION 76% COMPARISON:  Prior CT from 07/16/2018 FINDINGS: CTA NECK FINDINGS Aortic arch: Visualized aortic arch of normal caliber with normal branch pattern. Moderate to advanced atheromatous plaque about the arch and origin of the great vessels without high-grade stenosis. Great vessels tortuous proximally. Visualized subclavian arteries patent without stenosis. Right carotid system: Right common carotid artery tortuous but widely patent to the bifurcation without stenosis. Calcified plaque about the right bifurcation without hemodynamically significant stenosis. Right ICA medialized into the retropharyngeal space and widely patent to the skull base without stenosis, dissection, or occlusion. Left carotid system: Left common carotid artery tortuous proximally but widely patent to the bifurcation. Concentric plaque about the left bifurcation without hemodynamically significant stenosis. Left ICA medialized into the retropharyngeal space but widely patent to the skull base without stenosis, dissection, or occlusion. Vertebral arteries: Both of the vertebral arteries arise from the subclavian arteries. Relatively mild stenosis at the origin of the right vertebral artery. Irregularity of the pre foraminal right V1 segment could be related atherosclerotic change versus changes related to mild FMD. Vertebral arteries tortuous but patent to the vertebrobasilar junction without stenosis, dissection, or occlusion. Skeleton: No acute osseous abnormality. No discrete lytic or blastic osseous lesions. Moderate cervical spondylolysis at C4-5 through C6-7. Other neck: No acute soft tissue abnormality within the neck. Salivary glands within normal limits. No adenopathy. Thyroid within normal limits. Upper chest: Scattered ground-glass opacity with interlobular septal thickening within the visualized upper lungs, consistent with pulmonary interstitial edema. Partially visualized upper  chest demonstrates no other acute finding. Review of the MIP images confirms the above findings CTA HEAD FINDINGS Anterior circulation: Petrous segments patent bilaterally. Mild scattered atheromatous plaque within the cavernous/supraclinoid ICAs without hemodynamically  significant stenosis. 3 mm focal outpouching arising from the cavernous right ICA compatible with a small aneurysm (series 8, image 92). There is an additional fairly wide necked aneurysm arising from the cavernous left ICA measuring approximately 6 x 5 x 6 mm (series 8, image 98). This is directed laterally and slightly posteriorly. ICA termini patent bilaterally, tortuous on the left. A1 segments patent bilaterally. Normal anterior communicating artery complex. Anterior cerebral arteries patent to their distal aspects without flow-limiting stenosis. No M1 occlusion or stenosis. Normal MCA bifurcations. Distal MCA branches well perfused and fairly symmetric. Posterior circulation: Bilateral V4 segments demonstrate scattered multifocal atheromatous irregularity without high-grade stenosis. Posterior inferior cerebral arteries patent bilaterally. Basilar artery somewhat diminutive but widely patent to its distal aspect. Superior cerebral arteries patent bilaterally. Fetal type origin of the left PCA supplied via a widely patent left posterior communicating artery. Hypoplastic right P1 with prominent right posterior communicating artery. PCAs patent to their distal aspects without occlusion or high-grade stenosis. Venous sinuses: Grossly patent. Anatomic variants: Predominant fetal type origin of the PCAs with somewhat diminutive vertebrobasilar system. Delayed phase: No abnormal enhancement. Right parieto-occipital cranioplasty with underlying encephalomalacia noted. Review of the MIP images confirms the above findings IMPRESSION: 1. Negative CTA for large vessel occlusion. No hemodynamically significant or correctable stenosis identified. 2.  Bilateral cavernous ICA aneurysms as above, measuring up to 3 mm on the right and 6 mm on the left. 3. Diffuse tortuosity of the major arterial vasculature of the head and neck, suggesting underlying chronic hypertension. 4. Scattered ground-glass opacity with interlobular septal thickening within the visualized lungs, compatible with pulmonary interstitial edema. Correlation with plain film radiography suggested. Electronically Signed   By: Jeannine Boga M.D.   On: 07/17/2018 05:27   Mr Brain Wo Contrast  Result Date: 07/17/2018 CLINICAL DATA:  Initial evaluation for acute altered mental status, unresponsive. History of seizure. EXAM: MRI HEAD WITHOUT CONTRAST TECHNIQUE: Multiplanar, multiecho pulse sequences of the brain and surrounding structures were obtained without intravenous contrast. COMPARISON:  Prior CTA from earlier same day as well as previous examinations. FINDINGS: Brain: Diffuse prominence of the CSF containing spaces compatible with generalized cerebral atrophy. Sequelae of prior right parieto-occipital craniectomy with cranioplasty. Underlying encephalomalacia within the right parietal region. Increased diffusion signal abnormality seen within the mesial right temporal lobe involving the right hippocampus (series 5, image 50). Associated mildly increased FLAIR signal abnormality. Findings favored to reflect sequelae of acute seizure, although ischemia not entirely excluded. No significant mass effect. Incidental note made of a punctate 4 mm focus of mild diffusion abnormality within the inferior right cerebellum (series 5, image 40), suggestive of a small concomitant acute to early subacute ischemic infarct. No associated mass effect or hemorrhage. No other evidence for acute or subacute ischemia. Gray-white matter differentiation otherwise maintained. No evidence for acute or chronic intracranial hemorrhage on this motion degraded exam. No mass lesion, midline shift or mass effect. No  hydrocephalus. No extra-axial fluid collection. Pituitary gland within normal limits. Vascular: Major intracranial vascular flow voids maintained. Skull and upper cervical spine: Craniocervical junction normal. Prominent degenerative changes noted about the C1-2 articulation. No acute scalp soft tissue abnormality. Prior right parieto-occipital cranioplasty. Sinuses/Orbits: Globes and orbital soft tissues demonstrate no acute finding. Patient status post bilateral ocular lens replacement. Mild scattered mucosal thickening within the ethmoidal air cells. Paranasal sinuses are otherwise clear. Left mastoid effusion noted, of doubtful significance. Other: None. IMPRESSION: 1. Increased diffusion abnormality involving the mesial right temporal lobe/hippocampus. Given patient history,  findings favored to reflect the sequelae of acute seizure/status epilepticus. Correlation with EEG recommended. Differential considerations include sequelae of acute ischemia or herpes encephalitis. Correlation with CSF analysis recommended as warranted. 2. Punctate 4 mm focus of diffusion abnormality within the inferior right cerebellum, suggestive of a small concomitant acute to early subacute ischemic nonhemorrhagic infarct. 3. Prior right parieto-occipital cranioplasty with underlying encephalomalacia. Electronically Signed   By: Jeannine Boga M.D.   On: 07/17/2018 05:55      Nuala Alpha, DO 07/18/2018, 8:22 AM PGY-2, [place] Intern pager: [phone], text pages welcome

## 2018-07-18 NOTE — Progress Notes (Signed)
RT NOTE:  Spoke with patient RN about Bipap.  RN stated that patient is now on 3L  and no longer needs Bipap machine.

## 2018-07-18 NOTE — Progress Notes (Signed)
PT Cancellation Note  Patient Details Name: [name] MRN: [id number] DOB: 23-Feb-1931   Cancelled Treatment:    Reason Eval/Treat Not Completed: Medical issues which prohibited therapy, pt with troponin trending up yesterday and no new value today. Spoke with RN, Gabriel Earing, who agreed hold therapy until further labs drawn or cleared by cards.   Leighton Roach, [place]  Pager [phone] Office [place] 07/18/2018, 10:35 AM

## 2018-07-18 NOTE — Progress Notes (Signed)
OT Cancellation Note  Patient Details Name: DHANYA BOGLE MRN: [id number] DOB: 09/10/1930   Cancelled Treatment:    Reason Eval/Treat Not Completed: Medical issues which prohibited therapy. Pt with troponin trending up yesterday and no new value today. Spoke with RN, Gabriel Earing, who agreed hold therapy until further labs drawn or cleared by cardiology. Will return as schedule allows.  [place], OTR/L Acute Rehab Pager: [phone] Office: [phone] 07/18/2018, 10:39 AM

## 2018-07-18 NOTE — Progress Notes (Signed)
Neurology Progress Note   S:// Seen and examined.  Much more awake and cooperative with exam today.   O:// Current vital signs: BP 132/72 (BP Location: Right Arm)   Pulse 67   Temp 98.3 F (36.8 C) (Oral)   Resp 18   Ht 5\' 5"  (1.651 m)   Wt 82.3 kg   SpO2 96%   BMI 30.19 kg/m  Vital signs in last 24 hours: Temp:  [98.1 F (36.7 C)-98.7 F (37.1 C)] 98.3 F (36.8 C) (12/07 0804) Pulse Rate:  [67-90] 67 (12/07 0804) Resp:  [18-20] 18 (12/07 0804) BP: (132-149)/(60-77) 132/72 (12/07 0804) SpO2:  [96 %-99 %] 96 % (12/07 0804) General: Patient is awake alert, oriented to self place and time. H ENT: Normocephalic atraumatic dry mucous membranes no lymphadenopathy no thyromegaly Lungs are clear to auscultation bilaterally with no wheezing Cardiovascular: S1-S2 heard regular rate rhythm Abdomen soft nondistended nontender Neurological exam Patient is awake, alert, oriented to place person and time. 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  Her first child was in [place].  She lives in [place] because her ex-husband build a house here and she kept that house. Her speech is mildly dysarthric but she tells me that this is probably her normal speech and prosody. Naming, comprehension and repetition intact. Attention and concentration is still deranged. Cranial nerves: Pupils are equal round reactive light, extraocular movements are intact, visual fields are full, face is symmetric, tongue is midline. Motor exam: Is antigravity in both upper extremities and so in lower extremities bilaterally. Sensory exam: Intact to light touch in all 4 extremities Coordination: Difficult to redirect her but no obvious dysmetria. Gait testing was deferred at this time due to her encephalopathy.  Medications  Current Facility-Administered Medications:  .  acetaminophen (TYLENOL) tablet 650 mg, 650 mg, Oral, Q4H PRN **OR** acetaminophen (TYLENOL) solution 650 mg, 650 mg, Per Tube, Q4H PRN **OR**  acetaminophen (TYLENOL) suppository 650 mg, 650 mg, Rectal, Q4H PRN, Diallo, Abdoulaye, MD .  atorvastatin (LIPITOR) tablet 40 mg, 40 mg, Oral, q1800, Everrett Coombe, MD, 40 mg at 07/17/18 1833 .  aztreonam (AZACTAM) 1 g in sodium chloride 0.9 % 100 mL IVPB, 1 g, Intravenous, Q8H, Chambliss, Marshall L, MD, Last Rate: 200 mL/hr at 07/18/18 0228, 1 g at 07/18/18 0228 .  levETIRAcetam (KEPPRA) IVPB 500 mg/100 mL premix, 500 mg, Intravenous, BID, Aroor, Lanice Schwab, MD, Stopped at 07/18/18 6734 .  metroNIDAZOLE (FLAGYL) IVPB 500 mg, 500 mg, Intravenous, Q8H, Diallo, Abdoulaye, MD, Last Rate: 100 mL/hr at 07/18/18 0529, 500 mg at 07/18/18 0529 .  vancomycin (VANCOCIN) 1,250 mg in sodium chloride 0.9 % 250 mL IVPB, 1,250 mg, Intravenous, Q36H, Lind Covert, MD, Stopped at 07/18/18 0457 Labs CBC    Component Value Date/Time   WBC 13.5 (H) 07/17/2018 0615   RBC 4.29 07/17/2018 0615   HGB 12.1 07/17/2018 0615   HGB 12.9 12/25/2017 1241   HCT 38.2 07/17/2018 0615   HCT 39.8 12/25/2017 1241   PLT 284 07/17/2018 0615   PLT 372 12/25/2017 1241   MCV 89.0 07/17/2018 0615   MCV 86 12/25/2017 1241   MCH 28.2 07/17/2018 0615   MCHC 31.7 07/17/2018 0615   RDW 13.8 07/17/2018 0615   RDW 14.5 12/25/2017 1241   LYMPHSABS 0.8 07/17/2018 0615   MONOABS 0.8 07/17/2018 0615   EOSABS 0.0 07/17/2018 0615   BASOSABS 0.0 07/17/2018 0615    CMP     Component Value Date/Time   NA 138 07/17/2018  0615   NA 140 12/25/2017 1241   K 3.2 (L) 07/17/2018 0615   CL 103 07/17/2018 0615   CO2 24 07/17/2018 0615   GLUCOSE 155 (H) 07/17/2018 0615   BUN 13 07/17/2018 0615   BUN 17 12/25/2017 1241   CREATININE 0.70 07/17/2018 0615   CREATININE 0.71 03/16/2015 0858   CALCIUM 8.1 (L) 07/17/2018 0615   PROT 7.0 07/16/2018 2358   PROT 6.9 12/25/2017 1241   ALBUMIN 3.4 (L) 07/16/2018 2358   ALBUMIN 3.9 12/25/2017 1241   AST 32 07/16/2018 2358   ALT 18 07/16/2018 2358   ALKPHOS 104 07/16/2018 2358   BILITOT  0.3 07/16/2018 2358   BILITOT 0.3 12/25/2017 1241   GFRNONAA >60 07/17/2018 0615   GFRNONAA 80 10/02/2011 1433   GFRAA >60 07/17/2018 0615   GFRAA >89 10/02/2011 1433    Stroke labs LDL 70 A1c 5.0  Lipid Panel     Component Value Date/Time   CHOL 122 07/17/2018 0615   CHOL 174 12/25/2017 1241   TRIG 42 07/17/2018 0615   HDL 44 07/17/2018 0615   HDL 45 12/25/2017 1241   CHOLHDL 2.8 07/17/2018 0615   VLDL 8 07/17/2018 0615   LDLCALC 70 07/17/2018 0615   LDLCALC 107 (H) 12/25/2017 1241     Imaging I have reviewed images in epic and the results pertinent to this consultation are: MRI of the brain shows diffusion abnormality involving the right mesial temporal lobe and hippocampus-likely sequela of seizure and status epilepticus. Punctate focus of stroke in the right cerebellum suggestive of acute or subacute infarct. Prior right parieto-occipital cranioplasty with underlying cephalo-malacia. CTA head and neck with no LVO.  Assessment:  84 year old woman with a past medical history of meningioma status post craniotomy on the right side, subdural hemorrhage with resultant right parietal encephalomalacia, history of seizures, depression and suicidal ideation in the past brought into the emergency room after being found down and altered from home.  Noted to be weak on the left side and though left-sided weakness is improving.  Today she has nearly symmetric strength on the left side compared to the right. Most likely seizure, status epilepticus followed by Todd's paralysis. Imaging findings with Ms. of temporal lobe hyperintensity on DWI also likely indicative of seizures and less likely HSV with rapidly improving mentation.  Impression: Status epilepticus resolved History of seizure disorder with new seizure. Toxic metabolic encephalopathy New diagnosis of atrial fibrillation with RVR Punctate embolic stroke in the cerebellum-likely incidental finding but related to underlying A.  fib Possible sepsis-being managed by primary team Troponinemia-being managed by primary team  Recommendations: From a seizure perspective: -Keppra 500 twice daily -Seizure precautions - Routine EEG at some point during hospitalization. - Do not think she needs a spinal tap at this point.  From a stroke perspective: -Vascular imaging unremarkable.  New A. fib.  Will need anticoagulation with neuro anticoagulant in the longer term- Eliquis can be started when okay with the primary team. -Atorvastatin 80 -Physical therapy, speech therapy, occupational therapy. -Management of A. fib with RVR per primary team.  Management of troponinemia per primary team as you are.   Neurology service will sign off at this time.  Please call with questions or if there is any abnormality noted on the EEG.   -- Amie Portland, MD [facility] Pager: [phone] If 7pm to 7am, please call on call as listed on AMION.

## 2018-07-18 NOTE — Progress Notes (Signed)
Progress Note   Subjective   Doing well today, the patient denies CP or SOB.    Inpatient Medications    Scheduled Meds: . atorvastatin  40 mg Oral q1800   Continuous Infusions: . aztreonam 1 g (07/18/18 0931)  . levETIRAcetam 500 mg (07/18/18 0944)  . metronidazole    . vancomycin Stopped (07/18/18 0457)   PRN Meds: acetaminophen **OR** acetaminophen (TYLENOL) oral liquid 160 mg/5 mL **OR** acetaminophen   Vital Signs    Vitals:   07/18/18 0339 07/18/18 0600 07/18/18 0804 07/18/18 1141  BP: (!) 149/65  132/72 (!) 148/73  Pulse: 76  67 71  Resp: 18  18 18   Temp: 98.2 F (36.8 C)  98.3 F (36.8 C) 98.3 F (36.8 C)  TempSrc: Oral  Oral Oral  SpO2:  97% 96% 98%  Weight:      Height:        Intake/Output Summary (Last 24 hours) at 07/18/2018 1251 Last data filed at 07/18/2018 0500 Gross per 24 hour  Intake 1222.41 ml  Output 300 ml  Net 922.41 ml   Filed Weights   07/16/18 2206 07/17/18 0322  Weight: 81.6 kg 82.3 kg    Physical Exam   GEN- The patient is elderly and frail appearing, alert and oriented x 3 today.   Head- normocephalic, atraumatic Eyes-  Sclera clear, conjunctiva pink Ears- hearing intact Oropharynx- clear Neck- supple, Lungs- Clear to ausculation bilaterally, normal work of breathing Heart- Regular rate and rhythm , 2/6 SEM GI- soft, NT, ND, + BS Extremities- no clubbing, cyanosis, + dependant edema  MS- diffuse atrophy Skin- no rash or lesion Psych- euthymic mood, full affect Neuro- strength and sensation are intact   Labs    Chemistry Recent Labs  Lab 07/16/18 2207 07/16/18 2215 07/16/18 2358 07/17/18 0615  NA  --  135 135  134* 138  K  --  3.3* 3.6  3.6 3.2*  CL  --  101 102  100 103  CO2  --   --  20*  21* 24  GLUCOSE  --  280* 192*  192* 155*  BUN  --  19 18  18 13   CREATININE  --  0.80 0.95  0.92 0.70  CALCIUM  --   --  8.9  8.8* 8.1*  PROT 7.7  --  7.0  --   ALBUMIN 3.7  --  3.4*  --   AST 36  --  32   --   ALT 19  --  18  --   ALKPHOS 115  --  104  --   BILITOT 0.6  --  0.3  --   GFRNONAA  --   --  55*  57* >60  GFRAA  --   --  >60  >60 >60  ANIONGAP  --   --  13  13 11      Hematology Recent Labs  Lab 07/16/18 2215 07/16/18 2358 07/17/18 0615  WBC  --  16.2* 13.5*  RBC  --  4.50 4.29  HGB 15.3* 12.6 12.1  HCT 45.0 40.7 38.2  MCV  --  90.4 89.0  MCH  --  28.0 28.2  MCHC  --  31.0 31.7  RDW  --  13.6 13.8  PLT  --  286 284    Cardiac Enzymes Recent Labs  Lab 07/17/18 0615 07/17/18 1049 07/17/18 1521 07/17/18 2118  TROPONINI 0.81* 0.89* 0.87* 1.16*    Recent Labs  Lab 07/16/18 2212  TROPIPOC  0.42*       Patient Profile:   [name] is a 84 y.o. female with a hx of meningioma status post craniotomy on the right side, subdural hemorrhage with resulting right parietal encephalomalacia, seizure disorders, HTN, HLD, depression, falls and prior suicidal Ideation  who is being seen today for the evaluation of Elevated troponin at the request of Dr. Erin Hearing.    Assessment & Plan    1.  Elevated troponin Appears to be noncardiac Awaiting echo results Given advanced age and comorbidities, I would advise conservative cardiac management  2. Murmur Echo pending  3. Stroke Per neurology  Thompson Grayer MD, [facility] 07/18/2018 12:51 PM

## 2018-07-18 NOTE — Progress Notes (Signed)
  Echocardiogram 2D Echocardiogram has been performed.  [name] 07/18/2018, 3:49 PM

## 2018-07-19 LAB — BASIC METABOLIC PANEL
Anion gap: 10 (ref 5–15)
Anion gap: 10 (ref 5–15)
BUN: 8 mg/dL (ref 8–23)
BUN: 8 mg/dL (ref 8–23)
CALCIUM: 8.4 mg/dL — AB (ref 8.9–10.3)
CO2: 25 mmol/L (ref 22–32)
CO2: 25 mmol/L (ref 22–32)
Calcium: 8.5 mg/dL — ABNORMAL LOW (ref 8.9–10.3)
Chloride: 102 mmol/L (ref 98–111)
Chloride: 102 mmol/L (ref 98–111)
Creatinine, Ser: 0.48 mg/dL (ref 0.44–1.00)
Creatinine, Ser: 0.57 mg/dL (ref 0.44–1.00)
GFR calc Af Amer: >60 mL/min (ref >60)
GFR calc Af Amer: >60 mL/min (ref >60)
GFR calc non Af Amer: >60 mL/min (ref >60)
GFR calc non Af Amer: >60 mL/min (ref >60)
Glucose, Bld: 105 mg/dL — ABNORMAL HIGH (ref 70–99)
Glucose, Bld: 96 mg/dL (ref 70–99)
Potassium: 2.6 mmol/L — CL (ref 3.5–5.1)
Potassium: 3.2 mmol/L — ABNORMAL LOW (ref 3.5–5.1)
Sodium: 137 mmol/L (ref 135–145)
Sodium: 137 mmol/L (ref 135–145)

## 2018-07-19 LAB — TROPONIN I: TROPONIN I: 0.61 ng/mL — AB (ref <0.03)

## 2018-07-19 LAB — PROCALCITONIN: Procalcitonin: 1.19 ng/mL

## 2018-07-19 LAB — LEGIONELLA PNEUMOPHILA SEROGP 1 UR AG: L. pneumophila Serogp 1 Ur Ag: NEGATIVE

## 2018-07-19 LAB — MAGNESIUM: Magnesium: 1.9 mg/dL (ref 1.7–2.4)

## 2018-07-19 MED ORDER — POTASSIUM CHLORIDE CRYS ER 20 MEQ PO TBCR
40.0000 meq | EXTENDED_RELEASE_TABLET | Freq: Two times a day (BID) | ORAL | Status: AC
  Administered 2018-07-19 – 2018-07-20 (×2): 40 meq via ORAL
  Filled 2018-07-19 (×2): qty 2

## 2018-07-19 MED ORDER — IBUPROFEN 200 MG PO TABS
400.0000 mg | ORAL_TABLET | Freq: Once | ORAL | Status: AC
  Administered 2018-07-19: 400 mg via ORAL
  Filled 2018-07-19: qty 2

## 2018-07-19 MED ORDER — POTASSIUM CHLORIDE CRYS ER 20 MEQ PO TBCR
40.0000 meq | EXTENDED_RELEASE_TABLET | Freq: Once | ORAL | Status: AC
  Administered 2018-07-19: 40 meq via ORAL
  Filled 2018-07-19: qty 2

## 2018-07-19 MED ORDER — POTASSIUM CHLORIDE CRYS ER 20 MEQ PO TBCR
40.0000 meq | EXTENDED_RELEASE_TABLET | ORAL | Status: AC
  Administered 2018-07-19 (×2): 40 meq via ORAL
  Filled 2018-07-19 (×2): qty 2

## 2018-07-19 MED ORDER — HYDRALAZINE HCL 20 MG/ML IJ SOLN: 5.0000 mg | Freq: Four times a day (QID) | INTRAMUSCULAR | Status: DC | PRN

## 2018-07-19 MED ORDER — AMLODIPINE BESYLATE 5 MG PO TABS
5.0000 mg | ORAL_TABLET | Freq: Every day | ORAL | Status: DC
  Administered 2018-07-19 – 2018-07-21 (×3): 5 mg via ORAL
  Filled 2018-07-19 (×3): qty 1

## 2018-07-19 NOTE — Progress Notes (Signed)
CRITICAL VALUE ALERT  Critical Value: Potasium = 2.6  Date & Time Notied:  07/19/2018;  0121 Provider Notified: Aram Candela. MD  Orders Received/Actions taken: See new orders

## 2018-07-19 NOTE — Discharge Summary (Signed)
[facility] Discharge Summary  Patient name: [name] Medical record number: [id number] Date of birth: 01/03/1931 Age: 84 y.o. Gender: female Date of Admission: 07/16/2018  Date of Discharge: 07/23/2018 Admitting Physician: Marshall L Chambliss, MD  Primary Care Provider: Hensel, William A, MD Consultants: neurology, cardiology  Indication for Hospitalization: seizure with unilateral weakness, sepsis.   Discharge Diagnoses/Problem List:  Seizure Todd's paralysis pneumonia Sepsis Depression Elevated troponin HTN HLD  Disposition: home  Discharge Condition: improved, stable   Discharge Exam:  BP (!) 148/75 (BP Location: Left Arm)   Pulse 78   Temp 99.2 F (37.3 C) (Oral)   Resp 20   Ht 5' 5" (1.651 m)   Wt 82.3 kg   SpO2 95%   BMI 30.19 kg/m  Gen: NAD, alert, non-toxic, sitting comfortably    CV: tender to palpation over the left scapula.  Regular rate and rhythm.  No arrythmia detected.  Normal capillary refill bilaterally.  Radial pulses 2+ bilaterally. No bilateral lower extremity edema. Resp: lungs clear to auscultation bilaterally. No increased work of breathing appreciated. Abd: Nontender and nondistended on palpation to all 4 quadrants.  Positive bowel sounds. Psych: patient affect improved today.  Extremities: Full ROM   Brief Hospital Course:  Donna B Kockis a 84 y.o.femalewho presented with AMS and L side Upper and lower focal deficits, as well as sepsis and volume overload suggestive of new onset heart failure.Altered mental status and FND are now believed to be the result of a seizure.PMH is significant forencephalomalacia of the right parietal lobe status post craniotomyfor meningioma,seizure d/o, CAD (mild plaque in 03/2012 cath), frequent falls and hypertension, subdural hematoma, HLD  Patient presented to the ED via ambulance after being found down at her home. Initially patient had altered mental status and  left sided weakness int he upper and lower extremities.  Head CT showed no signs of hemorrhagic stroke. She met sepsis criteria through tachycardia, tachypnea, elevated WBC. LA was 3.25. Chest X ray showed right middle lobe opacity indicating pneumonia.  Blood and urine cultures were drawn, and vancomycin, metronidazole, and aztreonam were started. Antibiotics were narrowed to doxycycline and completed on the 12th.      Neurology found that patient most likely had a seizure resulting in Todd's paralysis and contributed to her altered mental status along with the sepsis.  AMS improved over the next few days. Neurology ordered an EEG that showed evidence of recent seizure activity. Neurology recommend starting keppra 500mg BID and further workup of possible stroke.        Patient had a troponin level of 0.42 that arose to 1.16 and stayed elevated throughout her stay but was 0.16 on last check.  BNP was 593.  There was initially concern for stroke and MRI and ECHO were performed. Initially on the radiologist's read there was a punctate 4mm focus in the inferior right cerebellum indicating possible acute/subacute ischemic infarct, but upon later review by neurologist, it was believed to be an imaging artifact. Echocardiogram showed EF 60-65%, normal systolic function, grade 1 diastolic function. It also showed severe tricuspid regurgitation and severely increased pulmonary artery pressure of 62 mmHg. Cardiology performed a  Coronary CTA and VQ scan.  VQ scan did not show any evidence of PE. Cardiology made recommendations, scheduled follow up appointment and signed off.    Patient was discharged in improved, stable condition to her home, after refusing SNF placement.     Issues for Follow Up:  1. Medication changes:    1. cardiology recommended ASA 68m, atorvastatin 418m amlodipine 1052mimdur 40m29mily.  2. Neurology recommended keppra 500mg62m 3. We STOPPED the following medications until they could be  re-assessed at followup with PCP: adderall, gabapentin 4. We continued her other medications that she was taking pre-admission.   2. Cardiology is scheduling patient for pulmonary function testing and sleep study to help with diagnosis of her pulmonary hypertension.    3. Driving accommodations - patient was given information on resources to help her while she is on driving restrictions.  Neurology recommended no driving until she is seizure free for 6 months.   4. Neurology did not mention follow up in their notes, but patient will likely need a follow up with neurology at some point to discuss medication and recurrence of seizure.   5. Patient declined SNF and opted for home health PT/OT instead.  Assess if patient has been utilizing these services since discharge.  6. Assess patient depression status.  Patient verbalized thoughts of wanting to die during her admission.  Patient on prozac.   Significant Procedures: EEG  Significant Labs and Imaging:  Recent Labs  Lab 07/20/18 0511 07/22/18 0554 07/23/18 0502  WBC 9.3 9.1 7.7  HGB 12.1 12.3 11.5*  HCT 38.0 38.1 36.7  PLT 263 271 272   Recent Labs  Lab 07/19/18 0025 07/19/18 0548 07/20/18 0511 07/22/18 0554 07/23/18 0502  NA 137 137 137 139 141  K 2.6* 3.2* 4.5 4.0 3.6  CL 102 102 107 109 108  CO2 25 25 19* 22 24  GLUCOSE 105* 96 115* 100* 96  BUN _0 CREATININE 0.48 0.57 0.63 0.75 0.69  CALCIUM 8.4* 8.5* 8.6* 8.6* 8.7*  MG  --  1.9  --   --   --     Results/Tests Pending at Time of Discharge: none  Discharge Medications:  Allergies as of 07/23/2018      Reactions   Amoxicillin Anaphylaxis   Penicillins Anaphylaxis, Swelling   From childhood: Has patient had a PCN reaction causing immediate rash, facial/tongue/throat swelling, SOB or lightheadedness with hypotension: Yes Has patient had a PCN reaction causing severe rash involving mucus membranes or skin necrosis: Unk Has patient had a PCN reaction that  required hospitalization: No Has patient had a PCN reaction occurring within the last 10 years: No If all of the above answers are "NO", then may proceed with Cephalosporin use. .   PMarland Kitchenenobarbital Other (See Comments)   Reaction not recalled by the patient   Phenytoin Other (See Comments)   "Makes me feel funny"    Tape Rash   Please use paper tape      Medication List    STOP taking these medications   amphetamine-dextroamphetamine 10 MG tablet Commonly known as:  ADDERALL   gabapentin 600 MG tablet Commonly known as:  NEURONTIN     TAKE these medications   amLODipine 10 MG tablet Commonly known as:  NORVASC TAKE 1 TABLET BY MOUTH DAILY   aspirin 81 MG EC tablet Take 1 tablet (81 mg total) by mouth daily.   atorvastatin 40 MG tablet Commonly known as:  LIPITOR TAKE 1 TABLET BY MOUTH DAILY AT 6:00 PM What changed:    medication strength  See the new instructions.   Azelaic Acid 15 % cream Apply 1 application topically daily.   calcium-vitamin D 500-200 MG-UNIT tablet Commonly known as:  OSCAL WITH D Take 1 tablet by mouth daily with breakfast.  CoQ10 200 MG Caps Take 200 mg by mouth daily.   FLUoxetine 20 MG tablet Commonly known as:  PROZAC Take 1 tablet (20 mg total) by mouth daily. What changed:  how much to take   hydrochlorothiazide 12.5 MG capsule Commonly known as:  MICROZIDE TAKE ONE CAPSULE BY MOUTH EVERY DAY   isosorbide mononitrate 30 MG 24 hr tablet Commonly known as:  IMDUR Take 0.5 tablets (15 mg total) by mouth daily.   latanoprost 0.005 % ophthalmic solution Commonly known as:  XALATAN Place 1 drop into both eyes at bedtime.   levETIRAcetam 500 MG tablet Commonly known as:  KEPPRA TAKE 1 TABLET BY MOUTH TWICE DAILY   magnesium 30 MG tablet Take 30 mg by mouth 2 (two) times daily.       Discharge Instructions: Please refer to Patient Instructions section of EMR for full details.  Patient was counseled important signs and  symptoms that should prompt return to medical care, changes in medications, dietary instructions, activity restrictions, and follow up appointments.   Follow-Up Appointments: Follow-up Information    [facility] Follow up.   Specialty:  Cardiology Why:  You have been ordered for pulmonary function testing and a sleep study to be completed outpatient. Our schedulers will contact you directly with an appointment date/time.  Contact information: [address] [place] [phone]       Lonn Georgia, PA-C Follow up on 08/11/2018.   Specialties:  Cardiology, Radiology Why:  Please arrive 15 minutes early for your 9:00am post-hospital cardiology appointment Contact information: [address] [place] [address] [phone]        [name], MD Follow up.   Specialty:  Family Medicine Contact information: [place] [address] [phone]           Benay Pike, MD 07/24/2018, 7:44 PM PGY-1, [place]

## 2018-07-19 NOTE — Progress Notes (Signed)
CRITICAL VALUE ALERT  Critical Value: Potassium = 2.9  Date & Time Notied:  07/19/2018,  0120  Provider Notified: Maia Breslow  MD  Orders Received/Actions taken: see [facility]

## 2018-07-19 NOTE — Progress Notes (Signed)
Family Medicine Teaching Service Daily Progress Note Intern Pager: [phone]  Patient name: [name] Medical record number: [id number] Date of birth: December 05, 1930 Age: 84 y.o. Gender: female  Primary Care Provider: [name], MD Consultants: neuro, cards,  Code Status: Full code  Pt Overview and Major Events to Date:  Hospital Day 2 Admitted: 07/16/2018  Assessment and Plan: [name] is a 84 y.o. female who presented with AMS and L side Upper and lower focal deficits, as well as sepsis and volume overload suggestive of new onset heart failure. Altered mental status and FND are now believed to be the result of a seizure. PMH is significant forencephalomalacia of the right parietal lobe status post craniotomyfor meningioma,seizure d/o, CAD (mild plaque in 03/2012 cath), frequent falls and hypertension, subdural hematoma, HLD   Bipolar/depression - depressed topday, although much improved from Friday's exam.  Doesn't want to hurt herself but is stating she doesn't want to live in this world anymore.  States she is not suicidal though.   - restarting prozac.  - 24hr sitter.  - contact psych Monday AM re: capacity to make decisions in light of depression and thoughts of wanting to die.   Seizure causing AMS w/ L side deficit - resolved. patient was found down at home and brought to ED.  Initial labs showed wbc 16.2> 13.5, LA 3.25. CXR showed right lobar opacity. No evidence of hemorrhagic stroke on head CT. MRI brain showed increased diffusion abnormality in right temporal lobe suggesting seizure, inferior R cerebellum acute infarct, right parieto-occipital cranioplasty with underlying encephalomalacia. No evidence of large vessel occlusion on CTA. Neurology was consulted, and they suspect it may be todd's paralysis rather than stroke. Upon speaking with patient today her mental status is much improved and her focal neural deficits have resolved. Patient is much more talkative  and is less depressed than during exam from two days ago.  - f/u EEG - keppra 500mg  BID  - holding all neuroactive medication: gabapentin, prozac, adderall) - narrrowed Abx to doxycycline for CAP.  - PT/OT - neuro checks - statin 40mg  daily  Sepsis - resolved.  today patient is alert and oriented with no physical complaints. Her vitals have been within normal limits . On exam she had no acute complaints. CXR suggests R middle lobe opacity. Most likely source at this time is pneumonia though patient not endorsing cough or other respiratory signs.  Blood culture NG1D.  WBC 16.2> , LA 3.25>2.2> 1.0.  We will continue to treat empirically while we await cultures and trend LA and wb.  S.Pneumo, legionella negative.  - continue abx - f/u cultures. NG1D  troponemia - 0.81 >0.89>0.87>1.16>0.81>0.67>0.61. BMP is 593.3.  Cardiology believes it is a noncardiac cause.  ECHO showed nml systolic function, P8KD, EF 60-65%. PA peak pressure 44mmHg.  -f/u cards recs s/p echo.   Hypokalemia  - 2.6 overnight.  repleted 66mEq.  Rose to 3.2.  repleted 30meq.  - am BMP.   Acute hypercarbic resp failure - resolved. ABG has improved to 7.38, pco2 38.  pateint on room air now.   HTN - HCTZ 12.5 at home.  BP  179/78, SBP consistently in 160-170s.  - holding HCTZ in setting of low K.   - started amlodipine 5mg .   HLD - LDL 70.  Total cholesterol 122.  - lipid panel f/u - atorvostatin 40mg    Chronic pain - holding gabapentin       Fluids: none  . levETIRAcetam 500 mg (  07/18/18 2209)   Electrolytes: potassium repleted  Nutrition: regular diet GI ppx: none DVT px: none  Disposition: snf vs home.    Medications: Scheduled Meds: . atorvastatin  40 mg Oral q1800  . doxycycline  100 mg Oral Q12H  . FLUoxetine  20 mg Oral Daily  . potassium chloride  40 mEq Oral Once   Continuous Infusions: . levETIRAcetam 500 mg (07/18/18 2209)   PRN Meds: acetaminophen **OR** acetaminophen (TYLENOL)  oral liquid 160 mg/5 mL **OR** acetaminophen, hydrALAZINE  ================================================= ================================================= Subjective:  Patient states she is feeling better.  She really wants to drink a coke.  She thinks it is better than adderall for old people.  She says her potassium was low because she didn't have a banana this morning. She really wants to go home to her dogs because they awser what keep her going.  She does not want to kill herself, but she just wishes something would 'magically take her away from her troubles'.   She states she is leaving today regardless of what people say.  About two weeks ago she fell and hurt her left side of her ribs.    Objective: Temp:  [98.3 F (36.8 C)-99 F (37.2 C)] 98.3 F (36.8 C) (12/08 0801) Pulse Rate:  [70-73] 72 (12/08 0801) Resp:  [18] 18 (12/08 0314) BP: (148-178)/(73-85) 178/78 (12/08 0801) SpO2:  [97 %-99 %] 97 % (12/08 0801) Intake/Output 12/07 0701 - 12/08 0700 In: 360 [P.O.:360] Out: 1075 [Urine:1075] Physical Exam:  Gen: NAD, alert, non-toxic, sitting comfortably eating breakfast.    CV: Regular rate and rhythm.  No arrythmia detected.  Normal capillary refill bilaterally.  Radial pulses 2+ bilaterally. No bilateral lower extremity edema. Resp: some inspiratory ronchi heard in right lower lung field.  No wheezing,  No increased work of breathing appreciated. Abd: Nontender and nondistended on palpation to all 4 quadrants.  Positive bowel sounds. Psych: patient is much more talkative today.  She does not want to 'hurt' herself.  Patient was adamant about getting a diet coke.   Extremities: Full ROM    Laboratory: Recent Labs  Lab 07/16/18 2215 07/16/18 2358 07/17/18 0615  WBC  --  16.2* 13.5*  HGB 15.3* 12.6 12.1  HCT 45.0 40.7 38.2  PLT  --  286 284   Recent Labs  Lab 07/16/18 2207  07/16/18 2358 07/17/18 0615 07/19/18 0025 07/19/18 0548  NA  --    < > 135  134* 138  137 137  K  --    < > 3.6  3.6 3.2* 2.6* 3.2*  CL  --    < > 102  100 103 102 102  CO2  --    < > 20*  21* 24 25 25   BUN  --    < > 18  18 13 8 8   CREATININE  --    < > 0.95  0.92 0.70 0.48 0.57  CALCIUM  --    < > 8.9  8.8* 8.1* 8.4* 8.5*  PROT 7.7  --  7.0  --   --   --   BILITOT 0.6  --  0.3  --   --   --   ALKPHOS 115  --  104  --   --   --   ALT 19  --  18  --   --   --   AST 36  --  32  --   --   --   GLUCOSE  --    < >  192*  192* 155* 105* 96   < > = values in this interval not displayed.    Imaging/Diagnostic Tests: Dg Chest 2 View  Result Date: 07/18/2018 CLINICAL DATA:  Pulmonary edema. EXAM: CHEST - 2 VIEW COMPARISON:  CTA neck: 07/17/2018 and prior chest radiographs on 07/16/2018 and 07/15/2017 FINDINGS: Stable mild cardiomegaly. Mild diffuse pulmonary interstitial prominence is stable and not significantly changed compared to earlier exams. No evidence of pulmonary consolidation or pleural effusion. Right paratracheal soft tissue density is also stable, and due to ectasia of the great vessels which is demonstrated on recent neck CTA low lung volumes are noted. Right shoulder prosthesis again seen. IMPRESSION: Low lung volumes. Mild bilateral pulmonary interstitial prominence may be chronic, although mild interstitial edema cannot be excluded. Stable mild cardiomegaly. Electronically Signed   By: Earle Gell M.D.   On: 07/18/2018 09:38      Benay Pike, MD 07/19/2018, 8:37 AM PGY-1, [place] Intern pager: [phone], text pages welcome

## 2018-07-19 NOTE — Evaluation (Addendum)
Occupational Therapy Evaluation Patient Details Name: [name] MRN: [id number] DOB: 04/10/1931 Today's Date: 07/19/2018    History of Present Illness   84 yo female presenting to ED with AMS and left sided deficits. Pt with PMH including meningioma status post craniotomy on the right side, subdural hemorrhage with resultant right parietal encephalomalacia Anemia, Asthma, AVM, Cancer, Coronary artery disease, Depression, GI bleed, Hiatal hernia, HLD, HTN, Low back pain, OA, Obesity, Obesity, Ovarian cyst, Right wrist injury, Rotator cuff arthropathy, Seizures (1980s), and Tuberculosis. MRI of the brain shows diffusion abnormality involving the right mesial temporal lobe and hippocampus, and punctate focus of stroke in the right cerebellum suggestive of acute or subacute infarct.    Clinical Impression   PTA, pt lives alone and was performing BADLs and light IADLs. Pt reporting she doesn't have any support besides one neighbor who she can call; also reports her daughter's lives in [place]. Pt currently requiring Min A for LB ADLs and functional mobility due to poor balance. Pt performing room level mobility and required Min A for correction of LOB. Pt presenting with decreased cognition as seen by poor attention, memory, problem solving, and awareness. Pt requiring increased cues and time during ADLs and mobility. Pt very determined to return home to her two dogs. Pt would benefit from further acute OT to facilitate safe dc. Due to poor balance, cognition, and home support, recommend dc to SNF for further OT to optimize safety, independence with ADLs, and return to PLOF.     Follow Up Recommendations  SNF;Supervision/Assistance - 24 hour If pt denies SNF, will need 24/7 support and HHOT.    Equipment Recommendations  Tub/shower seat;Other (comment)(Defer to next venue)    Recommendations for Other Services PT consult;Speech consult     Precautions / Restrictions Precautions Precautions:  Fall(Decreased awareness)      Mobility Bed Mobility Overal bed mobility: Needs Assistance Bed Mobility: Supine to Sit     Supine to sit: Min guard     General bed mobility comments: Min Guard A for safety  Transfers Overall transfer level: Needs assistance Equipment used: None Transfers: Sit to/from Stand Sit to Stand: Min guard         General transfer comment: Min Guard A for safety    Balance                                           ADL either performed or assessed with clinical judgement   ADL Overall ADL's : Needs assistance/impaired Eating/Feeding: Independent;Sitting   Grooming: Min guard;Oral care;Wash/dry face;Wash/dry hands;Brushing hair;Standing Grooming Details (indicate cue type and reason): Min Guard A for safety. Pt requiring cues for locating items and asking mutiple questions such as "where are those wash clothes" when they are beside her on the sink or "is this dry?" as she is holding the dry wash clothe Upper Body Bathing: Min guard;Sitting   Lower Body Bathing: Minimal assistance;Sit to/from stand   Upper Body Dressing : Sitting;Minimal assistance Upper Body Dressing Details (indicate cue type and reason): Due to shoulder ROM limitations. Lower Body Dressing: Minimal assistance;Sit to/from stand;Adhering to hip precautions   Toilet Transfer: Minimal assistance;Ambulation(simulated to recliner)           Functional mobility during ADLs: Minimal assistance General ADL Comments: Pt with decreased balance and cognition. Pt reports she plans to leave AMA today if the MD doesn't  dc her. Pt with poor awareness of deficits and cognitive status. Pt performing grooming at sink with Min Guard A for safety. Requiring Min A for dyanmic standing balance due to poor balance. Pt with multiple LOB during mobility in room requiring Min A to correct     Vision Baseline Vision/History: Wears glasses Wears Glasses: At all times Patient  Visual Report: No change from baseline       Perception     Praxis      Pertinent Vitals/Pain Pain Assessment: No/denies pain     Hand Dominance Right   Extremity/Trunk Assessment Upper Extremity Assessment Upper Extremity Assessment: LUE deficits/detail LUE Deficits / Details: Decreased ROM at shoulder and pt reports this is baseline.  LUE Coordination: decreased gross motor   Lower Extremity Assessment Lower Extremity Assessment: Defer to PT evaluation   Cervical / Trunk Assessment Cervical / Trunk Assessment: Kyphotic   Communication Communication Communication: No difficulties   Cognition Arousal/Alertness: Awake/alert Behavior During Therapy: WFL for tasks assessed/performed Overall Cognitive Status: No family/caregiver present to determine baseline cognitive functioning                                 General Comments: Perseverating on note having her makeup and "I hate the way I look." Pt with decreased attention and requiring increased cues and time during ADLs. Pt with poor awareness of deficits and safety. Throughout session, pt perseverating on needing her make up, that she would leave today, and she wouldn't die because she couldn't leave her dogs in a cruel world.     General Comments  Pt's neighbor which she reports will be assisting her discussed with OT that she is "tired and can't take care of her". Neighor also reports that one of the daughters says the pt can move to her home in [place].    Exercises     Shoulder Instructions      Home Living Family/patient expects to be discharged to:: Private residence Living Arrangements: Alone Available Help at Discharge: Friend(s);Available 24 hours/day;Neighbor Type of Home: House Home Access: Stairs to enter [employer] of Steps: 1   Home Layout: One level     Bathroom Shower/Tub: Occupational psychologist: Handicapped height     Home Equipment: Environmental consultant - 2 wheels;Shower  seat   Additional Comments: She reports she has two daughters who live in [place]      Prior Functioning/Environment Level of Independence: Independent        Comments: ADLs and simple IADLs. Has two dogs that she loves        OT Problem List: Decreased activity tolerance;Impaired balance (sitting and/or standing);Decreased safety awareness;Decreased knowledge of use of DME or AE;Decreased cognition;Decreased knowledge of precautions      OT Treatment/Interventions: Self-care/ADL training;Therapeutic exercise;Energy conservation;DME and/or AE instruction;Therapeutic activities;Patient/family education    OT Goals(Current goals can be found in the care plan section) Acute Rehab OT Goals Patient Stated Goal: "Go home to my two dogs" OT Goal Formulation: With patient Time For Goal Achievement: 08/02/18 Potential to Achieve Goals: Good ADL Goals Pt Will Perform Grooming: with modified independence;standing Pt Will Perform Lower Body Dressing: with modified independence;sit to/from stand Pt Will Transfer to Toilet: with modified independence;ambulating;regular height toilet Pt Will Perform Toileting - Clothing Manipulation and hygiene: with modified independence;sit to/from stand Additional ADL Goal #1: Pt will collect items in prepration for grooming tasks with 1-2 cues Additional ADL Goal #  2: Pt will perform three part trail making task with 2-3 cues  OT Frequency: Min 2X/week   Barriers to D/C: Decreased caregiver support  Daughters live in [place]. Has been relying heavily on a neighbor.       Co-evaluation              AM-PAC OT "6 Clicks" Daily Activity     Outcome Measure Help from another person eating meals?: None Help from another person taking care of personal grooming?: A Little Help from another person toileting, which includes using toliet, bedpan, or urinal?: A Little Help from another person bathing (including washing, rinsing, drying)?: A Little Help from  another person to put on and taking off regular upper body clothing?: A Little Help from another person to put on and taking off regular lower body clothing?: A Little 6 Click Score: 19   End of Session Equipment Utilized During Treatment: Gait belt Nurse Communication: Mobility status  Activity Tolerance: Patient tolerated treatment well Patient left: in chair;with call bell/phone within reach;with chair alarm set  OT Visit Diagnosis: Unsteadiness on feet (R26.81);Other abnormalities of gait and mobility (R26.89);Muscle weakness (generalized) (M62.81);History of falling (Z91.81);Other symptoms and signs involving cognitive function                Time: 4270-6237 OT Time Calculation (min): 30 min Charges:  OT General Charges $OT Visit: 1 Visit OT Evaluation $OT Eval Moderate Complexity: 1 Mod OT Treatments $Self Care/Home Management : 8-22 mins  Broderic Bara MSOT, OTR/L Acute Rehab Pager: [phone] Office: [place] 07/19/2018, 11:33 AM

## 2018-07-20 ENCOUNTER — Encounter (HOSPITAL_COMMUNITY): Payer: Self-pay | Admitting: *Deleted

## 2018-07-20 ENCOUNTER — Inpatient Hospital Stay (HOSPITAL_COMMUNITY): Payer: Medicare Other

## 2018-07-20 DIAGNOSIS — R9430 Abnormal result of cardiovascular function study, unspecified: Secondary | ICD-10-CM

## 2018-07-20 LAB — CBC WITH DIFFERENTIAL/PLATELET
Abs Immature Granulocytes: 0.06 10*3/uL (ref 0.00–0.07)
Basophils Absolute: 0 10*3/uL (ref 0.0–0.1)
Basophils Relative: 0 %
Eosinophils Absolute: 0.1 10*3/uL (ref 0.0–0.5)
Eosinophils Relative: 1 %
HCT: 38 % (ref 36.0–46.0)
Hemoglobin: 12.1 g/dL (ref 12.0–15.0)
IMMATURE GRANULOCYTES: 1 %
Lymphocytes Relative: 19 %
Lymphs Abs: 1.7 10*3/uL (ref 0.7–4.0)
MCH: 28.3 pg (ref 26.0–34.0)
MCHC: 31.8 g/dL (ref 30.0–36.0)
MCV: 88.8 fL (ref 80.0–100.0)
Monocytes Absolute: 1.1 10*3/uL — ABNORMAL HIGH (ref 0.1–1.0)
Monocytes Relative: 12 %
Neutro Abs: 6.3 10*3/uL (ref 1.7–7.7)
Neutrophils Relative %: 67 %
Platelets: 263 10*3/uL (ref 150–400)
RBC: 4.28 MIL/uL (ref 3.87–5.11)
RDW: 13.8 % (ref 11.5–15.5)
WBC: 9.3 10*3/uL (ref 4.0–10.5)
nRBC: 0 % (ref 0.0–0.2)

## 2018-07-20 LAB — BASIC METABOLIC PANEL
ANION GAP: 11 (ref 5–15)
BUN: 13 mg/dL (ref 8–23)
CO2: 19 mmol/L — ABNORMAL LOW (ref 22–32)
Calcium: 8.6 mg/dL — ABNORMAL LOW (ref 8.9–10.3)
Chloride: 107 mmol/L (ref 98–111)
Creatinine, Ser: 0.63 mg/dL (ref 0.44–1.00)
GFR calc Af Amer: >60 mL/min (ref >60)
GFR calc non Af Amer: >60 mL/min (ref >60)
Glucose, Bld: 115 mg/dL — ABNORMAL HIGH (ref 70–99)
Potassium: 4.5 mmol/L (ref 3.5–5.1)
Sodium: 137 mmol/L (ref 135–145)

## 2018-07-20 LAB — TROPONIN I: Troponin I: 0.16 ng/mL (ref <0.03)

## 2018-07-20 MED ORDER — LEVETIRACETAM 500 MG PO TABS
500.0000 mg | ORAL_TABLET | Freq: Two times a day (BID) | ORAL | Status: DC
  Administered 2018-07-20 – 2018-07-23 (×6): 500 mg via ORAL
  Filled 2018-07-20 (×6): qty 1

## 2018-07-20 MED ORDER — NITROGLYCERIN 0.4 MG SL SUBL
0.8000 mg | SUBLINGUAL_TABLET | Freq: Once | SUBLINGUAL | Status: AC
  Administered 2018-07-20: 0.8 mg via SUBLINGUAL

## 2018-07-20 MED ORDER — NITROGLYCERIN 0.4 MG SL SUBL
SUBLINGUAL_TABLET | SUBLINGUAL | Status: AC
  Filled 2018-07-20: qty 2

## 2018-07-20 MED ORDER — METOPROLOL TARTRATE 50 MG PO TABS
50.0000 mg | ORAL_TABLET | Freq: Once | ORAL | Status: AC
  Administered 2018-07-20: 50 mg via ORAL
  Filled 2018-07-20: qty 1

## 2018-07-20 MED ORDER — DICLOFENAC SODIUM 1 % TD GEL
2.0000 g | Freq: Once | TRANSDERMAL | Status: AC
  Administered 2018-07-20: 2 g via TOPICAL
  Filled 2018-07-20: qty 100

## 2018-07-20 MED ORDER — IOPAMIDOL (ISOVUE-370) INJECTION 76%
100.0000 mL | Freq: Once | INTRAVENOUS | Status: AC | PRN
  Administered 2018-07-20: 100 mL via INTRAVENOUS

## 2018-07-20 MED ORDER — IBUPROFEN 200 MG PO TABS
600.0000 mg | ORAL_TABLET | Freq: Four times a day (QID) | ORAL | Status: AC | PRN
  Administered 2018-07-20 – 2018-07-21 (×2): 600 mg via ORAL
  Filled 2018-07-20 (×2): qty 3

## 2018-07-20 NOTE — Progress Notes (Signed)
FPTS Interim Progress Note:   Paged by RN that patient was agitated and wanting to leave AMA.  Evaluated patient at bedside.  Patient was upset that there was so much light in her room as she was trying to sleep, was wanting to go home to get better night's rest. She also notes on our initial conversation that she is worried that her sitter and nurse are "on drugs" as they were whispering to each other and she felt that her nurse was unsteady on her feet while walking her to the restroom. Stating "I was going to wait until it was home and call anonymously to report this". Lastly, she was complaining of chest pain while arguing with the nurse.  After further discussion, she states this pain is been present for several weeks, starting after her fall onto that side. She was able to calm down after discussion about her family, hobbies, and what makes her happy.  She denies any SI, HI.  Once relaxed, she is willing to stay if we provide further evidence in the morning that she has had a seizure.  She would like some ibuprofen to help her MSK pain.   O: Blood pressure (!) 161/75, pulse 74, temperature 98.6 F (37 C), temperature source Oral, resp. rate 18, height 5\' 5"  (1.651 m), weight 82.3 kg, SpO2 98 %.  GEN: Alert, NAD Cardiac: Regular rate and rhythm Chest: No increased work of breathing, on RA, slight tenderness to palpation of her anterior axillary line on her left chest Psych: Paranoid, agitated initially, however redirectable to more pleasant conversations.  Forgetful, often repeating same concerns and conversations just had.  A/p: Patient pleasant by the end of our conversation and willing to stay with sitter in her room for tonight.  Likely sundowning.  Discussed with sitter and nursing staff to avoid whispering conversations while she is awake, and to also limit as much light as possible.  Will provide some ibuprofen for her MSK pain at request (patient states this is all she needs whenever she is  in pain), cardiology has already been following and do not believe this is cardiac in origin. -Ibuprofen 600 mg once, in hopes that this will help her sleep once her pain is down as she is already very tired -Can consider Seroquel if patient becomes more agitated -Discuss with day team for patient's request of seizure documentation  Patriciaann Clan, DO  Family Medicine PGY-1

## 2018-07-20 NOTE — Evaluation (Signed)
Speech Language Pathology Evaluation Patient Details Name: [name] MRN: [id number] DOB: 05-08-1931 Today's Date: 07/20/2018 Time: 1010-1050 SLP Time Calculation (min) (ACUTE ONLY): 40 min  Problem List:  Patient Active Problem List   Diagnosis Date Noted  . Elevated troponin   . Focal neurological deficit 07/17/2018  . Altered mental status   . Pain   . Preoperative general physical examination 12/25/2017  . Frequent falls 12/25/2017  . Weight loss, non-intentional 08/14/2017  . S/P shoulder replacement 03/29/2016  . Diverticulitis of colon without hemorrhage 12/04/2015  . Rotator cuff syndrome of left shoulder 10/26/2015  . Cervical disc disorder with radiculopathy of cervical region 10/26/2015  . Heart murmur, systolic 16/60/6301  . Rosacea 09/07/2015  . Adenomatous colon polyp 04/12/2014  . Hearing loss in left ear 04/13/2013  . Coronary artery disease 03/25/2012  . Osteoporosis 11/13/2011  . ADD (attention deficit disorder) 02/22/2011  . Pure hypercholesterolemia 10/09/2006  . Major depression, recurrent, chronic ([place]) 10/09/2006  . Hereditary and idiopathic peripheral neuropathy 10/09/2006  . HYPERTENSION, BENIGN SYSTEMIC 10/09/2006  . OSTEOARTHRITIS, MULTI SITES 10/09/2006  . CONVULSIONS, SEIZURES, NOS 10/09/2006   Past Medical History:  Past Medical History:  Diagnosis Date  . Anemia   . Asthma    once in 1950's  . AVM (arteriovenous malformation)   . Cancer (HCC)     right leg  skin  . Coronary artery disease   . Depression    sucide attempt in the distant past  . Excessive sweating   . GI bleed   . Hiatal hernia    s/p repair  . HLD (hyperlipidemia)   . HTN (hypertension)   . Hypertension   . Low back pain    ESI by Dr. Nelva Bush 2 weeks ago  . OA (osteoarthritis)    bilateral wrist, bilateral knees  . Obesity   . Obesity   . Ovarian cyst   . Right wrist injury    multiple surgeries and residual weakness.   . Rotator cuff arthropathy    right  . Seizures ([facility]) 1980s   2/2 meningioma. none since cranioplasty  . Tuberculosis    + tb test      (father had)   Past Surgical History:  Past Surgical History:  Procedure Laterality Date  . ABDOMINAL HYSTERECTOMY     left ovaries  . BREAST REDUCTION SURGERY    . CATARACT EXTRACTION Bilateral   . CRANIOPLASTY     2/2 meningioma 1980s  . CYSTECTOMY     rt ovary  age 20   . HIATAL HERNIA REPAIR    . KNEE ARTHROPLASTY     bilat  . LEFT HEART CATHETERIZATION WITH CORONARY ANGIOGRAM N/A 03/26/2012   Procedure: LEFT HEART CATHETERIZATION WITH CORONARY ANGIOGRAM;  Surgeon: Minus Breeding, MD;  Location: [facility] CATH LAB;  Service: Cardiovascular;  Laterality: N/A;  . REVERSE SHOULDER ARTHROPLASTY Right 03/29/2016  . REVERSE SHOULDER ARTHROPLASTY Right 03/29/2016   Procedure: RIGHT REVERSE SHOULDER ARTHROPLASTY;  Surgeon: Netta Cedars, MD;  Location: [facility];  Service: Orthopedics;  Laterality: Right;   HPI:  84 yo female admitted 07/16/18 with AMS and left sided deficits. PMH: meningioma status post craniotomy on the right side, SDH with resultant right parietal encephalomalacia, Anemia, Asthma, AVM, Cancer, CAD, Depression, GI bleed, Hiatal hernia, HLD, HTN, Low back pain, OA, Obesity, Ovarian cyst, Right wrist injury, Rotator cuff arthropathy, Seizures (1980s), and Tuberculosis. MRI of the brain shows diffusion abnormality involving the right mesial temporal lobe and hippocampus, and punctate focus  of stroke in the right cerebellum suggestive of acute or subacute infarct.   Assessment / Plan / Recommendation Clinical Impression  The SLUMS Examination ([place] Mental Status Exam) was administered. Pt scored 23/30 (n=27+/30), indicating mild neurocognitive disorder. Points were lost on mental calculations, delayed recall, and clock drawing task. This score raises concern for pt safety living alone, however, pt is able to verbally explain and rationalize why she is safe alone. Given  age, frequent falls at home, and scores today, supervision is recommended after acute DC.     SLP Assessment  SLP Recommendation/Assessment: All further Speech Language Pathology  needs can be addressed in the next venue of care SLP Visit Diagnosis: Cognitive communication deficit (R41.841)    Follow Up Recommendations  24 hour supervision/assistance       SLP Evaluation Cognition  Overall Cognitive Status: No family/caregiver present to determine baseline cognitive functioning Arousal/Alertness: Awake/alert Orientation Level: Oriented X4       Comprehension  Auditory Comprehension Overall Auditory Comprehension: Appears within functional limits for tasks assessed    Expression Expression Primary Mode of Expression: Verbal Verbal Expression Overall Verbal Expression: Appears within functional limits for tasks assessed Written Expression Dominant Hand: Right   Oral / Motor  Oral Motor/Sensory Function Overall Oral Motor/Sensory Function: Within functional limits Motor Speech Overall Motor Speech: Appears within functional limits for tasks assessed   GO                   Markiya Keefe B[facility], CCC-SLP Speech Language Pathologist [phone]  Shonna Chock 07/20/2018, 11:00 AM

## 2018-07-20 NOTE — Evaluation (Signed)
Physical Therapy Evaluation Patient Details Name: [name] MRN: [id number] DOB: August 09, 1931 Today's Date: 07/20/2018   History of Present Illness  85 yo female presenting to ED with AMS and left sided deficits. Pt with PMH including meningioma status post craniotomy on the right side, subdural hemorrhage with resultant right parietal encephalomalacia Anemia, Asthma, AVM, Cancer, Coronary artery disease, Depression, GI bleed, Hiatal hernia, HLD, HTN, Low back pain, OA, Obesity, Obesity, Ovarian cyst, Right wrist injury, Rotator cuff arthropathy, Seizures (1980s), and Tuberculosis. MRI of the brain shows diffusion abnormality involving the right mesial temporal lobe and hippocampus, and punctate focus of stroke in the right cerebellum suggestive of acute or subacute infarct.    Clinical Impression  [name] is a pleasant 84 y/o female admitted with the above listed diagnosis. Patient reports that prior to admission she was Mod I with mobility and ADLs. Patient today requiring up to [facility] for mobility due to reduced balance and reduced general safety and surrounding awareness. Patient with noted unsteadiness with turning and navigating obstacles in hallway increasing her fall risk. PT to recommend short term SNF to progress safety and independence with functional mobility. PT to continue to follow acutely.     Follow Up Recommendations SNF;Supervision/Assistance - 24 hour(if patient denies - will require HHPT and 24/hr supervision)    Equipment Recommendations  None recommended by PT    Recommendations for Other Services OT consult(as ordered)     Precautions / Restrictions Precautions Precautions: Fall(Decreased awareness) Restrictions Weight Bearing Restrictions: No      Mobility  Bed Mobility Overal bed mobility: Needs Assistance Bed Mobility: Supine to Sit     Supine to sit: Min guard     General bed mobility comments: Min Guard A for safety  Transfers Overall  transfer level: Needs assistance Equipment used: None Transfers: Sit to/from Stand Sit to Stand: Min guard         General transfer comment: Min Guard A for safety; noted unsteadiness even with static standing with patient reaching for objects PT for stability  Ambulation/Gait Ambulation/Gait assistance: Min assist;Min guard Gait Distance (Feet): 100 Feet Assistive device: 1 person hand held assist Gait Pattern/deviations: Step-through pattern;Decreased stride length;Trunk flexed;Drifts right/left Gait velocity: decreased   General Gait Details: patient requiring 1 HHA and intermittent use of handrail in hallway for stability; unsterady throughout requiring up to Min A to maintain balance; some difficulty with with navigating around objects  Stairs            Wheelchair Mobility    Modified Rankin (Stroke Patients Only)       Balance Overall balance assessment: Needs assistance Sitting-balance support: No upper extremity supported;Feet supported Sitting balance-Leahy Scale: Fair     Standing balance support: Single extremity supported;During functional activity Standing balance-Leahy Scale: Poor                               Pertinent Vitals/Pain Pain Assessment: No/denies pain    Home Living Family/patient expects to be discharged to:: Private residence Living Arrangements: Alone Available Help at Discharge: Friend(s);Available 24 hours/day;Neighbor Type of Home: House Home Access: Stairs to enter   [employer] of Steps: 1 Home Layout: One level Home Equipment: Walker - 2 wheels;Shower seat Additional Comments: She reports she has two daughters who live in [place]    Prior Function Level of Independence: Independent         Comments: ADLs and simple IADLs. Has  two dogs that she loves     Hand Dominance   Dominant Hand: Right    Extremity/Trunk Assessment   Upper Extremity Assessment Upper Extremity Assessment: Defer  to OT evaluation    Lower Extremity Assessment Lower Extremity Assessment: Generalized weakness    Cervical / Trunk Assessment Cervical / Trunk Assessment: Kyphotic  Communication   Communication: No difficulties  Cognition Arousal/Alertness: Awake/alert Behavior During Therapy: WFL for tasks assessed/performed Overall Cognitive Status: No family/caregiver present to determine baseline cognitive functioning                                 General Comments: very preoccupied on having her makeup done before exiting the room stating "I'm never going to find a man looking like this"      General Comments      Exercises     Assessment/Plan    PT Assessment Patient needs continued PT services  PT Problem List Decreased strength;Decreased activity tolerance;Decreased balance;Decreased mobility;Decreased safety awareness       PT Treatment Interventions DME instruction;Gait training;Stair training;Functional mobility training;Therapeutic activities;Therapeutic exercise;Balance training;Patient/family education    PT Goals (Current goals can be found in the Care Plan section)  Acute Rehab PT Goals Patient Stated Goal: "Go home to my two dogs" PT Goal Formulation: With patient Time For Goal Achievement: 08/03/18 Potential to Achieve Goals: Good    Frequency Min 2X/week   Barriers to discharge        Co-evaluation               AM-PAC PT "6 Clicks" Mobility  Outcome Measure Help needed turning from your back to your side while in a flat bed without using bedrails?: A Little Help needed moving from lying on your back to sitting on the side of a flat bed without using bedrails?: A Little Help needed moving to and from a bed to a chair (including a wheelchair)?: A Little Help needed standing up from a chair using your arms (e.g., wheelchair or bedside chair)?: A Little Help needed to walk in hospital room?: A Little Help needed climbing 3-5 steps with a  railing? : A Lot 6 Click Score: 17    End of Session Equipment Utilized During Treatment: Gait belt Activity Tolerance: Patient tolerated treatment well Patient left: in chair;with call bell/phone within reach;with chair alarm set;with nursing/sitter in room Nurse Communication: Mobility status PT Visit Diagnosis: Unsteadiness on feet (R26.81);Other abnormalities of gait and mobility (R26.89);Muscle weakness (generalized) (M62.81)    Time: 4010-2725 PT Time Calculation (min) (ACUTE ONLY): 27 min   Charges:   PT Evaluation $PT Eval Moderate Complexity: 1 Mod PT Treatments $Gait Training: 8-22 mins       Lanney Gins, PT, DPT Supplemental Physical Therapist 07/20/18 8:48 AM Pager: [phone] Office: [phone]

## 2018-07-20 NOTE — Procedures (Signed)
ELECTROENCEPHALOGRAM REPORT   Patient: [name]       Room #: 3U13H EEG No. ID: [id number] Age: 84 y.o.        Sex: female Referring Physician: Chambliss Report Date:  07/20/2018        Interpreting Physician: Alexis Goodell  History: [name] is an 84 y.o. female with history of right craniotomy and seizures presenting with altered mental status  Medications:  Norvasc, Lipitor, Voltaren, Doxycycline, Prozac, Kepra  Conditions of Recording:  This is a 21 channel routine scalp EEG performed with bipolar and monopolar montages arranged in accordance to the international 10/20 system of electrode placement. One channel was dedicated to EKG recording.  The patient is in the awake state.  Description:  The waking background activity consists of a low voltage, fairly well organized, 8 Hz alpha activity, seen from the parieto-occipital and posterior temporal regions.  Low voltage fast activity, poorly organized, is seen anteriorly and is at times superimposed on more posterior regions.  A mixture of theta and alpha rhythms are seen from the central and temporal regions.  This activity is not as well maintained over the right hemisphere where there is noted slowing due to underlying theta and beta activity that os of higher voltage than that noted over the left hemisphere.  Also noted are frequent right temporoparietal sharp transients The patient does not drowse or sleep. Hyperventilation and intermittent photic stimulation were not performed.   IMPRESSION: This is an abnormal electroencephalogram secondary to right hemispheric slowing and increased voltage with frequent right temporoparietal sharp transients.     Alexis Goodell, MD Neurology [phone] 07/20/2018, 12:49 PM

## 2018-07-20 NOTE — Care Management Important Message (Signed)
Important Message  Patient Details  Name: [name] MRN: [id number] Date of Birth: Mar 11, 1931   Medicare Important Message Given:  Yes    Orbie Pyo 07/20/2018, 4:25 PM

## 2018-07-20 NOTE — Progress Notes (Signed)
FPTS Interim Progress Note:   S: Paged by RN for chest pain. Evaluated patient at bedside.  Patient states she has had this chest pain for the past 2 weeks after a fall onto her left side, however woke up from this pain acutely around about 5:30 AM as she was sleeping on her left side.  Pain is lateral on her left and near her xiphoid process.  Unable to describe the pain, however states it is worse when she takes in a deep breath and when she lays on her left side.  No particular change while doing activity.  Denies any associated diaphoresis, SOB, paresthesias, or palpitations.   O: Blood pressure (!) 171/79, pulse 73, temperature 98.4 F (36.9 C), temperature source Oral, resp. rate 18, height 5\' 5"  (1.651 m), weight 82.3 kg, SpO2 98 %.  General: Alert, NAD HEENT: NCAT, MMM Cardiac: RRR 2/6 systolic murmur, tender to palpation of 5-6 ICS and around xiphoid process. Lungs: Clear bilaterally, no increased WOB on RA Abdomen: soft, non-tender, non-distended, normoactive BS Msk: Moves all extremities spontaneously  Ext: Warm, dry, 2+ distal pulses, no edema  Psych: Calm, appropriate affect at this time  A/P:  Atypical chest pain:  Likely costochondritis/musculoskeletal as patient is tender to palpation on exam with worsening of the pain while laying on her side in the setting of a recent fall onto her left side. Considered cardiac origin given her risk factors, fortunately they have already been trending her troponins which have come down and cardiology on board.  Although I have a low suspicion this is cardiac in origin, I will obtain another troponin to ensure this is following in trend and an EKG.  Unlikely related to pulmonary source as her lungs are clear and she is having no increased work of breathing or desaturations. -Voltaren gel to the area -Troponin x1, trend if needed  -EKG to assess for any changes to prior -Could consider additional imaging of ribs   Continue management per day  team.  Patriciaann Clan, DO  Family Medicine PGY-1

## 2018-07-20 NOTE — Progress Notes (Signed)
Progress Note  Patient Name: [name] Date of Encounter: 07/20/2018  Primary Cardiologist: No primary care provider on file.   Subjective   MS changes have resolved.  In talking with her she denies any hx of anginal CP and is very active at home. She lives by herself and works out I her yard, although not this past summer due to the heat.  2 weeks ago she tripped on some leaves outside and fell on her left side and since then has had left shoulder and chest wall pain, worse with deep breathing.    Inpatient Medications    Scheduled Meds: . amLODipine  5 mg Oral Daily  . atorvastatin  40 mg Oral q1800  . diclofenac sodium  2 g Topical Once  . doxycycline  100 mg Oral Q12H  . FLUoxetine  20 mg Oral Daily   Continuous Infusions: . levETIRAcetam 500 mg (07/19/18 2222)   PRN Meds: acetaminophen **OR** acetaminophen (TYLENOL) oral liquid 160 mg/5 mL **OR** acetaminophen, hydrALAZINE, ibuprofen   Vital Signs    Vitals:   07/19/18 2043 07/19/18 2331 07/20/18 0518 07/20/18 0814  BP: (!) 144/68 (!) 161/75 (!) 171/79 (!) 175/82  Pulse: 74 74 73 66  Resp: 18 18 18 20   Temp: 98.3 F (36.8 C) 98.6 F (37 C) 98.4 F (36.9 C) 98.5 F (36.9 C)  TempSrc: Oral Oral Oral Oral  SpO2: 98% 98% 98% 98%  Weight:      Height:        Intake/Output Summary (Last 24 hours) at 07/20/2018 0925 Last data filed at 07/20/2018 0854 Gross per 24 hour  Intake 1200 ml  Output -  Net 1200 ml   Filed Weights   07/16/18 2206 07/17/18 0322  Weight: 81.6 kg 82.3 kg    Telemetry    NSR - Personally Reviewed  ECG    NSR with LAFB, septal infarct, LVH by voltage - Personally Reviewed  Physical Exam   GEN: No acute distress.   Neck: No JVD Cardiac: RRR, no rubs, or gallops. 2/6 SM at RUSB Respiratory: Clear to auscultation bilaterally. GI: Soft, nontender, non-distended  MS: No edema; No deformity. Neuro:  Nonfocal  Psych: Normal affect   Labs    Chemistry Recent Labs  Lab  07/16/18 2207  07/16/18 2358  07/19/18 0025 07/19/18 0548 07/20/18 0511  NA  --    < > 135  134*   < > 137 137 137  K  --    < > 3.6  3.6   < > 2.6* 3.2* 4.5  CL  --    < > 102  100   < > 102 102 107  CO2  --   --  20*  21*   < > 25 25 19*  GLUCOSE  --    < > 192*  192*   < > 105* 96 115*  BUN  --    < > 18  18   < > 8 8 13   CREATININE  --    < > 0.95  0.92   < > 0.48 0.57 0.63  CALCIUM  --   --  8.9  8.8*   < > 8.4* 8.5* 8.6*  PROT 7.7  --  7.0  --   --   --   --   ALBUMIN 3.7  --  3.4*  --   --   --   --   AST 36  --  32  --   --   --   --  ALT 19  --  18  --   --   --   --   ALKPHOS 115  --  104  --   --   --   --   BILITOT 0.6  --  0.3  --   --   --   --   GFRNONAA  --   --  55*  57*   < > >60 >60 >60  GFRAA  --   --  >60  >60   < > >60 >60 >60  ANIONGAP  --   --  13  13   < > 10 10 11    < > = values in this interval not displayed.     Hematology Recent Labs  Lab 07/16/18 2358 07/17/18 0615 07/20/18 0511  WBC 16.2* 13.5* 9.3  RBC 4.50 4.29 4.28  HGB 12.6 12.1 12.1  HCT 40.7 38.2 38.0  MCV 90.4 89.0 88.8  MCH 28.0 28.2 28.3  MCHC 31.0 31.7 31.8  RDW 13.6 13.8 13.8  PLT 286 284 263    Cardiac Enzymes Recent Labs  Lab 07/18/18 1219 07/18/18 1837 07/19/18 0025 07/20/18 0511  TROPONINI 0.81* 0.67* 0.61* 0.16*    Recent Labs  Lab 07/16/18 2212  TROPIPOC 0.42*     BNP Recent Labs  Lab 07/16/18 2358  BNP 593.3*     DDimer No results for input(s): DDIMER in the last 168 hours.   Radiology    No results found.  Cardiac Studies   2D echo Study Conclusions  - Left ventricle: The cavity size was normal. There was moderate   hyperterophy of the basal septum and otherwise mild concentric   LVH. Systolic function was normal. The estimated ejection   fraction was in the range of 60% to 65%. Wall motion was normal;   there were no regional wall motion abnormalities. Doppler   parameters are consistent with abnormal left ventricular    relaxation (grade 1 diastolic dysfunction). Doppler parameters   are consistent with high ventricular filling pressure. - Aortic valve: Valve mobility was restricted. Transvalvular   velocity was within the normal range. There was no stenosis.   There was no regurgitation. - Mitral valve: Calcified annulus. Transvalvular velocity was   within the normal range. There was no evidence for stenosis.   There was mild regurgitation. - Left atrium: The atrium was severely dilated. - Right ventricle: The cavity size was normal. Wall thickness was   normal. Systolic function was normal. - Tricuspid valve: There was severe regurgitation. - Pulmonary arteries: Systolic pressure was severely increased. PA   peak pressure: 62 mm Hg (S).  Patient Profile     84 y.o. female with a hx of meningioma status post craniotomy on the right side, subdural hemorrhage with resulting right parietal encephalomalacia, seizure disorders, HTN, HLD, depression, falls and prior suicidal Ideationwho is being seen today for the evaluation ofElevated troponinat the request of Dr. Erin Hearing.    Assessment & Plan    1.  Elevated troponin -troponin 0.59>0.91>0.89>0.87>1.16>0.81>0.67>0.61>0.16 -2D echo with normal LVF and no RWMAs therefore trop elevation unlikely to be related to underlying MI -CP is very atypical and occurred after a fall when she landed on her left side and has been constant for 2 weeks worse with movement and deep breathing and likely MSK. -she is very active and functional at home so would pursue further ischemic workup.   -will get coronary CTA today to define coronary anatomy.   2.  Heart murmur -  echo with severe TR and moderate pulmonary HTN  3.  Severe TR -? Etiology  -PASP 69mmHg so likely related to pulmonary HTN -Chest CTA/coronary CTA will help evaluate for PE -check PFTs -RV size and function normal on echo       For questions or updates, please contact [place] Please  consult [email or web address] for contact info under Cardiology/STEMI.      Signed, Fransico Him, MD  07/20/2018, 9:25 AM

## 2018-07-20 NOTE — Progress Notes (Signed)
EEG complete - results pending 

## 2018-07-20 NOTE — Progress Notes (Signed)
Family Medicine Teaching Service Daily Progress Note Intern Pager: [phone]  Patient name: [name] Medical record number: [id number] Date of birth: 1931/06/20 Age: 84 y.o. Gender: female  Primary Care Provider: [name], MD Consultants: neuro, cards,  Code Status: Full code  Pt Overview and Major Events to Date:  Hospital Day 3 Admitted: 07/16/2018  Assessment and Plan: LUREE PALLA is a 84 y.o. female who presented with AMS and L side Upper and lower focal deficits, as well as sepsis and volume overload suggestive of new onset heart failure. Altered mental status and FND are now believed to be the result of a seizure. PMH is significant forencephalomalacia of the right parietal lobe status post craniotomyfor meningioma,seizure d/o, CAD (mild plaque in 03/2012 cath), frequent falls and hypertension, subdural hematoma, HLD  Bipolar/depression - patient not mentioning thoughts of depression or wanting to die today, . Overnight patient was wanting to leave AMA. Was stating paranoid beliefs about the staff.  Doesn't want to hurt herself but is stating she doesn't want to live in this world anymore.  States she is not suicidal though.   - restarted prozac.  - 24hr sitter. Reassess if she needs sitter.   Seizure causing AMS w/ L side deficit - resolved. patient was found down at home and brought to ED.  Initial labs showed wbc 16.2> 13.5, LA 3.25>1.0. CXR showed right lobar opacity. No evidence of hemorrhagic stroke on head CT. MRI brain showed increased diffusion abnormality in right temporal lobe suggesting seizure, inferior R cerebellum acute infarct, right parieto-occipital cranioplasty with underlying encephalomalacia. No evidence of large vessel occlusion on CTA. Neurology was consulted, and they suspect it may be todd's paralysis rather than stroke. Awaiting EEG to be performed.   - f/u EEG - - talk with neuro about need for continued stroke workup - keppra 500mg  BID.  Switching to oral   - holding all neuroactive medication: gabapentin, adderall - narrrowed Abx to doxycycline for CAP. (abx coverage started 12/6) - PT/OT - neuro checks - statin 40mg  daily  Sepsis - resolved.  today patient is alert and oriented with no physical complaints. Her vitals have been within normal limits . Physical exam normal. . CXR suggests R middle lobe opacity. Most likely source at this time is pneumonia though patient not endorsing cough or other respiratory signs.  Blood culture NG1D.  WBC 16.2> , LA 3.25>2.2> 1.0.  We will continue to treat empirically while we await cultures and trend LA and wb.  S.Pneumo, legionella negative.  - continue doxycycline - f/u cultures. NG1D  troponemia - 0.81 >0.89>0.87>1.16>0.81>0.67>0.61. BMP is 593.3.  Cardiology believes it is a noncardiac cause.  ECHO showed nml systolic function, M7EH, EF 60-65%. PA peak pressure 60mmHg. Cards was informally consulted over the weekend re: the patient's possible a-fib. They do not believe it was a-fib and ECHO does not show anything suggesting she need to be on anticoagulation. Patient complained of chest pain overnight and tropnonin was ordered and EKG.  - f/u troponin and ekg  Hypokalemia  - 2.6 overnight.  repleted 9mEq.  Rose to 3.2.  repleted 13meq.  - am BMP.   Acute hypercarbic resp failure - resolved. ABG has improved to 7.38, pco2 38.  pateint on room air now.   HTN - HCTZ 12.5 at home.  BP  171/79, SBP consistently in 160-170s.  - holding HCTZ in setting of low K.   - started amlodipine 5mg .   HLD - LDL 70.  Total  cholesterol 122.  - lipid panel f/u - atorvostatin 40mg    Chronic pain - holding gabapentin   Fluids: none   Electrolytes: potassium repleted  Nutrition: regular diet GI ppx: none DVT px: none  Disposition: snf vs home.    Medications: Scheduled Meds: . amLODipine  5 mg Oral Daily  . atorvastatin  40 mg Oral q1800  . doxycycline  100 mg Oral Q12H  .  FLUoxetine  20 mg Oral Daily  . levETIRAcetam  500 mg Oral BID   Continuous Infusions:  PRN Meds: acetaminophen **OR** acetaminophen (TYLENOL) oral liquid 160 mg/5 mL **OR** acetaminophen, hydrALAZINE, ibuprofen  ================================================= ================================================= Subjective:  Patient stating she has chest pain and points to the left side. , mid sternal. She states she talked to 'dr. Donnajean Lopes' last night and she was going to get ibuprofen because that's 'all she needs' but she didn't get it and her chest was hurting again at [place].  Patient states she was told she could not take care of herself at home by a doctor, but doesn't remember who.  Patient states she is worried about the sitter and the nurse taking drugs.  She also repeated what she had said on a previous day about how she likes to eat frozen bananas in the morning.   Objective: Temp:  [98.1 F (36.7 C)-98.6 F (37 C)] 98.1 F (36.7 C) (12/09 1612) Pulse Rate:  [51-74] 51 (12/09 1612) Resp:  [17-20] 19 (12/09 1612) BP: (136-175)/(62-82) 146/62 (12/09 1612) SpO2:  [96 %-98 %] 98 % (12/09 1612) Intake/Output 12/08 0701 - 12/09 0700 In: 78 [P.O.:960] Out: -  Physical Exam:  Gen: NAD, alert, non-toxic, sitting comfortably eating breakfast.    CV: tender to palpation on the left sternal border and left mid-axillary region. Regular rate and rhythm.  No arrythmia detected.  Normal capillary refill bilaterally.  Radial pulses 2+ bilaterally. No bilateral lower extremity edema. Resp: some inspiratory ronchi heard in right lower lung field.  No wheezing,  No increased work of breathing appreciated. Abd: Nontender and nondistended on palpation to all 4 quadrants.  Positive bowel sounds. Psych: patient very talkative, repeating herself several times.  She has paranoid thoughts about the staff.  Repeatedly states she would like to leave.  Extremities: Full ROM    Laboratory: Recent Labs   Lab 07/16/18 2358 07/17/18 0615 07/20/18 0511  WBC 16.2* 13.5* 9.3  HGB 12.6 12.1 12.1  HCT 40.7 38.2 38.0  PLT 286 284 263   Recent Labs  Lab 07/16/18 2207  07/16/18 2358  07/19/18 0025 07/19/18 0548 07/20/18 0511  NA  --    < > 135  134*   < > 137 137 137  K  --    < > 3.6  3.6   < > 2.6* 3.2* 4.5  CL  --    < > 102  100   < > 102 102 107  CO2  --   --  20*  21*   < > 25 25 19*  BUN  --    < > 18  18   < > 8 8 13   CREATININE  --    < > 0.95  0.92   < > 0.48 0.57 0.63  CALCIUM  --   --  8.9  8.8*   < > 8.4* 8.5* 8.6*  PROT 7.7  --  7.0  --   --   --   --   BILITOT 0.6  --  0.3  --   --   --   --  ALKPHOS 115  --  104  --   --   --   --   ALT 19  --  18  --   --   --   --   AST 36  --  32  --   --   --   --   GLUCOSE  --    < > 192*  192*   < > 105* 96 115*   < > = values in this interval not displayed.    Imaging/Diagnostic Tests: No results found.    Benay Pike, MD 07/20/2018, 5:12 PM PGY-1, [place] Intern pager: [phone], text pages welcome

## 2018-07-21 MED ORDER — AMLODIPINE BESYLATE 10 MG PO TABS
10.0000 mg | ORAL_TABLET | Freq: Every day | ORAL | Status: DC
  Administered 2018-07-22 – 2018-07-23 (×2): 10 mg via ORAL
  Filled 2018-07-21 (×2): qty 1

## 2018-07-21 MED ORDER — ISOSORBIDE MONONITRATE ER 30 MG PO TB24
15.0000 mg | ORAL_TABLET | Freq: Every day | ORAL | Status: DC
  Administered 2018-07-23: 15 mg via ORAL
  Filled 2018-07-21 (×2): qty 1

## 2018-07-21 MED ORDER — ASPIRIN EC 81 MG PO TBEC
81.0000 mg | DELAYED_RELEASE_TABLET | Freq: Every day | ORAL | Status: DC
  Administered 2018-07-21 – 2018-07-23 (×3): 81 mg via ORAL
  Filled 2018-07-21 (×3): qty 1

## 2018-07-21 MED ORDER — AMLODIPINE BESYLATE 5 MG PO TABS: 5.0000 mg | ORAL_TABLET | Freq: Once | ORAL | Status: DC

## 2018-07-21 NOTE — Progress Notes (Addendum)
Coronary CTA showed mildly dilated ascending aorta at 3.7cm, 1-24% minimal plaque in prox to mid RCA, 1-24% prox to mid LAD, 50-69% D2 and diffuse non obstructing calcified plaque in the prox to mid LCx.  There is a small PFO.  Coronary Ca score is high at 641.  FFR showed normal flow in prox to mid LAD but reduced in distal LAD which is a very small vessel.  Medical management recommended.  Start ASA 81mg  daily, Atorvastatin 40mg  daily and add Imdur 15mg  daily.  Continue amlodipine 10mg  daily.  No BB due to resting bradycardia. Recommend outpt sleep study and PFTs to sort out pulmonary HTN. Will order VQ scan while in hospital to rule out chronic PEs

## 2018-07-21 NOTE — Progress Notes (Addendum)
Family Medicine Teaching Service Daily Progress Note Intern Pager: [phone]  Patient name: [name] Medical record number: [id number] Date of birth: 07/22/1931 Age: 84 y.o. Gender: female  Primary Care Provider: [name], MD Consultants: neuro, cards,  Code Status: Full code  Pt Overview and Major Events to Date:  Hospital Day 4 Admitted: 07/16/2018  Assessment and Plan: [name] is a 84 y.o. female who presented with AMS and L side Upper and lower focal deficits, as well as sepsis and volume overload suggestive of new onset heart failure. Altered mental status and FND are now believed to be the result of a seizure. PMH is significant forencephalomalacia of the right parietal lobe status post craniotomyfor meningioma,seizure d/o, CAD (mild plaque in 03/2012 cath), frequent falls and hypertension, subdural hematoma, HLD   Bipolar/depression - patient mood is improved.  States she just gets depressed when things get overwhelming.  Doesn't want to hurt herself but is stating she doesn't want to live in this world anymore.  States she is not suicidal though.  sitter was d/c'd.  - continuing prozac.   Disposition issues -  - does not want SNF, would like home health PT/OT.  - needs help getting a ride if she can't drive herself.  - would like help getting her records.    Seizure causing AMS w/ L side deficit - resolved. patient was found down at home and brought to ED.  Initial labs showed wbc 16.2> 13.5, LA 3.25>1.0. CXR showed right lobar opacity. No evidence of hemorrhagic stroke on head CT. MRI brain showed increased diffusion abnormality in right temporal lobe suggesting seizure, inferior R cerebellum acute infarct, right parieto-occipital cranioplasty with underlying encephalomalacia. No evidence of large vessel occlusion on CTA. Neurology was consulted, and they suspect it may be todd's paralysis rather than stroke. EEG did not show new seizure activity.  Neurology does not think she needs any more workup for stroke.   - keppra 500mg  BID. Switchied to oral  - holding all neuroactive medication: gabapentin, adderall - narrrowed Abx to doxycycline for CAP. (abx coverage started 12/6 - 12/12) - PT/OT - neuro checks - statin 40mg  daily  Sepsis - resolved.  today patient is alert and oriented with no physical complaints. Her vitals have been within normal limits . Physical exam normal. . CXR suggests R middle lobe opacity. continueing to treat her with doxycycline until the 12th.  WBC 16.2> , LA 3.25>2.2> 1.0.  S.Pneumo, legionella negative.  - continue doxycycline - f/u cultures. NG3D  troponemia - 0.81 >0.89>0.87>1.16>0.81>0.67>0.61>.16 . BMP is 593.3.  Cardiology believes it is a noncardiac cause.  ECHO showed nml systolic function, Y6RS, EF 60-65%. PA peak pressure 52mmHg. Cards was informally consulted over the weekend re: the patient's possible a-fib. They do not believe it was a-fib and ECHO does not show anything suggesting she need to be on anticoagulation. CAC was ordered and was 641. Cards sent off for CTA FFR of LAD to decide if further workup is necessary.  HTN - HCTZ 12.5 at home.  BP  171/79, SBP consistently in 160-170s.  - holding HCTZ in setting of low K.   - started amlodipine 5mg .   HLD - LDL 70.  Total cholesterol 122.  - lipid panel f/u - atorvostatin 40mg    Hypokalemia  - resolved - am BMP.   Acute hypercarbic resp failure - resolved. ABG has improved to 7.38, pco2 38.  pateint on room air now.   Chronic pain - holding  gabapentin   Fluids: none   Electrolytes: potassium repleted  Nutrition: regular diet GI ppx: none DVT px: none  Disposition: home    Medications: Scheduled Meds: . amLODipine  5 mg Oral Daily  . atorvastatin  40 mg Oral q1800  . doxycycline  100 mg Oral Q12H  . FLUoxetine  20 mg Oral Daily  . levETIRAcetam  500 mg Oral BID   Continuous Infusions:  PRN Meds: acetaminophen **OR**  acetaminophen (TYLENOL) oral liquid 160 mg/5 mL **OR** acetaminophen, hydrALAZINE, ibuprofen  ================================================= ================================================= Subjective:  Patient is still having chest pain in the same area as before. Patient states she is doing well and doen'think she needs to be here.  She states she is not suicidal dn just gets depressed when she gets overwhelmed.  She states she has feelings of unworthiness from her childhood and now she volunteers because it makes her feel better about herself.  She has done a lot of volunteer work over the years.  She states she unddrstands she can't drive for 6 months but her options are limited for getting around otherwise.  She would like her records so she can look over them.     Objective: Temp:  [98.1 F (36.7 C)-98.7 F (37.1 C)] 98.3 F (36.8 C) (12/10 0328) Pulse Rate:  [51-66] 57 (12/10 0328) Resp:  [17-20] 18 (12/10 0328) BP: (136-175)/(56-82) 160/67 (12/10 0328) SpO2:  [96 %-98 %] 97 % (12/10 0328) Intake/Output 12/09 0701 - 12/10 0700 In: 480 [P.O.:480] Out: -  Physical Exam:  Gen: NAD, alert, non-toxic, sitting comfortably eating breakfast.    CV: tender to palpation over the left scapula.  Regular rate and rhythm.  No arrythmia detected.  Normal capillary refill bilaterally.  Radial pulses 2+ bilaterally. No bilateral lower extremity edema. Resp: lungs clear to auscultation bilaterally. No increased work of breathing appreciated. Abd: Nontender and nondistended on palpation to all 4 quadrants.  Positive bowel sounds. Psych: patient states she is not suicidal.  She is very talkative today.  Extremities: Full ROM    Laboratory: Recent Labs  Lab 07/16/18 2358 07/17/18 0615 07/20/18 0511  WBC 16.2* 13.5* 9.3  HGB 12.6 12.1 12.1  HCT 40.7 38.2 38.0  PLT 286 284 263   Recent Labs  Lab 07/16/18 2207  07/16/18 2358  07/19/18 0025 07/19/18 0548 07/20/18 0511  NA  --    < >  135  134*   < > 137 137 137  K  --    < > 3.6  3.6   < > 2.6* 3.2* 4.5  CL  --    < > 102  100   < > 102 102 107  CO2  --   --  20*  21*   < > 25 25 19*  BUN  --    < > 18  18   < > 8 8 13   CREATININE  --    < > 0.95  0.92   < > 0.48 0.57 0.63  CALCIUM  --   --  8.9  8.8*   < > 8.4* 8.5* 8.6*  PROT 7.7  --  7.0  --   --   --   --   BILITOT 0.6  --  0.3  --   --   --   --   ALKPHOS 115  --  104  --   --   --   --   ALT 19  --  18  --   --   --   --  AST 36  --  32  --   --   --   --   GLUCOSE  --    < > 192*  192*   < > 105* 96 115*   < > = values in this interval not displayed.    Imaging/Diagnostic Tests: Ct Coronary Morph W/cta Cor W/score W/ca W/cm &/or Wo/cm  Addendum Date: 07/20/2018   ADDENDUM REPORT: 07/20/2018 18:53 CLINICAL DATA:  37F with hypertension, hyperlipidemia, chest pain and positive troponin. EXAM: Cardiac/Coronary  CT TECHNIQUE: The patient was scanned on a [employer]. FINDINGS: A 120 kV prospective scan was triggered in the descending thoracic aorta at 111 HU's. Axial non-contrast 3 mm slices were carried out through the heart. The data set was analyzed on a dedicated work station and scored using the [place]. Gantry rotation speed was 250 msecs and collimation was .6 mm. No beta blockade and 0.8 mg of sl NTG was given. The 3D data set was reconstructed in 5% intervals of the 67-82 % of the R-R cycle. Diastolic phases were analyzed on a dedicated work station using MPR, MIP and VRT modes. The patient received 80 cc of contrast. Aorta: Ascending aorta mildly dilated (3.7cm). Mild calcification of the ascending aorta and aortic root. No dissection. Aortic Valve:  Trileaflet.  No calcifications. Coronary Arteries:  Normal coronary origin.  Right dominance. RCA is a large dominant artery that gives rise to PDA and PLVB. There is minimal (1-24%) calcified plaque in the proximal and mid RCA. Left main is a large artery that gives rise to LAD and LCX  arteries. LAD is a large vessel that gives rise to two diagonal branches. There is minimal (1-24%) calcified plaque in the proximal to mid LAD. There is moderate (50-69%) calcified plaque in proximal D2. This may be overestimated due to blooming artifact. There is minimal calcified plaque in D1. LCX is a non-dominant artery that gives rise to one large OM1 branch. There is diffuse, non-obstructing calcified plaque in proximal to mid LCX. Other findings: Normal pulmonary vein drainage into the left atrium. Normal let atrial appendage without a thrombus. Normal size of the pulmonary artery. Small PFO noted. IMPRESSION: 1. Coronary calcium score of 641. Age and sex matched comparison is only validated through age 65. Assuming age 64, this was 79th percentile for age and sex matched control. 2. Normal coronary origin with right dominance. 3. Diffuse calcified plaque with possible obstructive disease in the LAD territory. 4.  Will send study for FFRct. Skeet Latch, MD Electronically Signed   By: Skeet Latch   On: 07/20/2018 18:53   Result Date: 07/20/2018 EXAM: OVER-READ INTERPRETATION  CT CHEST The following report is an over-read performed by radiologist Dr. Vinnie Langton of [facility], [place] on 07/20/2018. This over-read does not include interpretation of cardiac or coronary anatomy or pathology. The coronary calcium score/coronary CTA interpretation by the cardiologist is attached. COMPARISON:  None. FINDINGS: Aortic atherosclerosis. Moderate-sized hiatal hernia. Mild linear scarring in the lung bases bilaterally. Within the visualized portions of the thorax there are no suspicious appearing pulmonary nodules or masses, there is no acute consolidative airspace disease, no pleural effusions, no pneumothorax and no lymphadenopathy. Visualized portions of the upper abdomen are unremarkable. There are no aggressive appearing lytic or blastic lesions noted in the visualized portions of the skeleton.  IMPRESSION: 1.  Aortic Atherosclerosis (ICD10-I70.0). 2. Moderate-sized hiatal hernia. Electronically Signed: By: Vinnie Langton M.D. On: 07/20/2018 18:17      Jeannine Kitten,  Garen Lah, MD 07/21/2018, 5:48 AM PGY-1, [place] Intern pager: [phone], text pages welcome

## 2018-07-21 NOTE — Progress Notes (Signed)
CSW acknowledging recommendation for SNF placement per PT. However, per chart review, patient refusing SNF at this time, wants to go home with home health. CSW attempted to meet with patient to confirm, but she was asleep and would not wake after calling her name several times.   CSW signing off, as patient not interested in SNF placement. Please consult CSW if patient becomes agreeable.  Laveda Abbe, [place] Clinical Social Worker [phone]

## 2018-07-21 NOTE — Progress Notes (Signed)
Awaiting coronary CTA FFR of LAD results to determine further workup of elevated troponin.  She had high calcium score so needs aggressive risk factor modification.  Start ASA 81mg  daily and continue statin. LDL 70 this admission.

## 2018-07-21 NOTE — Care Management Note (Addendum)
Case Management Note  Patient Details  Name: [name] MRN: [id number] Date of Birth: 16-Oct-1930  Subjective/Objective:     Pt admitted with focal neurological deficit. She is from home alone.  DME: cane, walker, shower chair Denies issues obtaining her medications. Pt wont be able to drive for at least 6 months post d/c. Pt states her neighbor [name] can assist with transportation.               Action/Plan: Recommendations are for SNF. Pt is adamantely refusing. She wants to d/c home with her dogs. She states her neighbor [name] can check on her at home. CM inquired about 24 hour supervision. Pt does not have this at home. She states she doesn't need it and her neighbors can check on her as needed.  CM inquired about [facility] services. She is open to this. CM provided her choice and she selected [facility]. CM following for [place] orders and F2F.  CM following.  Addendum: (1635): HH orders placed. CM called and notified [name] with [facility]. She accepted the referral. Pt has orders for tub bench. Pt has shower seat at home. Pt has transportation home when ready for d/c.   Expected Discharge Date:  07/19/18               Expected Discharge Plan:  Skilled Nursing Facility  In-House Referral:  Clinical Social Work  Discharge planning Services  CM Consult  Post Acute Care Choice:  Home Health Choice offered to:  Patient  DME Arranged:    DME Agency:     HH Arranged:    [place] Agency:  [facility]  Status of Service:  In process, will continue to follow  If discussed at Long Length of Stay Meetings, dates discussed:    Additional Comments:  Donna Friar, RN 07/21/2018, 3:19 PM

## 2018-07-22 ENCOUNTER — Inpatient Hospital Stay (HOSPITAL_COMMUNITY): Payer: Medicare Other

## 2018-07-22 ENCOUNTER — Other Ambulatory Visit: Payer: Self-pay | Admitting: Medical

## 2018-07-22 ENCOUNTER — Other Ambulatory Visit: Payer: Self-pay | Admitting: Family Medicine

## 2018-07-22 ENCOUNTER — Telehealth: Payer: Self-pay | Admitting: Internal Medicine

## 2018-07-22 DIAGNOSIS — I272 Pulmonary hypertension, unspecified: Secondary | ICD-10-CM

## 2018-07-22 DIAGNOSIS — J189 Pneumonia, unspecified organism: Secondary | ICD-10-CM

## 2018-07-22 DIAGNOSIS — G8384 Todd's paralysis (postepileptic): Secondary | ICD-10-CM | POA: Diagnosis present

## 2018-07-22 LAB — BASIC METABOLIC PANEL
Anion gap: 8 (ref 5–15)
BUN: 20 mg/dL (ref 8–23)
CO2: 22 mmol/L (ref 22–32)
CREATININE: 0.75 mg/dL (ref 0.44–1.00)
Calcium: 8.6 mg/dL — ABNORMAL LOW (ref 8.9–10.3)
Chloride: 109 mmol/L (ref 98–111)
GFR calc Af Amer: >60 mL/min (ref >60)
Glucose, Bld: 100 mg/dL — ABNORMAL HIGH (ref 70–99)
Potassium: 4 mmol/L (ref 3.5–5.1)
Sodium: 139 mmol/L (ref 135–145)

## 2018-07-22 LAB — CBC WITH DIFFERENTIAL/PLATELET
Abs Immature Granulocytes: 0.07 10*3/uL (ref 0.00–0.07)
Basophils Absolute: 0.1 10*3/uL (ref 0.0–0.1)
Basophils Relative: 1 %
Eosinophils Absolute: 0.2 10*3/uL (ref 0.0–0.5)
Eosinophils Relative: 2 %
HCT: 38.1 % (ref 36.0–46.0)
Hemoglobin: 12.3 g/dL (ref 12.0–15.0)
Immature Granulocytes: 1 %
LYMPHS PCT: 25 %
Lymphs Abs: 2.3 10*3/uL (ref 0.7–4.0)
MCH: 28.5 pg (ref 26.0–34.0)
MCHC: 32.3 g/dL (ref 30.0–36.0)
MCV: 88.2 fL (ref 80.0–100.0)
MONOS PCT: 11 %
Monocytes Absolute: 1 10*3/uL (ref 0.1–1.0)
Neutro Abs: 5.5 10*3/uL (ref 1.7–7.7)
Neutrophils Relative %: 60 %
Platelets: 271 10*3/uL (ref 150–400)
RBC: 4.32 MIL/uL (ref 3.87–5.11)
RDW: 13.8 % (ref 11.5–15.5)
WBC: 9.1 10*3/uL (ref 4.0–10.5)
nRBC: 0 % (ref 0.0–0.2)

## 2018-07-22 LAB — CULTURE, BLOOD (ROUTINE X 2)
CULTURE: NO GROWTH
Culture: NO GROWTH
Special Requests: ADEQUATE

## 2018-07-22 MED ORDER — LEVETIRACETAM 500 MG PO TABS: 500.0000 mg | ORAL_TABLET | Freq: Two times a day (BID) | ORAL | 0 refills | Status: DC

## 2018-07-22 MED ORDER — IBUPROFEN 200 MG PO TABS
600.0000 mg | ORAL_TABLET | Freq: Four times a day (QID) | ORAL | Status: DC | PRN
  Administered 2018-07-22 – 2018-07-23 (×3): 600 mg via ORAL
  Filled 2018-07-22 (×3): qty 3

## 2018-07-22 MED ORDER — TECHNETIUM TC 99M DIETHYLENETRIAME-PENTAACETIC ACID
30.0000 | Freq: Once | INTRAVENOUS | Status: AC | PRN
  Administered 2018-07-22: 30 via RESPIRATORY_TRACT

## 2018-07-22 MED ORDER — TECHNETIUM TO 99M ALBUMIN AGGREGATED
4.0000 | Freq: Once | INTRAVENOUS | Status: AC | PRN
  Administered 2018-07-22: 4 via INTRAVENOUS

## 2018-07-22 MED ORDER — ISOSORBIDE MONONITRATE ER 30 MG PO TB24: 15.0000 mg | ORAL_TABLET | Freq: Every day | ORAL | 0 refills | Status: DC

## 2018-07-22 MED ORDER — ATORVASTATIN CALCIUM 40 MG PO TABS: 40.0000 mg | ORAL_TABLET | Freq: Every day | ORAL | 0 refills | Status: DC

## 2018-07-22 MED ORDER — AMLODIPINE BESYLATE 10 MG PO TABS: 10.0000 mg | ORAL_TABLET | Freq: Every day | ORAL | 0 refills | Status: DC

## 2018-07-22 MED ORDER — ASPIRIN 81 MG PO TBEC: 81.0000 mg | DELAYED_RELEASE_TABLET | Freq: Every day | ORAL | 0 refills | Status: AC

## 2018-07-22 NOTE — Progress Notes (Signed)
Physical Therapy Treatment Patient Details Name: [name] MRN: [id number] DOB: 31-Jan-1931 Today's Date: 07/22/2018    History of Present Illness Pt is an 84 y/o female presenting to ED with AMS and left sided deficits. MRI of the brain shows diffusion abnormality involving the right mesial temporal lobe and hippocampus, and punctate focus of stroke in the right cerebellum suggestive of acute or subacute infarct. Pt with PMH including meningioma status post craniotomy on the right side, subdural hemorrhage with resultant right parietal encephalomalacia Anemia, Asthma, AVM, Cancer, Coronary artery disease, Depression, GI bleed, Hiatal hernia, HLD, HTN, Low back pain, OA, Obesity, Obesity, Ovarian cyst, Right wrist injury, Rotator cuff arthropathy, Seizures (1980s), and Tuberculosis.     PT Comments    Pt making progress with functional mobility; however, remains unsteady with ambulation requiring intermittent min A and UE support for balance. Pt would continue to benefit from skilled physical therapy services at this time while admitted and after d/c to address the below listed limitations in order to improve overall safety and independence with functional mobility.    Follow Up Recommendations  SNF;Other (comment)(if pt refuses, will need 24/7 supervision/A and HHPT)     Equipment Recommendations  None recommended by PT    Recommendations for Other Services       Precautions / Restrictions Precautions Precautions: Fall Restrictions Weight Bearing Restrictions: No    Mobility  Bed Mobility Overal bed mobility: Needs Assistance Bed Mobility: Supine to Sit     Supine to sit: Supervision     General bed mobility comments: supervision for safety  Transfers Overall transfer level: Needs assistance Equipment used: None Transfers: Sit to/from Stand Sit to Stand: Min guard         General transfer comment: min guard for safety, good  technique  Ambulation/Gait Ambulation/Gait assistance: Min assist;Min guard Gait Distance (Feet): 125 Feet Assistive device: 1 person hand held assist;None(pt frequently reaching for support surfaces) Gait Pattern/deviations: Step-through pattern;Decreased stride length;Trunk flexed;Drifts right/left Gait velocity: decreased   General Gait Details: pt with modest instability requiring intermittent min A and UE support for balance   Stairs             Wheelchair Mobility    Modified Rankin (Stroke Patients Only) Modified Rankin (Stroke Patients Only) Pre-Morbid Rankin Score: Slight disability Modified Rankin: Moderately severe disability     Balance Overall balance assessment: Needs assistance Sitting-balance support: No upper extremity supported;Feet supported Sitting balance-Leahy Scale: Fair     Standing balance support: Single extremity supported;During functional activity Standing balance-Leahy Scale: Poor                              Cognition Arousal/Alertness: Awake/alert Behavior During Therapy: WFL for tasks assessed/performed Overall Cognitive Status: No family/caregiver present to determine baseline cognitive functioning Area of Impairment: Safety/judgement;Problem solving                         Safety/Judgement: Decreased awareness of deficits;Decreased awareness of safety   Problem Solving: Difficulty sequencing;Requires verbal cues        Exercises      General Comments        Pertinent Vitals/Pain Pain Assessment: Faces Faces Pain Scale: Hurts even more Pain Location: L chest/musculoskeletal Pain Descriptors / Indicators: Sore Pain Intervention(s): Monitored during session;Repositioned;Patient requesting pain meds-RN notified    Home Living  Prior Function            PT Goals (current goals can now be found in the care plan section) Acute Rehab PT Goals PT Goal Formulation:  With patient Time For Goal Achievement: 08/03/18 Potential to Achieve Goals: Good Progress towards PT goals: Progressing toward goals    Frequency    Min 2X/week      PT Plan Current plan remains appropriate    Co-evaluation              AM-PAC PT "6 Clicks" Mobility   Outcome Measure  Help needed turning from your back to your side while in a flat bed without using bedrails?: A Little Help needed moving from lying on your back to sitting on the side of a flat bed without using bedrails?: A Little Help needed moving to and from a bed to a chair (including a wheelchair)?: A Little Help needed standing up from a chair using your arms (e.g., wheelchair or bedside chair)?: A Little Help needed to walk in hospital room?: A Little Help needed climbing 3-5 steps with a railing? : A Lot 6 Click Score: 17    End of Session Equipment Utilized During Treatment: Gait belt Activity Tolerance: Patient tolerated treatment well Patient left: in bed;with call bell/phone within reach;with bed alarm set;Other (comment)(pt sitting EOB to eat her breakfast) Nurse Communication: Mobility status PT Visit Diagnosis: Unsteadiness on feet (R26.81);Other abnormalities of gait and mobility (R26.89);Muscle weakness (generalized) (M62.81)     Time: 8185-6314 PT Time Calculation (min) (ACUTE ONLY): 22 min  Charges:  $Gait Training: 8-22 mins                     Sherie Don, [place], DPT  Acute Rehabilitation Services Pager [phone] Office [place] 07/22/2018, 11:30 AM

## 2018-07-22 NOTE — Progress Notes (Signed)
   Patient ordered for a VQ scan to evaluate for chronic PE's. Management of results per primary team.   [facility] will sign off.   Medication Recommendations:  Continue aspirin 81mg  daily, atorvastatin 40mg  daily, amlodipine 10mg  daily, and imdur 15 mg daily.  Other recommendations (labs, testing, etc): She has been ordered for outpatient pulmonary function testing and a sleep study. Our office will contact her directly with appointment dates/times Follow up as an outpatient:  Follow-up with Rosaria Ferries, PA-C has been arranged and discharge AVS updated.

## 2018-07-22 NOTE — Progress Notes (Signed)
Pt provided information on transportation in case her neighbor is not able to assist her at times.

## 2018-07-22 NOTE — Discharge Instructions (Signed)
We have started you on some new medications.  Please take them as directed.  They include:  -keppra 500mg  twice a day.  This is to prevent seizures - aspirin 81mg  daily, to prevent strokes - IMDUR 15mg  once a day.  This is for your heart - amlodipine 10mg  once a day.  This is for your blood pressure.   Please do not take your adderall or gabapentin until you have discussed it with Dr. Andria Frames at your followup appointment.   Since you had a seizure, it is recommended that you do not drive until you have been seizure-free for six months at least.    Seizure, Adult When you have a seizure:  Parts of your body may move.  How aware or awake (conscious) you are may change.  You may shake (convulse).  Some people have symptoms right before a seizure happens. These symptoms may include:  Fear.  Worry (anxiety).  Feeling like you are going to throw up (nausea).  Feeling like the room is spinning (vertigo).  Feeling like you saw or heard something before (deja vu).  Odd tastes or smells.  Changes in vision, such as seeing flashing lights or spots.  Seizures usually last from 30 seconds to 2 minutes. Usually, they are not harmful unless they last a long time. Follow these instructions at home: Medicines  Take over-the-counter and prescription medicines only as told by your doctor.  Avoid anything that may keep your medicine from working, such as alcohol. Activity  Do not do any activities that would be dangerous if you had another seizure, like driving or swimming. Wait until your doctor approves.  If you live in the [place] ask your local DMV (department of motor vehicles) when you can drive.  Rest. Teaching others  Teach friends and family what to do when you have a seizure. They should: ? Lay you on the ground. ? Protect your head and body. ? Loosen any tight clothing around your neck. ? Turn you on your side. ? Stay with you until you are better. ? Not hold you  down. ? Not put anything in your mouth. ? Know whether or not you need emergency care. General instructions  Contact your doctor each time you have a seizure.  Avoid anything that gives you seizures.  Keep a seizure diary. Write down: ? What you think caused each seizure. ? What you remember about each seizure.  Keep all follow-up visits as told by your doctor. This is important. Contact a doctor if:  You have another seizure.  You have seizures more often.  There is any change in what happens during your seizures.  You continue to have seizures with treatment.  You have symptoms of being sick or having an infection. Get help right away if:  You have a seizure: ? That lasts longer than 5 minutes. ? That is different than seizures you had before. ? That makes it harder to breathe. ? After you hurt your head.  After a seizure, you cannot speak or use a part of your body.  After a seizure, you are confused or have a bad headache.  You have two or more seizures in a row.  You are having seizures more often.  You do not wake up right after a seizure.  You get hurt during a seizure. In an emergency:  These symptoms may be an emergency. Do not wait to see if the symptoms will go away. Get medical help right away. Call your  local emergency services (911 in the [place] Do not drive yourself to the hospital. This information is not intended to replace advice given to you by your health care provider. Make sure you discuss any questions you have with your health care provider. Document Released: 01/15/2008 Document Revised: 04/10/2016 Document Reviewed: 04/10/2016 Elsevier Interactive Patient Education  2017 [employer].

## 2018-07-22 NOTE — Progress Notes (Signed)
Family Medicine Teaching Service Daily Progress Note Intern Pager: [phone]  Patient name: [name] Medical record number: [id number] Date of birth: Jan 16, 1931 Age: 84 y.o. Gender: female  Primary Care Provider: [name], MD Consultants: neuro, cards,  Code Status: Full code  Pt Overview and Major Events to Date:  Hospital Day 5 Admitted: 07/16/2018  Assessment and Plan: [name] is a 84 y.o. female who presented with AMS and L side Upper and lower focal deficits, as well as sepsis and volume overload suggestive of new onset heart failure. Altered mental status and FND are now believed to be the result of a seizure. PMH is significant forencephalomalacia of the right parietal lobe status post craniotomyfor meningioma,seizure d/o, CAD (mild plaque in 03/2012 cath), frequent falls and hypertension, subdural hematoma, HLD  Bipolar/depression - patient mood is improved today. She did not mention anything about being depressed or sad.   Doesn't want to hurt herself but is stating she doesn't want to live in this world anymore.  States she is not suicidal though.  patient no longer has suicide sitter at bedside. Patient does not want to talk with chaplain because she states she is not religious.   - continuing prozac.   Disposition issues -  - does not want SNF, would like home health PT/OT.  - needs help getting a ride if she can't drive herself. Consult case mgmt - would like help getting her records.    Seizure causing AMS w/ L side deficit - resolved. patient was found down at home and brought to ED.   No evidence of hemorrhagic stroke on head CT. MRI brain showed increased diffusion abnormality in right temporal lobe suggesting seizure, inferior R cerebellum acute infarct, right parieto-occipital cranioplasty with underlying encephalomalacia. No evidence of large vessel occlusion on CTA. 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 EEG did not show new seizure activity. Neurology does not think she needs any more workup for stroke after evaluating patient's MRI.   - keppra 500mg  BID. Switchied to oral. Will need this going home.   - holding all neuroactive medication: gabapentin, adderall - narrrowed Abx to doxycycline for CAP. (abx coverage started 12/6 - 12/12) - PT/OT - neuro checks - statin 40mg  daily  Sepsis - resolved.  Initial labs showed wbc 16.2> 13.5, LA 3.25>1.0. CXR showed right lobar opacity.today patient is feeling well..  Her vitals have been within normal limits . Physical exam normal. . CXR suggests R middle lobe opacity. continuing to treat her with doxycycline until the 12th.  WBC 16.2>9.3 , LA 3.25>2.2> 1.0.  S.Pneumo, legionella negative.  - continue doxycycline - f/u cultures. NG4D  troponemia - 0.81 >0.89>0.87>1.16>0.81>0.67>0.61>.16 . BMP is 593.3.  Cardiology believes it is a noncardiac cause.  ECHO showed nml systolic function, V0JJ, EF 60-65%. PA peak pressure 89mmHg. Cards was informally consulted over the weekend re: the patient's possible a-fib. They do not believe it was a-fib and ECHO does not show anything suggesting she need to be on anticoagulation. CAC was ordered and was 641. Cards sent off for CTA FFR of LAD and based on results recommended starting ASA, Imdur,, getting PFTs outpatient.   - VQ scan to rule out chronioc PE - f/u cardiology read of CTA FFR.  - V/Q scan today  HTN - HCTZ 12.5 at home.  BP  171/79, SBP consistently in 160-170s.  - holding HCTZ in setting of low K.   - increased  amlodipine to 10mg   HLD - LDL 70.  Total cholesterol 122.  - atorvostatin 40mg    Hypokalemia  - resolved - am BMP.   Acute hypercarbic resp failure - resolved. ABG has improved to 7.38, pco2 38.  pateint on room air now.   Chronic pain - holding gabapentin   Fluids: none   Electrolytes: potassium repleted  Nutrition: regular diet GI ppx: none DVT px:  none  Disposition: home    Medications: Scheduled Meds: . amLODipine  10 mg Oral Daily  . amLODipine  5 mg Oral Once  . aspirin EC  81 mg Oral Daily  . atorvastatin  40 mg Oral q1800  . doxycycline  100 mg Oral Q12H  . FLUoxetine  20 mg Oral Daily  . isosorbide mononitrate  15 mg Oral Daily  . levETIRAcetam  500 mg Oral BID   Continuous Infusions:  PRN Meds: acetaminophen **OR** acetaminophen (TYLENOL) oral liquid 160 mg/5 mL **OR** acetaminophen, hydrALAZINE  ================================================= ================================================= Subjective:  Patient remembered my name this morning.  She would like to go home and see her dogs.  She is still having pain in her left chest awall and left mid axillary region.  She states no one told her she whad pneumonia.  She takes doxycycline low dose at home for her rosacea.    Objective: Temp:  [97.9 F (36.6 C)-98.5 F (36.9 C)] 98.4 F (36.9 C) (12/11 0429) Pulse Rate:  [54-64] 64 (12/11 0429) Resp:  [16-18] 18 (12/11 0429) BP: (139-156)/(56-72) 147/67 (12/11 0429) SpO2:  [97 %-99 %] 98 % (12/11 0429) Intake/Output No intake/output data recorded. Physical Exam:  Gen: NAD, alert, non-toxic, sitting comfortably    CV: tender to palpation over the left scapula.  Regular rate and rhythm.  No arrythmia detected.  Normal capillary refill bilaterally.  Radial pulses 2+ bilaterally. No bilateral lower extremity edema. Resp: lungs clear to auscultation bilaterally. No increased work of breathing appreciated. Abd: Nontender and nondistended on palpation to all 4 quadrants.  Positive bowel sounds. Psych: patient affect improved today.  Extremities: Full ROM    Laboratory: Recent Labs  Lab 07/16/18 2358 07/17/18 0615 07/20/18 0511  WBC 16.2* 13.5* 9.3  HGB 12.6 12.1 12.1  HCT 40.7 38.2 38.0  PLT 286 284 263   Recent Labs  Lab 07/16/18 2207  07/16/18 2358  07/19/18 0025 07/19/18 0548 07/20/18 0511  NA   --    < > 135  134*   < > 137 137 137  K  --    < > 3.6  3.6   < > 2.6* 3.2* 4.5  CL  --    < > 102  100   < > 102 102 107  CO2  --   --  20*  21*   < > 25 25 19*  BUN  --    < > 18  18   < > 8 8 13   CREATININE  --    < > 0.95  0.92   < > 0.48 0.57 0.63  CALCIUM  --   --  8.9  8.8*   < > 8.4* 8.5* 8.6*  PROT 7.7  --  7.0  --   --   --   --   BILITOT 0.6  --  0.3  --   --   --   --   ALKPHOS 115  --  104  --   --   --   --   ALT 19  --  18  --   --   --   --   AST 36  --  32  --   --   --   --   GLUCOSE  --    < > 192*  192*   < > 105* 96 115*   < > = values in this interval not displayed.    Imaging/Diagnostic Tests: Ct Coronary Morph W/cta Cor W/score W/ca W/cm &/or Wo/cm  Addendum Date: 07/20/2018   ADDENDUM REPORT: 07/20/2018 18:53 CLINICAL DATA:  67F with hypertension, hyperlipidemia, chest pain and positive troponin. EXAM: Cardiac/Coronary  CT TECHNIQUE: The patient was scanned on a [employer]. FINDINGS: A 120 kV prospective scan was triggered in the descending thoracic aorta at 111 HU's. Axial non-contrast 3 mm slices were carried out through the heart. The data set was analyzed on a dedicated work station and scored using the [place]. Gantry rotation speed was 250 msecs and collimation was .6 mm. No beta blockade and 0.8 mg of sl NTG was given. The 3D data set was reconstructed in 5% intervals of the 67-82 % of the R-R cycle. Diastolic phases were analyzed on a dedicated work station using MPR, MIP and VRT modes. The patient received 80 cc of contrast. Aorta: Ascending aorta mildly dilated (3.7cm). Mild calcification of the ascending aorta and aortic root. No dissection. Aortic Valve:  Trileaflet.  No calcifications. Coronary Arteries:  Normal coronary origin.  Right dominance. RCA is a large dominant artery that gives rise to PDA and PLVB. There is minimal (1-24%) calcified plaque in the proximal and mid RCA. Left main is a large artery that gives rise to LAD and  LCX arteries. LAD is a large vessel that gives rise to two diagonal branches. There is minimal (1-24%) calcified plaque in the proximal to mid LAD. There is moderate (50-69%) calcified plaque in proximal D2. This may be overestimated due to blooming artifact. There is minimal calcified plaque in D1. LCX is a non-dominant artery that gives rise to one large OM1 branch. There is diffuse, non-obstructing calcified plaque in proximal to mid LCX. Other findings: Normal pulmonary vein drainage into the left atrium. Normal let atrial appendage without a thrombus. Normal size of the pulmonary artery. Small PFO noted. IMPRESSION: 1. Coronary calcium score of 641. Age and sex matched comparison is only validated through age 4. Assuming age 40, this was 79th percentile for age and sex matched control. 2. Normal coronary origin with right dominance. 3. Diffuse calcified plaque with possible obstructive disease in the LAD territory. 4.  Will send study for FFRct. Skeet Latch, MD Electronically Signed   By: Skeet Latch   On: 07/20/2018 18:53   Result Date: 07/20/2018 EXAM: OVER-READ INTERPRETATION  CT CHEST The following report is an over-read performed by radiologist Dr. Vinnie Langton of [facility], [place] on 07/20/2018. This over-read does not include interpretation of cardiac or coronary anatomy or pathology. The coronary calcium score/coronary CTA interpretation by the cardiologist is attached. COMPARISON:  None. FINDINGS: Aortic atherosclerosis. Moderate-sized hiatal hernia. Mild linear scarring in the lung bases bilaterally. Within the visualized portions of the thorax there are no suspicious appearing pulmonary nodules or masses, there is no acute consolidative airspace disease, no pleural effusions, no pneumothorax and no lymphadenopathy. Visualized portions of the upper abdomen are unremarkable. There are no aggressive appearing lytic or blastic lesions noted in the visualized portions of the  skeleton. IMPRESSION: 1.  Aortic Atherosclerosis (ICD10-I70.0). 2. Moderate-sized hiatal hernia. Electronically Signed:  By: Vinnie Langton M.D. On: 07/20/2018 18:17      Benay Pike, MD 07/22/2018, 5:27 AM PGY-1, [place] Intern pager: [phone], text pages welcome

## 2018-07-22 NOTE — Telephone Encounter (Signed)
New message    TOC appt made per Daleen Snook for 12.31.19 at 9:00am with Rosaria Ferries.

## 2018-07-23 ENCOUNTER — Telehealth: Payer: Self-pay | Admitting: *Deleted

## 2018-07-23 DIAGNOSIS — I10 Essential (primary) hypertension: Secondary | ICD-10-CM

## 2018-07-23 LAB — CBC WITH DIFFERENTIAL/PLATELET
Abs Immature Granulocytes: 0.07 10*3/uL (ref 0.00–0.07)
BASOS PCT: 1 %
Basophils Absolute: 0.1 10*3/uL (ref 0.0–0.1)
Eosinophils Absolute: 0.3 10*3/uL (ref 0.0–0.5)
Eosinophils Relative: 4 %
HCT: 36.7 % (ref 36.0–46.0)
Hemoglobin: 11.5 g/dL — ABNORMAL LOW (ref 12.0–15.0)
IMMATURE GRANULOCYTES: 1 %
Lymphocytes Relative: 28 %
Lymphs Abs: 2.2 10*3/uL (ref 0.7–4.0)
MCH: 28.1 pg (ref 26.0–34.0)
MCHC: 31.3 g/dL (ref 30.0–36.0)
MCV: 89.7 fL (ref 80.0–100.0)
MONO ABS: 0.8 10*3/uL (ref 0.1–1.0)
Monocytes Relative: 11 %
Neutro Abs: 4.3 10*3/uL (ref 1.7–7.7)
Neutrophils Relative %: 55 %
PLATELETS: 272 10*3/uL (ref 150–400)
RBC: 4.09 MIL/uL (ref 3.87–5.11)
RDW: 14 % (ref 11.5–15.5)
WBC: 7.7 10*3/uL (ref 4.0–10.5)
nRBC: 0 % (ref 0.0–0.2)

## 2018-07-23 LAB — BASIC METABOLIC PANEL
Anion gap: 9 (ref 5–15)
BUN: 16 mg/dL (ref 8–23)
CO2: 24 mmol/L (ref 22–32)
Calcium: 8.7 mg/dL — ABNORMAL LOW (ref 8.9–10.3)
Chloride: 108 mmol/L (ref 98–111)
Creatinine, Ser: 0.69 mg/dL (ref 0.44–1.00)
GFR calc Af Amer: >60 mL/min (ref >60)
GFR calc non Af Amer: >60 mL/min (ref >60)
Glucose, Bld: 96 mg/dL (ref 70–99)
Potassium: 3.6 mmol/L (ref 3.5–5.1)
Sodium: 141 mmol/L (ref 135–145)

## 2018-07-23 NOTE — Care Management (Signed)
Left [name] with [facility] voicemail regarding patient discharge today. Per previous case manager note [name] with [facility] accepted referral.  Magdalen Spatz RN [phone]

## 2018-07-23 NOTE — Progress Notes (Addendum)
Occupational Therapy Treatment and Discharge Patient Details Name: [name] MRN: [id number] DOB: 03/17/1931 Today's Date: 07/23/2018    History of present illness Pt is an 84 y/o female presenting to ED with AMS and left sided deficits. MRI of the brain shows diffusion abnormality involving the right mesial temporal lobe and hippocampus suggestive of possible seizure , and punctate focus of stroke in the right cerebellum suggestive of acute or subacute infarct. Pt with PMH including meningioma status post craniotomy on the right side, subdural hemorrhage with resultant right parietal encephalomalacia Anemia, Asthma, AVM, Cancer, Coronary artery disease, Depression, GI bleed, Hiatal hernia, HLD, HTN, Low back pain, OA, Obesity, Obesity, Ovarian cyst, Right wrist injury, Rotator cuff arthropathy, Seizures (1980s), and Tuberculosis.    OT comments  This 84 yo female admitted with above presents to acute OT with continued decrease in balance requiring pt to want to "furniture walk" but not wanting to use AD ("it just gets in the way") thus affecting her safety with basic ADLs. Pt refusing SNF to next best thing will be HHOT. Pt to D/C today with [facility] services already set up. We will D/C from acute OT.  I have discussed the patient's current level of function related to basic ADLs with the patient. She acknowledges understanding of this and feel she can provide the level of care for herself that she needs at home with neighbor driving her prn  .        Follow Up Recommendations  SNF;Supervision/Assistance - 24 hour;Other (comment)(pt refusing SNF so HHOT is recommended)    Equipment Recommendations  Tub/shower seat       Precautions / Restrictions Precautions Precautions: Fall Restrictions Weight Bearing Restrictions: No       Mobility Bed Mobility Overal bed mobility: Independent                Transfers Overall transfer level: Needs assistance Equipment used: None    Sit to Stand: Min guard              Balance Overall balance assessment: Needs assistance Sitting-balance support: No upper extremity supported;Feet unsupported Sitting balance-Leahy Scale: Good     Standing balance support: No upper extremity supported;During functional activity Standing balance-Leahy Scale: Fair Standing balance comment: standing at sink to wash hands                           ADL either performed or assessed with clinical judgement   ADL Overall ADL's : Needs assistance/impaired     Grooming: Min guard;Standing;Wash/dry face           Upper Body Dressing : Supervision/safety;Set up;Sitting   Lower Body Dressing: Min guard;Sit to/from stand   Toilet Transfer: Min guard;Ambulation;BSC(over toilet)   Toileting- Clothing Manipulation and Hygiene: Min guard;Sit to/from stand         General ADL Comments: really needs min guard A when up on her feet, does not want to use AD but does "furninture walk"     Vision Baseline Vision/History: Wears glasses Wears Glasses: At all times Patient Visual Report: No change from baseline            Cognition Arousal/Alertness: Awake/alert Behavior During Therapy: WFL for tasks assessed/performed Overall Cognitive Status: No family/caregiver present to determine baseline cognitive functioning                           Safety/Judgement: Decreased awareness of  deficits                         Pertinent Vitals/ Pain       Pain Assessment: No/denies pain         Frequency  Min 2X/week        Progress Toward Goals  OT Goals(current goals can now be found in the care plan section)  Progress towards OT goals: (pt remains at minguard A when up on her feet due to decreased balance and "furniture walking")     Plan Discharge plan remains appropriate;Other (comment)(pt refusing SNF so HHOT is recommended)       AM-PAC OT "6 Clicks" Daily Activity     Outcome  Measure   Help from another person eating meals?: None Help from another person taking care of personal grooming?: A Little Help from another person toileting, which includes using toliet, bedpan, or urinal?: A Little Help from another person bathing (including washing, rinsing, drying)?: A Little Help from another person to put on and taking off regular upper body clothing?: A Little Help from another person to put on and taking off regular lower body clothing?: A Little 6 Click Score: 19    End of Session Equipment Utilized During Treatment: (none)  OT Visit Diagnosis: Unsteadiness on feet (R26.81);History of falling (Z91.81)   Activity Tolerance Patient tolerated treatment well   Patient Left in chair;with call bell/phone within reach;with chair alarm set   Nurse Communication          Time: 0737-1062 OT Time Calculation (min): 25 min  Charges: OT General Charges $OT Visit: 1 Visit OT Treatments $Self Care/Home Management : 23-37 mins Golden Circle, OTR/L Acute [employer] Pager [phone] Office [phone]      Almon Register 07/23/2018, 1:43 PM

## 2018-07-23 NOTE — Telephone Encounter (Signed)
Patient is scheduled for lab study on 08/27/18. Patient understands his sleep study will be done at [facility] sleep lab. Patient understands he will receive a sleep packet in a week or so. Patient understands to call if he does not receive the sleep packet in a timely manner. Patient agrees with treatment and thanked me for call. Patient asked to just send information in the mail about her sleep study as she was just getting home from the hospital.

## 2018-07-23 NOTE — Telephone Encounter (Signed)
Staff message sent to [name] ok to schedule sleep study. No PA is required. Patient has Medicare and Medicaid.

## 2018-07-23 NOTE — Telephone Encounter (Signed)
-----   Message from Lauralee Evener, [place] sent at 07/23/2018  2:05 PM EST ----- Regarding: RE: PRE CERT No PA is required. Patient has Medicare and Medicaid. Ok to schedule sleep study. ----- Message ----- From: Freada Bergeron, CMA Sent: 07/22/2018   6:28 PM EST To: [facility] Subject: PRE CERT                                         ----- Message ----- From: Abigail Butts., PA-C Sent: 07/22/2018   9:57 AM EST To: Freada Bergeron, CMA Subject: Outpatient sleep study                         Stark Bray!  Hoping you can get this patient scheduled for an outpatient sleep study. Anticipate she will be discharged from the hospital today.  Thanks! Daleen Snook

## 2018-07-23 NOTE — Care Management Important Message (Signed)
Important Message  Patient Details  Name: [name] MRN: [id number] Date of Birth: May 30, 1931   Medicare Important Message Given:  Yes    Taliya Mcclard Montine Circle 07/23/2018, 3:58 PM

## 2018-07-23 NOTE — Telephone Encounter (Signed)
-----   Message from Freada Bergeron, [place] sent at 07/22/2018  6:28 PM EST ----- Regarding: PRE CERT   ----- Message ----- From: Abigail Butts., PA-C Sent: 07/22/2018   9:57 AM EST To: Freada Bergeron, CMA Subject: Outpatient sleep study                         Stark Bray!  Hoping you can get this patient scheduled for an outpatient sleep study. Anticipate she will be discharged from the hospital today.  Thanks! Daleen Snook

## 2018-07-23 NOTE — Progress Notes (Signed)
Pt stated that she did not want to be discharged until she spoke with the MD regarding her lab results and "what happened" to her. MD came in this morning and talked with pt about care. MD stated that he would be back while making rounds with his attending. Pt still stating that she wants to speak with the MD again about her lab results. Awaiting MD.

## 2018-07-24 ENCOUNTER — Other Ambulatory Visit: Payer: Self-pay

## 2018-07-24 DIAGNOSIS — G609 Hereditary and idiopathic neuropathy, unspecified: Secondary | ICD-10-CM | POA: Diagnosis not present

## 2018-07-24 DIAGNOSIS — M5010 Cervical disc disorder with radiculopathy, unspecified cervical region: Secondary | ICD-10-CM | POA: Diagnosis not present

## 2018-07-24 DIAGNOSIS — I509 Heart failure, unspecified: Secondary | ICD-10-CM | POA: Diagnosis not present

## 2018-07-24 DIAGNOSIS — Z96653 Presence of artificial knee joint, bilateral: Secondary | ICD-10-CM | POA: Diagnosis not present

## 2018-07-24 DIAGNOSIS — M19039 Primary osteoarthritis, unspecified wrist: Secondary | ICD-10-CM | POA: Diagnosis not present

## 2018-07-24 DIAGNOSIS — E669 Obesity, unspecified: Secondary | ICD-10-CM | POA: Diagnosis not present

## 2018-07-24 DIAGNOSIS — F339 Major depressive disorder, recurrent, unspecified: Secondary | ICD-10-CM

## 2018-07-24 DIAGNOSIS — F319 Bipolar disorder, unspecified: Secondary | ICD-10-CM | POA: Diagnosis not present

## 2018-07-24 DIAGNOSIS — Z9181 History of falling: Secondary | ICD-10-CM | POA: Diagnosis not present

## 2018-07-24 DIAGNOSIS — J45909 Unspecified asthma, uncomplicated: Secondary | ICD-10-CM | POA: Diagnosis not present

## 2018-07-24 DIAGNOSIS — I2510 Atherosclerotic heart disease of native coronary artery without angina pectoris: Secondary | ICD-10-CM | POA: Diagnosis not present

## 2018-07-24 DIAGNOSIS — G40909 Epilepsy, unspecified, not intractable, without status epilepticus: Secondary | ICD-10-CM | POA: Diagnosis not present

## 2018-07-24 DIAGNOSIS — M810 Age-related osteoporosis without current pathological fracture: Secondary | ICD-10-CM | POA: Diagnosis not present

## 2018-07-24 DIAGNOSIS — M545 Low back pain: Secondary | ICD-10-CM | POA: Diagnosis not present

## 2018-07-24 DIAGNOSIS — R41840 Attention and concentration deficit: Secondary | ICD-10-CM | POA: Diagnosis not present

## 2018-07-24 DIAGNOSIS — I4891 Unspecified atrial fibrillation: Secondary | ICD-10-CM | POA: Diagnosis not present

## 2018-07-24 DIAGNOSIS — E7800 Pure hypercholesterolemia, unspecified: Secondary | ICD-10-CM | POA: Diagnosis not present

## 2018-07-24 DIAGNOSIS — I110 Hypertensive heart disease with heart failure: Secondary | ICD-10-CM | POA: Diagnosis not present

## 2018-07-24 DIAGNOSIS — D649 Anemia, unspecified: Secondary | ICD-10-CM | POA: Diagnosis not present

## 2018-07-24 NOTE — Telephone Encounter (Signed)
Patient contacted regarding discharge from [facility] on 07/23/18.  Patient understands to follow up with provider Rosaria Ferries, PA-C on 08/11/18 at 9:30am at [facility]. Patient understands discharge instructions? Yes Patient understands medications and regiment? Yes Patient understands to bring all medications to this visit? Yes  Patient was unhappy with care at the hospital

## 2018-07-25 DIAGNOSIS — R41840 Attention and concentration deficit: Secondary | ICD-10-CM | POA: Diagnosis not present

## 2018-07-25 DIAGNOSIS — G609 Hereditary and idiopathic neuropathy, unspecified: Secondary | ICD-10-CM | POA: Diagnosis not present

## 2018-07-25 DIAGNOSIS — F319 Bipolar disorder, unspecified: Secondary | ICD-10-CM | POA: Diagnosis not present

## 2018-07-25 DIAGNOSIS — I110 Hypertensive heart disease with heart failure: Secondary | ICD-10-CM | POA: Diagnosis not present

## 2018-07-25 DIAGNOSIS — G40909 Epilepsy, unspecified, not intractable, without status epilepticus: Secondary | ICD-10-CM | POA: Diagnosis not present

## 2018-07-25 DIAGNOSIS — I509 Heart failure, unspecified: Secondary | ICD-10-CM | POA: Diagnosis not present

## 2018-07-27 ENCOUNTER — Telehealth: Payer: Self-pay | Admitting: *Deleted

## 2018-07-27 ENCOUNTER — Telehealth: Payer: Self-pay

## 2018-07-27 MED ORDER — HYDROCHLOROTHIAZIDE 12.5 MG PO CAPS: 12.5000 mg | ORAL_CAPSULE | Freq: Every day | ORAL | 3 refills | Status: DC

## 2018-07-27 MED ORDER — FLUOXETINE HCL 20 MG PO TABS: 20.0000 mg | ORAL_TABLET | Freq: Every day | ORAL | 3 refills | Status: DC

## 2018-07-27 MED ORDER — ATORVASTATIN CALCIUM 40 MG PO TABS: ORAL_TABLET | ORAL | 3 refills | Status: DC

## 2018-07-27 NOTE — Telephone Encounter (Signed)
Called and gave verbal orders as requested.  

## 2018-07-27 NOTE — Telephone Encounter (Signed)
Pt daughter [name] is calling to speak to Dr. Andria Frames. Pt is getting more and more difficult to deal with and daughter is in [place] and needs to talk to Pt Dr to get some questions answered. The other daughter has been getting calls but she doesn't want to deal with their mom. [name] can be reached at [phone] Ottis Stain, CMA

## 2018-07-27 NOTE — Telephone Encounter (Signed)
Nicolette from [employer] calling for skilled RN verbal orders, for medication management, safety and education as follows:  2 time(s) weekly for 1 week(s), then 1 time(s) weekly for 7 week(s)  You can leave verbal orders on confidential voicemail.  Oceane Fosse, Salome Spotted, CMA

## 2018-07-27 NOTE — Telephone Encounter (Signed)
Listened to her concerns.  Reminded her that we recommended SNF and 24 hr supervision at hospital DC and Ms. Bayard Males refused.  I will see her Thursday, order a walker and try to order more home support.  Concluded that none of [place] control the outcome and the outcome may be bad.  Ms. Haubner is in the last chapter of her life.

## 2018-07-28 ENCOUNTER — Telehealth: Payer: Self-pay

## 2018-07-28 DIAGNOSIS — I110 Hypertensive heart disease with heart failure: Secondary | ICD-10-CM | POA: Diagnosis not present

## 2018-07-28 DIAGNOSIS — I509 Heart failure, unspecified: Secondary | ICD-10-CM | POA: Diagnosis not present

## 2018-07-28 DIAGNOSIS — G609 Hereditary and idiopathic neuropathy, unspecified: Secondary | ICD-10-CM | POA: Diagnosis not present

## 2018-07-28 DIAGNOSIS — F319 Bipolar disorder, unspecified: Secondary | ICD-10-CM | POA: Diagnosis not present

## 2018-07-28 DIAGNOSIS — R41840 Attention and concentration deficit: Secondary | ICD-10-CM | POA: Diagnosis not present

## 2018-07-28 DIAGNOSIS — G40909 Epilepsy, unspecified, not intractable, without status epilepticus: Secondary | ICD-10-CM | POA: Diagnosis not present

## 2018-07-28 NOTE — Telephone Encounter (Signed)
Patient daughter, [name], left message asking that home health care be ordered for patient. She has an appt on Thursday.   Left call back number of [phone]. (Looks as if patient may have [facility] set up already).  Danley Danker, RN [facility] RN)

## 2018-07-29 ENCOUNTER — Telehealth: Payer: Self-pay

## 2018-07-29 DIAGNOSIS — G609 Hereditary and idiopathic neuropathy, unspecified: Secondary | ICD-10-CM | POA: Diagnosis not present

## 2018-07-29 DIAGNOSIS — F319 Bipolar disorder, unspecified: Secondary | ICD-10-CM | POA: Diagnosis not present

## 2018-07-29 DIAGNOSIS — G40909 Epilepsy, unspecified, not intractable, without status epilepticus: Secondary | ICD-10-CM | POA: Diagnosis not present

## 2018-07-29 DIAGNOSIS — I509 Heart failure, unspecified: Secondary | ICD-10-CM | POA: Diagnosis not present

## 2018-07-29 DIAGNOSIS — I110 Hypertensive heart disease with heart failure: Secondary | ICD-10-CM | POA: Diagnosis not present

## 2018-07-29 DIAGNOSIS — R41840 Attention and concentration deficit: Secondary | ICD-10-CM | POA: Diagnosis not present

## 2018-07-29 NOTE — Telephone Encounter (Signed)
Yes.  Home health already approved.

## 2018-07-29 NOTE — Telephone Encounter (Signed)
Noted.  I will complete the paperwork as soon as I receive it.

## 2018-07-29 NOTE — Telephone Encounter (Signed)
Patient left message. She has appt with PCP tomorrow but had an evaluation with [facility] MSW this am who stated she would fax some paperwork over that needs to be completed as soon as possible.  Call back is [phone]  Danley Danker, RN [facility])

## 2018-07-30 ENCOUNTER — Other Ambulatory Visit: Payer: Self-pay

## 2018-07-30 ENCOUNTER — Encounter: Payer: Self-pay | Admitting: Family Medicine

## 2018-07-30 ENCOUNTER — Telehealth: Payer: Self-pay | Admitting: Medical

## 2018-07-30 ENCOUNTER — Ambulatory Visit (INDEPENDENT_AMBULATORY_CARE_PROVIDER_SITE_OTHER): Payer: Medicare Other | Admitting: Family Medicine

## 2018-07-30 DIAGNOSIS — R4182 Altered mental status, unspecified: Secondary | ICD-10-CM | POA: Diagnosis not present

## 2018-07-30 DIAGNOSIS — R569 Unspecified convulsions: Secondary | ICD-10-CM

## 2018-07-30 DIAGNOSIS — F339 Major depressive disorder, recurrent, unspecified: Secondary | ICD-10-CM

## 2018-07-30 DIAGNOSIS — I10 Essential (primary) hypertension: Secondary | ICD-10-CM | POA: Diagnosis not present

## 2018-07-30 DIAGNOSIS — I2510 Atherosclerotic heart disease of native coronary artery without angina pectoris: Secondary | ICD-10-CM

## 2018-07-30 DIAGNOSIS — Z609 Problem related to social environment, unspecified: Secondary | ICD-10-CM

## 2018-07-30 NOTE — Assessment & Plan Note (Signed)
Not overtly depressed at present.  However the spells of confusion may be a sign of her depression.

## 2018-07-30 NOTE — Assessment & Plan Note (Signed)
Blood pressure too low.  Stop amlodipine.

## 2018-07-30 NOTE — Patient Instructions (Signed)
I am glad that you are doing well since going home.   I will say again that it would be medically safest if you lived with someone or were in assisted living. I will fill out the form to get you services.  That is better than nothing.  A few hours a day is not as safe as assisted living.   Stop the amlodipine.  Your blood pressure is too low. See me in one month.  Sooner if problems. Keep working with the therapy to get stronger.

## 2018-07-30 NOTE — Telephone Encounter (Signed)
New message    Pt said she was in the hospital and was scheduled a sleep study and she wants to know why this was ordered and who approved for it to be ordered. She said she doesn't remember it being explained to her. Please call.

## 2018-07-30 NOTE — Assessment & Plan Note (Signed)
Should be in assisted living but refuses.  Daughter in [place].  Neighbor [name] is a huge help.

## 2018-07-30 NOTE — Assessment & Plan Note (Signed)
I am worried about the continued confusion episodes.  Is this depression or is this early dementia?  Again, not good that she lives alone.

## 2018-07-30 NOTE — Progress Notes (Signed)
Established Patient Office Visit  Subjective:  Patient ID: [name], female    DOB: [birth year]  Age: 84 y.o. MRN: [id number]  CC:  Chief Complaint  Patient presents with  . Hospitalization Follow-up    HPI [name] presents for FU hospitalization for presumed seizure.  She was recommended for assited living with 24 hour supervision and she refused.  Now at home with home health.  Today, I also filled out a request for PCS services.  She comes in with a neighbor.  Marche does not admit to any problems.  Neighbor states there is still some intermitant confusion.  I have also spoken with one of her two daughters in [place].  Because Donna [name] was a difficult mother, one daughter refuses to have anything to do with [name] and the other will reluctantly allow her to come live with her.  Freyja refuses to do anything in a hurry.  Perhaps she will move to [place] in one year. Still also weak and wobbly on standing. On the good side, she is clean and well dressed.  She is allowing nurse and PT to come to the home.  Her med regimen is simplified.   Past Medical History:  Diagnosis Date  . Anemia   . Asthma    once in 1950's  . AVM (arteriovenous malformation)   . Cancer (HCC)     right leg  skin  . Coronary artery disease   . Depression    sucide attempt in the distant past  . Excessive sweating   . GI bleed   . Hiatal hernia    s/p repair  . HLD (hyperlipidemia)   . HTN (hypertension)   . Hypertension   . Low back pain    ESI by Dr. Nelva Bush 2 weeks ago  . OA (osteoarthritis)    bilateral wrist, bilateral knees  . Obesity   . Obesity   . Ovarian cyst   . Right wrist injury    multiple surgeries and residual weakness.   . Rotator cuff arthropathy    right  . Seizures ([place]) 1980s   2/2 meningioma. none since cranioplasty  . Tuberculosis    + tb test      (father had)    Past Surgical History:  Procedure Laterality Date  . ABDOMINAL HYSTERECTOMY     left ovaries  . BREAST REDUCTION SURGERY    . CATARACT EXTRACTION Bilateral   . CRANIOPLASTY     2/2 meningioma 1980s  . CYSTECTOMY     rt ovary  age 42   . HIATAL HERNIA REPAIR    . KNEE ARTHROPLASTY     bilat  . LEFT HEART CATHETERIZATION WITH CORONARY ANGIOGRAM N/A 03/26/2012   Procedure: LEFT HEART CATHETERIZATION WITH CORONARY ANGIOGRAM;  Surgeon: Minus Breeding, MD;  Location: [facility] CATH LAB;  Service: Cardiovascular;  Laterality: N/A;  . REVERSE SHOULDER ARTHROPLASTY Right 03/29/2016  . REVERSE SHOULDER ARTHROPLASTY Right 03/29/2016   Procedure: RIGHT REVERSE SHOULDER ARTHROPLASTY;  Surgeon: Netta Cedars, MD;  Location: [facility];  Service: Orthopedics;  Laterality: Right;    Family History  Problem Relation Age of Onset  . Alzheimer's disease Mother   . Heart disease Father        MI 78yo  . Heart disease Brother     Social History   Socioeconomic History  . Marital status: Divorced    Spouse name: Not on file  . Number of children: Not on file  . Years of  education: Not on file  . Highest education level: Not on file  Occupational History  . Occupation: Retired- Health visitor  Social Needs  . Financial resource strain: Not on file  . Food insecurity:    Worry: Not on file    Inability: Not on file  . Transportation needs:    Medical: Not on file    Non-medical: Not on file  Tobacco Use  . Smoking status: Never Smoker  . Smokeless tobacco: Never Used  Substance and Sexual Activity  . Alcohol use: No  . Drug use: No  . Sexual activity: Never  Lifestyle  . Physical activity:    Days per week: Not on file    Minutes per session: Not on file  . Stress: Not on file  Relationships  . Social connections:    Talks on phone: Not on file    Gets together: Not on file    Attends religious service: Not on file    Active member of club or organization: Not on file    Attends meetings of clubs or organizations: Not on file    Relationship status: Not on file  . Intimate  partner violence:    Fear of current or ex partner: Not on file    Emotionally abused: Not on file    Physically abused: Not on file    Forced sexual activity: Not on file  Other Topics Concern  . Not on file  Social History Narrative   Patient identified her friend, [name], as her emergency contact at [phone].   Lives   Caffeine use:     Outpatient Medications Prior to Visit  Medication Sig Dispense Refill  . aspirin 81 MG EC tablet Take 1 tablet (81 mg total) by mouth daily. 30 tablet 0  . atorvastatin (LIPITOR) 40 MG tablet TAKE 1 TABLET BY MOUTH DAILY AT 6:00 PM 90 tablet 3  . Azelaic Acid 15 % cream Apply 1 application topically daily.  2  . calcium-vitamin D (OSCAL WITH D) 500-200 MG-UNIT tablet Take 1 tablet by mouth daily with breakfast.    . Coenzyme Q10 (COQ10) 200 MG CAPS Take 200 mg by mouth daily.     Marland Kitchen FLUoxetine (PROZAC) 20 MG tablet Take 1 tablet (20 mg total) by mouth daily. 90 tablet 3  . hydrochlorothiazide (MICROZIDE) 12.5 MG capsule Take 1 capsule (12.5 mg total) by mouth daily. 90 capsule 3  . isosorbide mononitrate (IMDUR) 30 MG 24 hr tablet Take 0.5 tablets (15 mg total) by mouth daily. 15 tablet 0  . latanoprost (XALATAN) 0.005 % ophthalmic solution Place 1 drop into both eyes at bedtime.  11  . levETIRAcetam (KEPPRA) 500 MG tablet TAKE 1 TABLET BY MOUTH TWICE DAILY 180 tablet 3  . magnesium 30 MG tablet Take 30 mg by mouth 2 (two) times daily.    Marland Kitchen amLODipine (NORVASC) 10 MG tablet TAKE 1 TABLET BY MOUTH DAILY 90 tablet 3   No facility-administered medications prior to visit.     Allergies  Allergen Reactions  . Amoxicillin Anaphylaxis  . Penicillins Anaphylaxis and Swelling    From childhood: Has patient had a PCN reaction causing immediate rash, facial/tongue/throat swelling, SOB or lightheadedness with hypotension: Yes Has patient had a PCN reaction causing severe rash involving mucus membranes or skin necrosis: Unk Has patient had a PCN  reaction that required hospitalization: No Has patient had a PCN reaction occurring within the last 10 years: No If all of the above answers are "  NO", then may proceed with Cephalosporin use. .  . Phenobarbital Other (See Comments)    Reaction not recalled by the patient  . Phenytoin Other (See Comments)    "Makes me feel funny"   . Tape Rash    Please use paper tape    ROS Review of Systems    Objective:    Physical Exam  BP (!) 92/48   Pulse 78   Temp 98 F (36.7 C) (Oral)   Ht 5\' 5"  (1.651 m)   Wt 178 lb 12.8 oz (81.1 kg)   SpO2 93%   BMI 29.75 kg/m  Wt Readings from Last 3 Encounters:  07/30/18 178 lb 12.8 oz (81.1 kg)  07/17/18 181 lb 7 oz (82.3 kg)  05/28/18 182 lb 3.2 oz (82.6 kg)  Note low BP Lungs clear Cardiac RRR without m or g Ext 1+ edema bilaterally Neuro oriented x 3 and mostly appropriate (until she got angry at me for not giving her a copy of all her records and told me to F** off. )   Health Maintenance Due  Topic Date Due  . INFLUENZA VACCINE  03/12/2018  . MAMMOGRAM  05/29/2018    There are no preventive care reminders to display for this patient.  Lab Results  Component Value Date   TSH 0.708 07/17/2018   Lab Results  Component Value Date   WBC 7.7 07/23/2018   HGB 11.5 (L) 07/23/2018   HCT 36.7 07/23/2018   MCV 89.7 07/23/2018   PLT 272 07/23/2018   Lab Results  Component Value Date   NA 141 07/23/2018   K 3.6 07/23/2018   CO2 24 07/23/2018   GLUCOSE 96 07/23/2018   BUN 16 07/23/2018   CREATININE 0.69 07/23/2018   BILITOT 0.3 07/16/2018   ALKPHOS 104 07/16/2018   AST 32 07/16/2018   ALT 18 07/16/2018   PROT 7.0 07/16/2018   ALBUMIN 3.4 (L) 07/16/2018   CALCIUM 8.7 (L) 07/23/2018   ANIONGAP 9 07/23/2018   Lab Results  Component Value Date   CHOL 122 07/17/2018   Lab Results  Component Value Date   HDL 44 07/17/2018   Lab Results  Component Value Date   LDLCALC 70 07/17/2018   Lab Results  Component Value  Date   TRIG 42 07/17/2018   Lab Results  Component Value Date   CHOLHDL 2.8 07/17/2018   Lab Results  Component Value Date   HGBA1C 5.0 07/17/2018      Assessment & Plan:   Problem List Items Addressed This Visit    HYPERTENSION, BENIGN SYSTEMIC    Blood pressure too low.  Stop amlodipine.         No orders of the defined types were placed in this encounter.   Follow-up: No follow-ups on file.    [name], MD

## 2018-07-31 DIAGNOSIS — I509 Heart failure, unspecified: Secondary | ICD-10-CM | POA: Diagnosis not present

## 2018-07-31 DIAGNOSIS — R41840 Attention and concentration deficit: Secondary | ICD-10-CM | POA: Diagnosis not present

## 2018-07-31 DIAGNOSIS — G609 Hereditary and idiopathic neuropathy, unspecified: Secondary | ICD-10-CM | POA: Diagnosis not present

## 2018-07-31 DIAGNOSIS — I110 Hypertensive heart disease with heart failure: Secondary | ICD-10-CM | POA: Diagnosis not present

## 2018-07-31 DIAGNOSIS — F319 Bipolar disorder, unspecified: Secondary | ICD-10-CM | POA: Diagnosis not present

## 2018-07-31 DIAGNOSIS — G40909 Epilepsy, unspecified, not intractable, without status epilepticus: Secondary | ICD-10-CM | POA: Diagnosis not present

## 2018-07-31 NOTE — Telephone Encounter (Signed)
THIS WAS ORDERED BY KRISTA-PA-C IN THE HOSPITAL.

## 2018-07-31 NOTE — Telephone Encounter (Signed)
Reached out to patient to inform patient that [place] ordered her the sleep study. Patient states she doesn't know Daleen Snook and she is refusing the sleep study because she had a seizure and can not remember things and she doesn't want to defraud medicare.

## 2018-08-02 ENCOUNTER — Other Ambulatory Visit: Payer: Self-pay | Admitting: Family Medicine

## 2018-08-02 DIAGNOSIS — F339 Major depressive disorder, recurrent, unspecified: Secondary | ICD-10-CM

## 2018-08-03 ENCOUNTER — Telehealth: Payer: Self-pay | Admitting: *Deleted

## 2018-08-03 DIAGNOSIS — I110 Hypertensive heart disease with heart failure: Secondary | ICD-10-CM | POA: Diagnosis not present

## 2018-08-03 DIAGNOSIS — F319 Bipolar disorder, unspecified: Secondary | ICD-10-CM | POA: Diagnosis not present

## 2018-08-03 DIAGNOSIS — R41840 Attention and concentration deficit: Secondary | ICD-10-CM | POA: Diagnosis not present

## 2018-08-03 DIAGNOSIS — G609 Hereditary and idiopathic neuropathy, unspecified: Secondary | ICD-10-CM | POA: Diagnosis not present

## 2018-08-03 DIAGNOSIS — G40909 Epilepsy, unspecified, not intractable, without status epilepticus: Secondary | ICD-10-CM | POA: Diagnosis not present

## 2018-08-03 DIAGNOSIS — I509 Heart failure, unspecified: Secondary | ICD-10-CM | POA: Diagnosis not present

## 2018-08-03 MED ORDER — FLUOXETINE HCL 40 MG PO CAPS: 40.0000 mg | ORAL_CAPSULE | Freq: Every day | ORAL | 0 refills | Status: DC

## 2018-08-03 MED ORDER — FLUOXETINE HCL 20 MG PO TABS: 20.0000 mg | ORAL_TABLET | Freq: Every day | ORAL | 3 refills | Status: DC

## 2018-08-03 NOTE — Telephone Encounter (Signed)
Received fax from pharmacy.  Fluoxetine is not a covered medication.  Preferred alternatives are citalopram tablet or sertraline tablets.  Marnesha Gagen, Salome Spotted, CMA

## 2018-08-03 NOTE — Telephone Encounter (Signed)
Called and clarified prozac. She had requested to go back up to 40 mg - I am OK with that. Also, for whatever reason, insurance does not cover fluoxetine.  But she is switching insurance on Jan 1.  Will give 2 week RX that she will need to pay cash for.  Hopefully, new insurance will cover fluoxetine which is proven to work for her.

## 2018-08-06 ENCOUNTER — Telehealth: Payer: Self-pay

## 2018-08-06 DIAGNOSIS — I509 Heart failure, unspecified: Secondary | ICD-10-CM | POA: Diagnosis not present

## 2018-08-06 DIAGNOSIS — G609 Hereditary and idiopathic neuropathy, unspecified: Secondary | ICD-10-CM | POA: Diagnosis not present

## 2018-08-06 DIAGNOSIS — R41840 Attention and concentration deficit: Secondary | ICD-10-CM | POA: Diagnosis not present

## 2018-08-06 DIAGNOSIS — F319 Bipolar disorder, unspecified: Secondary | ICD-10-CM | POA: Diagnosis not present

## 2018-08-06 DIAGNOSIS — I110 Hypertensive heart disease with heart failure: Secondary | ICD-10-CM | POA: Diagnosis not present

## 2018-08-06 DIAGNOSIS — G40909 Epilepsy, unspecified, not intractable, without status epilepticus: Secondary | ICD-10-CM | POA: Diagnosis not present

## 2018-08-06 NOTE — Telephone Encounter (Signed)
I called and told her that I did not see a compelling reason for a sleep study.

## 2018-08-06 NOTE — Telephone Encounter (Signed)
Pt LVM on nurse line stating, "I want to know who is in charge of my health, someone and I dont know who, has ordered me some type of sleep test." "I dont have any problems sleeping and they keep calling me to schedule." "I refuse to schedule this unless Dr. Andria Frames calls me with a valid reason to why I need this done." "I am broke and use medicaid and I dont want the good tax payers paying for this useless test."   I looked back in her chart. On 12/12 physicians assistant Roby Lofts ordered this sleep study under the diagnoses of hypertension.

## 2018-08-10 ENCOUNTER — Telehealth: Payer: Self-pay

## 2018-08-10 DIAGNOSIS — R569 Unspecified convulsions: Secondary | ICD-10-CM

## 2018-08-10 NOTE — Telephone Encounter (Signed)
Patient left message asking to speak with Dr Jeannine Kitten who D/C'ed her from the hospital about a couple of questions on her D/C papers. Does not want to speak to Dr Andria Frames, does not want to speak to the nurse, only Dr Jeannine Kitten who did her D/C from hospital.  Call back is [phone]  Danley Danker, RN [facility] RN)

## 2018-08-11 ENCOUNTER — Ambulatory Visit: Payer: Medicare Other | Admitting: Physician Assistant

## 2018-08-11 ENCOUNTER — Telehealth: Payer: Self-pay | Admitting: Family Medicine

## 2018-08-11 DIAGNOSIS — I110 Hypertensive heart disease with heart failure: Secondary | ICD-10-CM | POA: Diagnosis not present

## 2018-08-11 DIAGNOSIS — F319 Bipolar disorder, unspecified: Secondary | ICD-10-CM | POA: Diagnosis not present

## 2018-08-11 DIAGNOSIS — G40909 Epilepsy, unspecified, not intractable, without status epilepticus: Secondary | ICD-10-CM | POA: Diagnosis not present

## 2018-08-11 DIAGNOSIS — I509 Heart failure, unspecified: Secondary | ICD-10-CM | POA: Diagnosis not present

## 2018-08-11 DIAGNOSIS — R41840 Attention and concentration deficit: Secondary | ICD-10-CM | POA: Diagnosis not present

## 2018-08-11 DIAGNOSIS — G609 Hereditary and idiopathic neuropathy, unspecified: Secondary | ICD-10-CM | POA: Diagnosis not present

## 2018-08-11 NOTE — Telephone Encounter (Signed)
Patient called emergency line and states she needed "something for anxiety" and she knew "that is what is wrong with me". She stated she was feeling confused. She did not remember I was one of the doctors that took care of her when she was admitted for a stroke.   I informed her it would not be good care to prescribe a new medication without her being seen by a doctor. I also informed her I was concerned that her symptoms of confusion could be due to something else and not anxiety ie another stroke. I encouraged her to reach out to a friend to help get her to a healthcare provider/ED but she declines.

## 2018-08-12 NOTE — Telephone Encounter (Signed)
Spoke with patient who had earlier requested to speak with me, as my name was on the prescription for her Keppra.  She was stating she was having an anxiety attack and just needed something to get her through today. Told patient I could not prescribe her something over the phone like that and she understood.  Reminded patient about her call to the aftr hours line yesterday and that Dr. Garlan Fillers would have liked her to see a physician but she reiterated she did not want to ask someone to take her to the hospital today. She also stated she needs a neurologist and asked me for recommendations for a neurology referral. I told her that is something she will need to discuss with her PCP, Dr. Andria Frames, and that I would pass the message along to him.

## 2018-08-13 ENCOUNTER — Encounter (HOSPITAL_COMMUNITY): Payer: Self-pay

## 2018-08-13 ENCOUNTER — Other Ambulatory Visit: Payer: Self-pay

## 2018-08-13 ENCOUNTER — Emergency Department (HOSPITAL_COMMUNITY)
Admission: EM | Admit: 2018-08-13 | Discharge: 2018-08-13 | Disposition: A | Discharge status: Home / Self Care | Payer: Medicare HMO | Attending: Emergency Medicine | Admitting: Emergency Medicine

## 2018-08-13 DIAGNOSIS — I259 Chronic ischemic heart disease, unspecified: Secondary | ICD-10-CM | POA: Diagnosis not present

## 2018-08-13 DIAGNOSIS — Z7982 Long term (current) use of aspirin: Secondary | ICD-10-CM | POA: Insufficient documentation

## 2018-08-13 DIAGNOSIS — I10 Essential (primary) hypertension: Secondary | ICD-10-CM | POA: Insufficient documentation

## 2018-08-13 DIAGNOSIS — G40909 Epilepsy, unspecified, not intractable, without status epilepticus: Secondary | ICD-10-CM | POA: Insufficient documentation

## 2018-08-13 DIAGNOSIS — F419 Anxiety disorder, unspecified: Secondary | ICD-10-CM | POA: Insufficient documentation

## 2018-08-13 DIAGNOSIS — R251 Tremor, unspecified: Secondary | ICD-10-CM | POA: Diagnosis present

## 2018-08-13 DIAGNOSIS — J45909 Unspecified asthma, uncomplicated: Secondary | ICD-10-CM | POA: Insufficient documentation

## 2018-08-13 DIAGNOSIS — T7840XA Allergy, unspecified, initial encounter: Secondary | ICD-10-CM | POA: Diagnosis not present

## 2018-08-13 DIAGNOSIS — Z79899 Other long term (current) drug therapy: Secondary | ICD-10-CM | POA: Diagnosis not present

## 2018-08-13 LAB — BASIC METABOLIC PANEL
Anion gap: 10 (ref 5–15)
BUN: 14 mg/dL (ref 8–23)
CO2: 26 mmol/L (ref 22–32)
Calcium: 9.4 mg/dL (ref 8.9–10.3)
Chloride: 106 mmol/L (ref 98–111)
Creatinine, Ser: 0.64 mg/dL (ref 0.44–1.00)
GFR calc Af Amer: >60 mL/min (ref >60)
GFR calc non Af Amer: >60 mL/min (ref >60)
Glucose, Bld: 117 mg/dL — ABNORMAL HIGH (ref 70–99)
Potassium: 3.6 mmol/L (ref 3.5–5.1)
Sodium: 142 mmol/L (ref 135–145)

## 2018-08-13 LAB — CBC WITH DIFFERENTIAL/PLATELET
Abs Immature Granulocytes: 0.03 10*3/uL (ref 0.00–0.07)
Basophils Absolute: 0.1 10*3/uL (ref 0.0–0.1)
Basophils Relative: 1 %
Eosinophils Absolute: 0.1 10*3/uL (ref 0.0–0.5)
Eosinophils Relative: 1 %
HCT: 41.2 % (ref 36.0–46.0)
Hemoglobin: 13.1 g/dL (ref 12.0–15.0)
IMMATURE GRANULOCYTES: 0 %
Lymphocytes Relative: 20 %
Lymphs Abs: 1.9 10*3/uL (ref 0.7–4.0)
MCH: 28.1 pg (ref 26.0–34.0)
MCHC: 31.8 g/dL (ref 30.0–36.0)
MCV: 88.2 fL (ref 80.0–100.0)
MONOS PCT: 9 %
Monocytes Absolute: 0.9 10*3/uL (ref 0.1–1.0)
NEUTROS PCT: 69 %
Neutro Abs: 6.6 10*3/uL (ref 1.7–7.7)
Platelets: 312 10*3/uL (ref 150–400)
RBC: 4.67 MIL/uL (ref 3.87–5.11)
RDW: 13.6 % (ref 11.5–15.5)
WBC: 9.5 10*3/uL (ref 4.0–10.5)
nRBC: 0 % (ref 0.0–0.2)

## 2018-08-13 MED ORDER — DIVALPROEX SODIUM 250 MG PO DR TAB
250.0000 mg | DELAYED_RELEASE_TABLET | Freq: Once | ORAL | Status: AC
  Administered 2018-08-13: 250 mg via ORAL
  Filled 2018-08-13: qty 1

## 2018-08-13 MED ORDER — DIVALPROEX SODIUM 250 MG PO DR TAB: 250.0000 mg | DELAYED_RELEASE_TABLET | Freq: Three times a day (TID) | ORAL | 0 refills | Status: DC

## 2018-08-13 MED ORDER — LORAZEPAM 0.5 MG PO TABS
0.5000 mg | ORAL_TABLET | Freq: Once | ORAL | Status: AC
  Administered 2018-08-13: 0.5 mg via ORAL
  Filled 2018-08-13: qty 1

## 2018-08-13 MED ORDER — LORAZEPAM 0.5 MG PO TABS: 0.5000 mg | ORAL_TABLET | Freq: Three times a day (TID) | ORAL | 0 refills | Status: DC | PRN

## 2018-08-13 NOTE — ED Notes (Signed)
Provided pt a sandwich and diet coke.

## 2018-08-13 NOTE — ED Notes (Signed)
Pt began shaking as soon as D/C was being discussed.

## 2018-08-13 NOTE — ED Notes (Signed)
When wheeling pt out for discharge, pt stated, "I don't why I came, I can be comfortable at home!  What a waste of taxpayers money! Damn the [place] sand-which and two diet cokes1"

## 2018-08-13 NOTE — ED Notes (Signed)
When getting in car pt stated she was having a seizure.  It was explained to pt that if you are aware the seizure happening, you're not actually having a seizure.

## 2018-08-13 NOTE — ED Notes (Signed)
Bed: [facility] Expected date:  Expected time:  Means of arrival:  Comments: EMS-tremors

## 2018-08-13 NOTE — Care Management Note (Signed)
Case Management Note  Patient Details  Name: [name] MRN: [id number] Date of Birth: 09-Dec-1930  CM consulted for [facility] needs and to contact the pt's daughter by Dr. Eulis Foster.  CM noted that [facility] with [facility].  CM contacted [name] with [facility] to advise of pt's situation.  CM updated Dr. Eulis Foster that pt is already active with [facility] and he advised he thought she was but pt was unable to provide that information.    CM contacted [name] pt's oldest daughter in [place], [place].  She advised that she is the pt's POA, at least as of 4 years ago she still was, and that she is unable to travel to [place] due to pending back surgery.  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  She reports her sister [name], was in [place] not long ago and runs her own business so likely will not be able to come to [place] again soon due to cost and business responsibilities.  The pt has another child, a son who has been homeless for years and they have not had contact with him in the last 3 - 4 years.  She further advised that the pt nor the doctors or her recent hospital encounters have updated her as to what exactly has been going on.  CM spoke with [name] at length about Medicaid PCS, ALF, POA, legal guardianship, HH, and APS.  CM contacted [name] with [facility] to update on information from daughter [name] and received pt's [facility] nurse Dana's contact information, [phone].  [name] will pass all information on to the [facility] team including [name]'s contact information.  CM spoke with pt at bedside who reported she was very upset with the doctor in the hospital who started her on Keppra.  She was further upset with Dr. Andria Frames who signed off for her to continue to use it without having gone over the side effects with her prior to starting it.  She was then upset with the on call provider at Dr. Lowella Bandy office on New Years Day who would not prescribe her "1 or 2" pills for anxiety.  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  She  repeatedly said that she had the same education as "[place]" and that she will make her own decisions and "doesn't want no help from no body. I can take care of myself on my own."  CM discussed the possibility of moving into an ALF.  Pt advised she was open to that here or in [place] as long as she could take her dogs with her.  Then pt said she wouldn't go to [place] because all of her friends are here.  At times pt would give permission for CM to contact pt's daughter [name] (who was not in the chart for contact until CM added her later) because she was the more "caring one" while [name] was "self-centered."  Pt would then retract saying she didn't want either of her daughters called and only to tell [name], a neighbor, about everything that's happened because "I won't remember."  Pt also denies that she has a POA.  Pt was increasing in agitation during conversation.  She further went on to say that she is in a reverse mortgage in her home so when she leaves she will "just sign it over to the bank" with no profit.  CM discussed that pt was already active with [facility].  She said "Yes, someone was  suppose to come to my home today at 2 pm but I've never had home health before."  CM advised her that she had been with [facility] for several months now.  Pt did not remember this information.  Pt also advised that Dr. Andria Frames told her he was setting her up for 20 hours a week of help at home.  Pt could not elaborate further on what this meant.  CM discussed that pt would be going home today and she became further upset.  She said that [name] could not drive in the rain and that is was getting dark.  CM discussed PTAR but advised that pt would have to pay for transport as she can walk safely and safely ride in a cab back to home if [name] is unable to come.  Pt was again upset about possibly having to ride in cab.  She kept repeating that the cab drive "is not a Runner, broadcasting/film/video.  What if I have another seizure?"  CM asked her what would happen once  she is home and had another seizure or and also reminded her friend [name] would not be a Runner, broadcasting/film/video either if she were to drive the pt home.  Pt did not have any answer to my line of conversation.  Pt just remained frustrated and agitated.  Pt then went on to say she can't leave because she doesn't drive and cant get her medications.  She reports the medication has to be faxed by the doctor to [facility] and they will deliver the next day.  CM advised her that I would work on the problem and she was being given her first dose of Depakote in the ED prior to leaving.  CM contacted [name] with [facility] to see if they were able to assist the pt with getting her new prescriptions.  CM also updated her on conversation with the pt.  CM also contacted CSW to assist with transportation needs with a cab and contacting [name] the pt's neighbor who has the pt's house key.  CSW later contacted CM and advised that [name] would be picking the pt up and would also be able to get the pt's prescriptions which were [facility] (information on AVS).  CM updated pt that [name] was on the way and would get her medications for her as well.  Pt remained upset and repeated many of her other complaints about recent events.  When CM advised her that she had already told CM this information, pt did not remember.  Pt also did not recall speaking with CM just a few minutes prior.  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  CM contacted [name] again to update her on conversation with pt about possibly being agreeable to move into an ALF.  CM also provided her with [facility] RN Dana's contact information.  She requested that [name] call her later today.  Maddie, RN will relay the request of [name].  CM again discussed with [name] various options and pt's needs and encouraged her to speak with her sister Mikki Harbor the [facility] RN, and Dr. Andria Frames to make a plan.  CM also advised  her that CM would contact [name] per pt's request.  CM spoke at length with [name] about all of the same things that had been discussed with the pt and [name].  Also provided [name] with [facility] RN's contact information.  [name] advised that  her and [name] were currently not really speaking and that she will talk to [place] later.  CM encouraged her to speak with [name] and [name] on a 3-way-call and that her and [name] needed to speak at length about a plan for the pt.  She acknowledged the need for team conversations.  No further CM needs noted at this time.  Expected Discharge Date:   08/14/2018               Expected Discharge Plan:  [place]  In-House Referral:  Clinical Social Work  Discharge planning Services  CM Consult  Post Acute Care Choice:  Home Health Choice offered to:  Patient, Adult Children, [place] / Guardian  HH Arranged:  RN, PT, OT, Nurse's Aide, Speech Therapy, Social Work [employer] Agency:  [facility]  Status of Service:  Completed, signed off  Rae Mar, RN 08/13/2018, 5:06 PM

## 2018-08-13 NOTE — ED Notes (Signed)
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

## 2018-08-13 NOTE — ED Notes (Signed)
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

## 2018-08-13 NOTE — ED Notes (Signed)
When asked pt allergies in triage, pt rolls her eyes at this RN and says, "Keppra".

## 2018-08-13 NOTE — ED Triage Notes (Signed)
Per EMS: Pt from home.  Pt has hx of tremors, that is worsened with anxiety.  Pt had seizure on 12/15 and was prescribed Keppra.  Pt believes she is experiencing side effects of Keppra, which is making her anxious and exacerbating her tremors

## 2018-08-13 NOTE — ED Notes (Signed)
Pt given a diet coke and sandwich to eat.

## 2018-08-13 NOTE — ED Notes (Signed)
When going over pt's D/C paperwork, patient yelled to RN, "I can't stand this incompetence!"  Pt also stated to go over paperwork with her friend [name] because she can't remember anything.

## 2018-08-13 NOTE — ED Notes (Signed)
Pt is becoming agitated, shaking the bed railing and yelling at nurse, "no one is listening to me!

## 2018-08-13 NOTE — ED Notes (Signed)
Pt continuously repeats herself, stating she knows what she needs and yells to this RN "I am not stupid! I am just as educated as you are!"

## 2018-08-13 NOTE — Discharge Instructions (Addendum)
Call your doctor to discuss further care and treatment as soon as possible.  We are arranging for home health services to come to your home to assess and treat you.  Stop taking the [place] for now.  A prescription for Depakote was sent to your pharmacy.  We are also giving you a short course of anxiety medicine, lorazepam to help with your discomfort.

## 2018-08-13 NOTE — Progress Notes (Addendum)
CSW received a phone call from the RN CM who stated pt's instructions for picking up the pt's medications (seizure medications) will be on the D/C paperwork.    Per the RN CM the pt's medications will be a the [facility] on [address] at the [facility] today and can be picked up today or tomorrow.  \4:33 PM   Pt's friend [name] at ph: [phone] called and is en route to p/u the pt and should be at the ED entrance at approx 4:55pm.  Pt's friend is elderly and will remain at the ED entrance and the pt will need to be brought out to the pt's friend's car.  Pt's friend will call the CSW when the pt's friend is near the ED.  ED RN CM/RN updated.  CSW will continue to follow for D/C needs.  Alphonse Guild. Wilma Michaelson, LCSW, LCAS, CSI Clinical Social Worker Ph: [phone]

## 2018-08-13 NOTE — ED Provider Notes (Signed)
[facility] DEPT Provider Note   CSN: [id number] Arrival date & time: 08/13/18  0944     History   Chief Complaint Chief Complaint  Patient presents with  . Tremors    HPI [name] is a 85 y.o. female.  HPI   She presents for evaluation of anxiety, which she feels is secondary to [place] which she was recently prescribed for current of seizures.  In the last several days she has felt more and more anxious, and having periods of heart racing, and ill-ease.  Last night she took a half dose of her Keppra, 250 mg, did not take any this morning.  She has a remote history of seizures after removal of a meningioma.  He was weaned off triple therapy for epilepsy and had several years of absence of seizures.  She denies recent fever, chills, cough, shortness of breath, change in bowel or urine habits.  Today she came here by EMS for evaluation.  She wants to see a neurologist.  She wants to stop taking the [place].  There are no other known modifying factors.  Past Medical History:  Diagnosis Date  . Anemia   . Asthma    once in 1950's  . AVM (arteriovenous malformation)   . Cancer (HCC)     right leg  skin  . Coronary artery disease   . Depression    sucide attempt in the distant past  . Excessive sweating   . GI bleed   . Hiatal hernia    s/p repair  . HLD (hyperlipidemia)   . HTN (hypertension)   . Hypertension   . Low back pain    ESI by Dr. Nelva Bush 2 weeks ago  . OA (osteoarthritis)    bilateral wrist, bilateral knees  . Obesity   . Obesity   . Ovarian cyst   . Right wrist injury    multiple surgeries and residual weakness.   . Rotator cuff arthropathy    right  . Seizures ([place]) 1980s   2/2 meningioma. none since cranioplasty  . Tuberculosis    + tb test      (father had)    Patient Active Problem List   Diagnosis Date Noted  . High risk social situation 07/30/2018  . 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 Focal  neurological deficit 07/17/2018  . Altered mental status   . Frequent falls 12/25/2017  . Weight loss, non-intentional 08/14/2017  . S/P shoulder replacement 03/29/2016  . Diverticulitis of colon without hemorrhage 12/04/2015  . Rotator cuff syndrome of left shoulder 10/26/2015  . Cervical disc disorder with radiculopathy of cervical region 10/26/2015  . Heart murmur, systolic 75/17/0017  . Rosacea 09/07/2015  . Adenomatous colon polyp 04/12/2014  . Hearing loss in left ear 04/13/2013  . Coronary artery disease 03/25/2012  . Osteoporosis 11/13/2011  . Pure hypercholesterolemia 10/09/2006  . Major depression, recurrent, chronic ([place]) 10/09/2006  . Hereditary and idiopathic peripheral neuropathy 10/09/2006  . HYPERTENSION, BENIGN SYSTEMIC 10/09/2006  . OSTEOARTHRITIS, MULTI SITES 10/09/2006  . Seizures ([place]) 10/09/2006    Past Surgical History:  Procedure Laterality Date  . ABDOMINAL HYSTERECTOMY     left ovaries  . BREAST REDUCTION SURGERY    . CATARACT EXTRACTION Bilateral   . CRANIOPLASTY     2/2 meningioma 1980s  . CYSTECTOMY     rt ovary  age 50   . HIATAL HERNIA REPAIR    . KNEE ARTHROPLASTY     bilat  .  LEFT HEART CATHETERIZATION WITH CORONARY ANGIOGRAM N/A 03/26/2012   Procedure: LEFT HEART CATHETERIZATION WITH CORONARY ANGIOGRAM;  Surgeon: Minus Breeding, MD;  Location: [facility] CATH LAB;  Service: Cardiovascular;  Laterality: N/A;  . REVERSE SHOULDER ARTHROPLASTY Right 03/29/2016  . REVERSE SHOULDER ARTHROPLASTY Right 03/29/2016   Procedure: RIGHT REVERSE SHOULDER ARTHROPLASTY;  Surgeon: Netta Cedars, MD;  Location: [facility];  Service: Orthopedics;  Laterality: Right;     OB History   No obstetric history on file.      Home Medications    Prior to Admission medications   Medication Sig Start Date End Date Taking? Authorizing Provider  aspirin 81 MG EC tablet Take 1 tablet (81 mg total) by mouth daily. 07/23/18   Benay Pike, MD  atorvastatin (LIPITOR) 40 MG  tablet TAKE 1 TABLET BY MOUTH DAILY AT 6:00 PM 07/27/18   [name], MD  Azelaic Acid 15 % cream Apply 1 application topically daily. 04/08/18   [provider]  calcium-vitamin D (OSCAL WITH D) 500-200 MG-UNIT tablet Take 1 tablet by mouth daily with breakfast. 06/15/18   [name], MD  Coenzyme Q10 (COQ10) 200 MG CAPS Take 200 mg by mouth daily.     [provider]  FLUoxetine (PROZAC) 40 MG capsule Take 1 capsule (40 mg total) by mouth daily. 08/03/18   [name], MD  hydrochlorothiazide (MICROZIDE) 12.5 MG capsule TAKE ONE CAPSULE BY MOUTH EVERY DAY 08/03/18   [name], MD  isosorbide mononitrate (IMDUR) 30 MG 24 hr tablet Take 0.5 tablets (15 mg total) by mouth daily. 07/23/18 08/22/18  Benay Pike, MD  latanoprost (XALATAN) 0.005 % ophthalmic solution Place 1 drop into both eyes at bedtime. 05/07/18   [provider]  levETIRAcetam (KEPPRA) 500 MG tablet TAKE 1 TABLET BY MOUTH TWICE DAILY Patient taking differently: Take 500 mg by mouth 2 (two) times daily.  07/23/18   [name], MD  magnesium 30 MG tablet Take 30 mg by mouth 2 (two) times daily.    [provider]    Family History Family History  Problem Relation Age of Onset  . Alzheimer's disease Mother   . Heart disease Father        MI 53yo  . Heart disease Brother     Social History Social History   Tobacco Use  . Smoking status: Never Smoker  . Smokeless tobacco: Never Used  Substance Use Topics  . Alcohol use: No  . Drug use: No     Allergies   Amoxicillin; Penicillins; Phenobarbital; Phenytoin; and Tape   Review of Systems Review of Systems  All other systems reviewed and are negative.    Physical Exam Updated Vital Signs BP (!) 175/90   Pulse 74   Temp 98.4 F (36.9 C) (Oral)   Resp 16   Ht 5\' 5"  (1.651 m)   Wt 77.1 kg   SpO2 93%   BMI 28.29 kg/m   Physical Exam Vitals signs and nursing note reviewed.    Constitutional:      General: She is not in acute distress.    Appearance: She is well-developed. She is not ill-appearing, toxic-appearing or diaphoretic.     Comments: Elderly, frail  HENT:     Head: Normocephalic and atraumatic.     Right Ear: External ear normal.     Left Ear: External ear normal.     Nose: Nose normal. No congestion or rhinorrhea.     Mouth/Throat:  Mouth: Mucous membranes are moist.  Eyes:     Conjunctiva/sclera: Conjunctivae normal.     Pupils: Pupils are equal, round, and reactive to light.  Neck:     Musculoskeletal: Normal range of motion and neck supple.     Trachea: Phonation normal.  Cardiovascular:     Rate and Rhythm: Normal rate and regular rhythm.     Heart sounds: Normal heart sounds.  Pulmonary:     Effort: Pulmonary effort is normal.     Breath sounds: Normal breath sounds.  Abdominal:     Palpations: Abdomen is soft.     Tenderness: There is no abdominal tenderness.  Musculoskeletal: Normal range of motion.  Skin:    General: Skin is warm and dry.  Neurological:     Mental Status: She is alert and oriented to person, place, and time.     Cranial Nerves: No cranial nerve deficit.     Sensory: No sensory deficit.     Motor: No abnormal muscle tone.     Coordination: Coordination normal.  Psychiatric:        Behavior: Behavior normal.        Thought Content: Thought content normal.        Judgment: Judgment normal.      ED Treatments / Results  Labs (all labs ordered are listed, but only abnormal results are displayed) Labs Reviewed  BASIC METABOLIC PANEL - Abnormal; Notable for the following components:      Result Value   Glucose, Bld 117 (*)    All other components within normal limits  CBC WITH DIFFERENTIAL/PLATELET    EKG None  Radiology No results found.  Procedures .Critical Care Performed by: Daleen Bo, MD Authorized by: Daleen Bo, MD   Critical care provider statement:    Critical care time  (minutes):  35   Critical care start time:  08/13/2018 10:30 AM   Critical care end time:  08/13/2018 2:30 PM   Critical care time was exclusive of:  Separately billable procedures and treating other patients   Critical care was time spent personally by me on the following activities:  Blood draw for specimens, development of treatment plan with patient or surrogate, discussions with consultants, evaluation of patient's response to treatment, examination of patient, obtaining history from patient or surrogate, ordering and performing treatments and interventions, ordering and review of laboratory studies, pulse oximetry, re-evaluation of patient's condition, review of old charts and ordering and review of radiographic studies   (including critical care time)  Medications Ordered in ED Medications  LORazepam (ATIVAN) tablet 0.5 mg (0.5 mg Oral Given 08/13/18 1211)     Initial Impression / Assessment and Plan / ED Course  I have reviewed the triage vital signs and the nursing notes.  Pertinent labs & imaging results that were available during my care of the patient were reviewed by me and considered in my medical decision making (see chart for details).  Clinical Course as of Aug 13 1418  Thu Aug 13, 2018  1314 She fell asleep after receiving Ativan.   [EW]  1314 Normal except glucose high  Basic metabolic panel(!) [EW]  5573 Normal  CBC with Differential [EW]  2202 I had a discussion with the patient's daughter, her name is [name], she lives in [place].  Her phone number is [phone].  She is concerned that her mother is having worsening dementia.  Her mother needs help to ambulate, but does not want to use assistive devices.  [name] is  unaware if her mother has a healthcare power of attorney, or if home health services have been established, yet.  She thinks her mother would benefit from a low-dose medication for anxiety and changing from [place] to Depakote.   [EW]    Clinical Course User  Index [EW] Daleen Bo, MD     Patient Vitals for the past 24 hrs:  BP Temp Temp src Pulse Resp SpO2 Height Weight  08/13/18 1316 (!) 175/90 - - 74 16 93 % - -  08/13/18 1156 105/74 - - (!) 59 18 97 % - -  08/13/18 1006 - - - - - - 5\' 5"  (1.651 m) 77.1 kg  08/13/18 1005 (!) 180/91 98.4 F (36.9 C) Oral 66 18 96 % - -  08/13/18 0955 - - - - - 98 % - -     At D/C- Reevaluation with update and discussion. After initial assessment and treatment, an updated evaluation reveals she is calm and comfortable.Daleen Bo   Medical Decision Making: Intolerance of Keppra. Nonspecific anxiety. Doubt CVA, metabolic instability of impending vascular collapse.  CRITICAL CARE- yes Performed by: Daleen Bo   Nursing Notes Reviewed/ Care Coordinated Applicable Imaging Reviewed Interpretation of Laboratory Data incorporated into ED treatment  The patient appears reasonably screened and/or stabilized for discharge and I doubt any other medical condition or other [facility] requiring further screening, evaluation, or treatment in the ED at this time prior to discharge.  Plan: Home Medications- stop Keppra, continue usual meds; Home Treatments- rest; return here if the recommended treatment, does not improve the symptoms; Recommended follow up- PCP 1 week for check up   Final Clinical Impressions(s) / ED Diagnoses   Final diagnoses:  Anxiety  Nonintractable epilepsy without status epilepticus, unspecified epilepsy type [facility])    ED Discharge Orders    None       Daleen Bo, MD 08/18/18 1337

## 2018-08-13 NOTE — ED Notes (Signed)
Pt repeatedly asking for a neurologist.  Stating "if that damn resident had just read ALL the side effects of keppra I wouldn't be here."  Pt states she only took half a dose (250 mg) of keppra last night.

## 2018-08-14 ENCOUNTER — Telehealth: Payer: Self-pay | Admitting: Family Medicine

## 2018-08-14 ENCOUNTER — Telehealth: Payer: Self-pay

## 2018-08-14 DIAGNOSIS — F319 Bipolar disorder, unspecified: Secondary | ICD-10-CM | POA: Diagnosis not present

## 2018-08-14 DIAGNOSIS — I110 Hypertensive heart disease with heart failure: Secondary | ICD-10-CM | POA: Diagnosis not present

## 2018-08-14 DIAGNOSIS — M545 Low back pain: Secondary | ICD-10-CM | POA: Diagnosis not present

## 2018-08-14 DIAGNOSIS — I4891 Unspecified atrial fibrillation: Secondary | ICD-10-CM | POA: Diagnosis not present

## 2018-08-14 DIAGNOSIS — G40909 Epilepsy, unspecified, not intractable, without status epilepticus: Secondary | ICD-10-CM | POA: Diagnosis not present

## 2018-08-14 DIAGNOSIS — G8384 Todd's paralysis (postepileptic): Secondary | ICD-10-CM | POA: Diagnosis not present

## 2018-08-14 DIAGNOSIS — G609 Hereditary and idiopathic neuropathy, unspecified: Secondary | ICD-10-CM | POA: Diagnosis not present

## 2018-08-14 DIAGNOSIS — I509 Heart failure, unspecified: Secondary | ICD-10-CM | POA: Diagnosis not present

## 2018-08-14 DIAGNOSIS — I2510 Atherosclerotic heart disease of native coronary artery without angina pectoris: Secondary | ICD-10-CM | POA: Diagnosis not present

## 2018-08-14 NOTE — Telephone Encounter (Signed)
Pt LVM on nurse line stating "I dont know who my doctor is anymore, I am so confused, I do not even know if it is the weekend." Pt stated she needs a referral for neurologist. Pt stated on VM, "I can not walk or hardly talk, I needs a neurologist." I attempted to contacted pt to obtain more information and remind her Andria Frames is her MD. No answer.   Will forward to MD to advise.

## 2018-08-14 NOTE — Telephone Encounter (Signed)
Patient left another message on nurse line. Stated she was upset she has not heard from Dr Andria Frames and that Keppra should have never been prescribed for her. She wants to know what neurologist she has been referred to.   Attempted to call patient back to explain that Dr Andria Frames is out of the office and a referral has not been placed yet. No answer at [phone].  Danley Danker, RN [facility] RN)

## 2018-08-14 NOTE — Telephone Encounter (Signed)
See my other phone note.

## 2018-08-14 NOTE — Telephone Encounter (Signed)
I called and spoke with Ms Wallner at length.. she is oriented  To person and place--angry about recent events---ED visits and meds. Her case worker Hinton Dyer is there as is her neighbor. I spoke w case worker. I recommended taking her back to ED if she continues to be as confused and agitated. I will route note to her PCP

## 2018-08-15 DIAGNOSIS — F319 Bipolar disorder, unspecified: Secondary | ICD-10-CM | POA: Diagnosis not present

## 2018-08-15 DIAGNOSIS — G609 Hereditary and idiopathic neuropathy, unspecified: Secondary | ICD-10-CM | POA: Diagnosis not present

## 2018-08-15 DIAGNOSIS — G40909 Epilepsy, unspecified, not intractable, without status epilepticus: Secondary | ICD-10-CM | POA: Diagnosis not present

## 2018-08-15 DIAGNOSIS — I110 Hypertensive heart disease with heart failure: Secondary | ICD-10-CM | POA: Diagnosis not present

## 2018-08-15 DIAGNOSIS — M545 Low back pain: Secondary | ICD-10-CM | POA: Diagnosis not present

## 2018-08-15 DIAGNOSIS — I509 Heart failure, unspecified: Secondary | ICD-10-CM | POA: Diagnosis not present

## 2018-08-15 DIAGNOSIS — I4891 Unspecified atrial fibrillation: Secondary | ICD-10-CM | POA: Diagnosis not present

## 2018-08-15 DIAGNOSIS — I2510 Atherosclerotic heart disease of native coronary artery without angina pectoris: Secondary | ICD-10-CM | POA: Diagnosis not present

## 2018-08-15 DIAGNOSIS — G8384 Todd's paralysis (postepileptic): Secondary | ICD-10-CM | POA: Diagnosis not present

## 2018-08-17 ENCOUNTER — Other Ambulatory Visit: Payer: Self-pay | Admitting: Family Medicine

## 2018-08-17 ENCOUNTER — Telehealth: Payer: Self-pay | Admitting: Neurology

## 2018-08-17 MED ORDER — ISOSORBIDE MONONITRATE ER 30 MG PO TB24: 15.0000 mg | ORAL_TABLET | Freq: Every day | ORAL | 3 refills | Status: DC

## 2018-08-17 MED ORDER — FLUOXETINE HCL 40 MG PO CAPS: 40.0000 mg | ORAL_CAPSULE | Freq: Every day | ORAL | 3 refills | Status: DC

## 2018-08-17 MED ORDER — ATORVASTATIN CALCIUM 40 MG PO TABS: ORAL_TABLET | ORAL | 3 refills | Status: DC

## 2018-08-17 NOTE — Telephone Encounter (Signed)
Attempted to reach the pt at number listed [phone] (Preferred). Call would not connect to the pt or vm. Will try again at a later time.   If pt calls back please advise appt would be appropriate and try and schedule. MB RN.

## 2018-08-17 NOTE — Assessment & Plan Note (Signed)
States intollerent of keppra due to worsening anxiety.  She had already been switched back to depakote.

## 2018-08-17 NOTE — Telephone Encounter (Signed)
Called and got update.  She definitely wants to see a neurologist.  She is back on depakote. No seizure activity.

## 2018-08-17 NOTE — Addendum Note (Signed)
Addended by: [name] on: 08/17/2018 03:07 PM   Modules accepted: Orders

## 2018-08-17 NOTE — Telephone Encounter (Signed)
Patient called and stated that she would like to see Dr. Jannifer Franklin regarding a seizure she had in August and December.She states that she saw Dr. Jannifer Franklin for seizures for years and just recently started having seizures again. Please call and advise.

## 2018-08-18 ENCOUNTER — Other Ambulatory Visit: Payer: Self-pay

## 2018-08-18 ENCOUNTER — Encounter: Payer: Self-pay | Admitting: Family Medicine

## 2018-08-18 MED ORDER — ATORVASTATIN CALCIUM 40 MG PO TABS: ORAL_TABLET | ORAL | 3 refills | Status: DC

## 2018-08-18 NOTE — Telephone Encounter (Signed)
I contacted th pt and was able to schedule pt for 09/18/18 at 12 pm check in time of 11:30am MB RN

## 2018-08-18 NOTE — Progress Notes (Signed)
Received fax request from [facility] for lorazepam refill.  Reviewed records.  NOT a long-term medicine.  Started in ER on 08/13/18 - see Dr. Beatriz Chancellor note.  She had anxiety which she now attributes to [place], which has since been stopped.  I do not plan this to be a long term medicine.  Refused request and will delete from her med list.

## 2018-08-20 ENCOUNTER — Telehealth: Payer: Self-pay

## 2018-08-20 ENCOUNTER — Other Ambulatory Visit: Payer: Self-pay

## 2018-08-20 MED ORDER — DIVALPROEX SODIUM 250 MG PO DR TAB: 250.0000 mg | DELAYED_RELEASE_TABLET | Freq: Three times a day (TID) | ORAL | 3 refills | Status: DC

## 2018-08-20 MED ORDER — ISOSORBIDE MONONITRATE ER 30 MG PO TB24: 15.0000 mg | ORAL_TABLET | Freq: Every day | ORAL | 3 refills | Status: DC

## 2018-08-20 MED ORDER — HYDROCHLOROTHIAZIDE 12.5 MG PO CAPS: 12.5000 mg | ORAL_CAPSULE | Freq: Every day | ORAL | 3 refills | Status: DC

## 2018-08-20 NOTE — Telephone Encounter (Signed)
Thurmond Butts, RN with [facility], called with info and need for verbal orders:  Verbal orders for SW evaluation for placement and ST evaluation for cognition.  States patient not safe to live alone. They are seeking these evaluations to help with placement and are putting patient on a 2 week plan to discharge her from their services unless home situation changes.  Call back is [phone]  Danley Danker, RN [facility] RN)

## 2018-08-20 NOTE — Telephone Encounter (Signed)
LM with verbal orders as requested.

## 2018-08-20 NOTE — Telephone Encounter (Signed)
Fax from [facility] requesting 90 day supply. Danley Danker, RN [facility] RN)

## 2018-08-21 ENCOUNTER — Encounter: Payer: Self-pay | Admitting: Family Medicine

## 2018-08-24 ENCOUNTER — Ambulatory Visit (INDEPENDENT_AMBULATORY_CARE_PROVIDER_SITE_OTHER): Payer: Medicare HMO | Admitting: Family Medicine

## 2018-08-24 ENCOUNTER — Encounter: Payer: Self-pay | Admitting: Family Medicine

## 2018-08-24 ENCOUNTER — Other Ambulatory Visit: Payer: Self-pay

## 2018-08-24 DIAGNOSIS — R569 Unspecified convulsions: Secondary | ICD-10-CM

## 2018-08-24 DIAGNOSIS — Z609 Problem related to social environment, unspecified: Secondary | ICD-10-CM | POA: Diagnosis not present

## 2018-08-24 DIAGNOSIS — F339 Major depressive disorder, recurrent, unspecified: Secondary | ICD-10-CM

## 2018-08-24 NOTE — Assessment & Plan Note (Signed)
Remains high risk.  I support her search for assisted living.

## 2018-08-24 NOTE — Progress Notes (Signed)
Established Patient Office Visit  Subjective:  Patient ID: [name], female    DOB: 1930/08/19  Age: 85 y.o. MRN: [id number]  CC:  Chief Complaint  Patient presents with  . Hospitalization Follow-up    HPI [name] presents for FU hospitalization for seizure and CVA.  Also during that hospitalization for found to lack capacity due to (dementia versus psych versus combo of both.)  Not safe to live alone but she is.  No immediate threat to self or others. Neighbor and friend, [name], has worked hard to keep her functioning. BB is improving slowly both physically and mentally since discharge.  She is less tangential and more appropriate today.  Acknowledges that it is not safe for her to live alone and is taking steps to get help and to look for alternative living situations. Has trouble keeping pills straight.  [name] and home health nurse work together to keep pill boxes filled No seizure activity. Now on Depakote for proported Keppra intolerance. Ask for refill on metoprolol.  On short acting despite systolic CHF.Denies DOE, pedal edema or orthopnea.   Past Medical History:  Diagnosis Date  . Anemia   . Asthma    once in 1950's  . AVM (arteriovenous malformation)   . Cancer (HCC)     right leg  skin  . Coronary artery disease   . Depression    sucide attempt in the distant past  . Excessive sweating   . GI bleed   . Hiatal hernia    s/p repair  . HLD (hyperlipidemia)   . HTN (hypertension)   . Hypertension   . Low back pain    ESI by Dr. Nelva Bush 2 weeks ago  . OA (osteoarthritis)    bilateral wrist, bilateral knees  . Obesity   . Obesity   . Ovarian cyst   . Right wrist injury    multiple surgeries and residual weakness.   . Rotator cuff arthropathy    right  . Seizures ([place]) 1980s   2/2 meningioma. none since cranioplasty  . Tuberculosis    + tb test      (father had)    Past Surgical History:  Procedure Laterality Date  . ABDOMINAL  HYSTERECTOMY     left ovaries  . BREAST REDUCTION SURGERY    . CATARACT EXTRACTION Bilateral   . CRANIOPLASTY     2/2 meningioma 1980s  . CYSTECTOMY     rt ovary  age 51   . HIATAL HERNIA REPAIR    . KNEE ARTHROPLASTY     bilat  . LEFT HEART CATHETERIZATION WITH CORONARY ANGIOGRAM N/A 03/26/2012   Procedure: LEFT HEART CATHETERIZATION WITH CORONARY ANGIOGRAM;  Surgeon: Minus Breeding, MD;  Location: [facility] CATH LAB;  Service: Cardiovascular;  Laterality: N/A;  . REVERSE SHOULDER ARTHROPLASTY Right 03/29/2016  . REVERSE SHOULDER ARTHROPLASTY Right 03/29/2016   Procedure: RIGHT REVERSE SHOULDER ARTHROPLASTY;  Surgeon: Netta Cedars, MD;  Location: [facility];  Service: Orthopedics;  Laterality: Right;    Family History  Problem Relation Age of Onset  . Alzheimer's disease Mother   . Heart disease Father        MI 51yo  . Heart disease Brother     Social History   Socioeconomic History  . Marital status: Divorced    Spouse name: Not on file  . Number of children: Not on file  . Years of education: Not on file  . Highest education level: Not on file  Occupational History  .  Occupation: Retired- Health visitor  Social Needs  . Financial resource strain: Not on file  . Food insecurity:    Worry: Not on file    Inability: Not on file  . Transportation needs:    Medical: Not on file    Non-medical: Not on file  Tobacco Use  . Smoking status: Never Smoker  . Smokeless tobacco: Never Used  Substance and Sexual Activity  . Alcohol use: No  . Drug use: No  . Sexual activity: Never  Lifestyle  . Physical activity:    Days per week: Not on file    Minutes per session: Not on file  . Stress: Not on file  Relationships  . Social connections:    Talks on phone: Not on file    Gets together: Not on file    Attends religious service: Not on file    Active member of club or organization: Not on file    Attends meetings of clubs or organizations: Not on file    Relationship status: Not on  file  . Intimate partner violence:    Fear of current or ex partner: Not on file    Emotionally abused: Not on file    Physically abused: Not on file    Forced sexual activity: Not on file  Other Topics Concern  . Not on file  Social History Narrative   Patient identified her friend, [name], as her emergency contact at [phone].   Lives   Caffeine use:     Outpatient Medications Prior to Visit  Medication Sig Dispense Refill  . aspirin 81 MG EC tablet Take 1 tablet (81 mg total) by mouth daily. 30 tablet 0  . atorvastatin (LIPITOR) 40 MG tablet TAKE 1 TABLET BY MOUTH DAILY AT 6:00 PM 90 tablet 3  . Azelaic Acid 15 % cream Apply 1 application topically daily.  2  . calcium-vitamin D (OSCAL WITH D) 500-200 MG-UNIT tablet Take 1 tablet by mouth daily with breakfast.    . Coenzyme Q10 (COQ10) 200 MG CAPS Take 200 mg by mouth daily.     . divalproex (DEPAKOTE) 250 MG DR tablet Take 1 tablet (250 mg total) by mouth 3 (three) times daily. 270 tablet 3  . FLUoxetine (PROZAC) 40 MG capsule Take 1 capsule (40 mg total) by mouth daily. 90 capsule 3  . hydrochlorothiazide (MICROZIDE) 12.5 MG capsule Take 1 capsule (12.5 mg total) by mouth daily. 90 capsule 3  . isosorbide mononitrate (IMDUR) 30 MG 24 hr tablet Take 0.5 tablets (15 mg total) by mouth daily. 45 tablet 3  . latanoprost (XALATAN) 0.005 % ophthalmic solution Place 1 drop into both eyes at bedtime.  11  . magnesium 30 MG tablet Take 30 mg by mouth 2 (two) times daily.     No facility-administered medications prior to visit.     Allergies  Allergen Reactions  . Amoxicillin Anaphylaxis  . Penicillins Anaphylaxis and Swelling    From childhood: Has patient had a PCN reaction causing immediate rash, facial/tongue/throat swelling, SOB or lightheadedness with hypotension: Yes Has patient had a PCN reaction causing severe rash involving mucus membranes or skin necrosis: Unk Has patient had a PCN reaction that required  hospitalization: No Has patient had a PCN reaction occurring within the last 10 years: No If all of the above answers are "NO", then may proceed with Cephalosporin use. .  [name] [Levetiracetam]     Attributes worsening anxiety to keppra.  . Phenobarbital Other (See Comments)  Reaction not recalled by the patient  . Phenytoin Other (See Comments)    "Makes me feel funny"   . Tape Rash    Please use paper tape    ROS Review of Systems    Objective:    Physical Exam  BP (!) 104/48   Pulse 73   Temp 97.9 F (36.6 C) (Oral)   Ht 5\' 5"  (1.651 m)   Wt 176 lb 6.4 oz (80 kg)   SpO2 98%   BMI 29.35 kg/m  Wt Readings from Last 3 Encounters:  08/24/18 176 lb 6.4 oz (80 kg)  08/13/18 170 lb (77.1 kg)  07/30/18 178 lb 12.8 oz (81.1 kg)   Lungs clear Cardiac RRR without m or g Abd benign Ext no edema Psych. Good affect and normal conversation today.  Health Maintenance Due  Topic Date Due  . INFLUENZA VACCINE  03/12/2018    There are no preventive care reminders to display for this patient.  Lab Results  Component Value Date   TSH 0.708 07/17/2018   Lab Results  Component Value Date   WBC 9.5 08/13/2018   HGB 13.1 08/13/2018   HCT 41.2 08/13/2018   MCV 88.2 08/13/2018   PLT 312 08/13/2018   Lab Results  Component Value Date   NA 142 08/13/2018   K 3.6 08/13/2018   CO2 26 08/13/2018   GLUCOSE 117 (H) 08/13/2018   BUN 14 08/13/2018   CREATININE 0.64 08/13/2018   BILITOT 0.3 07/16/2018   ALKPHOS 104 07/16/2018   AST 32 07/16/2018   ALT 18 07/16/2018   PROT 7.0 07/16/2018   ALBUMIN 3.4 (L) 07/16/2018   CALCIUM 9.4 08/13/2018   ANIONGAP 10 08/13/2018   Lab Results  Component Value Date   CHOL 122 07/17/2018   Lab Results  Component Value Date   HDL 44 07/17/2018   Lab Results  Component Value Date   LDLCALC 70 07/17/2018   Lab Results  Component Value Date   TRIG 42 07/17/2018   Lab Results  Component Value Date   CHOLHDL 2.8 07/17/2018    Lab Results  Component Value Date   HGBA1C 5.0 07/17/2018      Assessment & Plan:   Problem List Items Addressed This Visit    None      No orders of the defined types were placed in this encounter.   Follow-up: No follow-ups on file.    [name], MD

## 2018-08-24 NOTE — Patient Instructions (Addendum)
I do not want to change any medications.  I think medically you are stable right now. Keep your appointment with the neurologist. My number one concern is that you stop living alone.  Let me know how I can help get you someplace else.  Assisted living sounds good to me. See me in one month to make sure the chaos does not get bad again.

## 2018-08-24 NOTE — Assessment & Plan Note (Signed)
No seizures reported.  Stay on Depakote.  FU with neuro

## 2018-08-24 NOTE — Assessment & Plan Note (Signed)
Stable today with good affect.

## 2018-08-25 DIAGNOSIS — G609 Hereditary and idiopathic neuropathy, unspecified: Secondary | ICD-10-CM | POA: Diagnosis not present

## 2018-08-25 DIAGNOSIS — G40909 Epilepsy, unspecified, not intractable, without status epilepticus: Secondary | ICD-10-CM | POA: Diagnosis not present

## 2018-08-25 DIAGNOSIS — I2510 Atherosclerotic heart disease of native coronary artery without angina pectoris: Secondary | ICD-10-CM | POA: Diagnosis not present

## 2018-08-25 DIAGNOSIS — F319 Bipolar disorder, unspecified: Secondary | ICD-10-CM | POA: Diagnosis not present

## 2018-08-25 DIAGNOSIS — M545 Low back pain: Secondary | ICD-10-CM | POA: Diagnosis not present

## 2018-08-25 DIAGNOSIS — G8384 Todd's paralysis (postepileptic): Secondary | ICD-10-CM | POA: Diagnosis not present

## 2018-08-25 DIAGNOSIS — I509 Heart failure, unspecified: Secondary | ICD-10-CM | POA: Diagnosis not present

## 2018-08-25 DIAGNOSIS — I4891 Unspecified atrial fibrillation: Secondary | ICD-10-CM | POA: Diagnosis not present

## 2018-08-25 DIAGNOSIS — I110 Hypertensive heart disease with heart failure: Secondary | ICD-10-CM | POA: Diagnosis not present

## 2018-08-26 ENCOUNTER — Encounter (HOSPITAL_BASED_OUTPATIENT_CLINIC_OR_DEPARTMENT_OTHER): Payer: Self-pay

## 2018-08-26 ENCOUNTER — Telehealth: Payer: Self-pay | Admitting: *Deleted

## 2018-08-26 ENCOUNTER — Other Ambulatory Visit: Payer: Self-pay

## 2018-08-26 MED ORDER — FLUOXETINE HCL 40 MG PO CAPS: 40.0000 mg | ORAL_CAPSULE | Freq: Every day | ORAL | 3 refills | Status: DC

## 2018-08-26 MED ORDER — ISOSORBIDE MONONITRATE ER 30 MG PO TB24: 15.0000 mg | ORAL_TABLET | Freq: Every day | ORAL | 3 refills | Status: DC

## 2018-08-26 MED ORDER — ATORVASTATIN CALCIUM 40 MG PO TABS: ORAL_TABLET | ORAL | 3 refills | Status: DC

## 2018-08-26 MED ORDER — HYDROCHLOROTHIAZIDE 12.5 MG PO CAPS: 12.5000 mg | ORAL_CAPSULE | Freq: Every day | ORAL | 3 refills | Status: DC

## 2018-08-26 MED ORDER — DIVALPROEX SODIUM 250 MG PO DR TAB: 250.0000 mg | DELAYED_RELEASE_TABLET | Freq: Three times a day (TID) | ORAL | 3 refills | Status: DC

## 2018-08-26 NOTE — Telephone Encounter (Signed)
Received fax from pharmacy requesting a refill on Lorazepam 0.5mg  tablets. Please advise.

## 2018-08-26 NOTE — Telephone Encounter (Signed)
Minda from [facility] for [facility] verbal orders as follows:  2 time(s) weekly for 3 week(s)  You can leave verbal orders on confidential voicemail.  Yannis Broce, Salome Spotted, CMA

## 2018-08-27 NOTE — Telephone Encounter (Signed)
Verbal order given as requested. 

## 2018-08-31 ENCOUNTER — Telehealth: Payer: Self-pay

## 2018-08-31 DIAGNOSIS — R296 Repeated falls: Secondary | ICD-10-CM

## 2018-08-31 NOTE — Telephone Encounter (Signed)
Contacted pt and gave her the below information and she wanted to be sure that Dr. Andria Frames knew she was ok, she mentioned watching some hospital shows and that she thinks she had "hospital delirium" and she wanted to let Dr. Andria Frames know.  Pt was thankful for the call. Routing to PCP as an Pharmacist, hospital. Katharina Caper, Joselyne Spake D, [place]

## 2018-08-31 NOTE — Telephone Encounter (Signed)
Order entered as requested

## 2018-08-31 NOTE — Addendum Note (Signed)
Addended by: [name] on: 08/31/2018 03:09 PM   Modules accepted: Orders

## 2018-08-31 NOTE — Telephone Encounter (Signed)
Pt called asking Dr Andria Frames to order/referr her to PT for strength and balance. Pt would like to [address] as [address] is close to her house. Please advise. Pt would like a call once this referral has been placed. [phone]. Ottis Stain, CMA

## 2018-09-01 ENCOUNTER — Telehealth: Payer: Self-pay

## 2018-09-01 DIAGNOSIS — I2510 Atherosclerotic heart disease of native coronary artery without angina pectoris: Secondary | ICD-10-CM | POA: Diagnosis not present

## 2018-09-01 DIAGNOSIS — G40909 Epilepsy, unspecified, not intractable, without status epilepticus: Secondary | ICD-10-CM | POA: Diagnosis not present

## 2018-09-01 DIAGNOSIS — M545 Low back pain: Secondary | ICD-10-CM | POA: Diagnosis not present

## 2018-09-01 DIAGNOSIS — I110 Hypertensive heart disease with heart failure: Secondary | ICD-10-CM | POA: Diagnosis not present

## 2018-09-01 DIAGNOSIS — G609 Hereditary and idiopathic neuropathy, unspecified: Secondary | ICD-10-CM | POA: Diagnosis not present

## 2018-09-01 DIAGNOSIS — F319 Bipolar disorder, unspecified: Secondary | ICD-10-CM | POA: Diagnosis not present

## 2018-09-01 DIAGNOSIS — G8384 Todd's paralysis (postepileptic): Secondary | ICD-10-CM | POA: Diagnosis not present

## 2018-09-01 DIAGNOSIS — I509 Heart failure, unspecified: Secondary | ICD-10-CM | POA: Diagnosis not present

## 2018-09-01 DIAGNOSIS — I4891 Unspecified atrial fibrillation: Secondary | ICD-10-CM | POA: Diagnosis not present

## 2018-09-01 NOTE — Telephone Encounter (Signed)
Verbal order given as requested. 

## 2018-09-01 NOTE — Telephone Encounter (Signed)
Vaughan Basta, speech therapist with [facility], called for verbal orders:  Order for [facility] PT evaluation  Call back is [phone]  Danley Danker, RN [facility] RN)

## 2018-09-07 ENCOUNTER — Telehealth: Payer: Self-pay

## 2018-09-07 DIAGNOSIS — F339 Major depressive disorder, recurrent, unspecified: Secondary | ICD-10-CM

## 2018-09-07 MED ORDER — QUETIAPINE FUMARATE 25 MG PO TABS: 25.0000 mg | ORAL_TABLET | Freq: Every day | ORAL | 3 refills | Status: DC

## 2018-09-07 NOTE — Addendum Note (Signed)
Addended by: [name] on: 09/07/2018 03:48 PM   Modules accepted: Orders

## 2018-09-07 NOTE — Telephone Encounter (Signed)
Pt called nurse line stating she is having severe anxiety due to her current medications. Pt states she is taking Depakote and previously [place]. Pt states she feels like both of these medications are causing her, "severe, depilating anxiety." Please call patient, she is requesting something to be called into her mail order pharmacy. I offered her an apt with pcp, "I cant get a ride there."

## 2018-09-07 NOTE — Assessment & Plan Note (Signed)
Will add small dose of seroquiel.

## 2018-09-07 NOTE — Telephone Encounter (Signed)
Call went to voice mail.  I will call her again so that I can speak to her directly about her concerns.

## 2018-09-07 NOTE — Telephone Encounter (Signed)
She is no longer on Keppra. She knows she needs to be on depakote for seizures. I have stopped her short term lorazepam. I will use this as an excuse to add low dose of seroquel.   I continue to have concerns about the safety of her living alone.

## 2018-09-07 NOTE — Telephone Encounter (Signed)
Hinton Dyer., RN with [facility], called nurse line requesting verbal orders to discharge pt from home health. Hinton Dyer stated they go out to her home and help her with her pill box and either the patient refuses to take her meds or she mixes them up on purpose. Hinton Dyer stated, "im not putting my license on the line anymore, she is not an appropriate patient for home health."  Please call Hinton Dyer with [facility] for home health discharge [phone]

## 2018-09-08 ENCOUNTER — Telehealth: Payer: Self-pay

## 2018-09-08 DIAGNOSIS — F339 Major depressive disorder, recurrent, unspecified: Secondary | ICD-10-CM

## 2018-09-08 MED ORDER — QUETIAPINE FUMARATE 25 MG PO TABS: 25.0000 mg | ORAL_TABLET | Freq: Every day | ORAL | 3 refills | Status: DC

## 2018-09-08 NOTE — Telephone Encounter (Signed)
Patient daughter, [name], left message asking for PCP to call her to touch base about patient.  Call back is [phone]  Danley Danker, RN [facility])

## 2018-09-08 NOTE — Telephone Encounter (Signed)
Called and discussed.  Nuanced discussion with the following points. 1. It would be best if patient was in an assisted living facility or living with a family member for 24 hour supervision.  Patient refused that recommendation at last hospitalization.  At present, I am trying to make the best out of a bad situation. 2. I believe the patient continues to need home health so I did not give a verbal order to [facility]. 3. I would understand if the agency discharged her based on their risk management/provider safety protocols. 4. I would also understand if the patient discharged them.  Hinton Dyer will check with her manager and get back to [place] with their plan.

## 2018-09-08 NOTE — Telephone Encounter (Signed)
Again, LM on voice mail when Hinton Dyer did not pick up.

## 2018-09-08 NOTE — Telephone Encounter (Signed)
Spoke to patient and Rx sent. °

## 2018-09-08 NOTE — Progress Notes (Addendum)
Cardiology Office Note   Date:  09/11/2018   ID:  JETTE LEWAN, DOB June 28, 1931, MRN [id number]  PCP:  [name], MD Cardiologist:  Elouise Munroe, MD 07/17/2018, in-hospital Rosaria Ferries, PA-C   No chief complaint on file.   History of Present Illness: [name] is a 85 y.o. female with a history of meningioma status post R craniotomy, SDH with resulting right parietal encephalomalacia, Sz d/o, HTN, HLD, depression, falls and prior suicidal Ideation  Admitted 12/05-12/07/2018 for Sz, Todd's paralysis, sepsis, PNA, trop peak 1.16, seen by cards and EF nl on echo w/ severe TR and PAS 62, CT cardiac w/ mod dz incl dCFX>>med rx  [name] presents for cardiology follow up.  She lives alone, has [facility] daily. She does not cook. She eats mostly frozen foods.  Does okay with the microwave.  Does not drive.  Does not use the stove.  No LE edema, no orthopnea or PND.   She walks with a cane. Does not have to stop for breath, but does not walk very far.    Feels she needs strengthening and improved balance.  Is working with Dr. Andria Frames on this.  She was getting some chest pain with panic attacks, but has not had a panic attack since being started on Seroquel.  She has never had exertional chest pain.  [name] w/ [facility] fills her med boxes weekly.  She says she might sleep in until 10:00 and does not take her medications until after she wakes up.  She then says she cannot take them but so close together.  She denies missing doses, but this is not at all clear.   Past Medical History:  Diagnosis Date  . Anemia   . Asthma    once in 1950's  . AVM (arteriovenous malformation)   . Cancer (HCC)     right leg  skin  . Coronary artery disease   . Depression    sucide attempt in the distant past  . Excessive sweating   . GI bleed   . Hiatal hernia    s/p repair  . HLD (hyperlipidemia)   . HTN (hypertension)   . Hypertension   . Low back pain     ESI by Dr. Nelva Bush 2 weeks ago  . OA (osteoarthritis)    bilateral wrist, bilateral knees  . Obesity   . Obesity   . Ovarian cyst   . Right wrist injury    multiple surgeries and residual weakness.   . Rotator cuff arthropathy    right  . Seizures ([place]) 1980s   2/2 meningioma. none since cranioplasty  . Tuberculosis    + tb test      (father had)    Past Surgical History:  Procedure Laterality Date  . ABDOMINAL HYSTERECTOMY     left ovaries  . BREAST REDUCTION SURGERY    . CATARACT EXTRACTION Bilateral   . CRANIOPLASTY     2/2 meningioma 1980s  . CYSTECTOMY     rt ovary  age 12   . HIATAL HERNIA REPAIR    . KNEE ARTHROPLASTY     bilat  . LEFT HEART CATHETERIZATION WITH CORONARY ANGIOGRAM N/A 03/26/2012   Procedure: LEFT HEART CATHETERIZATION WITH CORONARY ANGIOGRAM;  Surgeon: Minus Breeding, MD;  Location: [facility] CATH LAB;  Service: Cardiovascular;  Laterality: N/A;  . REVERSE SHOULDER ARTHROPLASTY Right 03/29/2016  . REVERSE SHOULDER ARTHROPLASTY Right 03/29/2016   Procedure: RIGHT REVERSE SHOULDER ARTHROPLASTY;  Surgeon: Netta Cedars, MD;  Location: [place];  Service: Orthopedics;  Laterality: Right;    Current Outpatient Medications  Medication Sig Dispense Refill  . aspirin 81 MG EC tablet Take 1 tablet (81 mg total) by mouth daily. 30 tablet 0  . atorvastatin (LIPITOR) 40 MG tablet TAKE 1 TABLET BY MOUTH DAILY AT 6:00 PM 90 tablet 3  . Azelaic Acid 15 % cream Apply 1 application topically daily.  2  . calcium-vitamin D (OSCAL WITH D) 500-200 MG-UNIT tablet Take 1 tablet by mouth daily with breakfast.    . Coenzyme Q10 (COQ10) 200 MG CAPS Take 200 mg by mouth daily.     . divalproex (DEPAKOTE) 250 MG DR tablet Take 1 tablet (250 mg total) by mouth 3 (three) times daily. 270 tablet 3  . FLUoxetine (PROZAC) 40 MG capsule Take 1 capsule (40 mg total) by mouth daily. 90 capsule 3  . hydrochlorothiazide (MICROZIDE) 12.5 MG capsule Take 1 capsule (12.5 mg total) by mouth daily.  90 capsule 3  . isosorbide mononitrate (IMDUR) 30 MG 24 hr tablet Take 0.5 tablets (15 mg total) by mouth daily for 30 days. 45 tablet 3  . magnesium 30 MG tablet Take 30 mg by mouth 2 (two) times daily.    . QUEtiapine (SEROQUEL) 25 MG tablet Take 1 tablet (25 mg total) by mouth at bedtime. 30 tablet 3   No current facility-administered medications for this visit.     Allergies:   Amoxicillin; Penicillins; Keppra [levetiracetam]; Phenobarbital; Phenytoin; and Tape    Social History:  The patient  reports that she has never smoked. She has never used smokeless tobacco. She reports that she does not drink alcohol or use drugs.   Family History:  The patient's family history includes Alzheimer's disease in her mother; Heart disease in her brother and father.  She indicated that her mother is deceased. She indicated that her father is deceased. She indicated that the status of her brother is unknown.   ROS:  Please see the history of present illness. All other systems are reviewed and negative.    PHYSICAL EXAM: VS:  BP 123/76   Pulse 83   Ht 5\' 5"  (1.651 m)   Wt 173 lb 6.4 oz (78.7 kg)   SpO2 95%   BMI 28.86 kg/m  , BMI Body mass index is 28.86 kg/m. GEN: Well nourished, well developed, elderly female in no acute distress HEENT: normal for age  Neck: no JVD, murmur radiates to both carotids (L>R), no masses Cardiac: RRR; 3/6 murmur, no rubs, or gallops Respiratory:  clear to auscultation bilaterally, normal work of breathing GI: soft, nontender, nondistended, + BS MS: no deformity or atrophy; no edema; distal pulses are 2+ in all 4 extremities  Skin: warm and dry, no rash Neuro:  Strength and sensation are intact Psych: euthymic mood, full affect  Ambulation: Patient was ambulated in the office because of her complaints of being lightheaded and dizzy and getting weak with ambulation.  She ambulated over 200 feet and had no complaints of dizziness.  She was fatigued, but no chest  pain and her O2 saturation remained stable.  Sitting BP 143/76, standing BP 131/81, after ambulation, BP 130/76.     EKG:  EKG is not ordered today.   ECHO: 07/18/2018 - Left ventricle: The cavity size was normal. There was moderate   hyperterophy of the basal septum and otherwise mild concentric   LVH. Systolic function was normal. The estimated ejection  fraction was in the range of 60% to 65%. Wall motion was normal;   there were no regional wall motion abnormalities. Doppler   parameters are consistent with abnormal left ventricular   relaxation (grade 1 diastolic dysfunction). Doppler parameters   are consistent with high ventricular filling pressure. - Aortic valve: Valve mobility was restricted. Transvalvular   velocity was within the normal range. There was no stenosis.   There was no regurgitation. - Mitral valve: Calcified annulus. Transvalvular velocity was   within the normal range. There was no evidence for stenosis.   There was mild regurgitation. - Left atrium: The atrium was severely dilated. - Right ventricle: The cavity size was normal. Wall thickness was   normal. Systolic function was normal. - Tricuspid valve: There was severe regurgitation. - Pulmonary arteries: Systolic pressure was severely increased. PA   peak pressure: 62 mm Hg (S).   CARDIAC CT: 07/20/2018 Coronary Arteries:  Normal coronary origin.  Right dominance.  RCA is a large dominant artery that gives rise to PDA and PLVB. There is minimal (1-24%) calcified plaque in the proximal and mid RCA.  Left main is a large artery that gives rise to LAD and LCX arteries.  LAD is a large vessel that gives rise to two diagonal branches. There is minimal (1-24%) calcified plaque in the proximal to mid LAD. There is moderate (50-69%) calcified plaque in proximal D2. This may be overestimated due to blooming artifact. There is minimal calcified plaque in D1.  LCX is a non-dominant artery that  gives rise to one large OM1 branch. There is diffuse, non-obstructing calcified plaque in proximal to mid LCX.  Other findings:  Normal pulmonary vein drainage into the left atrium.  Normal let atrial appendage without a thrombus.  Normal size of the pulmonary artery.  Small PFO noted.  IMPRESSION: 1. Coronary calcium score of 641. Age and sex matched comparison is only validated through age 43. Assuming age 71, this was 79th percentile for age and sex matched control.  2. Normal coronary origin with right dominance.  3. Diffuse calcified plaque with possible obstructive disease in the LAD territory.  4.  Will send study for FFRct.  1. Left Main:  No significant stenosis.  FFRct 0.97  2. LAD: No significant stenosis. FFRct proximal 0.95, distall 0.97. D2 proximal 0.93, distal 0.80 3. LCX: FFRct 0.95 proximal, 0.85 mid, 0.67 distal. 4. RCA: No significant stenosis.  FFRct 0.99 proximal, 0.94 distal.  IMPRESSION: 1. CT FFR analysis showed significant disease in the distal LCX. This is a small vessel not amenable to stenting. Recommend medical management.  2.  No obstructive CAD in the LAD or RCA territories.  3. Recommendation aggressive medical management with aspirin and high potency statin.   Recent Labs: 07/16/2018: ALT 18; B Natriuretic Peptide 593.3 07/17/2018: TSH 0.708 07/19/2018: Magnesium 1.9 08/13/2018: BUN 14; Creatinine, Ser 0.64; Hemoglobin 13.1; Platelets 312; Potassium 3.6; Sodium 142  CBC    Component Value Date/Time   WBC 9.5 08/13/2018 1139   RBC 4.67 08/13/2018 1139   HGB 13.1 08/13/2018 1139   HGB 12.9 12/25/2017 1241   HCT 41.2 08/13/2018 1139   HCT 39.8 12/25/2017 1241   PLT 312 08/13/2018 1139   PLT 372 12/25/2017 1241   MCV 88.2 08/13/2018 1139   MCV 86 12/25/2017 1241   MCH 28.1 08/13/2018 1139   MCHC 31.8 08/13/2018 1139   RDW 13.6 08/13/2018 1139   RDW 14.5 12/25/2017 1241   LYMPHSABS 1.9 08/13/2018 1139  MONOABS  0.9 08/13/2018 1139   EOSABS 0.1 08/13/2018 1139   BASOSABS 0.1 08/13/2018 1139   CMP Latest Ref Rng & Units 08/13/2018 07/23/2018 07/22/2018  Glucose 70 - 99 mg/dL 117(H) 96 100(H)  BUN 8 - 23 mg/dL 14 16 20   Creatinine 0.44 - 1.00 mg/dL 0.64 0.69 0.75  Sodium 135 - 145 mmol/L 142 141 139  Potassium 3.5 - 5.1 mmol/L 3.6 3.6 4.0  Chloride 98 - 111 mmol/L 106 108 109  CO2 22 - 32 mmol/L 26 24 22   Calcium 8.9 - 10.3 mg/dL 9.4 8.7(L) 8.6(L)  Total Protein 6.5 - 8.1 g/dL - - -  Total Bilirubin 0.3 - 1.2 mg/dL - - -  Alkaline Phos 38 - 126 U/L - - -  AST 15 - 41 U/L - - -  ALT 0 - 44 U/L - - -     Lipid Panel Lab Results  Component Value Date   CHOL 122 07/17/2018   HDL 44 07/17/2018   LDLCALC 70 07/17/2018   TRIG 42 07/17/2018   CHOLHDL 2.8 07/17/2018      Wt Readings from Last 3 Encounters:  09/11/18 173 lb 6.4 oz (78.7 kg)  08/24/18 176 lb 6.4 oz (80 kg)  08/13/18 170 lb (77.1 kg)     Other studies Reviewed: Additional studies/ records that were reviewed today include: PCP office notes, hospital records and testing.  ASSESSMENT AND PLAN:  1.  CAD: She is having no ischemic symptoms.  She is asymptomatic on ASA, high-dose statin, low-dose Imdur.  She would benefit from being on a beta-blocker, but I will want to make sure she is on a consistent medication regimen before I try to add anything.  2.  Hypertension: Her blood pressure is minimally above target today.  I am concerned about her episodes of dizziness and wonder if she is orthostatic.  She did have a mild blood pressure drop in the office today, but was asymptomatic.  3.  Medication compliance: It is not at all clear that she was taking her medications as prescribed. I spoke with [name] at [facility].  She stated that the patient's med boxes were filled by them for the first time a few days ago.  The pillboxes should last her for 2 weeks.  I reinforced the need for someone to help the patient remember which  medicines to take when and emphasized the importance of medication compliance.  [name] stated that she would do what she could to make sure they are able to keep filling the pillboxes, but felt like that could happen.  4.  Abnormal echo, severe tricuspid regurgitation: I do not believe she is having any symptoms related to this.  Her activity level is poor, but she does not seem to be limited by shortness of breath.  Continue to follow.  5. Social issues:**She states she is willing to go to Assisted Living**. - One of the reasons she is willing to do this is social.  She states she would look forward to being around other people and considers herself a social creature. - She also responded favorably to discussion about not having to cook any meals, and not having to do any housework. - Will forward this information to Dr. Andria Frames to address. - However, she is clear that she does not want to go to [place].   Current medicines are reviewed at length with the patient today.  The patient does not have concerns regarding medicines.  The following changes have  been made:  no change  Labs/ tests ordered today include:  No orders of the defined types were placed in this encounter.    Disposition:   FU with Elouise Munroe, MD  Signed, Rosaria Ferries, PA-C  09/11/2018 9:26 AM    [place] Phone: [phone]; Fax: [phone]

## 2018-09-08 NOTE — Telephone Encounter (Signed)
Patient left message that she spoke directly with PCP yesterday about an anxiety medication. Requests that it be sent to [facility] instead of [facility].  Danley Danker, RN [facility] RN)

## 2018-09-09 NOTE — Telephone Encounter (Signed)
Called and discussed.  She is fully aware that her more is difficult and non compliant.  She is aware that it is not safe for her mom to live alone.

## 2018-09-09 NOTE — Telephone Encounter (Signed)
Attempted to call.  LM that I will try again.

## 2018-09-11 ENCOUNTER — Encounter: Payer: Self-pay | Admitting: Physician Assistant

## 2018-09-11 ENCOUNTER — Ambulatory Visit (INDEPENDENT_AMBULATORY_CARE_PROVIDER_SITE_OTHER): Payer: Medicare HMO | Admitting: Physician Assistant

## 2018-09-11 VITALS — BP 123/76 | HR 83 | Ht 65.0 in | Wt 173.4 lb

## 2018-09-11 DIAGNOSIS — I071 Rheumatic tricuspid insufficiency: Secondary | ICD-10-CM | POA: Diagnosis not present

## 2018-09-11 DIAGNOSIS — I2510 Atherosclerotic heart disease of native coronary artery without angina pectoris: Secondary | ICD-10-CM

## 2018-09-11 DIAGNOSIS — I10 Essential (primary) hypertension: Secondary | ICD-10-CM

## 2018-09-11 DIAGNOSIS — Z9189 Other specified personal risk factors, not elsewhere classified: Secondary | ICD-10-CM

## 2018-09-11 NOTE — Patient Instructions (Signed)
Follow-Up: You will need a follow up appointment in 6 months.  Please call our office 2  (June 2020) months in advance to schedule this  (AUGUST 2020) appointment.  You may see Elouise Munroe, MD or one of the following Advanced Practice Providers on your designated Care Team:  Rosaria Ferries, PA-C Jory Sims, DNP, AACC      Medication Instructions:  NO CHANGES- Your physician recommends that you continue on your current medications as directed. Please refer to the Current Medication list given to you today. If you need a refill on your cardiac medications before your next appointment, please call your pharmacy. Labwork: When you have labs (blood work) and your tests are completely normal, you will receive your results ONLY by [place] (if you have MyChart) -OR- A paper copy in the mail.  At [facility], you and your health needs are our priority.  As part of our continuing mission to provide you with exceptional heart care, we have created designated Provider Care Teams.  These Care Teams include your primary Cardiologist (physician) and Advanced Practice Providers (APPs -  Physician Assistants and Nurse Practitioners) who all work together to provide you with the care you need, when you need it.  Thank you for choosing [facility]!!

## 2018-09-15 ENCOUNTER — Telehealth: Payer: Self-pay

## 2018-09-15 NOTE — Telephone Encounter (Signed)
Pt called nurse line stating she would like some assisted living resources mailed to her address. Address confirmed with patient. Will forward to PCP and Neoma Laming.

## 2018-09-16 ENCOUNTER — Telehealth: Payer: Self-pay | Admitting: *Deleted

## 2018-09-16 ENCOUNTER — Telehealth: Payer: Self-pay | Admitting: Licensed Clinical Social Worker

## 2018-09-16 NOTE — Telephone Encounter (Signed)
  LCSW received consult from [place] reference assisting patient with facility placement process. Called patient to assess need. Patient states she is watching the impeachment vote and wanted LCSW to call back.   Plan: LCSW will contact patient in 1 to 2 days.  Casimer Lanius, LCSW [facility]   [phone] 4:16 PM

## 2018-09-16 NOTE — Telephone Encounter (Signed)
Called patient. She reports people are still coming to her house regularly - Good:  I thought that she had been dismissed by her home health agency.  She remains disorganized in her thoughts.  Example: At one point she said she doesn't need people coming to her house.  In the next breath she said her only problem is that she can't stand being alone all day.    I do believe that assisted living is the best long-term option for her.  Casimer Lanius, [place] will assist as she can. I encouraged her to not discontinue any home support she is currently receiving.  She agreed to hold off until she sees me.

## 2018-09-16 NOTE — Telephone Encounter (Signed)
Patient calls requesting a referral to outpatient rehab to help with her "balance".  I asked if home health PT comes out and she states "I dont remember but someone come out everyday but that not what I need. I need help with my balance."  She would like for me to "run it by Dr. Andria Frames". [place], Salome Spotted, CMA

## 2018-09-18 ENCOUNTER — Encounter: Payer: Self-pay | Admitting: Neurology

## 2018-09-18 ENCOUNTER — Encounter

## 2018-09-18 ENCOUNTER — Ambulatory Visit (INDEPENDENT_AMBULATORY_CARE_PROVIDER_SITE_OTHER): Payer: Medicare HMO | Admitting: Neurology

## 2018-09-18 VITALS — BP 161/76 | HR 67 | Ht 65.0 in | Wt 179.0 lb

## 2018-09-18 DIAGNOSIS — R569 Unspecified convulsions: Secondary | ICD-10-CM

## 2018-09-18 DIAGNOSIS — R296 Repeated falls: Secondary | ICD-10-CM | POA: Diagnosis not present

## 2018-09-18 MED ORDER — DIVALPROEX SODIUM 500 MG PO DR TAB: 500.0000 mg | DELAYED_RELEASE_TABLET | Freq: Two times a day (BID) | ORAL | 1 refills | Status: DC

## 2018-09-18 NOTE — Progress Notes (Signed)
Reason for visit: Seizures  Referring physician: [facility]  [name] is a 85 y.o. female  History of present illness:  [name] is an 85 year old right-handed white female with a history of a right parietal meningioma resection in the past, the patient has encephalomalacia associated with this.  The patient had been on seizure medications prior to the surgical resection of the meningioma and for some time afterwards but she was able to come off the medications, she was driving a motor vehicle.  In December 2018 she fell and sustained a head injury with a small subdural hematoma that did not require surgical intervention.  She claims that she fell again in May 2019 and hit her head.  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  The patient was placed back on Keppra but this resulted in severe anxiety, and the patient was taken off of this and placed on Depakote.  The patient is not operating motor vehicle, she lives alone currently, she is thinking about making a transition to an extended care facility with assisted living.  The patient has reported a decrease in visual acuity in the right eye since the seizure, she has not yet seen her ophthalmologist.  The patient will be entering physical therapy in the near future.  She reports no new numbness or weakness of the face, arms, legs.  She does have some gait instability, she does not use a cane or a walker.  She denies issues controlling the bowels or the bladder.  She had no warning with the most recent seizure.  Past Medical History:  Diagnosis Date  . Anemia   . Asthma    once in 1950's  . AVM (arteriovenous malformation)   . Cancer (HCC)     right leg  skin  . Coronary artery disease   . Depression    sucide attempt in the distant past  . Excessive sweating   . GI bleed   . Hiatal hernia     s/p repair  . HLD (hyperlipidemia)   . HTN (hypertension)   . Hypertension   . Low back pain    ESI by Dr. Nelva Bush 2 weeks ago  . OA (osteoarthritis)    bilateral wrist, bilateral knees  . Obesity   . Obesity   . Ovarian cyst   . Right wrist injury    multiple surgeries and residual weakness.   . Rotator cuff arthropathy    right  . Seizures ([place]) 1980s   2/2 meningioma. none since cranioplasty  . Tuberculosis    + tb test      (father had)    Past Surgical History:  Procedure Laterality Date  . ABDOMINAL HYSTERECTOMY     left ovaries  . BREAST REDUCTION SURGERY    . CATARACT EXTRACTION Bilateral   . CRANIOPLASTY     2/2 meningioma 1980s  . CYSTECTOMY     rt ovary  age 68   . HIATAL HERNIA REPAIR    . KNEE ARTHROPLASTY     bilat  . LEFT HEART CATHETERIZATION WITH CORONARY ANGIOGRAM N/A 03/26/2012   Procedure: LEFT HEART CATHETERIZATION WITH CORONARY ANGIOGRAM;  Surgeon: Minus Breeding, MD;  Location: [facility] CATH LAB;  Service: Cardiovascular;  Laterality: N/A;  . REVERSE SHOULDER ARTHROPLASTY Right 03/29/2016  . REVERSE SHOULDER ARTHROPLASTY Right 03/29/2016   Procedure: RIGHT  REVERSE SHOULDER ARTHROPLASTY;  Surgeon: Netta Cedars, MD;  Location: [facility];  Service: Orthopedics;  Laterality: Right;    Family History  Problem Relation Age of Onset  . Alzheimer's disease Mother   . Heart disease Father        MI 13yo  . Heart disease Brother     Social history:  reports that she has never smoked. She has never used smokeless tobacco. She reports that she does not drink alcohol or use drugs.  Medications:  Prior to Admission medications   Medication Sig Start Date End Date Taking? Authorizing Provider  aspirin 81 MG EC tablet Take 1 tablet (81 mg total) by mouth daily. 07/23/18   Benay Pike, MD  atorvastatin (LIPITOR) 40 MG tablet TAKE 1 TABLET BY MOUTH DAILY AT 6:00 PM 08/26/18   [name], MD  Azelaic Acid 15 % cream Apply 1 application topically daily.  04/08/18   [provider]  calcium-vitamin D (OSCAL WITH D) 500-200 MG-UNIT tablet Take 1 tablet by mouth daily with breakfast. 06/15/18   [name], MD  Coenzyme Q10 (COQ10) 200 MG CAPS Take 200 mg by mouth daily.     [provider]  divalproex (DEPAKOTE) 250 MG DR tablet Take 1 tablet (250 mg total) by mouth 3 (three) times daily. 08/26/18   [name], MD  FLUoxetine (PROZAC) 40 MG capsule Take 1 capsule (40 mg total) by mouth daily. 08/26/18   [name], MD  hydrochlorothiazide (MICROZIDE) 12.5 MG capsule Take 1 capsule (12.5 mg total) by mouth daily. 08/26/18   [name], MD  isosorbide mononitrate (IMDUR) 30 MG 24 hr tablet Take 0.5 tablets (15 mg total) by mouth daily for 30 days. 08/26/18 09/25/18  [name], MD  magnesium 30 MG tablet Take 30 mg by mouth 2 (two) times daily.    [provider]  QUEtiapine (SEROQUEL) 25 MG tablet Take 1 tablet (25 mg total) by mouth at bedtime. 09/08/18   [name], MD      Allergies  Allergen Reactions  . Amoxicillin Anaphylaxis  . Penicillins Anaphylaxis and Swelling    From childhood: Has patient had a PCN reaction causing immediate rash, facial/tongue/throat swelling, SOB or lightheadedness with hypotension: Yes Has patient had a PCN reaction causing severe rash involving mucus membranes or skin necrosis: Unk Has patient had a PCN reaction that required hospitalization: No Has patient had a PCN reaction occurring within the last 10 years: No If all of the above answers are "NO", then may proceed with Cephalosporin use. .  [name] [Levetiracetam]     Attributes worsening anxiety to keppra.  . Phenobarbital Other (See Comments)    Reaction not recalled by the patient  . Phenytoin Other (See Comments)    "Makes me feel funny"   . Tape Rash    Please use paper tape    ROS:  Out of a complete 14 system review of symptoms, the patient complains only of the following  symptoms, and all other reviewed systems are negative.  Seizures, dizziness  Blood pressure (!) 161/76, pulse 67, height 5\' 5"  (1.651 m), weight 179 lb (81.2 kg).  Physical Exam  General: The patient is alert and cooperative at the time of the examination.  Eyes: Pupils are equal, round, and reactive to light. Discs are flat bilaterally.  Neck: The neck is supple, no carotid bruits are noted.  Respiratory: The respiratory examination is clear.  Cardiovascular: The cardiovascular  examination reveals a regular rate and rhythm, a grade III/VI systolic ejection murmur in the left lower sternal border was noted.  Skin: Extremities are without significant edema.  Neurologic Exam  Mental status: The patient is alert and oriented x 3 at the time of the examination. The patient has apparent normal recent and remote memory, with an apparently normal attention span and concentration ability.  Cranial nerves: Facial symmetry is not present.  Ptosis is seen of the right eye.  There is good sensation of the face to pinprick and soft touch bilaterally. The strength of the facial muscles and the muscles to head turning and shoulder shrug are normal bilaterally. Speech is well enunciated, no aphasia or dysarthria is noted. Extraocular movements are full. Visual fields are full. The tongue is midline, and the patient has symmetric elevation of the soft palate. No obvious hearing deficits are noted.  Motor: The motor testing reveals 5 over 5 strength of all 4 extremities, with exception of some weakness of the intrinsic muscles of the right hand. Good symmetric motor tone is noted throughout.  Sensory: Sensory testing is intact to pinprick, soft touch, vibration sensation, and position sense on all 4 extremities, with exception of decreased pinprick sensation on the anterolateral aspect of the left leg below the knee and dorsum of the foot, decreased vibration sensation position sensation on the left foot.  No evidence of extinction is noted.  Coordination: Cerebellar testing reveals good finger-nose-finger and heel-to-shin bilaterally.  Gait and station: Gait is slightly wide-based, the patient can walk independently. Tandem could not be performed, Romberg is positive, the patient tends to lean backwards. No drift is seen.  Reflexes: Deep tendon reflexes are symmetric and normal bilaterally. Toes are downgoing bilaterally.   07/17/18 MRI brain:  IMPRESSION: 1. Increased diffusion abnormality involving the mesial right temporal lobe/hippocampus. Given patient history, findings favored to reflect the sequelae of acute seizure/status epilepticus. Correlation with EEG recommended. Differential considerations include sequelae of acute ischemia or herpes encephalitis. Correlation with CSF analysis recommended as warranted. 2. Punctate 4 mm focus of diffusion abnormality within the inferior right cerebellum, suggestive of a small concomitant acute to early subacute ischemic nonhemorrhagic infarct. 3. Prior right parieto-occipital cranioplasty with underlying Encephalomalacia.  * MRI scan images were reviewed online. I agree with the written report.    Assessment/Plan:  1.  History of right parietal meningioma resection  2.  Gait disturbance  3.  Multiple falls with head injury, prior subdural hematoma  4.  Seizure with associated Todd's paralysis  The patient is back on Depakote, will go up on the dose taking 500 mg twice daily.  The patient will follow-up in about 4 months, we will check blood work then.  The patient is not to operate a motor vehicle until further notice.  The patient will be undergoing some physical therapy, she will be seeing her ophthalmologist for her decreased vision in the right eye.  She will call us if any recurrence of the seizures is noted.  A prescription was sent in for the Depakote.  Jill Alexanders MD 09/18/2018 12:04 PM  [facility] [address] [place], [place] [address]  Phone [phone] Fax [phone]

## 2018-09-18 NOTE — Telephone Encounter (Signed)
  F/U call to patient reference consult from PCP to provided information on facility placement. LCSW spoke with patient to assess needs.  Patient lives alone, received medicaid, has a limited support system; Daughter lives out of state.  Primary local support is her neighbor who assist with transporting to appointments.   Patient stated "I think it's about time I starting looking into assisted living".   The following was discussed : transportation options with [facility], time frame for placement, possible barriers to moving forward with placement and review of the placement process. Patient appreciative of call.  Update sent to provider.  Intervention: Supportive Counseling, [employer], education,  Plan:  1. LCSW will mail facility information packet to patient  2. Patient will call LCSW with questions 3. LCSW will F/U with patient after visit with PCP  Casimer Lanius, [place]   [phone] 1:54 PM

## 2018-09-22 NOTE — Telephone Encounter (Signed)
Appreciate great team support

## 2018-09-23 ENCOUNTER — Telehealth: Payer: Self-pay | Admitting: Licensed Clinical Social Worker

## 2018-09-23 ENCOUNTER — Encounter: Payer: Self-pay | Admitting: Licensed Clinical Social Worker

## 2018-09-23 NOTE — Telephone Encounter (Signed)
LCSW received phone call from patient requesting information on meals on wheels.  Intervention:  Link to [employer] for [employer].  Patient understands there is a wail list and may take up to 6 months to start receiving services.  Plan:  [name] will F/U with patient in 7 to 10 ref. Meals on Wheels referral   [place], [place]   [phone] 12:33 PM

## 2018-09-23 NOTE — BH Specialist Note (Signed)
  LCSW received phone call from patient requesting information on meals on wheels. LCSW assessed needs and barriers.  Patient lives alone and is having difficulty with preparing meals.  She is currently relying on neighbors to bring her food and eating frozen meals. LCSW explained [facility] referral process and reviewed consent.  Verbal consent provided by patient. Patient also states she received packet of information mailed about facility placement.    Issues discussed: support system, community resource options,  how patient is currently managing and has managed in the past    Intervention:  Link to [employer] for [employer].  Patient understands there is a wail list and may take up to 6 months to start receiving services.   Plan:  [place] will F/U with patient in 7 to 10 ref. Meals on Wheels referral and to answer questions about facility placement.   Casimer Lanius, [facility]   [phone] 12:33 PM

## 2018-09-25 ENCOUNTER — Telehealth: Payer: Self-pay | Admitting: Neurology

## 2018-09-25 NOTE — Telephone Encounter (Signed)
Patient wants to make sure that we are aware that all her medications are sent to [facility]. No need to return call. DW

## 2018-09-25 NOTE — Telephone Encounter (Signed)
Noted. [facility] listed as preferred pharmacy.

## 2018-09-29 ENCOUNTER — Ambulatory Visit: Payer: Medicare HMO | Attending: Family Medicine | Admitting: Physical Therapy

## 2018-09-30 ENCOUNTER — Other Ambulatory Visit: Payer: Self-pay

## 2018-09-30 ENCOUNTER — Encounter: Payer: Self-pay | Admitting: Family Medicine

## 2018-09-30 ENCOUNTER — Ambulatory Visit (INDEPENDENT_AMBULATORY_CARE_PROVIDER_SITE_OTHER): Payer: Medicare HMO | Admitting: Family Medicine

## 2018-09-30 DIAGNOSIS — Z515 Encounter for palliative care: Secondary | ICD-10-CM

## 2018-09-30 DIAGNOSIS — I10 Essential (primary) hypertension: Secondary | ICD-10-CM | POA: Diagnosis not present

## 2018-09-30 DIAGNOSIS — R569 Unspecified convulsions: Secondary | ICD-10-CM

## 2018-09-30 DIAGNOSIS — Z609 Problem related to social environment, unspecified: Secondary | ICD-10-CM | POA: Diagnosis not present

## 2018-09-30 DIAGNOSIS — F339 Major depressive disorder, recurrent, unspecified: Secondary | ICD-10-CM | POA: Diagnosis not present

## 2018-09-30 NOTE — Patient Instructions (Addendum)
If the meals on wheels call does not work out, call 211.  211 is the [employer] and help you get in contact with any [place] [address] support agency. I do think more support at home would be good.

## 2018-10-01 ENCOUNTER — Encounter: Payer: Self-pay | Admitting: Family Medicine

## 2018-10-01 NOTE — Progress Notes (Signed)
Established Patient Office Visit  Subjective:  Patient ID: [name], female    DOB: July 20, 1931  Age: 85 y.o. MRN: [id number]  CC: No chief complaint on file.   HPI [name] presents for follow up hospitalization/high risk situation.  Please see previous notes.  [name] remains worrisome.  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  Heavy sigh.   [name] (concerned neighbor) accompanies BB.  Objectively doing well.  States she does not eat much (but weight is stable.)  Neighbor fills pill box.  No falls per patient.  States she is looking into assisted living - I am not sure how hard.  Also states that it is highly unlikely that she will move to [place] to live with daughers.  No seizures.  Has been seen by neuro.  Past Medical History:  Diagnosis Date  . Anemia   . Asthma    once in 1950's  . AVM (arteriovenous malformation)   . Cancer (HCC)     right leg  skin  . Coronary artery disease   . Depression    sucide attempt in the distant past  . Excessive sweating   . GI bleed   . Hiatal hernia    s/p repair  . HLD (hyperlipidemia)   . HTN (hypertension)   . Hypertension   . Low back pain    ESI by Dr. Nelva Bush 2 weeks ago  . OA (osteoarthritis)    bilateral wrist, bilateral knees  . Obesity   . Obesity   . Ovarian cyst   . Right wrist injury    multiple surgeries and residual weakness.   . Rotator cuff arthropathy    right  . Seizures ([place]) 1980s   2/2 meningioma. none since cranioplasty  . Tuberculosis    + tb test      (father had)    Past Surgical History:  Procedure Laterality Date  . ABDOMINAL HYSTERECTOMY     left ovaries  . BREAST REDUCTION SURGERY    . CATARACT EXTRACTION Bilateral   . CRANIOPLASTY     2/2 meningioma 1980s  . CYSTECTOMY     rt ovary  age 89   . HIATAL HERNIA REPAIR    . KNEE ARTHROPLASTY     bilat  . LEFT HEART CATHETERIZATION WITH CORONARY ANGIOGRAM N/A 03/26/2012   Procedure: LEFT HEART  CATHETERIZATION WITH CORONARY ANGIOGRAM;  Surgeon: Minus Breeding, MD;  Location: [facility] CATH LAB;  Service: Cardiovascular;  Laterality: N/A;  . REVERSE SHOULDER ARTHROPLASTY Right 03/29/2016  . REVERSE SHOULDER ARTHROPLASTY Right 03/29/2016   Procedure: RIGHT REVERSE SHOULDER ARTHROPLASTY;  Surgeon: Netta Cedars, MD;  Location: [facility];  Service: Orthopedics;  Laterality: Right;    Family History  Problem Relation Age of Onset  . Alzheimer's disease Mother   . Heart disease Father        MI 28yo  . Heart disease Brother     Social History   Socioeconomic History  . Marital status: Divorced    Spouse name: Not on file  . Number of children: Not on file  . Years of education: Not on file  . Highest education level: Not on file  Occupational History  . Occupation: Retired- Health visitor  Social Needs  . Financial resource strain: Not on file  . Food insecurity:    Worry: Not on file    Inability: Not on file  . Transportation needs:    Medical: Not on file  Non-medical: Not on file  Tobacco Use  . Smoking status: Never Smoker  . Smokeless tobacco: Never Used  Substance and Sexual Activity  . Alcohol use: No  . Drug use: No  . Sexual activity: Never  Lifestyle  . Physical activity:    Days per week: Not on file    Minutes per session: Not on file  . Stress: Not on file  Relationships  . Social connections:    Talks on phone: Not on file    Gets together: Not on file    Attends religious service: Not on file    Active member of club or organization: Not on file    Attends meetings of clubs or organizations: Not on file    Relationship status: Not on file  . Intimate partner violence:    Fear of current or ex partner: Not on file    Emotionally abused: Not on file    Physically abused: Not on file    Forced sexual activity: Not on file  Other Topics Concern  . Not on file  Social History Narrative   Patient identified her friend, [name], as her emergency contact  at [phone].   Lives   Caffeine use:     Outpatient Medications Prior to Visit  Medication Sig Dispense Refill  . aspirin 81 MG EC tablet Take 1 tablet (81 mg total) by mouth daily. 30 tablet 0  . atorvastatin (LIPITOR) 40 MG tablet TAKE 1 TABLET BY MOUTH DAILY AT 6:00 PM 90 tablet 3  . Azelaic Acid 15 % cream Apply 1 application topically daily.  2  . calcium-vitamin D (OSCAL WITH D) 500-200 MG-UNIT tablet Take 1 tablet by mouth daily with breakfast.    . Coenzyme Q10 (COQ10) 200 MG CAPS Take 200 mg by mouth daily.     . divalproex (DEPAKOTE) 500 MG DR tablet Take 1 tablet (500 mg total) by mouth 2 (two) times daily. 180 tablet 1  . FLUoxetine (PROZAC) 40 MG capsule Take 1 capsule (40 mg total) by mouth daily. 90 capsule 3  . hydrochlorothiazide (MICROZIDE) 12.5 MG capsule Take 1 capsule (12.5 mg total) by mouth daily. 90 capsule 3  . isosorbide mononitrate (IMDUR) 30 MG 24 hr tablet Take 0.5 tablets (15 mg total) by mouth daily for 30 days. 45 tablet 3  . magnesium 30 MG tablet Take 30 mg by mouth 2 (two) times daily.    . QUEtiapine (SEROQUEL) 25 MG tablet Take 1 tablet (25 mg total) by mouth at bedtime. 30 tablet 3   No facility-administered medications prior to visit.     Allergies  Allergen Reactions  . Amoxicillin Anaphylaxis  . Penicillins Anaphylaxis and Swelling    From childhood: Has patient had a PCN reaction causing immediate rash, facial/tongue/throat swelling, SOB or lightheadedness with hypotension: Yes Has patient had a PCN reaction causing severe rash involving mucus membranes or skin necrosis: Unk Has patient had a PCN reaction that required hospitalization: No Has patient had a PCN reaction occurring within the last 10 years: No If all of the above answers are "NO", then may proceed with Cephalosporin use. .  [name] [Levetiracetam]     Attributes worsening anxiety to keppra.  . Phenobarbital Other (See Comments)    Reaction not recalled by the patient  .  Phenytoin Other (See Comments)    "Makes me feel funny"   . Tape Rash    Please use paper tape    ROS Review of Systems  Objective:    Physical Exam  BP 122/76   Pulse 70   Temp 97.8 F (36.6 C) (Oral)   Ht 5\' 5"  (1.651 m)   Wt 176 lb 6.4 oz (80 kg)   SpO2 98%   BMI 29.35 kg/m  Wt Readings from Last 3 Encounters:  09/30/18 176 lb 6.4 oz (80 kg)  09/18/18 179 lb (81.2 kg)  09/11/18 173 lb 6.4 oz (78.7 kg)  Weight stable.  BP good Lungs clear Cardiac RRR without m or g Ext no edema Affect Oriented x 3.  Appears to have capacity at this point.  I still doubt her judgment.   Health Maintenance Due  Topic Date Due  . INFLUENZA VACCINE  03/12/2018    There are no preventive care reminders to display for this patient.  Lab Results  Component Value Date   TSH 0.708 07/17/2018   Lab Results  Component Value Date   WBC 9.5 08/13/2018   HGB 13.1 08/13/2018   HCT 41.2 08/13/2018   MCV 88.2 08/13/2018   PLT 312 08/13/2018   Lab Results  Component Value Date   NA 142 08/13/2018   K 3.6 08/13/2018   CO2 26 08/13/2018   GLUCOSE 117 (H) 08/13/2018   BUN 14 08/13/2018   CREATININE 0.64 08/13/2018   BILITOT 0.3 07/16/2018   ALKPHOS 104 07/16/2018   AST 32 07/16/2018   ALT 18 07/16/2018   PROT 7.0 07/16/2018   ALBUMIN 3.4 (L) 07/16/2018   CALCIUM 9.4 08/13/2018   ANIONGAP 10 08/13/2018   Lab Results  Component Value Date   CHOL 122 07/17/2018   Lab Results  Component Value Date   HDL 44 07/17/2018   Lab Results  Component Value Date   LDLCALC 70 07/17/2018   Lab Results  Component Value Date   TRIG 42 07/17/2018   Lab Results  Component Value Date   CHOLHDL 2.8 07/17/2018   Lab Results  Component Value Date   HGBA1C 5.0 07/17/2018      Assessment & Plan:   Problem List Items Addressed This Visit    Encounter for palliative care      No orders of the defined types were placed in this encounter.   Follow-up: No follow-ups on file.      [name], MD

## 2018-10-01 NOTE — Assessment & Plan Note (Signed)
Nicely controled without symptoms.

## 2018-10-01 NOTE — Assessment & Plan Note (Signed)
Remains high risk.  Asked about meals on wheels.  Gave [address] number for [employer].

## 2018-10-01 NOTE — Assessment & Plan Note (Signed)
Chronic and improved

## 2018-10-01 NOTE — Assessment & Plan Note (Signed)
No seizures.

## 2018-10-01 NOTE — Assessment & Plan Note (Signed)
See overview.  Took this lucid moment to make sure I understood goals of car.

## 2018-10-02 ENCOUNTER — Telehealth: Payer: Self-pay

## 2018-10-02 DIAGNOSIS — R296 Repeated falls: Secondary | ICD-10-CM

## 2018-10-02 NOTE — Telephone Encounter (Signed)
Patient called to see if PCP has filled out a form (?) or whatever needs to be done to get patient some help in her home.  Call back is [phone]  Danley Danker, RN [facility])

## 2018-10-02 NOTE — Telephone Encounter (Signed)
Called and spoke to patient.  I will put in another referral for home health.  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  In a rare moment of insight, she said. "You know, I don't always make the best decisions."  Order entered.

## 2018-10-06 ENCOUNTER — Telehealth: Payer: Self-pay | Admitting: Licensed Clinical Social Worker

## 2018-10-06 NOTE — Telephone Encounter (Signed)
   Telephone Outreach Note  10/06/2018 Name: CALANI GICK MRN: [id number] DOB: [birth year]  Referred by: PCP, Dr.Hensel  Reason for referral : Care Coordination  Review of patient notes from last visit with PCP 09/30/18 ref. Home health, meals on wheels referral, Assisted Living and assistance in her home. HH referral placed by PCP. F/U call to [facility] to check on referral sent for meals on wheel via [facility].  Referral status remains at Action Needed after two referrals submitted.  Per [employer] they are behind in referral but will use original [place] [facility] referral date.  LCSW called [name]  to assess needs, provide an update on Meals on Wheels referral and F/U from last phone call. Verified demographics.   Current Barriers: Limited social support, Level of care concerns and Social Isolation, patient has balance concerns when walking.   Interventions: Client interviewed and appropriate assessments performed. Patient was receiving  [place] , she no longer wanted the current provider due to feeling like she had to entertain this person.  Per patient her Aide did not do anything,  just sat and talked to her.  Notes indicated patient fired Engineer, production.   LCSW discussed assisted living options, getting PCS restarted, Health Care POA and Durable POA, as well as patient talking with daughter [name] about support making decisions.   Patient appears to be overwhelms which is causing frustration and her not wanting to make decisions.  She admitted to PCP that she does not always make the best decisions.  Patient would like to move forward with assisted living discussion but the process is overwhelming. Patient provided verbal approval for LCSW to assist with restarting [place].  She will provide daughter with LCSW's phone number to explain how to support her.    LCSW called  [facility] to verify status of PCS.  Patient is still active with [facility].   Called [facility] [phone]  to discuss process to resume services.  Left message for Nurse Roselyn Reef to call LCSW Goals: 1. Restart PCS Services 2. [place] 3. [place] and Durable POA 4. Find Assisted Living that will take Medicaid.   Follow Up Plan:  1. Patient will talk with daughters about becoming POA   2. LCSW waiting on return call from [place] for PCS 3. Will call patient in 3 to 5 days to see if she has spoke to daughter and to provide update.  [name], MD has been notified of this outreach and Ms. Aveena B Emery's  plan.   Casimer Lanius, [facility]   [phone] 10:43 AM

## 2018-10-07 NOTE — Telephone Encounter (Addendum)
  10/07/2018 Name: [name] MRN: [id number] DOB: July 03, 1931  LCSW received return phone call from [facility] with [facility] [phone]). Agency is in the process of trying to find a new aide for patient.  Per Roselyn Reef patient has been difficult, rude and not allowing the 3 previous aides to "do their job".  Patient has about 56 hours per month.  Roselyn Reef states patient could benefit from an increase in hours ( 80 hours per month).    LCSW asked if aide could prepare meals, etc....   Aides have offered in addition to [place] assisting patient with filling pillbox however patient declines this service.    Plan:   1. [facility] is working to find patient a new Engineer, production. 2. LCSW will reach out to patient in 1 to 2 days to provide an update 3. LCSW will talk with PCP and patient to see if she could benefit from an increase in hours  Casimer Lanius, [place]   [phone] 2:22 PM

## 2018-10-09 DIAGNOSIS — F319 Bipolar disorder, unspecified: Secondary | ICD-10-CM | POA: Diagnosis not present

## 2018-10-09 DIAGNOSIS — M810 Age-related osteoporosis without current pathological fracture: Secondary | ICD-10-CM | POA: Diagnosis not present

## 2018-10-09 DIAGNOSIS — J45909 Unspecified asthma, uncomplicated: Secondary | ICD-10-CM | POA: Diagnosis not present

## 2018-10-09 DIAGNOSIS — I10 Essential (primary) hypertension: Secondary | ICD-10-CM | POA: Diagnosis not present

## 2018-10-09 DIAGNOSIS — I2510 Atherosclerotic heart disease of native coronary artery without angina pectoris: Secondary | ICD-10-CM | POA: Diagnosis not present

## 2018-10-09 DIAGNOSIS — E669 Obesity, unspecified: Secondary | ICD-10-CM | POA: Diagnosis not present

## 2018-10-09 DIAGNOSIS — M19039 Primary osteoarthritis, unspecified wrist: Secondary | ICD-10-CM | POA: Diagnosis not present

## 2018-10-09 DIAGNOSIS — E785 Hyperlipidemia, unspecified: Secondary | ICD-10-CM | POA: Diagnosis not present

## 2018-10-09 DIAGNOSIS — Q282 Arteriovenous malformation of cerebral vessels: Secondary | ICD-10-CM | POA: Diagnosis not present

## 2018-10-09 NOTE — Telephone Encounter (Addendum)
  10/09/2018 Name: MAKYNZI EASTLAND MRN: [id number] DOB: March 31, 1931  F/U call to [name] .  Patient has started receiving home health PT and RN. LCSW provided update about Personal Care Services.  She should expect someone to call her in about a week to continue services.  Patient has not contacted daughter Rosaura Carpenter to discuss Durable POA for finances and Health Care POA.  Patient appreciative of assistance and has asked LCSW to contact daughter to explain this to her daughter.    Goals:  Patient would like to complete PT and try to move to [place] with her daughter.       Plan:  1. LCSW will contact patient's daughter per her request to discuss: Durable POA for finances, Health Care POA, Assisted Living process, and inform her patient would like assistance with logistics around getting rid of her home as this is too overwhelming for her. 2. LCSW will follow up with patient after talking with daugter.  Casimer Lanius, LCSW [facility]   [phone] 2:26 PM

## 2018-10-12 ENCOUNTER — Telehealth: Payer: Self-pay | Admitting: Family Medicine

## 2018-10-12 NOTE — Telephone Encounter (Signed)
[name] from [facility] is calling and would like verbal orders for physical therapy   1x a week for 1 week 2x week for 3 weeks 1x a week for 1 week  The best call back number is [phone]

## 2018-10-12 NOTE — Telephone Encounter (Signed)
Verbal orders given as requested. 

## 2018-10-13 ENCOUNTER — Encounter: Payer: Self-pay | Admitting: Family Medicine

## 2018-10-13 DIAGNOSIS — F319 Bipolar disorder, unspecified: Secondary | ICD-10-CM

## 2018-10-13 HISTORY — DX: Bipolar disorder, unspecified: F31.9

## 2018-10-13 NOTE — Telephone Encounter (Addendum)
Call to patient's daughter [name] per patient's request.  The following was discussed: Advance Directives(HPOA, Financial POA), Assisted Living options, patient moving to [place], and logistics of what needs to happen in order for patient to make the transition.  Patient and daughter are both in agreement that patient will move to [place]. Daughter agrees that patient no longer needs to live independently.  They have explored Assisted Living options in [place] and in [place] and based on patient's income she will not be able to afford ALF and patient is not willing to give up her social security check to pay for the ALF.  Daughter anticipates it will take about 3 to 4 months to complete the move.  Daughter is willing to move forward if patient is committed to doing PT so that she is strong enough to travel. Daughter will need to schedule time off of work to come help patient pack up her home. Goal:  1. Patient will move to [place] with Daughter [name] or [name]. 2. Patient will complete HPOA Plan:  1. Daughter will talk with sister and patient to decide who will be HPOA.  They will notify LCSW of their decision.  2. LCSW will F/U with patient in 3 to 5 days to review goals and plan.  Update and plan shared with PCP.  Casimer Lanius, LCSW [facility]   [phone] 4:32 PM

## 2018-10-14 DIAGNOSIS — I2510 Atherosclerotic heart disease of native coronary artery without angina pectoris: Secondary | ICD-10-CM | POA: Diagnosis not present

## 2018-10-14 DIAGNOSIS — M810 Age-related osteoporosis without current pathological fracture: Secondary | ICD-10-CM | POA: Diagnosis not present

## 2018-10-14 DIAGNOSIS — E785 Hyperlipidemia, unspecified: Secondary | ICD-10-CM | POA: Diagnosis not present

## 2018-10-14 DIAGNOSIS — E669 Obesity, unspecified: Secondary | ICD-10-CM | POA: Diagnosis not present

## 2018-10-14 DIAGNOSIS — J45909 Unspecified asthma, uncomplicated: Secondary | ICD-10-CM | POA: Diagnosis not present

## 2018-10-14 DIAGNOSIS — F319 Bipolar disorder, unspecified: Secondary | ICD-10-CM | POA: Diagnosis not present

## 2018-10-14 DIAGNOSIS — Q282 Arteriovenous malformation of cerebral vessels: Secondary | ICD-10-CM | POA: Diagnosis not present

## 2018-10-14 DIAGNOSIS — I10 Essential (primary) hypertension: Secondary | ICD-10-CM | POA: Diagnosis not present

## 2018-10-14 DIAGNOSIS — M19039 Primary osteoarthritis, unspecified wrist: Secondary | ICD-10-CM | POA: Diagnosis not present

## 2018-10-16 DIAGNOSIS — J45909 Unspecified asthma, uncomplicated: Secondary | ICD-10-CM | POA: Diagnosis not present

## 2018-10-16 DIAGNOSIS — M810 Age-related osteoporosis without current pathological fracture: Secondary | ICD-10-CM | POA: Diagnosis not present

## 2018-10-16 DIAGNOSIS — Q282 Arteriovenous malformation of cerebral vessels: Secondary | ICD-10-CM | POA: Diagnosis not present

## 2018-10-16 DIAGNOSIS — E669 Obesity, unspecified: Secondary | ICD-10-CM | POA: Diagnosis not present

## 2018-10-16 DIAGNOSIS — I10 Essential (primary) hypertension: Secondary | ICD-10-CM | POA: Diagnosis not present

## 2018-10-16 DIAGNOSIS — E785 Hyperlipidemia, unspecified: Secondary | ICD-10-CM | POA: Diagnosis not present

## 2018-10-16 DIAGNOSIS — I2510 Atherosclerotic heart disease of native coronary artery without angina pectoris: Secondary | ICD-10-CM | POA: Diagnosis not present

## 2018-10-16 DIAGNOSIS — F319 Bipolar disorder, unspecified: Secondary | ICD-10-CM | POA: Diagnosis not present

## 2018-10-16 DIAGNOSIS — M19039 Primary osteoarthritis, unspecified wrist: Secondary | ICD-10-CM | POA: Diagnosis not present

## 2018-10-16 NOTE — Telephone Encounter (Signed)
unsuccessful telephone outreach attempt to Ms. Randa Spike daughter [name] today to review plan discussed during previous conversation.   Plan: LCSW will wait for return call, if no return call is received, will reach out to [place] again over the next 7 days.   Casimer Lanius, [place]   [phone] 1:55 PM

## 2018-10-19 DIAGNOSIS — I2510 Atherosclerotic heart disease of native coronary artery without angina pectoris: Secondary | ICD-10-CM | POA: Diagnosis not present

## 2018-10-19 DIAGNOSIS — I10 Essential (primary) hypertension: Secondary | ICD-10-CM | POA: Diagnosis not present

## 2018-10-19 DIAGNOSIS — M810 Age-related osteoporosis without current pathological fracture: Secondary | ICD-10-CM | POA: Diagnosis not present

## 2018-10-19 DIAGNOSIS — M19039 Primary osteoarthritis, unspecified wrist: Secondary | ICD-10-CM | POA: Diagnosis not present

## 2018-10-19 DIAGNOSIS — E785 Hyperlipidemia, unspecified: Secondary | ICD-10-CM | POA: Diagnosis not present

## 2018-10-19 DIAGNOSIS — F319 Bipolar disorder, unspecified: Secondary | ICD-10-CM | POA: Diagnosis not present

## 2018-10-19 DIAGNOSIS — J45909 Unspecified asthma, uncomplicated: Secondary | ICD-10-CM | POA: Diagnosis not present

## 2018-10-19 DIAGNOSIS — E669 Obesity, unspecified: Secondary | ICD-10-CM | POA: Diagnosis not present

## 2018-10-19 DIAGNOSIS — Q282 Arteriovenous malformation of cerebral vessels: Secondary | ICD-10-CM | POA: Diagnosis not present

## 2018-10-20 ENCOUNTER — Ambulatory Visit: Payer: Medicare HMO | Admitting: Rehabilitation

## 2018-10-20 ENCOUNTER — Telehealth: Payer: Self-pay | Admitting: Licensed Clinical Social Worker

## 2018-10-20 NOTE — Telephone Encounter (Addendum)
2nd unsuccessful telephone outreach attempt to [name] 's daughter [name].   Plan: LCSW will wait for return call.  Casimer Lanius, [facility]   [phone] 12:06 PM

## 2018-10-20 NOTE — Telephone Encounter (Signed)
  10/20/2018 Name: [name] MRN: [id number] DOB: 1930/09/29   F/U call to [name] reference community support and coordinating support with her daughter [name]. Patient is pleasant during this call and engaged in conversation, reports doing well with PT, she is now able to walking with her cane and enjoyed being able to walk outside during PT.   Patient says she has spoken with her daughters and the plan is for her to move to [place].  Patient also indicated she has not heard from [place] for [place] reports needing help with IADL's and some ADL's.    LCSW called [facility] [phone],  spoke to [facility]. PCS assistant has not been assigned to patient.  She will get someone there by next week.  Roselyn Reef contacted patient last week to assess needs and make sure her medication is being filled but did not get an answer.  She will call back this week with the plan of having someone in place no later than next week.   Explained that patient screens her calls and will not answer if she does not know who is calling.  Roselyn Reef will make sure to leave a message.    Intervention: Discussed ongoing plan of moving to [place] with her daughters. Also spent time on engagement with patient.  Other intervention include: emotional support, and coordination of support. Plan:  1. Patient will ask daughter to call LCSW 2. LCSW F/U with patient in about 1 to 2 weeks   Margy Sumler, [place]   [phone] 3:36 PM

## 2018-10-21 NOTE — Telephone Encounter (Addendum)
  10/21/2018 Name: [name] MRN: [id number] DOB: 22-Feb-1931  LCSW received call from [name]  Daughter [name].   LCSW received voice message from daughter [name] leaving information for HPOA.  Patient and daughter's have spoken and agree that [name] will serve as primary [place] will be 2nd.  Long term goal is for patient to move to [place] to live with daughter [name].  No one has a time frame.  Patient wants to go to ALF but still not willing to give up her social security check to go to a facility.  Intervention: emotional support, and reflective listening Plan:  1. Patient will continue with PT to get stronger to make the trip to [place]  2. Work on eating 2 meals a day. 3. LCSW will assist patient with completing advance directives during her office visit with PCP.  [name], MD has been notified of this outreach and [name]'s plan.   Casimer Lanius, [facility]   [phone] 3:28 PM

## 2018-10-23 DIAGNOSIS — E669 Obesity, unspecified: Secondary | ICD-10-CM | POA: Diagnosis not present

## 2018-10-23 DIAGNOSIS — Q282 Arteriovenous malformation of cerebral vessels: Secondary | ICD-10-CM | POA: Diagnosis not present

## 2018-10-23 DIAGNOSIS — F319 Bipolar disorder, unspecified: Secondary | ICD-10-CM | POA: Diagnosis not present

## 2018-10-23 DIAGNOSIS — I10 Essential (primary) hypertension: Secondary | ICD-10-CM | POA: Diagnosis not present

## 2018-10-23 DIAGNOSIS — E785 Hyperlipidemia, unspecified: Secondary | ICD-10-CM | POA: Diagnosis not present

## 2018-10-23 DIAGNOSIS — M19039 Primary osteoarthritis, unspecified wrist: Secondary | ICD-10-CM | POA: Diagnosis not present

## 2018-10-23 DIAGNOSIS — J45909 Unspecified asthma, uncomplicated: Secondary | ICD-10-CM | POA: Diagnosis not present

## 2018-10-23 DIAGNOSIS — M810 Age-related osteoporosis without current pathological fracture: Secondary | ICD-10-CM | POA: Diagnosis not present

## 2018-10-23 DIAGNOSIS — I2510 Atherosclerotic heart disease of native coronary artery without angina pectoris: Secondary | ICD-10-CM | POA: Diagnosis not present

## 2018-10-26 DIAGNOSIS — E785 Hyperlipidemia, unspecified: Secondary | ICD-10-CM | POA: Diagnosis not present

## 2018-10-26 DIAGNOSIS — M810 Age-related osteoporosis without current pathological fracture: Secondary | ICD-10-CM | POA: Diagnosis not present

## 2018-10-26 DIAGNOSIS — E669 Obesity, unspecified: Secondary | ICD-10-CM | POA: Diagnosis not present

## 2018-10-26 DIAGNOSIS — M19039 Primary osteoarthritis, unspecified wrist: Secondary | ICD-10-CM | POA: Diagnosis not present

## 2018-10-26 DIAGNOSIS — Q282 Arteriovenous malformation of cerebral vessels: Secondary | ICD-10-CM | POA: Diagnosis not present

## 2018-10-26 DIAGNOSIS — F319 Bipolar disorder, unspecified: Secondary | ICD-10-CM | POA: Diagnosis not present

## 2018-10-26 DIAGNOSIS — I10 Essential (primary) hypertension: Secondary | ICD-10-CM | POA: Diagnosis not present

## 2018-10-26 DIAGNOSIS — J45909 Unspecified asthma, uncomplicated: Secondary | ICD-10-CM | POA: Diagnosis not present

## 2018-10-26 DIAGNOSIS — I2510 Atherosclerotic heart disease of native coronary artery without angina pectoris: Secondary | ICD-10-CM | POA: Diagnosis not present

## 2018-10-28 DIAGNOSIS — E669 Obesity, unspecified: Secondary | ICD-10-CM | POA: Diagnosis not present

## 2018-10-28 DIAGNOSIS — I2510 Atherosclerotic heart disease of native coronary artery without angina pectoris: Secondary | ICD-10-CM | POA: Diagnosis not present

## 2018-10-28 DIAGNOSIS — E785 Hyperlipidemia, unspecified: Secondary | ICD-10-CM | POA: Diagnosis not present

## 2018-10-28 DIAGNOSIS — J45909 Unspecified asthma, uncomplicated: Secondary | ICD-10-CM | POA: Diagnosis not present

## 2018-10-28 DIAGNOSIS — I10 Essential (primary) hypertension: Secondary | ICD-10-CM | POA: Diagnosis not present

## 2018-10-28 DIAGNOSIS — M810 Age-related osteoporosis without current pathological fracture: Secondary | ICD-10-CM | POA: Diagnosis not present

## 2018-10-28 DIAGNOSIS — Q282 Arteriovenous malformation of cerebral vessels: Secondary | ICD-10-CM | POA: Diagnosis not present

## 2018-10-28 DIAGNOSIS — M19039 Primary osteoarthritis, unspecified wrist: Secondary | ICD-10-CM | POA: Diagnosis not present

## 2018-10-28 DIAGNOSIS — F319 Bipolar disorder, unspecified: Secondary | ICD-10-CM | POA: Diagnosis not present

## 2018-10-30 ENCOUNTER — Other Ambulatory Visit: Payer: Self-pay | Admitting: Family Medicine

## 2018-10-30 MED ORDER — ISOSORBIDE MONONITRATE ER 30 MG PO TB24: 15.0000 mg | ORAL_TABLET | Freq: Every day | ORAL | 3 refills | Status: AC

## 2018-11-03 ENCOUNTER — Telehealth: Payer: Self-pay | Admitting: Family Medicine

## 2018-11-03 DIAGNOSIS — R296 Repeated falls: Secondary | ICD-10-CM

## 2018-11-03 NOTE — Telephone Encounter (Signed)
[place] Deep breath in, slow breath out. I set up physical therapy, please see note on 10/12/18.  She then dismissed home health after one week.  Now she wants it again.  Of course, she blames her change of mind on COVID.  I will do the referral because she needs it.

## 2018-11-03 NOTE — Telephone Encounter (Signed)
Pt would like for Dr. Andria Frames to call her to discuss depression and anxiety. She wants to let him know that she is staying home and is healthy but would like to discuss these referrals.   Please call patient back at [phone]

## 2018-11-04 DIAGNOSIS — E669 Obesity, unspecified: Secondary | ICD-10-CM | POA: Diagnosis not present

## 2018-11-04 DIAGNOSIS — J45909 Unspecified asthma, uncomplicated: Secondary | ICD-10-CM | POA: Diagnosis not present

## 2018-11-04 DIAGNOSIS — Q282 Arteriovenous malformation of cerebral vessels: Secondary | ICD-10-CM | POA: Diagnosis not present

## 2018-11-04 DIAGNOSIS — M19039 Primary osteoarthritis, unspecified wrist: Secondary | ICD-10-CM | POA: Diagnosis not present

## 2018-11-04 DIAGNOSIS — M810 Age-related osteoporosis without current pathological fracture: Secondary | ICD-10-CM | POA: Diagnosis not present

## 2018-11-04 DIAGNOSIS — E785 Hyperlipidemia, unspecified: Secondary | ICD-10-CM | POA: Diagnosis not present

## 2018-11-04 DIAGNOSIS — F319 Bipolar disorder, unspecified: Secondary | ICD-10-CM | POA: Diagnosis not present

## 2018-11-04 DIAGNOSIS — I2510 Atherosclerotic heart disease of native coronary artery without angina pectoris: Secondary | ICD-10-CM | POA: Diagnosis not present

## 2018-11-04 DIAGNOSIS — I10 Essential (primary) hypertension: Secondary | ICD-10-CM | POA: Diagnosis not present

## 2018-11-05 ENCOUNTER — Ambulatory Visit: Payer: Self-pay | Admitting: Family Medicine

## 2018-11-06 ENCOUNTER — Telehealth: Payer: Self-pay | Admitting: *Deleted

## 2018-11-06 NOTE — Telephone Encounter (Signed)
Kendra wanted to let Dr. Hensel know that pt has discharged from HH PT.  She has met her goals  But could still benefit from outpatient PT.  Her insurance will not allow more HH PT.  She was given home exercises.   , CMA  

## 2018-11-10 NOTE — Telephone Encounter (Signed)
Noted and agree. 

## 2018-11-11 ENCOUNTER — Telehealth: Payer: Self-pay | Admitting: Licensed Clinical Social Worker

## 2018-11-11 NOTE — Telephone Encounter (Signed)
Noted.  Thanks to [employer].  No further action.

## 2018-11-11 NOTE — Telephone Encounter (Signed)
   F/U Telephone Outreach Note  Session Start time: 9:10  Session End time: 9:30 Total time: 20 minutes  Referring Provider: Hensel Type of Visit: Telephonic Patient location: home LCSW Provider location: home All persons participating in visit: patient and LCSW  ASSESSMENT: Patient is pleasant and engaged in conversation. She is pleased with her new personal care services worker. "I love her she is wonderful".  Discussed ways that patient could obtain food without leaving her home, PCS worker prepares food for patient however food in her home is now limited.  Patient shared that her balance is off and she needs to do PT but is afraid to leave her home to go to PT.  Discussed options of things she can do in the home with the exercise sheets HH PT left for her to do.  She is sitting in a chair outside daily and trying to stay engaged. Patient reports no other concerns today and very appreciative of call. STRENGTHS:Wants to stay safe during time of covid -19 and has a neighbor that offers support. GOALS: 1. Gain strength and work on balance  2. Eat healthy meals  Interventions: Client interviewed and appropriate assessments performed. Provided client with information about home delivery grocery stores. Also utilized engagement as part of my intervention.  Other interventions include:  Solution-Focused Strategies and Supportive Counseling,as well as  Problem-solving teaching/coping strategies .  Patient requested a F/U call for LCSW to check in with her PCS worker. Plan:  1. Do exercise daily from work sheet provided by [place] 2.  Restock food in her home so that [facility] worker can cook  3.  LCSW will call back in 24 to 48 hours.  [name], MD has been notified of this outreach and Ms. Taelyr B Baggerly's plan.   Casimer Lanius, [facility]   [phone] 9:48 AM

## 2018-11-12 NOTE — Telephone Encounter (Addendum)
F/U call to patient.  She is doing well today.   Will call [employer] to deliver her groceries this month.  Also states her neighbor [name] handles her finance and helps her with things she can't remember.  Asked LCSW to add [name] to her emergency contact list.  Patient has not started exercises today.  Will try to remember to talk with her PCS aid tomorrow.  Plan: Contact list updated. LCSW will F/U in 3 to 7 days.  Casimer Lanius, [place]   [phone] 1:26 PM

## 2018-11-18 ENCOUNTER — Encounter: Payer: Medicare HMO | Admitting: Physical Therapy

## 2018-11-26 ENCOUNTER — Telehealth: Payer: Self-pay | Admitting: Licensed Clinical Social Worker

## 2018-11-26 NOTE — Telephone Encounter (Signed)
Care Coordination  Telephone Outreach Note 11/26/2018 Name: MARKEE REMLINGER MRN: [id number] DOB: 12-Mar-1931  F/U call to [name]  Patient is pleasant and engaged in conversation.  She reports two falls yesterday and difficulty with getting up. Patient has not been doing balance exercises recommended by PT as she forgets.  Patient's aide Karalee Height was at the home during the call.  Patient would like LCSW to speak to aide about reminding her to do exercises.   LCSW spoke with aide, she assessed home for possible trip hazards and re-arranged furniture to limit falls.  Aide reviewed exercise sheet with patient.  Plan: Aide will remind patient to do exercises each day from the list provided by PT. [name], MD has been notified of this outreach and Ms. Feiga B Kraeger's plan.    Casimer Lanius, [facility]   [phone] 1:04 PM

## 2018-11-26 NOTE — Telephone Encounter (Signed)
Noted and agree with suggestions.

## 2018-12-17 ENCOUNTER — Other Ambulatory Visit: Payer: Self-pay | Admitting: Family Medicine

## 2018-12-17 DIAGNOSIS — I2510 Atherosclerotic heart disease of native coronary artery without angina pectoris: Secondary | ICD-10-CM

## 2018-12-26 ENCOUNTER — Other Ambulatory Visit: Payer: Self-pay | Admitting: Neurology

## 2018-12-31 ENCOUNTER — Telehealth: Payer: Self-pay | Admitting: Licensed Clinical Social Worker

## 2018-12-31 NOTE — Telephone Encounter (Signed)
Patient called back and LM on nurse line.  She states that you can call [name] ( her daughter ) if needed. Christen Bame, CMA

## 2018-12-31 NOTE — Telephone Encounter (Signed)
   Phone Outreach Note  12/31/2018 Name: [name] MRN: [id number] DOB: 02/27/1931  Referred by: [name], MD Reason for referral : Care Coordination (phone check in)  An unsuccessful telephone outreach to patient was attempted today.  Left voice message to call LCSW   Follow Up Plan:  If no return call is received. LCSW will call again in 1 week.  Casimer Lanius, [facility]   [phone] 12:14 PM

## 2019-01-05 ENCOUNTER — Telehealth: Payer: Self-pay | Admitting: *Deleted

## 2019-01-05 NOTE — Telephone Encounter (Signed)
Patient states that she needs more Home Health PT for her balance.  Says that her outpatient PT was canceled due to Covid 19. Christen Bame, CMA

## 2019-01-05 NOTE — Telephone Encounter (Signed)
Please give verbal orders.  I am covering for Dr. Andria Frames.

## 2019-01-05 NOTE — Telephone Encounter (Signed)
Roselyn Reef calls for 2 reasons:  1. They are requesting that we send a PCS form with status change to get pt more hours.  Per Roselyn Reef for cognitive function is rapidly changing and she is becoming more forgetful.   2. HH PT for pt.  Advised that pt had also called about this time am.  PT is needed for unstable gait and increased falls.   Roselyn Reef would like a call back with updates.  Christen Bame, CMA

## 2019-01-05 NOTE — Telephone Encounter (Signed)
It is not for verbal orders.  She will need a referral placed for [facility] PT. Christen Bame, CMA

## 2019-01-06 NOTE — Telephone Encounter (Signed)
According to social work patient needs face-to-face (office) visit within past 90 days.  She is outside this window.  Please schedule her for office or video visit with her PCP.

## 2019-01-06 NOTE — Telephone Encounter (Signed)
Hi [name], Do you you know what a PCS form is?  I'm covering for Dr. Andria Frames while he is away.

## 2019-01-06 NOTE — Telephone Encounter (Signed)
Contacted Jaime @ [facility] to let her now what was going on.  While on the phone with her we went ahead and scheduled pt so that the aide (Sierra)would also be available to help with the appointment via video, she stated that she would adjust the aides schedule to be sure she was available to help with this. York Cerise also stated that she would let [name] know what time the appointment is. If [name] calls back you can let her know that the appointment is scheduled for 01/12/2019 @ 1:30pm via video. April Zimmerman Rumple, CMA

## 2019-01-06 NOTE — Telephone Encounter (Signed)
Contacted pt and informed her of below and she is going to do a video visit via her smart phone, she asked that I contact [name] @ [phone] to schedule. I left a generic message for her to call our office.  No pt identification was left so will try again if we dont hear back from her.Johnathin Vanderschaaf Zimmerman Rumple, CMA

## 2019-01-06 NOTE — Telephone Encounter (Signed)
Unfortunately Medicare requires a new face-to-face for Home health orders.  I'm assuming video visits count?  She can follow up with this at her upcoming appointment.

## 2019-01-07 NOTE — Telephone Encounter (Signed)
Request for Personal Care Service Referral  LCSW received request for assistance with completing personal care services referral for increase in PCS hours. Demographics of PCS referral completed and placed in  Dr.Hensel's mailbox.  [name], [facility]   [phone] 11:18 AM

## 2019-01-10 DIAGNOSIS — R4182 Altered mental status, unspecified: Secondary | ICD-10-CM | POA: Diagnosis not present

## 2019-01-11 DIAGNOSIS — R4182 Altered mental status, unspecified: Secondary | ICD-10-CM | POA: Diagnosis not present

## 2019-01-12 ENCOUNTER — Other Ambulatory Visit: Payer: Self-pay

## 2019-01-12 ENCOUNTER — Telehealth (INDEPENDENT_AMBULATORY_CARE_PROVIDER_SITE_OTHER): Payer: Medicare HMO | Admitting: Family Medicine

## 2019-01-12 DIAGNOSIS — M159 Polyosteoarthritis, unspecified: Secondary | ICD-10-CM

## 2019-01-12 DIAGNOSIS — R569 Unspecified convulsions: Secondary | ICD-10-CM

## 2019-01-12 DIAGNOSIS — R296 Repeated falls: Secondary | ICD-10-CM

## 2019-01-12 DIAGNOSIS — F339 Major depressive disorder, recurrent, unspecified: Secondary | ICD-10-CM

## 2019-01-12 DIAGNOSIS — R4182 Altered mental status, unspecified: Secondary | ICD-10-CM | POA: Diagnosis not present

## 2019-01-12 NOTE — Assessment & Plan Note (Signed)
Worse.  Needs PT assessment. Also needs face-to-face visit.

## 2019-01-12 NOTE — Progress Notes (Signed)
Tried video visit x 3.  No success. Phone call.  C/O balance problems. Needs at home physical therapy.  Has not had any physical therapy since hospital DC due to [place].    Managing on eating.  Does have some nausea.  Curreently getting 2 hours per day of PCS.  Cannot fix her own food.  Frequent falls.   \Friend [name] states gone down hill since last visit.  Can no longer walk to mailbox.  Gait unsteady, needs someone.   No one does vital signs. Wt uncertain.   Despite spending 25 minutes on the phone, I gained little concrete information.  I remain concerned. Will order PT and fill out new PCS form. She will schedule a face-to-face visit in 1-2 weeks.

## 2019-01-12 NOTE — Assessment & Plan Note (Signed)
Contributes to frequent falls.

## 2019-01-12 NOTE — Telephone Encounter (Signed)
I appreciate the coordination.  Despite our best efforts, the video appointment did not work and it was switched to a phone visit.  Please see that encounter for details.  [name] and [place] were both present during the visit.

## 2019-01-12 NOTE — Assessment & Plan Note (Signed)
No seizures on current meds

## 2019-01-12 NOTE — Assessment & Plan Note (Signed)
Stable mood.  Unstable home situation.

## 2019-01-13 ENCOUNTER — Telehealth: Payer: Self-pay | Admitting: *Deleted

## 2019-01-13 DIAGNOSIS — R4182 Altered mental status, unspecified: Secondary | ICD-10-CM | POA: Diagnosis not present

## 2019-01-13 NOTE — Telephone Encounter (Signed)
Due to current COVID 19 pandemic, our office is severely reducing in office visits until further notice, in order to minimize the risk to our patients and healthcare providers. I called and spoke to pt.  She was not able to make decision about doing VV.  I called and spoke to neighbor (on [place]) whom she see's on daily basis.  She stated pt is weak, does not exercise at all.  Had [facility] PT coming out once weekly, but pt did not continue therapy via home exercises as prescribed.  [name] is CNA thru [facility] who will be with pt on Monday from 12-2p.  She maybe able to assist with VV.  I will send email [email or web address].  LMVM for her [name], daughter of pt and relaying this message.

## 2019-01-13 NOTE — Telephone Encounter (Signed)
Request for Personal Care Service Referral  LCSW received consult  requesting assistance with completing personal care services referral.Demographics of PCS referral completed and returned to  Dr.Hensel   LCSW received completed and signed new request form for personal care services from.  Form faxed to [employer].   [name], [facility]   [phone] 2:32 PM

## 2019-01-13 NOTE — Telephone Encounter (Signed)
[name], I slipped the completed form under your door.

## 2019-01-14 DIAGNOSIS — R4182 Altered mental status, unspecified: Secondary | ICD-10-CM | POA: Diagnosis not present

## 2019-01-15 DIAGNOSIS — M810 Age-related osteoporosis without current pathological fracture: Secondary | ICD-10-CM | POA: Diagnosis not present

## 2019-01-15 DIAGNOSIS — K5730 Diverticulosis of large intestine without perforation or abscess without bleeding: Secondary | ICD-10-CM | POA: Diagnosis not present

## 2019-01-15 DIAGNOSIS — G609 Hereditary and idiopathic neuropathy, unspecified: Secondary | ICD-10-CM | POA: Diagnosis not present

## 2019-01-15 DIAGNOSIS — F319 Bipolar disorder, unspecified: Secondary | ICD-10-CM | POA: Diagnosis not present

## 2019-01-15 DIAGNOSIS — R4182 Altered mental status, unspecified: Secondary | ICD-10-CM | POA: Diagnosis not present

## 2019-01-15 DIAGNOSIS — G8384 Todd's paralysis (postepileptic): Secondary | ICD-10-CM | POA: Diagnosis not present

## 2019-01-15 DIAGNOSIS — M159 Polyosteoarthritis, unspecified: Secondary | ICD-10-CM | POA: Diagnosis not present

## 2019-01-15 DIAGNOSIS — I10 Essential (primary) hypertension: Secondary | ICD-10-CM | POA: Diagnosis not present

## 2019-01-15 DIAGNOSIS — M5010 Cervical disc disorder with radiculopathy, unspecified cervical region: Secondary | ICD-10-CM | POA: Diagnosis not present

## 2019-01-15 DIAGNOSIS — I2510 Atherosclerotic heart disease of native coronary artery without angina pectoris: Secondary | ICD-10-CM | POA: Diagnosis not present

## 2019-01-16 DIAGNOSIS — R4182 Altered mental status, unspecified: Secondary | ICD-10-CM | POA: Diagnosis not present

## 2019-01-17 DIAGNOSIS — R4182 Altered mental status, unspecified: Secondary | ICD-10-CM | POA: Diagnosis not present

## 2019-01-18 ENCOUNTER — Ambulatory Visit (INDEPENDENT_AMBULATORY_CARE_PROVIDER_SITE_OTHER): Payer: Medicare HMO | Admitting: Neurology

## 2019-01-18 ENCOUNTER — Other Ambulatory Visit: Payer: Self-pay

## 2019-01-18 ENCOUNTER — Telehealth: Payer: Self-pay | Admitting: Neurology

## 2019-01-18 ENCOUNTER — Encounter: Payer: Self-pay | Admitting: Neurology

## 2019-01-18 DIAGNOSIS — R4182 Altered mental status, unspecified: Secondary | ICD-10-CM | POA: Diagnosis not present

## 2019-01-18 DIAGNOSIS — R569 Unspecified convulsions: Secondary | ICD-10-CM | POA: Diagnosis not present

## 2019-01-18 NOTE — Progress Notes (Signed)
I have read the note, and I agree with the clinical assessment and plan.  Rahma Meller K Rishard Delange   

## 2019-01-18 NOTE — Addendum Note (Signed)
Addended by: Suzzanne Cloud on: 01/18/2019 02:05 PM   Modules accepted: Orders

## 2019-01-18 NOTE — Telephone Encounter (Signed)
I reached out to the pt and attempted to schedule her  f/u with Dr. Jannifer Franklin. Pt refused to schedule f/u at this time. Pt states she "has no idea where she will be in 3 months". Pt states she will call back to reschedule. I encouraged her to call us back as soon as she can to reschedule since Dr. Jannifer Franklin schedule tends to fill up pretty quickly.  Pt verbalized understanding.

## 2019-01-18 NOTE — Progress Notes (Addendum)
Virtual Visit via Video Note  I connected with [name] on 01/18/19 at  1:15 PM EDT by a video enabled telemedicine application and verified that I am speaking with the correct person using two identifiers.  Location: Patient: At her home  Provider: In the office    I discussed the limitations of evaluation and management by telemedicine and the availability of in person appointments. The patient expressed understanding and agreed to proceed.  History of Present Illness: 01/18/2019 SS: [name] is an 85 year old female with history of right parietal meningioma resection, has encephalomalacia as result.  She has been on seizure medications in the past, but was able to come off them, unfortunately she had a probable seizure in December 2019.  She was unable to tolerate Keppra, therefore was placed on Depakote.  She is currently taking Depakote 500 mg twice daily.  She has not had any recurrent seizure.  She reports that she is tired, drowsy.  She is currently living at home, her neighbor [name]) looks after her, she has a CNA that comes during the day.  She has had multiple falls, her most recent several days ago, laid on the floor for several hours.  Her children live in [place].  Over the last several months she has had a decline in her overall function.  She has an upcoming appointment with her primary care doctor to discuss physical therapy.  Her neighbor manages her medications.  She has a walker, but she does not use it all the time.  She indicates her appetite is fair.  She is not very active, stays in the chair during most the day.  She does not drive a car.  01/11/9508 Dr. Jannifer Franklin: [name] is an 85 year old right-handed white female with a history of a right parietal meningioma resection in the past, the patient has encephalomalacia associated with this.  The patient had been on seizure medications prior to the surgical resection of the meningioma and for some time afterwards  but she was able to come off the medications, she was driving a motor vehicle.  In December 2018 she fell and sustained a head injury with a small subdural hematoma that did not require surgical intervention.  She claims that she fell again in May 2019 and hit her head.  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  The patient was placed back on Keppra but this resulted in severe anxiety, and the patient was taken off of this and placed on Depakote.  The patient is not operating motor vehicle, she lives alone currently, she is thinking about making a transition to an extended care facility with assisted living.  The patient has reported a decrease in visual acuity in the right eye since the seizure, she has not yet seen her ophthalmologist.  The patient will be entering physical therapy in the near future.  She reports no new numbness or weakness of the face, arms, legs.  She does have some gait instability, she does not use a cane or a walker.  She denies issues controlling the bowels or the bladder.  She had no warning with the most recent seizure.  Observations/Objective: 97 temp, 131/85, HR 71  Is alert, answers some questions, limited historian, follows commands, slight tremor to bilateral hands when resting, her gait is forward leaning, hold on to the wall,  in her nightgown  Assessment and Plan: 1.  History of right parietal meningioma resection 2.  Seizure with associated Todd's paralysis  I will check lab work including a Depakote level, ammonia. She has some resting tremor to both hands, apparently this is not new.  She has not had recurrent seizure.  Her neighbor is currently managing her medications.  I remain concerned that she is living in the home, at times is alone.  She has history of frequent falls.  I am concerned for her safety.  I reviewed the record,  she has an upcoming appointment with her primary care doctor, for a face-to-face visit to discuss her frequent falls, possible physical therapy.  She tells me that assisted-living is too expensive at this time.  She does have a home health aide.  She does have a walker, however she does not use it all the time.  According to her neighbor, she has had a gradual decline, she does not do much during the day, requires assistance with ADLs, and supervision.   Follow Up Instructions: 2-3 months with Dr. Jannifer Franklin for in office visit    I discussed the assessment and treatment plan with the patient. The patient was provided an opportunity to ask questions and all were answered. The patient agreed with the plan and demonstrated an understanding of the instructions.   The patient was advised to call back or seek an in-person evaluation if the symptoms worsen or if the condition fails to improve as anticipated.  I provided 20 minutes of non-face-to-face time during this encounter.   Evangeline Dakin, DNP  [facility] [address] [place],  [address] [phone]

## 2019-01-18 NOTE — Addendum Note (Signed)
Addended by: Suzzanne Cloud on: 01/18/2019 02:06 PM   Modules accepted: Orders

## 2019-01-18 NOTE — Telephone Encounter (Signed)
Spoke to [place], Industrial/product designer, (on [place]).  She stated that the CNA will be with pt to help with visit.  (daughters live out [place]).  [name] neighbor was told that email sent and check in will call 30 min prior to appt.  Explained [email or web address], consent was given by neighbor.  Did not hear back from daughter, [name].   Understands that although there may be some limitations with this type of visit, we will take all precautions to reduce any security or privacy concerns.   Understands that this will be treated like an in office visit and we will file with pt's insurance, and there may be a patient responsible charge related to this service.

## 2019-01-18 NOTE — Telephone Encounter (Signed)
Will you get this patient set up to see Dr. Jannifer Franklin in the office in 2-3 months? She has had a decline according to neighbor, taking Depakote for seizures, frequent falls. I have ordered lab work to check levels. She is following with her PCP. Fortunately, she has not had recurrent seizure.

## 2019-01-19 ENCOUNTER — Other Ambulatory Visit (INDEPENDENT_AMBULATORY_CARE_PROVIDER_SITE_OTHER): Payer: Self-pay

## 2019-01-19 ENCOUNTER — Telehealth: Payer: Self-pay | Admitting: Family Medicine

## 2019-01-19 ENCOUNTER — Other Ambulatory Visit: Payer: Medicare HMO

## 2019-01-19 ENCOUNTER — Other Ambulatory Visit: Payer: Self-pay

## 2019-01-19 DIAGNOSIS — R569 Unspecified convulsions: Secondary | ICD-10-CM

## 2019-01-19 DIAGNOSIS — Z0289 Encounter for other administrative examinations: Secondary | ICD-10-CM

## 2019-01-19 DIAGNOSIS — R4182 Altered mental status, unspecified: Secondary | ICD-10-CM | POA: Diagnosis not present

## 2019-01-19 NOTE — Telephone Encounter (Signed)
Ana Physical Therapist from [facility] would like verbal orders.  Physical therapy  1 week 1 2 week 2 1 week 1  Also would like to have a Education officer, museum for [employer].  Best call back [phone]

## 2019-01-19 NOTE — Telephone Encounter (Signed)
Verbal orders given as requested. 

## 2019-01-20 DIAGNOSIS — R4182 Altered mental status, unspecified: Secondary | ICD-10-CM | POA: Diagnosis not present

## 2019-01-20 LAB — CBC WITH DIFFERENTIAL/PLATELET
Basophils Absolute: 0.1 10*3/uL (ref 0.0–0.2)
Basos: 1 %
EOS (ABSOLUTE): 0.1 10*3/uL (ref 0.0–0.4)
Eos: 2 %
Hematocrit: 36.6 % (ref 34.0–46.6)
Hemoglobin: 12.4 g/dL (ref 11.1–15.9)
Immature Grans (Abs): 0.1 10*3/uL (ref 0.0–0.1)
Immature Granulocytes: 1 %
Lymphocytes Absolute: 1.7 10*3/uL (ref 0.7–3.1)
Lymphs: 18 %
MCH: 32.1 pg (ref 26.6–33.0)
MCHC: 33.9 g/dL (ref 31.5–35.7)
MCV: 95 fL (ref 79–97)
Monocytes Absolute: 1.4 10*3/uL — ABNORMAL HIGH (ref 0.1–0.9)
Monocytes: 15 %
Neutrophils Absolute: 6.2 10*3/uL (ref 1.4–7.0)
Neutrophils: 63 %
Platelets: 236 10*3/uL (ref 150–450)
RBC: 3.86 x10E6/uL (ref 3.77–5.28)
RDW: 13.1 % (ref 11.7–15.4)
WBC: 9.5 10*3/uL (ref 3.4–10.8)

## 2019-01-20 LAB — VALPROIC ACID LEVEL: Valproic Acid Lvl: 29 ug/mL — ABNORMAL LOW (ref 50–100)

## 2019-01-20 LAB — COMPREHENSIVE METABOLIC PANEL
ALT: 9 IU/L (ref 0–32)
AST: 22 IU/L (ref 0–40)
Albumin/Globulin Ratio: 1.3 (ref 1.2–2.2)
Albumin: 3.2 g/dL — ABNORMAL LOW (ref 3.6–4.6)
Alkaline Phosphatase: 84 IU/L (ref 39–117)
BUN/Creatinine Ratio: 31 — ABNORMAL HIGH (ref 12–28)
BUN: 18 mg/dL (ref 8–27)
Bilirubin Total: 0.7 mg/dL (ref 0.0–1.2)
CO2: 25 mmol/L (ref 20–29)
Calcium: 8.8 mg/dL (ref 8.7–10.3)
Chloride: 101 mmol/L (ref 96–106)
Creatinine, Ser: 0.58 mg/dL (ref 0.57–1.00)
GFR calc Af Amer: 97 mL/min/{1.73_m2} (ref >59)
GFR calc non Af Amer: 84 mL/min/{1.73_m2} (ref >59)
Globulin, Total: 2.5 g/dL (ref 1.5–4.5)
Glucose: 78 mg/dL (ref 65–99)
Potassium: 3.7 mmol/L (ref 3.5–5.2)
Sodium: 140 mmol/L (ref 134–144)
Total Protein: 5.7 g/dL — ABNORMAL LOW (ref 6.0–8.5)

## 2019-01-20 LAB — AMMONIA: Ammonia: 55 ug/dL (ref 28–135)

## 2019-01-21 ENCOUNTER — Telehealth: Payer: Self-pay | Admitting: Neurology

## 2019-01-21 DIAGNOSIS — G609 Hereditary and idiopathic neuropathy, unspecified: Secondary | ICD-10-CM | POA: Diagnosis not present

## 2019-01-21 DIAGNOSIS — I2510 Atherosclerotic heart disease of native coronary artery without angina pectoris: Secondary | ICD-10-CM | POA: Diagnosis not present

## 2019-01-21 DIAGNOSIS — K5730 Diverticulosis of large intestine without perforation or abscess without bleeding: Secondary | ICD-10-CM | POA: Diagnosis not present

## 2019-01-21 DIAGNOSIS — M5010 Cervical disc disorder with radiculopathy, unspecified cervical region: Secondary | ICD-10-CM | POA: Diagnosis not present

## 2019-01-21 DIAGNOSIS — F319 Bipolar disorder, unspecified: Secondary | ICD-10-CM | POA: Diagnosis not present

## 2019-01-21 DIAGNOSIS — R4182 Altered mental status, unspecified: Secondary | ICD-10-CM | POA: Diagnosis not present

## 2019-01-21 DIAGNOSIS — I10 Essential (primary) hypertension: Secondary | ICD-10-CM | POA: Diagnosis not present

## 2019-01-21 DIAGNOSIS — M159 Polyosteoarthritis, unspecified: Secondary | ICD-10-CM | POA: Diagnosis not present

## 2019-01-21 DIAGNOSIS — G8384 Todd's paralysis (postepileptic): Secondary | ICD-10-CM | POA: Diagnosis not present

## 2019-01-21 DIAGNOSIS — M810 Age-related osteoporosis without current pathological fracture: Secondary | ICD-10-CM | POA: Diagnosis not present

## 2019-01-21 MED ORDER — DIVALPROEX SODIUM 500 MG PO DR TAB: DELAYED_RELEASE_TABLET | ORAL | 1 refills | Status: AC

## 2019-01-21 NOTE — Telephone Encounter (Signed)
Lvm for [name] to call back and schedule Ms. Degrasse appt.

## 2019-01-21 NOTE — Telephone Encounter (Signed)
I called the patient. Her Depakote level was low at 29.  I talked with Dr. Jannifer Franklin, we will increase the Depakote level taking 500 mg in the morning, 1000 mg at bedtime.  She will return in 3 to 4 months for revisit with Dr. Jannifer Franklin.  During her video visit, she did have some mild tremor, her neighbor [name], who is her caregiver, reports has been present for several years, has not worsened with the addition of Depakote.  She will let us know if the tremor changes. I talked with Ms. Klinkner, she was alert, able to answer questions, seemed to have a clear head during our conversation. I have sent in a new prescription. Please call the patient's neighbor, [name] to schedule follow-up since Dr. Tobey Grim schedule stays full. [phone].

## 2019-01-22 DIAGNOSIS — I10 Essential (primary) hypertension: Secondary | ICD-10-CM | POA: Diagnosis not present

## 2019-01-22 DIAGNOSIS — M810 Age-related osteoporosis without current pathological fracture: Secondary | ICD-10-CM | POA: Diagnosis not present

## 2019-01-22 DIAGNOSIS — K5730 Diverticulosis of large intestine without perforation or abscess without bleeding: Secondary | ICD-10-CM | POA: Diagnosis not present

## 2019-01-22 DIAGNOSIS — G8384 Todd's paralysis (postepileptic): Secondary | ICD-10-CM | POA: Diagnosis not present

## 2019-01-22 DIAGNOSIS — G609 Hereditary and idiopathic neuropathy, unspecified: Secondary | ICD-10-CM | POA: Diagnosis not present

## 2019-01-22 DIAGNOSIS — R4182 Altered mental status, unspecified: Secondary | ICD-10-CM | POA: Diagnosis not present

## 2019-01-22 DIAGNOSIS — F319 Bipolar disorder, unspecified: Secondary | ICD-10-CM | POA: Diagnosis not present

## 2019-01-22 DIAGNOSIS — I2510 Atherosclerotic heart disease of native coronary artery without angina pectoris: Secondary | ICD-10-CM | POA: Diagnosis not present

## 2019-01-22 DIAGNOSIS — M5010 Cervical disc disorder with radiculopathy, unspecified cervical region: Secondary | ICD-10-CM | POA: Diagnosis not present

## 2019-01-22 DIAGNOSIS — M159 Polyosteoarthritis, unspecified: Secondary | ICD-10-CM | POA: Diagnosis not present

## 2019-01-23 DIAGNOSIS — R4182 Altered mental status, unspecified: Secondary | ICD-10-CM | POA: Diagnosis not present

## 2019-01-25 DIAGNOSIS — G8384 Todd's paralysis (postepileptic): Secondary | ICD-10-CM | POA: Diagnosis not present

## 2019-01-25 DIAGNOSIS — F319 Bipolar disorder, unspecified: Secondary | ICD-10-CM | POA: Diagnosis not present

## 2019-01-25 DIAGNOSIS — I2510 Atherosclerotic heart disease of native coronary artery without angina pectoris: Secondary | ICD-10-CM | POA: Diagnosis not present

## 2019-01-25 DIAGNOSIS — K5730 Diverticulosis of large intestine without perforation or abscess without bleeding: Secondary | ICD-10-CM | POA: Diagnosis not present

## 2019-01-25 DIAGNOSIS — G609 Hereditary and idiopathic neuropathy, unspecified: Secondary | ICD-10-CM | POA: Diagnosis not present

## 2019-01-25 DIAGNOSIS — M159 Polyosteoarthritis, unspecified: Secondary | ICD-10-CM | POA: Diagnosis not present

## 2019-01-25 DIAGNOSIS — I10 Essential (primary) hypertension: Secondary | ICD-10-CM | POA: Diagnosis not present

## 2019-01-25 DIAGNOSIS — M5010 Cervical disc disorder with radiculopathy, unspecified cervical region: Secondary | ICD-10-CM | POA: Diagnosis not present

## 2019-01-25 DIAGNOSIS — M810 Age-related osteoporosis without current pathological fracture: Secondary | ICD-10-CM | POA: Diagnosis not present

## 2019-01-30 ENCOUNTER — Encounter (HOSPITAL_COMMUNITY): Payer: Self-pay | Admitting: Emergency Medicine

## 2019-01-30 ENCOUNTER — Other Ambulatory Visit: Payer: Self-pay

## 2019-01-30 ENCOUNTER — Inpatient Hospital Stay (HOSPITAL_COMMUNITY)
Admission: EM | Admit: 2019-01-30 | Discharge: 2019-02-04 | DRG: 292 | Disposition: A | Discharge status: Skilled Nursing Facility | Payer: Medicare HMO | Attending: Family Medicine | Admitting: Family Medicine

## 2019-01-30 ENCOUNTER — Emergency Department (HOSPITAL_COMMUNITY): Payer: Medicare HMO

## 2019-01-30 DIAGNOSIS — M1990 Unspecified osteoarthritis, unspecified site: Secondary | ICD-10-CM | POA: Diagnosis present

## 2019-01-30 DIAGNOSIS — D696 Thrombocytopenia, unspecified: Secondary | ICD-10-CM | POA: Diagnosis not present

## 2019-01-30 DIAGNOSIS — E7800 Pure hypercholesterolemia, unspecified: Secondary | ICD-10-CM | POA: Diagnosis not present

## 2019-01-30 DIAGNOSIS — D649 Anemia, unspecified: Secondary | ICD-10-CM | POA: Diagnosis present

## 2019-01-30 DIAGNOSIS — J45909 Unspecified asthma, uncomplicated: Secondary | ICD-10-CM | POA: Diagnosis present

## 2019-01-30 DIAGNOSIS — E871 Hypo-osmolality and hyponatremia: Secondary | ICD-10-CM | POA: Diagnosis present

## 2019-01-30 DIAGNOSIS — Z66 Do not resuscitate: Secondary | ICD-10-CM | POA: Diagnosis present

## 2019-01-30 DIAGNOSIS — R0902 Hypoxemia: Secondary | ICD-10-CM | POA: Diagnosis not present

## 2019-01-30 DIAGNOSIS — Z880 Allergy status to penicillin: Secondary | ICD-10-CM

## 2019-01-30 DIAGNOSIS — Z820 Family history of epilepsy and other diseases of the nervous system: Secondary | ICD-10-CM

## 2019-01-30 DIAGNOSIS — E876 Hypokalemia: Secondary | ICD-10-CM | POA: Diagnosis present

## 2019-01-30 DIAGNOSIS — Y92239 Unspecified place in hospital as the place of occurrence of the external cause: Secondary | ICD-10-CM | POA: Diagnosis not present

## 2019-01-30 DIAGNOSIS — Z8249 Family history of ischemic heart disease and other diseases of the circulatory system: Secondary | ICD-10-CM

## 2019-01-30 DIAGNOSIS — F331 Major depressive disorder, recurrent, moderate: Secondary | ICD-10-CM | POA: Diagnosis not present

## 2019-01-30 DIAGNOSIS — I110 Hypertensive heart disease with heart failure: Secondary | ICD-10-CM | POA: Diagnosis not present

## 2019-01-30 DIAGNOSIS — R1310 Dysphagia, unspecified: Secondary | ICD-10-CM | POA: Diagnosis present

## 2019-01-30 DIAGNOSIS — I959 Hypotension, unspecified: Secondary | ICD-10-CM | POA: Diagnosis not present

## 2019-01-30 DIAGNOSIS — R7989 Other specified abnormal findings of blood chemistry: Secondary | ICD-10-CM | POA: Diagnosis not present

## 2019-01-30 DIAGNOSIS — Z888 Allergy status to other drugs, medicaments and biological substances status: Secondary | ICD-10-CM | POA: Diagnosis not present

## 2019-01-30 DIAGNOSIS — Z7982 Long term (current) use of aspirin: Secondary | ICD-10-CM

## 2019-01-30 DIAGNOSIS — Z86011 Personal history of benign neoplasm of the brain: Secondary | ICD-10-CM

## 2019-01-30 DIAGNOSIS — I2510 Atherosclerotic heart disease of native coronary artery without angina pectoris: Secondary | ICD-10-CM | POA: Diagnosis not present

## 2019-01-30 DIAGNOSIS — R0602 Shortness of breath: Secondary | ICD-10-CM | POA: Diagnosis present

## 2019-01-30 DIAGNOSIS — F319 Bipolar disorder, unspecified: Secondary | ICD-10-CM | POA: Diagnosis present

## 2019-01-30 DIAGNOSIS — M542 Cervicalgia: Secondary | ICD-10-CM | POA: Diagnosis present

## 2019-01-30 DIAGNOSIS — Z20828 Contact with and (suspected) exposure to other viral communicable diseases: Secondary | ICD-10-CM | POA: Diagnosis present

## 2019-01-30 DIAGNOSIS — R070 Pain in throat: Secondary | ICD-10-CM | POA: Diagnosis not present

## 2019-01-30 DIAGNOSIS — Z515 Encounter for palliative care: Secondary | ICD-10-CM | POA: Diagnosis not present

## 2019-01-30 DIAGNOSIS — R627 Adult failure to thrive: Secondary | ICD-10-CM

## 2019-01-30 DIAGNOSIS — Z90710 Acquired absence of both cervix and uterus: Secondary | ICD-10-CM

## 2019-01-30 DIAGNOSIS — R279 Unspecified lack of coordination: Secondary | ICD-10-CM | POA: Diagnosis not present

## 2019-01-30 DIAGNOSIS — I080 Rheumatic disorders of both mitral and aortic valves: Secondary | ICD-10-CM | POA: Diagnosis present

## 2019-01-30 DIAGNOSIS — G8929 Other chronic pain: Secondary | ICD-10-CM | POA: Diagnosis present

## 2019-01-30 DIAGNOSIS — R531 Weakness: Secondary | ICD-10-CM

## 2019-01-30 DIAGNOSIS — E785 Hyperlipidemia, unspecified: Secondary | ICD-10-CM | POA: Diagnosis present

## 2019-01-30 DIAGNOSIS — T43595A Adverse effect of other antipsychotics and neuroleptics, initial encounter: Secondary | ICD-10-CM | POA: Diagnosis not present

## 2019-01-30 DIAGNOSIS — Z9842 Cataract extraction status, left eye: Secondary | ICD-10-CM | POA: Diagnosis not present

## 2019-01-30 DIAGNOSIS — R569 Unspecified convulsions: Secondary | ICD-10-CM | POA: Diagnosis present

## 2019-01-30 DIAGNOSIS — Z9841 Cataract extraction status, right eye: Secondary | ICD-10-CM | POA: Diagnosis not present

## 2019-01-30 DIAGNOSIS — R29898 Other symptoms and signs involving the musculoskeletal system: Secondary | ICD-10-CM | POA: Diagnosis not present

## 2019-01-30 DIAGNOSIS — R413 Other amnesia: Secondary | ICD-10-CM | POA: Diagnosis present

## 2019-01-30 DIAGNOSIS — I5033 Acute on chronic diastolic (congestive) heart failure: Secondary | ICD-10-CM | POA: Diagnosis present

## 2019-01-30 DIAGNOSIS — E860 Dehydration: Secondary | ICD-10-CM | POA: Diagnosis present

## 2019-01-30 DIAGNOSIS — Z609 Problem related to social environment, unspecified: Secondary | ICD-10-CM | POA: Diagnosis not present

## 2019-01-30 DIAGNOSIS — E669 Obesity, unspecified: Secondary | ICD-10-CM | POA: Diagnosis present

## 2019-01-30 DIAGNOSIS — Z96611 Presence of right artificial shoulder joint: Secondary | ICD-10-CM | POA: Diagnosis present

## 2019-01-30 DIAGNOSIS — Z6831 Body mass index (BMI) 31.0-31.9, adult: Secondary | ICD-10-CM

## 2019-01-30 DIAGNOSIS — Z743 Need for continuous supervision: Secondary | ICD-10-CM | POA: Diagnosis not present

## 2019-01-30 DIAGNOSIS — Z9181 History of falling: Secondary | ICD-10-CM

## 2019-01-30 DIAGNOSIS — R0600 Dyspnea, unspecified: Secondary | ICD-10-CM | POA: Diagnosis not present

## 2019-01-30 DIAGNOSIS — I10 Essential (primary) hypertension: Secondary | ICD-10-CM | POA: Diagnosis not present

## 2019-01-30 DIAGNOSIS — Z8673 Personal history of transient ischemic attack (TIA), and cerebral infarction without residual deficits: Secondary | ICD-10-CM

## 2019-01-30 DIAGNOSIS — F339 Major depressive disorder, recurrent, unspecified: Secondary | ICD-10-CM

## 2019-01-30 DIAGNOSIS — Z96653 Presence of artificial knee joint, bilateral: Secondary | ICD-10-CM | POA: Diagnosis present

## 2019-01-30 DIAGNOSIS — I25119 Atherosclerotic heart disease of native coronary artery with unspecified angina pectoris: Secondary | ICD-10-CM | POA: Diagnosis present

## 2019-01-30 DIAGNOSIS — Z79899 Other long term (current) drug therapy: Secondary | ICD-10-CM

## 2019-01-30 DIAGNOSIS — F329 Major depressive disorder, single episode, unspecified: Secondary | ICD-10-CM | POA: Diagnosis present

## 2019-01-30 DIAGNOSIS — Z7189 Other specified counseling: Secondary | ICD-10-CM | POA: Diagnosis not present

## 2019-01-30 LAB — CBC WITH DIFFERENTIAL/PLATELET
Abs Immature Granulocytes: 0.06 10*3/uL (ref 0.00–0.07)
Basophils Absolute: 0 10*3/uL (ref 0.0–0.1)
Basophils Relative: 0 %
Eosinophils Absolute: 0.1 10*3/uL (ref 0.0–0.5)
Eosinophils Relative: 1 %
HCT: 37.9 % (ref 36.0–46.0)
Hemoglobin: 12.5 g/dL (ref 12.0–15.0)
Immature Granulocytes: 1 %
Lymphocytes Relative: 20 %
Lymphs Abs: 1.7 10*3/uL (ref 0.7–4.0)
MCH: 31.9 pg (ref 26.0–34.0)
MCHC: 33 g/dL (ref 30.0–36.0)
MCV: 96.7 fL (ref 80.0–100.0)
Monocytes Absolute: 1.3 10*3/uL — ABNORMAL HIGH (ref 0.1–1.0)
Monocytes Relative: 15 %
Neutro Abs: 5.5 10*3/uL (ref 1.7–7.7)
Neutrophils Relative %: 63 %
Platelets: 149 10*3/uL — ABNORMAL LOW (ref 150–400)
RBC: 3.92 MIL/uL (ref 3.87–5.11)
RDW: 13.7 % (ref 11.5–15.5)
WBC: 8.6 10*3/uL (ref 4.0–10.5)
nRBC: 0 % (ref 0.0–0.2)

## 2019-01-30 LAB — URINALYSIS, ROUTINE W REFLEX MICROSCOPIC
Bilirubin Urine: NEGATIVE
Glucose, UA: NEGATIVE mg/dL
Hgb urine dipstick: NEGATIVE
Ketones, ur: 5 mg/dL — AB
Leukocytes,Ua: NEGATIVE
Nitrite: NEGATIVE
Protein, ur: NEGATIVE mg/dL
Specific Gravity, Urine: 1.018 (ref 1.005–1.030)
pH: 7 (ref 5.0–8.0)

## 2019-01-30 LAB — LIPASE, BLOOD: Lipase: 31 U/L (ref 11–51)

## 2019-01-30 LAB — MAGNESIUM: Magnesium: 2 mg/dL (ref 1.7–2.4)

## 2019-01-30 LAB — HEPATIC FUNCTION PANEL
ALT: 10 U/L (ref 0–44)
AST: 20 U/L (ref 15–41)
Albumin: 2.6 g/dL — ABNORMAL LOW (ref 3.5–5.0)
Alkaline Phosphatase: 70 U/L (ref 38–126)
Bilirubin, Direct: 0.2 mg/dL (ref 0.0–0.2)
Indirect Bilirubin: 0.3 mg/dL (ref 0.3–0.9)
Total Bilirubin: 0.5 mg/dL (ref 0.3–1.2)
Total Protein: 5.9 g/dL — ABNORMAL LOW (ref 6.5–8.1)

## 2019-01-30 LAB — BASIC METABOLIC PANEL
Anion gap: 13 (ref 5–15)
BUN: 22 mg/dL (ref 8–23)
CO2: 21 mmol/L — ABNORMAL LOW (ref 22–32)
Calcium: 9 mg/dL (ref 8.9–10.3)
Chloride: 100 mmol/L (ref 98–111)
Creatinine, Ser: 0.58 mg/dL (ref 0.44–1.00)
GFR calc Af Amer: >60 mL/min (ref >60)
GFR calc non Af Amer: >60 mL/min (ref >60)
Glucose, Bld: 112 mg/dL — ABNORMAL HIGH (ref 70–99)
Potassium: 3.1 mmol/L — ABNORMAL LOW (ref 3.5–5.1)
Sodium: 134 mmol/L — ABNORMAL LOW (ref 135–145)

## 2019-01-30 LAB — TSH: TSH: 1.566 u[IU]/mL (ref 0.350–4.500)

## 2019-01-30 LAB — TROPONIN I
Troponin I: 0.03 ng/mL (ref <0.03)
Troponin I: 0.03 ng/mL (ref <0.03)

## 2019-01-30 LAB — BRAIN NATRIURETIC PEPTIDE: B Natriuretic Peptide: 271.4 pg/mL — ABNORMAL HIGH (ref 0.0–100.0)

## 2019-01-30 LAB — VALPROIC ACID LEVEL: Valproic Acid Lvl: 51 ug/mL (ref 50.0–100.0)

## 2019-01-30 LAB — T4, FREE: Free T4: 0.97 ng/dL (ref 0.61–1.12)

## 2019-01-30 MED ORDER — HYDRALAZINE HCL 20 MG/ML IJ SOLN
5.0000 mg | INTRAMUSCULAR | Status: DC | PRN
  Administered 2019-02-01: 5 mg via INTRAVENOUS
  Filled 2019-01-30: qty 1

## 2019-01-30 MED ORDER — POTASSIUM CHLORIDE CRYS ER 20 MEQ PO TBCR
40.0000 meq | EXTENDED_RELEASE_TABLET | Freq: Once | ORAL | Status: AC
  Administered 2019-01-30: 40 meq via ORAL
  Filled 2019-01-30: qty 2

## 2019-01-30 MED ORDER — ENOXAPARIN SODIUM 40 MG/0.4ML ~~LOC~~ SOLN
40.0000 mg | Freq: Every day | SUBCUTANEOUS | Status: DC
  Administered 2019-01-31 – 2019-02-04 (×5): 40 mg via SUBCUTANEOUS
  Filled 2019-01-30 (×6): qty 0.4

## 2019-01-30 MED ORDER — ACETAMINOPHEN 325 MG PO TABS
650.0000 mg | ORAL_TABLET | ORAL | Status: DC | PRN
  Administered 2019-02-01: 650 mg via ORAL
  Filled 2019-01-30: qty 2

## 2019-01-30 MED ORDER — ONDANSETRON HCL 4 MG/2ML IJ SOLN: 4.0000 mg | Freq: Four times a day (QID) | INTRAMUSCULAR | Status: DC | PRN

## 2019-01-30 MED ORDER — ALBUTEROL SULFATE HFA 108 (90 BASE) MCG/ACT IN AERS
1.0000 | INHALATION_SPRAY | RESPIRATORY_TRACT | Status: DC | PRN
  Filled 2019-01-30: qty 6.7

## 2019-01-30 MED ORDER — ASPIRIN 81 MG PO CHEW
324.0000 mg | CHEWABLE_TABLET | Freq: Once | ORAL | Status: AC
  Administered 2019-01-30: 324 mg via ORAL
  Filled 2019-01-30: qty 4

## 2019-01-30 NOTE — ED Notes (Signed)
Neighbor, [phone] for updates

## 2019-01-30 NOTE — ED Notes (Signed)
Portable xray at bedside.

## 2019-01-30 NOTE — ED Triage Notes (Signed)
[facility]- pt here for decreased mobility over the last month. Pt states she feels weak. Pt had her Depakote increased recently and unsure if this is causing her problem.   106 CBG 97.5 temp 76 HR 130/72 96% 2L

## 2019-01-30 NOTE — ED Provider Notes (Signed)
[facility] EMERGENCY DEPARTMENT Provider Note   CSN: [id number] Arrival date & time: 01/30/19  1815    History   Chief Complaint No chief complaint on file.   HPI BRI WAKEMAN is a 85 y.o. female.     The history is provided by the patient, medical records and the EMS personnel.  Weakness Severity:  Moderate Onset quality:  Gradual Duration:  4 weeks Timing:  Constant Progression:  Worsening (worsened over last 2 days) Chronicity:  New Context: change in medication, dehydration, increased activity and stress   Context: not alcohol use   Relieved by:  Nothing Worsened by:  Activity, standing and stress Ineffective treatments:  Rest, medication, lying down, drinking fluids and eating Associated symptoms: cough, difficulty walking, frequency and shortness of breath   Associated symptoms: no abdominal pain, no aphasia, no chest pain, no dysuria, no fever, no hematochezia, no loss of consciousness, no melena, no myalgias, no nausea and no vomiting   Risk factors: coronary artery disease, new medications and recent stressors     Past Medical History:  Diagnosis Date  . Anemia   . Asthma    once in 1950's  . AVM (arteriovenous malformation)   . Bipolar disorder ([place]) 10/13/2018  . Cancer (HCC)     right leg  skin  . Coronary artery disease   . Depression    sucide attempt in the distant past  . Excessive sweating   . GI bleed   . Hiatal hernia    s/p repair  . HLD (hyperlipidemia)   . HTN (hypertension)   . Hypertension   . Low back pain    ESI by Dr. Nelva Bush 2 weeks ago  . OA (osteoarthritis)    bilateral wrist, bilateral knees  . Obesity   . Obesity   . Ovarian cyst   . Right wrist injury    multiple surgeries and residual weakness.   . Rotator cuff arthropathy    right  . Seizures ([place]) 1980s   2/2 meningioma. none since cranioplasty  . Tuberculosis    + tb test      (father had)    Patient Active Problem List   Diagnosis Date  Noted  . SOB (shortness of breath) 01/30/2019  . Encounter for palliative care 09/30/2018  . High risk social situation 07/30/2018  . 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 Frequent falls 12/25/2017  . Weight loss, non-intentional 08/14/2017  . S/P shoulder replacement 03/29/2016  . Diverticulitis of colon without hemorrhage 12/04/2015  . Rotator cuff syndrome of left shoulder 10/26/2015  . Cervical disc disorder with radiculopathy of cervical region 10/26/2015  . Heart murmur, systolic 17/40/8144  . Rosacea 09/07/2015  . Adenomatous colon polyp 04/12/2014  . Hearing loss in left ear 04/13/2013  . Coronary artery disease 03/25/2012  . Osteoporosis 11/13/2011  . Pure hypercholesterolemia 10/09/2006  . Major depression, recurrent, chronic ([place]) 10/09/2006  . Hereditary and idiopathic peripheral neuropathy 10/09/2006  . HYPERTENSION, BENIGN SYSTEMIC 10/09/2006  . Osteoarthritis, multiple sites 10/09/2006  . Seizures ([place]) 10/09/2006    Past Surgical History:  Procedure Laterality Date  . ABDOMINAL HYSTERECTOMY     left ovaries  . BREAST REDUCTION SURGERY    . CATARACT EXTRACTION Bilateral   . CRANIOPLASTY     2/2 meningioma 1980s  . CYSTECTOMY     rt ovary  age 48   . HIATAL HERNIA REPAIR    . KNEE ARTHROPLASTY     bilat  . LEFT  HEART CATHETERIZATION WITH CORONARY ANGIOGRAM N/A 03/26/2012   Procedure: LEFT HEART CATHETERIZATION WITH CORONARY ANGIOGRAM;  Surgeon: Minus Breeding, MD;  Location: [facility] CATH LAB;  Service: Cardiovascular;  Laterality: N/A;  . REVERSE SHOULDER ARTHROPLASTY Right 03/29/2016  . REVERSE SHOULDER ARTHROPLASTY Right 03/29/2016   Procedure: RIGHT REVERSE SHOULDER ARTHROPLASTY;  Surgeon: Netta Cedars, MD;  Location: [facility];  Service: Orthopedics;  Laterality: Right;     OB History   No obstetric history on file.      Home Medications    Prior to Admission medications   Medication Sig Start Date End Date Taking? Authorizing Provider   aspirin 81 MG EC tablet Take 1 tablet (81 mg total) by mouth daily. 07/23/18   Benay Pike, MD  atorvastatin (LIPITOR) 40 MG tablet TAKE 1 TABLET BY MOUTH DAILY AT 6:00 PM 08/26/18   [name], MD  Azelaic Acid 15 % cream Apply 1 application topically daily. 04/08/18   [provider]  calcium-vitamin D (OSCAL WITH D) 500-200 MG-UNIT tablet Take 1 tablet by mouth daily with breakfast. 06/15/18   [name], MD  Coenzyme Q10 (COQ10) 200 MG CAPS Take 200 mg by mouth daily.     [provider]  divalproex (DEPAKOTE) 500 MG DR tablet Take 1 tablet in the morning, take 2 tablets in the evening 01/21/19   Suzzanne Cloud, NP  FLUoxetine (PROZAC) 40 MG capsule Take 1 capsule (40 mg total) by mouth daily. 08/26/18   [name], MD  hydrochlorothiazide (MICROZIDE) 12.5 MG capsule Take 1 capsule (12.5 mg total) by mouth daily. 08/26/18   [name], MD  isosorbide mononitrate (IMDUR) 30 MG 24 hr tablet Take 0.5 tablets (15 mg total) by mouth daily. 10/30/18 10/25/19  [name], MD  magnesium 30 MG tablet Take 30 mg by mouth 2 (two) times daily.    [provider]  QUEtiapine (SEROQUEL) 25 MG tablet Take 1 tablet (25 mg total) by mouth at bedtime. 09/08/18   [name], MD    Family History Family History  Problem Relation Age of Onset  . Alzheimer's disease Mother   . Heart disease Father        MI 78yo  . Heart disease Brother     Social History Social History   Tobacco Use  . Smoking status: Never Smoker  . Smokeless tobacco: Never Used  Substance Use Topics  . Alcohol use: No  . Drug use: No     Allergies   Amoxicillin, Penicillins, Keppra [levetiracetam], Phenobarbital, Phenytoin, and Tape   Review of Systems Review of Systems  Constitutional: Negative for fever.  Respiratory: Positive for cough and shortness of breath.   Cardiovascular: Negative for chest pain.  Gastrointestinal: Negative for abdominal pain,  hematochezia, melena, nausea and vomiting.  Genitourinary: Positive for frequency. Negative for dysuria.  Musculoskeletal: Negative for myalgias.  Neurological: Positive for weakness. Negative for loss of consciousness.  All other systems reviewed and are negative.    Physical Exam Updated Vital Signs BP 133/65   Pulse 75   Temp 98.4 F (36.9 C) (Oral)   Resp 16   Ht 5\' 6"  (1.676 m)   Wt 89.4 kg   SpO2 97%   BMI 31.80 kg/m   Physical Exam Vitals signs and nursing note reviewed.  Constitutional:      Appearance: She is well-developed. She is not toxic-appearing.  HENT:     Head: Normocephalic and atraumatic.     Mouth/Throat:  Mouth: Mucous membranes are moist.  Eyes:     Conjunctiva/sclera: Conjunctivae normal.  Neck:     Musculoskeletal: Neck supple. No neck rigidity or muscular tenderness.  Cardiovascular:     Rate and Rhythm: Normal rate.  Pulmonary:     Effort: Pulmonary effort is normal. No respiratory distress.     Breath sounds: No stridor. No wheezing or rhonchi.  Abdominal:     Palpations: Abdomen is soft.     Tenderness: There is no abdominal tenderness.  Musculoskeletal:     Right lower leg: No edema.     Left lower leg: No edema.  Skin:    General: Skin is warm and dry.     Capillary Refill: Capillary refill takes less than 2 seconds.     Findings: No rash.  Neurological:     Mental Status: She is alert and oriented to person, place, and time.  Psychiatric:        Behavior: Behavior normal.      ED Treatments / Results  Labs (all labs ordered are listed, but only abnormal results are displayed) Labs Reviewed  URINALYSIS, ROUTINE W REFLEX MICROSCOPIC - Abnormal; Notable for the following components:      Result Value   APPearance CLOUDY (*)    Ketones, ur 5 (*)    All other components within normal limits  CBC WITH DIFFERENTIAL/PLATELET - Abnormal; Notable for the following components:   Platelets 149 (*)    Monocytes Absolute 1.3 (*)     All other components within normal limits  BASIC METABOLIC PANEL - Abnormal; Notable for the following components:   Sodium 134 (*)    Potassium 3.1 (*)    CO2 21 (*)    Glucose, Bld 112 (*)    All other components within normal limits  HEPATIC FUNCTION PANEL - Abnormal; Notable for the following components:   Total Protein 5.9 (*)    Albumin 2.6 (*)    All other components within normal limits  TROPONIN I - Abnormal; Notable for the following components:   Troponin I 0.03 (*)    All other components within normal limits  BRAIN NATRIURETIC PEPTIDE - Abnormal; Notable for the following components:   B Natriuretic Peptide 271.4 (*)    All other components within normal limits  NOVEL CORONAVIRUS, NAA (HOSPITAL ORDER, SEND-OUT TO REF LAB)  MAGNESIUM  T4, FREE  LIPASE, BLOOD  TSH  VALPROIC ACID LEVEL  CBC  BASIC METABOLIC PANEL  TROPONIN I  TROPONIN I  TROPONIN I  LIPID PANEL    EKG EKG Interpretation  Date/Time:  Saturday January 30 2019 18:54:52 EDT Ventricular Rate:  79 PR Interval:    QRS Duration: 108 QT Interval:  397 QTC Calculation: 456 R Axis:   4 Text Interpretation:  Sinus rhythm LVH with secondary repolarization abnormality flattening of t wave laterally Confirmed by Lennice Sites [phone]) on 01/30/2019 7:10:00 PM   Radiology Dg Pelvis Portable  Result Date: 01/30/2019 CLINICAL DATA:  Weakness and decreased mobility. EXAM: PORTABLE PELVIS 1-2 VIEWS COMPARISON:  11/19/2012 radiographs FINDINGS: There is no evidence of pelvic fracture or diastasis. No pelvic bone lesions are seen. IMPRESSION: Negative. Electronically Signed   By: Margarette Canada M.D.   On: 01/30/2019 19:34   Dg Chest Portable 1 View  Result Date: 01/30/2019 CLINICAL DATA:  Weakness EXAM: PORTABLE CHEST 1 VIEW COMPARISON:  07/22/2018 and prior radiographs FINDINGS: The cardiomediastinal silhouette is unchanged with stable SUPERIOR mediastinal fullness. There is no evidence of focal airspace  disease,  pulmonary edema, suspicious pulmonary nodule/mass, pleural effusion, or pneumothorax. No acute bony abnormalities are identified. RIGHT shoulder arthroplasty changes again noted. IMPRESSION: No acute abnormality. Electronically Signed   By: Margarette Canada M.D.   On: 01/30/2019 19:33    Procedures Procedures (including critical care time)  Medications Ordered in ED Medications  acetaminophen (TYLENOL) tablet 650 mg (has no administration in time range)  ondansetron (ZOFRAN) injection 4 mg (has no administration in time range)  enoxaparin (LOVENOX) injection 40 mg (has no administration in time range)  potassium chloride SA (K-DUR) CR tablet 40 mEq (has no administration in time range)  aspirin chewable tablet 324 mg (has no administration in time range)     Initial Impression / Assessment and Plan / ED Course  I have reviewed the triage vital signs and the nursing notes.  Pertinent labs & imaging results that were available during my care of the patient were reviewed by me and considered in my medical decision making (see chart for details).        Medical Decision Making: MALLARIE VOORHIES is a 85 y.o. female who presented to the ED today with weakness, fatigue with exertion, dyspnea.  Past medical history significant for hyperlipidemia, obesity, seizures, hypertension, CAD Reviewed and confirmed nursing documentation for past medical history, family history, social history.  On my initial exam, the pt was calm, cooperative, conversant, follows commands appropriately, GCS 15, not tachycardic, not hypotensive, afebrile, no increased work of breathing or respiratory distress, no signs of impending respiratory failure.   Multiple etiologies considered.  Trauma seems unlikely given history. Infection seems less likely given no review of systems positive for infection, will obtain chest x-ray, urine studies to further assess Seizure seems possible given history, however no shaking or  movements concerning for seizure Polypharmacy could be a potential etiology as patient is on multiple medications, patient recently started on Depakote, will check Depakote level Electrolyte abnormality versus encephalopathy versus uremia versus acidosis seem possible but less likely given history, will obtain electrolyte panel, will calculate anion gap, assess bicarb  EKG (my interpretation):      NS rhythm with a rate of  79.      QRS 108. QTc 456. PR 181.      T wave inversions in 2, 3, aVF that are new when compared to prior, T wave flattening in V3 through V6 were prominent T waves were present prior      No acute changes suggestive of hyperkalemia.      No WPW, LQTS, or Brugada's Syndrome.  Initial troponin detectable.  All radiology and laboratory studies reviewed independently and with my attending physician, agree with reading provided by radiologist unless otherwise noted.  Upon reassessing patient, patient was calm, resting comfortably, given new EKG changes, detectable troponin will admit patient for further evaluation and care, discussed with hospitalist and cardiology will hold off on starting heparin at this time as patient has remote history of hemorrhagic stroke, if troponin continues to trend up will reassess bleeding risk versus benefit of heparinization Based on the above findings, I believe patient requires admission. Pt admitted.  The above care was discussed with and agreed upon by my attending physician. Emergency Department Medication Summary:  Medications  acetaminophen (TYLENOL) tablet 650 mg (has no administration in time range)  ondansetron (ZOFRAN) injection 4 mg (has no administration in time range)  enoxaparin (LOVENOX) injection 40 mg (has no administration in time range)  potassium chloride SA (K-DUR) CR tablet 40 mEq (  has no administration in time range)  aspirin chewable tablet 324 mg (has no administration in time range)        Final Clinical  Impressions(s) / ED Diagnoses   Final diagnoses:  None    ED Discharge Orders    None       Lonzo Candy, MD 01/30/19 [place], [place], DO 01/30/19 2354

## 2019-01-30 NOTE — ED Notes (Signed)
Pt unable to give urine sample at this time. Pt given coffee and hot chocolate

## 2019-01-30 NOTE — H&P (Addendum)
History and Physical    [name] ZJI:[id number] DOB: Nov 06, 1930 DOA: 01/30/2019  PCP: [name], MD Patient coming from: Home  Chief Complaint: Generalized weakness, SOB  HPI: [name] is a 85 y.o. 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  Patient reports having generalized weakness and dyspnea on exertion for the past few months.  Denies any chest pain at rest or with exertion.  States she had a cough 3 weeks ago which she thought was due to postnasal drip.  Denies any fevers or chills.  Denies any nausea, vomiting, abdominal pain, diarrhea, or dysuria.  Not complaining of any pain.  Denies any recent falls or loss of consciousness.  ED Course: Afebrile and hemodynamically stable.  No leukocytosis.  Platelet count mildly low at 149,000, previously normal.  Potassium 3.1.  Magnesium normal.  Lipase and LFTs normal.  Troponin 0.03, previously elevated as well.  EKG with mild ST depression in leads I and II.  T wave flattening and inversions in inferior and lateral leads.  These findings are new compared to prior EKG from July 20, 2018. TSH and free T4 normal.  BNP 271, improved compared to prior value in the chart.  UA pending.  Valproic acid level within therapeutic range.  COVID-19 rapid test pending.  X-ray negative for acute abnormality.  Pelvic x-ray negative for acute finding.  Review of Systems:  All systems reviewed and apart from history of presenting illness, are negative.  Past Medical History:  Diagnosis Date  . Anemia   . Asthma    once in 1950's  . AVM (arteriovenous malformation)   . Bipolar disorder ([place])  10/13/2018  . Cancer (HCC)     right leg  skin  . Coronary artery disease   . Depression    sucide attempt in the distant past  . Excessive sweating   . GI bleed   . Hiatal hernia    s/p repair  . HLD (hyperlipidemia)   . HTN (hypertension)   . Hypertension   . Low back pain    ESI by Dr. Nelva Bush 2 weeks ago  . OA (osteoarthritis)    bilateral wrist, bilateral knees  . Obesity   . Obesity   . Ovarian cyst   . Right wrist injury    multiple surgeries and residual weakness.   . Rotator cuff arthropathy    right  . Seizures ([place]) 1980s   2/2 meningioma. none since cranioplasty  . Tuberculosis    + tb test      (father had)    Past Surgical History:  Procedure Laterality Date  . ABDOMINAL HYSTERECTOMY     left ovaries  . BREAST REDUCTION SURGERY    . CATARACT EXTRACTION Bilateral   . CRANIOPLASTY     2/2 meningioma 1980s  . CYSTECTOMY     rt ovary  age 7   . HIATAL HERNIA REPAIR    . KNEE ARTHROPLASTY     bilat  . LEFT HEART CATHETERIZATION WITH CORONARY ANGIOGRAM N/A 03/26/2012   Procedure: LEFT HEART CATHETERIZATION WITH CORONARY ANGIOGRAM;  Surgeon: Minus Breeding, MD;  Location: [facility] CATH LAB;  Service: Cardiovascular;  Laterality: N/A;  . REVERSE SHOULDER ARTHROPLASTY Right 03/29/2016  . REVERSE  SHOULDER ARTHROPLASTY Right 03/29/2016   Procedure: RIGHT REVERSE SHOULDER ARTHROPLASTY;  Surgeon: Netta Cedars, MD;  Location: [place];  Service: Orthopedics;  Laterality: Right;     reports that she has never smoked. She has never used smokeless tobacco. She reports that she does not drink alcohol or use drugs.  Allergies  Allergen Reactions  . Amoxicillin Anaphylaxis  . Penicillins Anaphylaxis and Swelling    From childhood: Has patient had a PCN reaction causing immediate rash, facial/tongue/throat swelling, SOB or lightheadedness with hypotension: Yes Has patient had a PCN reaction causing severe rash involving mucus membranes or skin necrosis: Unk Has patient had a PCN  reaction that required hospitalization: No Has patient had a PCN reaction occurring within the last 10 years: No If all of the above answers are "NO", then may proceed with Cephalosporin use. .  [name] [Levetiracetam]     Attributes worsening anxiety to keppra.  . Phenobarbital Other (See Comments)    Reaction not recalled by the patient  . Phenytoin Other (See Comments)    "Makes me feel funny"   . Tape Rash    Please use paper tape    Family History  Problem Relation Age of Onset  . Alzheimer's disease Mother   . Heart disease Father        MI 6yo  . Heart disease Brother     Prior to Admission medications   Medication Sig Start Date End Date Taking? Authorizing Provider  aspirin 81 MG EC tablet Take 1 tablet (81 mg total) by mouth daily. 07/23/18   Benay Pike, MD  atorvastatin (LIPITOR) 40 MG tablet TAKE 1 TABLET BY MOUTH DAILY AT 6:00 PM 08/26/18   [name], MD  Azelaic Acid 15 % cream Apply 1 application topically daily. 04/08/18   [provider]  calcium-vitamin D (OSCAL WITH D) 500-200 MG-UNIT tablet Take 1 tablet by mouth daily with breakfast. 06/15/18   [name], MD  Coenzyme Q10 (COQ10) 200 MG CAPS Take 200 mg by mouth daily.     [provider]  divalproex (DEPAKOTE) 500 MG DR tablet Take 1 tablet in the morning, take 2 tablets in the evening 01/21/19   Suzzanne Cloud, NP  FLUoxetine (PROZAC) 40 MG capsule Take 1 capsule (40 mg total) by mouth daily. 08/26/18   [name], MD  hydrochlorothiazide (MICROZIDE) 12.5 MG capsule Take 1 capsule (12.5 mg total) by mouth daily. 08/26/18   [name], MD  isosorbide mononitrate (IMDUR) 30 MG 24 hr tablet Take 0.5 tablets (15 mg total) by mouth daily. 10/30/18 10/25/19  [name], MD  magnesium 30 MG tablet Take 30 mg by mouth 2 (two) times daily.    [provider]  QUEtiapine (SEROQUEL) 25 MG tablet Take 1 tablet (25 mg total) by mouth at bedtime. 09/08/18    [name], MD    Physical Exam: Vitals:   01/30/19 2030 01/30/19 2115 01/30/19 2226 01/30/19 2245  BP: 137/64 133/65  (!) 144/74  Pulse: 78  75 78  Resp: 18 18 16  (!) 21  Temp:      TempSrc:      SpO2: 96%  97% 98%  Weight:      Height:        Physical Exam  Constitutional: She is oriented to person, place, and time. She appears well-developed and well-nourished. No distress.  HENT:  Head: Normocephalic.  Mouth/Throat: Oropharynx is clear and moist.  Eyes: Right  eye exhibits no discharge. Left eye exhibits no discharge.  Neck: Neck supple.  Cardiovascular: Normal rate, regular rhythm and intact distal pulses.  Murmur heard. Pulmonary/Chest: Effort normal and breath sounds normal. No respiratory distress. She has no wheezes. She has no rales.  Abdominal: Soft. Bowel sounds are normal. She exhibits no distension. There is no abdominal tenderness. There is no guarding.  Musculoskeletal:        General: Edema present.     Comments: Pitting edema of bilateral lower extremities  Neurological: She is alert and oriented to person, place, and time.  Skin: Skin is warm and dry. She is not diaphoretic.     Labs on Admission: I have personally reviewed following labs and imaging studies  CBC: Recent Labs  Lab 01/30/19 1848  WBC 8.6  NEUTROABS 5.5  HGB 12.5  HCT 37.9  MCV 96.7  PLT 725*   Basic Metabolic Panel: Recent Labs  Lab 01/30/19 1848  NA 134*  K 3.1*  CL 100  CO2 21*  GLUCOSE 112*  BUN 22  CREATININE 0.58  CALCIUM 9.0  MG 2.0   GFR: Estimated Creatinine Clearance: 57.9 mL/min (by C-G formula based on SCr of 0.58 mg/dL). Liver Function Tests: Recent Labs  Lab 01/30/19 1848  AST 20  ALT 10  ALKPHOS 70  BILITOT 0.5  PROT 5.9*  ALBUMIN 2.6*   Recent Labs  Lab 01/30/19 1848  LIPASE 31   No results for input(s): AMMONIA in the last 168 hours. Coagulation Profile: No results for input(s): INR, PROTIME in the last 168 hours. Cardiac  Enzymes: Recent Labs  Lab 01/30/19 1848  TROPONINI 0.03*   BNP (last 3 results) No results for input(s): PROBNP in the last 8760 hours. HbA1C: No results for input(s): HGBA1C in the last 72 hours. CBG: No results for input(s): GLUCAP in the last 168 hours. Lipid Profile: No results for input(s): CHOL, HDL, LDLCALC, TRIG, CHOLHDL, LDLDIRECT in the last 72 hours. Thyroid Function Tests: Recent Labs    01/30/19 1848  TSH 1.566  FREET4 0.97   Anemia Panel: No results for input(s): VITAMINB12, FOLATE, FERRITIN, TIBC, IRON, RETICCTPCT in the last 72 hours. Urine analysis:    Component Value Date/Time   COLORURINE YELLOW 01/30/2019 2155   APPEARANCEUR CLOUDY (A) 01/30/2019 2155   LABSPEC 1.018 01/30/2019 2155   PHURINE 7.0 01/30/2019 2155   GLUCOSEU NEGATIVE 01/30/2019 2155   HGBUR NEGATIVE 01/30/2019 2155   BILIRUBINUR NEGATIVE 01/30/2019 2155   BILIRUBINUR NEG 12/14/2015 1129   KETONESUR 5 (A) 01/30/2019 2155   PROTEINUR NEGATIVE 01/30/2019 2155   UROBILINOGEN 0.2 12/14/2015 1129   UROBILINOGEN 1.0 11/22/2012 0450   NITRITE NEGATIVE 01/30/2019 2155   LEUKOCYTESUR NEGATIVE 01/30/2019 2155    Radiological Exams on Admission: Dg Pelvis Portable  Result Date: 01/30/2019 CLINICAL DATA:  Weakness and decreased mobility. EXAM: PORTABLE PELVIS 1-2 VIEWS COMPARISON:  11/19/2012 radiographs FINDINGS: There is no evidence of pelvic fracture or diastasis. No pelvic bone lesions are seen. IMPRESSION: Negative. Electronically Signed   By: Margarette Canada M.D.   On: 01/30/2019 19:34   Dg Chest Portable 1 View  Result Date: 01/30/2019 CLINICAL DATA:  Weakness EXAM: PORTABLE CHEST 1 VIEW COMPARISON:  07/22/2018 and prior radiographs FINDINGS: The cardiomediastinal silhouette is unchanged with stable SUPERIOR mediastinal fullness. There is no evidence of focal airspace disease, pulmonary edema, suspicious pulmonary nodule/mass, pleural effusion, or pneumothorax. No acute bony abnormalities are  identified. RIGHT shoulder arthroplasty changes again noted. IMPRESSION: No acute abnormality. Electronically  Signed   By: Margarette Canada M.D.   On: 01/30/2019 19:33    EKG: Independently reviewed.  Sinus rhythm.  Mild ST depression in leads I and II.  T wave flattening and inversions in inferior and lateral leads.  These findings are new compared to prior EKG from July 20, 2018.  Assessment/Plan Principal Problem:   SOB (shortness of breath) Active Problems:   Hypokalemia   Generalized weakness   Thrombocytopenia (HCC)   Asthma  Generalized weakness and exertional shortness of breath, history of mild nonobstructive CAD Patient reports having generalized weakness and exertion shortness of breath for the past few months.  No infectious signs or symptoms.  Thyroid function normal.  Denies any chest pain at rest or with exertion.  Appears comfortable on exam.  Initial troponin 0.03. EKG with mild ST depression in leads I and II.  T wave flattening and inversions in inferior and lateral leads. These findings are new compared to prior EKG from July 20, 2018.  Per cardiology documentation, patient had a cath done in 2013 which revealed mild coronary plaque. -ED provider discussed the case with cardiology who recommended holding off heparin at this time given the patient's history of prior subdural hemorrhage.  -Aspirin 324 mg -Cardiac monitoring -Trend troponin -Serial EKGs -Echocardiogram -Check lipid panel -COVID-19 rapid test pending -PT evaluation  Mild thrombocytopenia Platelet count mildly low at 149,000, previously normal.   -Repeat CBC in a.m. to confirm  Hypokalemia Potassium 3.1.  Magnesium normal.   -Replete potassium.  Continue to monitor BMP.  Chronic diastolic congestive heart failure Last echo done December 2019 with LVEF 60 to 65% and grade 1 diastolic dysfunction.  BNP 271, improved compared to prior value in the chart.  Has bilateral lower extremity edema but lungs  clear on exam.  Chest x-ray without evidence of pulmonary edema. -Will hold off IV Lasix at this time  Asthma -Stable.  No bronchospasm.  Albuterol inhaler as needed.  Hypertension Currently normotensive. -Hydralazine PRN  Hyperlipidemia -Check lipid panel  Unable to safely order home medications at this time as pharmacy medication reconciliation is pending.  DVT prophylaxis: Lovenox Code Status: Patient wishes to be DNR. Family Communication: No family available. Disposition Plan: Anticipate discharge after clinical improvement. Consults called: Cardiology Admission status: It is my clinical opinion that referral for OBSERVATION is reasonable and necessary in this patient based on the above information provided. The aforementioned taken together are felt to place the patient at high risk for further clinical deterioration. However it is anticipated that the patient may be medically stable for discharge from the hospital within 24 to 48 hours.  The medical decision making on this patient was of high complexity and the patient is at high risk for clinical deterioration, therefore this is a level 3 visit.  Shela Leff MD [facility] Pager [phone]  If 7PM-7AM, please contact night-coverage [email or web address] Password [facility]  01/30/2019, 10:47 PM

## 2019-01-30 NOTE — ED Provider Notes (Signed)
I have personally seen and examined the patient. I have reviewed the documentation on PMH/FH/Soc Hx. I have discussed the plan of care with the resident and patient.  I have reviewed and agree with the resident's documentation. Please see associated encounter note.  Briefly, the patient is a 85 y.o. female here with generalized weakness.  Has been ongoing for weeks but worse over the last 2 days.  Patient denies cough, fever, shortness of breath, chest pain.  States that she has been going downhill ever since hospital admission several months ago.  EKG done shows new T wave inversions inferiorly and T wave flattening laterally.  Patient has history of CAD.  She appears neurologically intact.  Will do broad work-up including troponins, urinalysis.  Chest x-ray showed no signs of pneumonia, pneumothorax, pleural effusion.  Pelvis x-ray unremarkable.  Patient did have mild elevation in troponin and BNP.  Otherwise no significant anemia, electrolyte abnormality, kidney injury.  Given new T wave changes on EKG and elevated troponin concern for cardiac process including ACS.    Patient does have a history of hemorrhagic bleed in the past and cardiology recommends trending troponin at this time before starting heparin.  They recommend starting IV heparin if troponin continues to rise.  At this time given minimal symptoms will continue to check lab work.  Patient possibly with an NSTEMI however could be mild heart failure.  Patient admitted in stable condition.  This chart was dictated using voice recognition software.  Despite best efforts to proofread,  errors can occur which can change the documentation meaning.     EKG Interpretation  Date/Time:  Saturday January 30 2019 18:54:52 EDT Ventricular Rate:  79 PR Interval:    QRS Duration: 108 QT Interval:  397 QTC Calculation: 456 R Axis:   4 Text Interpretation:  Sinus rhythm LVH with secondary repolarization abnormality flattening of t wave laterally  Confirmed by Lennice Sites [phone]) on 01/30/2019 7:10:00 PM      .Critical Care Performed by: Lennice Sites, DO Authorized by: Lennice Sites, DO   Critical care provider statement:    Critical care time (minutes):  40   Critical care was necessary to treat or prevent imminent or life-threatening deterioration of the following conditions:  Cardiac failure   Critical care was time spent personally by me on the following activities:  Blood draw for specimens, development of treatment plan with patient or surrogate, discussions with consultants, discussions with primary provider, evaluation of patient's response to treatment, examination of patient, obtaining history from patient or surrogate, ordering and performing treatments and interventions, ordering and review of laboratory studies, ordering and review of radiographic studies, pulse oximetry, re-evaluation of patient's condition and review of old charts   I assumed direction of critical care for this patient from another provider in my specialty: no        Lennice Sites, DO 01/30/19 2311

## 2019-01-31 ENCOUNTER — Observation Stay (HOSPITAL_BASED_OUTPATIENT_CLINIC_OR_DEPARTMENT_OTHER): Payer: Medicare HMO

## 2019-01-31 ENCOUNTER — Other Ambulatory Visit: Payer: Self-pay

## 2019-01-31 DIAGNOSIS — R0602 Shortness of breath: Secondary | ICD-10-CM | POA: Diagnosis not present

## 2019-01-31 LAB — CBC
HCT: 34.2 % — ABNORMAL LOW (ref 36.0–46.0)
Hemoglobin: 11.5 g/dL — ABNORMAL LOW (ref 12.0–15.0)
MCH: 32 pg (ref 26.0–34.0)
MCHC: 33.6 g/dL (ref 30.0–36.0)
MCV: 95.3 fL (ref 80.0–100.0)
Platelets: 158 10*3/uL (ref 150–400)
RBC: 3.59 MIL/uL — ABNORMAL LOW (ref 3.87–5.11)
RDW: 13.7 % (ref 11.5–15.5)
WBC: 8 10*3/uL (ref 4.0–10.5)
nRBC: 0 % (ref 0.0–0.2)

## 2019-01-31 LAB — LIPID PANEL
Cholesterol: 99 mg/dL (ref 0–200)
HDL: 30 mg/dL — ABNORMAL LOW (ref >40)
LDL Cholesterol: 57 mg/dL (ref 0–99)
Total CHOL/HDL Ratio: 3.3 RATIO
Triglycerides: 61 mg/dL (ref <150)
VLDL: 12 mg/dL (ref 0–40)

## 2019-01-31 LAB — ECHOCARDIOGRAM COMPLETE
Height: 66 in
Weight: 2663.16 oz

## 2019-01-31 LAB — BASIC METABOLIC PANEL
Anion gap: 9 (ref 5–15)
BUN: 19 mg/dL (ref 8–23)
CO2: 25 mmol/L (ref 22–32)
Calcium: 9 mg/dL (ref 8.9–10.3)
Chloride: 102 mmol/L (ref 98–111)
Creatinine, Ser: 0.64 mg/dL (ref 0.44–1.00)
GFR calc Af Amer: >60 mL/min (ref >60)
GFR calc non Af Amer: >60 mL/min (ref >60)
Glucose, Bld: 99 mg/dL (ref 70–99)
Potassium: 4.2 mmol/L (ref 3.5–5.1)
Sodium: 136 mmol/L (ref 135–145)

## 2019-01-31 LAB — TROPONIN I
Troponin I: 0.03 ng/mL (ref <0.03)
Troponin I: 0.04 ng/mL (ref <0.03)

## 2019-01-31 LAB — SARS CORONAVIRUS 2 BY RT PCR (HOSPITAL ORDER, PERFORMED IN ~~LOC~~ HOSPITAL LAB): SARS Coronavirus 2: NEGATIVE

## 2019-01-31 MED ORDER — ATORVASTATIN CALCIUM 40 MG PO TABS
40.0000 mg | ORAL_TABLET | Freq: Every day | ORAL | Status: DC
  Administered 2019-01-31 – 2019-02-04 (×5): 40 mg via ORAL
  Filled 2019-01-31 (×5): qty 1

## 2019-01-31 MED ORDER — POTASSIUM CHLORIDE CRYS ER 20 MEQ PO TBCR
20.0000 meq | EXTENDED_RELEASE_TABLET | Freq: Once | ORAL | Status: AC
  Administered 2019-01-31: 20 meq via ORAL
  Filled 2019-01-31: qty 1

## 2019-01-31 MED ORDER — QUETIAPINE FUMARATE 25 MG PO TABS
25.0000 mg | ORAL_TABLET | Freq: Every day | ORAL | Status: DC
  Administered 2019-01-31: 25 mg via ORAL
  Filled 2019-01-31: qty 1

## 2019-01-31 MED ORDER — FUROSEMIDE 10 MG/ML IJ SOLN
40.0000 mg | Freq: Once | INTRAMUSCULAR | Status: AC
  Administered 2019-01-31: 40 mg via INTRAVENOUS
  Filled 2019-01-31: qty 4

## 2019-01-31 MED ORDER — ALBUTEROL SULFATE (2.5 MG/3ML) 0.083% IN NEBU: 2.5000 mg | INHALATION_SOLUTION | RESPIRATORY_TRACT | Status: DC | PRN

## 2019-01-31 MED ORDER — ISOSORBIDE MONONITRATE ER 30 MG PO TB24
15.0000 mg | ORAL_TABLET | Freq: Every day | ORAL | Status: DC
  Administered 2019-01-31 – 2019-02-04 (×5): 15 mg via ORAL
  Filled 2019-01-31 (×5): qty 1

## 2019-01-31 MED ORDER — ASPIRIN EC 81 MG PO TBEC
81.0000 mg | DELAYED_RELEASE_TABLET | Freq: Every day | ORAL | Status: DC
  Administered 2019-01-31 – 2019-02-04 (×5): 81 mg via ORAL
  Filled 2019-01-31 (×5): qty 1

## 2019-01-31 MED ORDER — FLUOXETINE HCL 20 MG PO CAPS
40.0000 mg | ORAL_CAPSULE | Freq: Every day | ORAL | Status: DC
  Administered 2019-01-31: 40 mg via ORAL
  Filled 2019-01-31: qty 2

## 2019-01-31 MED ORDER — METOPROLOL TARTRATE 12.5 MG HALF TABLET
12.5000 mg | ORAL_TABLET | Freq: Two times a day (BID) | ORAL | Status: DC
  Administered 2019-01-31 – 2019-02-04 (×8): 12.5 mg via ORAL
  Filled 2019-01-31 (×8): qty 1

## 2019-01-31 NOTE — ED Notes (Signed)
Pt ambulated to the restroom with a walker and one assist. This NT walked with the Pt and noticed that there was some weakness while changing positions from sitting to standing.

## 2019-01-31 NOTE — Evaluation (Signed)
Physical Therapy Evaluation Patient Details Name: [name] MRN: [id number] DOB: 1930-11-18 Today's Date: 01/31/2019   History of Present Illness  [name] is a 85 y.o. 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  Patient reports having generalized weakness and dyspnea on exertion for the past few months.   Clinical Impression   Pt admitted with above diagnosis. Pt currently with functional limitations due to the deficits listed below (see PT Problem List). Living at home by herself, with [facility] service, that helped her with ADLs and IADLs, also very helpful neighbors; Presents to PT with generalized pain from frequent falls, weakness, difficulty managing her ADLs; Recommend SNFf or post-acute rehab to maximize independence and safety with mobility;  Pt will benefit from skilled PT to increase their independence and safety with mobility to allow discharge to the venue listed below.       Follow Up Recommendations SNF    Equipment Recommendations  Rolling walker with 5" wheels;3in1 (PT)    Recommendations for Other Services       Precautions / Restrictions Precautions Precautions: Fall Precaution Comments: Reports many falls at home recently      Mobility  Bed Mobility Overal bed mobility: Needs Assistance Bed Mobility: Supine to Sit     Supine to sit: Min assist;Mod assist     General bed mobility comments: Min assist to initiate and help trunk to upright sitting position  Transfers Overall transfer level: Needs assistance Equipment used: Rolling walker (2 wheeled) Transfers: Sit to/from Stand Sit to Stand: Mod  assist         General transfer comment: Mod assist to power up to stand   Ambulation/Gait Ambulation/Gait assistance: Min assist Gait Distance (Feet): (sidesteps toward [facility]) Assistive device: Rolling walker (2 wheeled) Gait Pattern/deviations: Shuffle     General Gait Details: Cues to self-monitor for activity tolerance  Stairs            Wheelchair Mobility    Modified Rankin (Stroke Patients Only)       Balance Overall balance assessment: History of Falls                                           Pertinent Vitals/Pain Pain Assessment: Faces Faces Pain Scale: Hurts even more Pain Location: Reorts pain "all over from all the falls" and also L shoulder pain, tells me she needs a L TSA Pain Descriptors / Indicators: Grimacing;Guarding Pain Intervention(s): Repositioned;Limited activity within patient's tolerance;Monitored during session    Home Living Family/patient expects to be discharged to:: Private residence Living Arrangements: Alone Available Help at Discharge: Friend(s);Available PRN/intermittently;Neighbor Type of Home: House Home Access: Stairs to enter   [employer] of Steps: 1 Home Layout: One level Home Equipment: Environmental consultant - 2 wheels;Shower seat Additional Comments: She reports she has two daughters who live in [place]    Prior Function Level of Independence: Independent         Comments: Did not report using an assistive device, but did report that she has had frequent falls     Hand Dominance   Dominant Hand: Right    Extremity/Trunk  Assessment   Upper Extremity Assessment Upper Extremity Assessment: LUE deficits/detail LUE Deficits / Details: L shoulder pain with movement; pt tells me she nees a L shoulder replacement LUE: Unable to fully assess due to pain    Lower Extremity Assessment Lower Extremity Assessment: Generalized weakness       Communication   Communication: No difficulties   Cognition Arousal/Alertness: Awake/alert(thoguh sleepy) Behavior During Therapy: Flat affect Overall Cognitive Status: Within Functional Limits for tasks assessed(for simple mobility tasks)                                        General Comments General comments (skin integrity, edema, etc.): incontinent of urine in the bed    Exercises     Assessment/Plan    PT Assessment Patient needs continued PT services  PT Problem List Decreased strength;Decreased range of motion;Decreased activity tolerance;Decreased balance;Decreased mobility;Decreased coordination;Decreased cognition;Decreased knowledge of use of DME;Decreased safety awareness       PT Treatment Interventions DME instruction;Gait training;Functional mobility training;Therapeutic activities;Therapeutic exercise;Balance training;Neuromuscular re-education;Cognitive remediation;Patient/family education    PT Goals (Current goals can be found in the Care Plan section)  Acute Rehab PT Goals Patient Stated Goal: Told me she wants to move to [place] to be closer to her daughters PT Goal Formulation: With patient Time For Goal Achievement: 02/14/19 Potential to Achieve Goals: Fair    Frequency Min 2X/week   Barriers to discharge Decreased caregiver support      Co-evaluation               AM-PAC PT "6 Clicks" Mobility  Outcome Measure Help needed turning from your back to your side while in a flat bed without using bedrails?: A Little Help needed moving from lying on your back to sitting on the side of a flat bed without using bedrails?: A Lot Help needed moving to and from a bed to a chair (including a wheelchair)?: A Little Help needed standing up from a chair using your arms (e.g., wheelchair or bedside chair)?: A Lot Help needed to walk in hospital room?: A Little Help needed climbing 3-5 steps with a railing? : A Lot 6 Click Score: 15    End of Session Equipment Utilized During  Treatment: Gait belt Activity Tolerance: Patient tolerated treatment well Patient left: in bed;with call bell/phone within reach;with nursing/sitter in room(preparing for echo) Nurse Communication: Mobility status PT Visit Diagnosis: Unsteadiness on feet (R26.81);Other abnormalities of gait and mobility (R26.89);Repeated falls (R29.6);Muscle weakness (generalized) (M62.81);History of falling (Z91.81)    Time: 3007-6226 PT Time Calculation (min) (ACUTE ONLY): 22 min   Charges:   PT Evaluation $PT Eval Moderate Complexity: 1 Mod          Roney Marion, [place]  Acute Rehabilitation Services Pager [phone] Office [phone]   Colletta [place] 01/31/2019, 1:13 PM

## 2019-01-31 NOTE — ED Notes (Signed)
Rathore MD paged to verify that [facility] is appropriate bed placement.

## 2019-01-31 NOTE — Progress Notes (Signed)
Spoke with pt's neighbor with pt's permission who brought her to ED.  Neighbor is bringing pt's glasses to the hospital for her.  Neighbor mentioned that pt had become very lethargic, confused and weak in the preceding week or so.

## 2019-01-31 NOTE — ED Notes (Signed)
Per Rathore Md, pt cannot go to this room due to Covid (+) pts. Bed placement called.

## 2019-01-31 NOTE — Progress Notes (Signed)
  Echocardiogram 2D Echocardiogram has been performed.  [name] 01/31/2019, 12:54 PM

## 2019-01-31 NOTE — TOC Initial Note (Signed)
Transition of Care [facility]) - Initial/Assessment Note    Patient Details  Name: [name] MRN: [id number] Date of Birth: [birth year]  Transition of Care [facility]) CM/SW Contact:    Claudie Leach, RN Phone Number: 01/31/2019, 5:46 PM  Clinical Narrative:                 Patient from home alone.  Patient has personal care assistant provided by Medicaid for 4 hr/day M-F but patient states she is struggling to care for herself the rest of the day and she needs more help.  She states her daughter lives in [place] and she would like to move with her, but thinks she may have need of SNF at d/c from this hospitalization.    Patient states her aide grocery shops for her, assists her to appointments, and her prescriptions are delivered.    Expected Discharge Plan: [place] Barriers to Discharge: Continued Medical Work up   Patient Goals and CMS Choice Patient states their goals for this hospitalization and ongoing recovery are:: to move to [facility]      Expected Discharge Plan and Services Expected Discharge Plan: [place] arrangements for the past 2 months: Single Family Home                   Prior Living Arrangements/Services Living arrangements for the past 2 months: Single Family Home Lives with:: Self Patient language and need for interpreter reviewed:: Yes Do you feel safe going back to the place where you live?: No   "I need more help"  Need for Family Participation in Patient Care: Yes (Comment) Care giver support system in place?: Yes (comment) Current home services: Homehealth aide Criminal Activity/Legal Involvement Pertinent to Current Situation/Hospitalization: No - Comment as needed   Emotional Assessment Appearance:: Appears stated age Attitude/Demeanor/Rapport: Gracious, Engaged Affect (typically observed): Accepting Orientation: : Oriented to Self, Oriented to Place, Oriented to  Time, Oriented to  Situation Alcohol / Substance Use: Not Applicable Psych Involvement: No (comment)  Admission diagnosis:  Weakness [R53.1] Patient Active Problem List   Diagnosis Date Noted  . SOB (shortness of breath) 01/30/2019  . Generalized weakness 01/30/2019  . Thrombocytopenia ([place]) 01/30/2019  . Asthma 01/30/2019  . Encounter for palliative care 09/30/2018  . High risk social situation 07/30/2018  . 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 Frequent falls 12/25/2017  . Weight loss, non-intentional 08/14/2017  . S/P shoulder replacement 03/29/2016  . Diverticulitis of colon without hemorrhage 12/04/2015  . Rotator cuff syndrome of left shoulder 10/26/2015  . Cervical disc disorder with radiculopathy of cervical region 10/26/2015  . Heart murmur, systolic 58/85/0277  . Rosacea 09/07/2015  . Adenomatous colon polyp 04/12/2014  . Hearing loss in left ear 04/13/2013  . Coronary artery disease 03/25/2012  . Hypokalemia 03/25/2012  . Osteoporosis 11/13/2011  . Pure hypercholesterolemia 10/09/2006  . Major depression, recurrent, chronic ([place]) 10/09/2006  . Hereditary and idiopathic peripheral neuropathy 10/09/2006  . HYPERTENSION, BENIGN SYSTEMIC 10/09/2006  . Osteoarthritis, multiple sites 10/09/2006  . Seizures ([place]) 10/09/2006   PCP:  [name], MD Pharmacy:   [facility] [place] [address] [place] [address] Phone: [phone] Fax: [place], [place] [address] Phone: [phone] Fax: [phone]

## 2019-01-31 NOTE — Progress Notes (Signed)
FPTS Interim Progress Note  Spoke to Dr. Loleta Books ([facility]) regarding patient who was admitted ON to [facility] service. Patient sees Dr. Andria Frames of [facility] Given that patient has been admitted to triad we are happy to take over care tomorrow 6/22 in the AM. Discussed this with Dr. Loleta Books who also agreed to see patient today and will plan to transfer attending care on 6/22.   Appreciate care of [facility] service.    Caroline More, DO 01/31/2019, 9:50 AM PGY-2, [facility] pager [phone]

## 2019-01-31 NOTE — Care Management Obs Status (Signed)
[facility] NOTIFICATION   Patient Details  Name: [name] MRN: [id number] Date of Birth: 04/16/1931   Medicare Observation Status Notification Given:  Yes    Claudie Leach, RN 01/31/2019, 5:40 PM

## 2019-01-31 NOTE — Progress Notes (Addendum)
I spoke with [name], Pt's daughter to give her update about Pt. All questions were answered. She requested Case Amadeo Garnet please call her on Monay morning to verify the SNF for Pt. [name] stated that her mother has been very difficult to deal with lately after her mother fell on November last year and got seizure and her cognitive level has been declined. She would like to find the best placement for her mother and need to make sure that her mother is qualified for it. 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 She understood the plan of care and had no further questions at this time.  I spoke with Marlou Starks, who called me.She is Pt's second daughter. She lives in [place] as well as her sister. I gave her update about Pt. [name] stated that her sister" [name]" has never visited Pt for over 10 years and they both, Pt and [name] always have disagreement and very difficult relationship. [name] stated herself usually visits her Mother every year. She has been trying to convince her mother to live in [place] closer to her, but Pt refused. However,all question answered and she satisfied and had no more questions at this time.  I spoke with [name] who called me. She is Pt 's best friend and a closest neighbor who help taking care of Pt. Information updated, all questions answered.   Kennyth Lose, RN

## 2019-01-31 NOTE — Progress Notes (Addendum)
PROGRESS NOTE    [name]  MHD:[id number] DOB: 21-Dec-1930 DOA: 01/30/2019 PCP: [name], MD      Brief Narrative:  [name] is a 85 y.o. F with CAD no prior PCI, HTN, hx meningioma/craniotomy, hx seizures, hx SDH with right parietal encephalomalacia, asthma, anemia, bipolar disorder, and GI bleed who presented with generalized weakness and dyspnea with exertion, progressive for few months.  In ER, troponin level low and flat.  ECG showed new ST depressions in I and II, with TWF, new findings.  Case discussed with Cardiology who recommended deferring heparin gtt.      Assessment & Plan:  Acute on chronic diastolic CHF Patient reports new bilateral leg swelling, dyspnea on exertion. -Furosemide 40 mg IV twice a day  -K supplement -Strict I/Os, daily weights, telemetry  -Daily monitoring renal function  Failure to thrive Was very independent until last Dec.  Patient fell last fall, hit her head, and daughter noticed decline since then.  New seizure shortly after that in Dec 2019.  Since then, her memory has deteriorated terribly.  Her functional status has declined terribly (barely walks around).  In case some of this weakness and "feeling drugged' is related to Seroquel and depakote.  Will stop it and reduce Depakote for now. -PT eval  Exertional intolerance, possible anginal equivalent History of coronary disease Troponin elevated Admitting MD and EDP concerned for ischemia.  Cath in 2013 per records, no record of stent that I can see. Aspirin 325 given in ER  LDL <70 on atorvastatin No chest pain with exertion but dyspnea, but concern for anginal equivalent.  Cardiology overnight recommended not using heparin.   -Consult Cardiology -Obtain Echo -Monitor on telemetry -Continue home aspirin and atorvastatin -Continue home Imdur -Start metoprolol, low dose  Hypertension -Continue Imdur -Start metoprolol -Hold HCTZ  Seizures -Hold Depakote until  dose can be confirmed  Bipolar disorder -Continue fluoxetine, Seroquel  Hypokalemia Resolved  Anemia Mild, no clinical blood loss        MDM and disposition: The below labs and imaging reports were reviewed and summarized above.  Medication management as above.  The patient was admitted with weakness, chest pain.    Cardiology were consulted for tomorrow.  Will diurese and check renal function and K.  At this time, continued inpatient services are reasonable and expected, given the patient's: Severity of presentation: age, ongoing IV Lasix, continued severe dyspnea, and the high likelihood of an adverse outcome, including readmission, debility or death if the patient were to be discharged prematurely.        DVT prophylaxis: Lovenox Code Status: DO NOT RESUSCITATE Family Communication: Will call son    Consultants:   Cardiology for tomorrow  Procedures:   Echocardiogram 6/21 Normal EF Ungraded diastolic dysfunction  Antimicrobials:   None    Subjective: Feeling extremely tired, debilitated.  No confusion, fever, cough.  Notes leg sewlling.  Objective: Vitals:   01/31/19 0430 01/31/19 0530 01/31/19 0625 01/31/19 1331  BP: (!) 118/50 (!) 115/50 127/63 (!) 126/59  Pulse: 68 68 65 81  Resp: 17 15 15 16   Temp:   98.2 F (36.8 C) 98.7 F (37.1 C)  TempSrc:   Oral Oral  SpO2: 98% 98% 100% 97%  Weight:   75.5 kg   Height:   5\' 6"  (1.676 m)    No intake or output data in the 24 hours ending 01/31/19 1657 Filed Weights   01/30/19 1819 01/31/19 0625  Weight: 89.4 kg 75.5  kg    Examination: General appearance: elderly adult female, alert and in no acute distress.  Lying in bed HEENT: Anicteric, conjunctiva pink, lids and lashes normal. No nasal deformity, discharge, epistaxis.  Lips moist.   Skin: Warm and dry.  no jaundice.  No suspicious rashes or lesions. Cardiac: RRR, nl Q7-H4, loud systolic ejection murmur.  Capillary refill is brisk.  JVP not  visible.  2+ ankle LE edema.  Radial pulses 2+ and symmetric. Respiratory: Normal respiratory rate and rhythm.  CTAB without rales or wheezes. Abdomen: Abdomen soft.  No TTP. No ascites, distension, hepatosplenomegaly.   MSK: No deformities or effusions. Neuro: Awake and alert.  EOMI, moves all extremities. Speech fluent.    Psych: Sensorium intact and responding to questions, attention normal. Affect normal.  Judgment and insight appear normal.    Data Reviewed: I have personally reviewed following labs and imaging studies:  CBC: Recent Labs  Lab 01/30/19 1848 01/31/19 0419  WBC 8.6 8.0  NEUTROABS 5.5  --   HGB 12.5 11.5*  HCT 37.9 34.2*  MCV 96.7 95.3  PLT 149* 193   Basic Metabolic Panel: Recent Labs  Lab 01/30/19 1848 01/31/19 0419  NA 134* 136  K 3.1* 4.2  CL 100 102  CO2 21* 25  GLUCOSE 112* 99  BUN 22 19  CREATININE 0.58 0.64  CALCIUM 9.0 9.0  MG 2.0  --    GFR: Estimated Creatinine Clearance: 53.4 mL/min (by C-G formula based on SCr of 0.64 mg/dL). Liver Function Tests: Recent Labs  Lab 01/30/19 1848  AST 20  ALT 10  ALKPHOS 70  BILITOT 0.5  PROT 5.9*  ALBUMIN 2.6*   Recent Labs  Lab 01/30/19 1848  LIPASE 31   No results for input(s): AMMONIA in the last 168 hours. Coagulation Profile: No results for input(s): INR, PROTIME in the last 168 hours. Cardiac Enzymes: Recent Labs  Lab 01/30/19 1848 01/30/19 2237 01/31/19 0419 01/31/19 1029  TROPONINI 0.03* 0.03* 0.03* 0.04*   BNP (last 3 results) No results for input(s): PROBNP in the last 8760 hours. HbA1C: No results for input(s): HGBA1C in the last 72 hours. CBG: No results for input(s): GLUCAP in the last 168 hours. Lipid Profile: Recent Labs    01/31/19 0419  CHOL 99  HDL 30*  LDLCALC 57  TRIG 61  CHOLHDL 3.3   Thyroid Function Tests: Recent Labs    01/30/19 1848  TSH 1.566  FREET4 0.97   Anemia Panel: No results for input(s): VITAMINB12, FOLATE, FERRITIN, TIBC, IRON,  RETICCTPCT in the last 72 hours. Urine analysis:    Component Value Date/Time   COLORURINE YELLOW 01/30/2019 2155   APPEARANCEUR CLOUDY (A) 01/30/2019 2155   LABSPEC 1.018 01/30/2019 2155   PHURINE 7.0 01/30/2019 2155   GLUCOSEU NEGATIVE 01/30/2019 2155   HGBUR NEGATIVE 01/30/2019 2155   BILIRUBINUR NEGATIVE 01/30/2019 2155   BILIRUBINUR NEG 12/14/2015 1129   KETONESUR 5 (A) 01/30/2019 2155   PROTEINUR NEGATIVE 01/30/2019 2155   UROBILINOGEN 0.2 12/14/2015 1129   UROBILINOGEN 1.0 11/22/2012 0450   NITRITE NEGATIVE 01/30/2019 2155   LEUKOCYTESUR NEGATIVE 01/30/2019 2155   Sepsis Labs: @LABRCNTIP (procalcitonin:4,lacticacidven:4)  ) Recent Results (from the past 240 hour(s))  SARS Coronavirus 2 (CEPHEID- Performed in [place] hospital lab), Hosp Order     Status: None   Collection Time: 01/30/19  9:45 PM   Specimen: Nasopharyngeal Swab  Result Value Ref Range Status   SARS Coronavirus 2 NEGATIVE NEGATIVE Final    Comment: (NOTE)  If result is NEGATIVE SARS-CoV-2 target nucleic acids are NOT DETECTED. The SARS-CoV-2 RNA is generally detectable in upper and lower  respiratory specimens during the acute phase of infection. The lowest  concentration of SARS-CoV-2 viral copies this assay can detect is 250  copies / mL. A negative result does not preclude SARS-CoV-2 infection  and should not be used as the sole basis for treatment or other  patient management decisions.  A negative result may occur with  improper specimen collection / handling, submission of specimen other  than nasopharyngeal swab, presence of viral mutation(s) within the  areas targeted by this assay, and inadequate number of viral copies  (<250 copies / mL). A negative result must be combined with clinical  observations, patient history, and epidemiological information. If result is POSITIVE SARS-CoV-2 target nucleic acids are DETECTED. The SARS-CoV-2 RNA is generally detectable in upper and lower   respiratory specimens dur ing the acute phase of infection.  Positive  results are indicative of active infection with SARS-CoV-2.  Clinical  correlation with patient history and other diagnostic information is  necessary to determine patient infection status.  Positive results do  not rule out bacterial infection or co-infection with other viruses. If result is PRESUMPTIVE POSTIVE SARS-CoV-2 nucleic acids MAY BE PRESENT.   A presumptive positive result was obtained on the submitted specimen  and confirmed on repeat testing.  While 2019 novel coronavirus  (SARS-CoV-2) nucleic acids may be present in the submitted sample  additional confirmatory testing may be necessary for epidemiological  and / or clinical management purposes  to differentiate between  SARS-CoV-2 and other Sarbecovirus currently known to infect humans.  If clinically indicated additional testing with an alternate test  methodology [phone]) is advised. The SARS-CoV-2 RNA is generally  detectable in upper and lower respiratory sp ecimens during the acute  phase of infection. The expected result is Negative. Fact Sheet for Patients:  StrictlyIdeas.no Fact Sheet for Healthcare Providers: BankingDealers.co.za This test is not yet approved or cleared by the [place] FDA and has been authorized for detection and/or diagnosis of SARS-CoV-2 by FDA under an Emergency Use Authorization (EUA).  This EUA will remain in effect (meaning this test can be used) for the duration of the COVID-19 declaration under Section 564(b)(1) of the Act, 21 U.S.C. section 360bbb-3(b)(1), unless the authorization is terminated or revoked sooner. Performed at [facility], [place] [address] [place], [place] [address]          Radiology Studies: Dg Pelvis Portable  Result Date: 01/30/2019 CLINICAL DATA:  Weakness and decreased mobility. EXAM: PORTABLE PELVIS 1-2 VIEWS COMPARISON:   11/19/2012 radiographs FINDINGS: There is no evidence of pelvic fracture or diastasis. No pelvic bone lesions are seen. IMPRESSION: Negative. Electronically Signed   By: Margarette Canada M.D.   On: 01/30/2019 19:34   Dg Chest Portable 1 View  Result Date: 01/30/2019 CLINICAL DATA:  Weakness EXAM: PORTABLE CHEST 1 VIEW COMPARISON:  07/22/2018 and prior radiographs FINDINGS: The cardiomediastinal silhouette is unchanged with stable SUPERIOR mediastinal fullness. There is no evidence of focal airspace disease, pulmonary edema, suspicious pulmonary nodule/mass, pleural effusion, or pneumothorax. No acute bony abnormalities are identified. RIGHT shoulder arthroplasty changes again noted. IMPRESSION: No acute abnormality. Electronically Signed   By: Margarette Canada M.D.   On: 01/30/2019 19:33        Scheduled Meds: . aspirin EC  81 mg Oral Daily  . atorvastatin  40 mg Oral q1800  . enoxaparin (LOVENOX) injection  40 mg Subcutaneous Daily  . FLUoxetine  40 mg Oral Daily  . isosorbide mononitrate  15 mg Oral Daily  . metoprolol tartrate  12.5 mg Oral BID  . QUEtiapine  25 mg Oral QHS   Continuous Infusions:   LOS: 0 days    Time spent: 25 minutes    Edwin Dada, MD [facility] 01/31/2019, 4:57 PM     Please page through [place]:  [email or web address] Password [email or web address] If 7PM-7AM, please contact night-coverage

## 2019-01-31 NOTE — ED Notes (Signed)
ED TO INPATIENT HANDOFF REPORT  ED Nurse Name and Phone #: [phone]  S Name/Age/Gender [name] 85 y.o. female Room/Bed: 019C/019C  Code Status   Code Status: DNR  Home/SNF/Other DC home {Patient oriented to: AO x4 Is this baseline? Yes  Triage Complete: Triage complete  Chief Complaint Decrease In Mobility  Triage Note [facility]- pt here for decreased mobility over the last month. Pt states she feels weak. Pt had her Depakote increased recently and unsure if this is causing her problem.   106 CBG 97.5 temp 76 HR 130/72 96% 2L   Allergies Allergies  Allergen Reactions  . Amoxicillin Anaphylaxis  . Penicillins Anaphylaxis and Swelling    From childhood: Has patient had a PCN reaction causing immediate rash, facial/tongue/throat swelling, SOB or lightheadedness with hypotension: Yes Has patient had a PCN reaction causing severe rash involving mucus membranes or skin necrosis: Unk Has patient had a PCN reaction that required hospitalization: No Has patient had a PCN reaction occurring within the last 10 years: No If all of the above answers are "NO", then may proceed with Cephalosporin use. .  [name] [Levetiracetam]     Attributes worsening anxiety to keppra.  . Phenobarbital Other (See Comments)    Reaction not recalled by the patient  . Phenytoin Other (See Comments)    "Makes me feel funny"   . Tape Rash    Please use paper tape    Level of Care/Admitting Diagnosis ED Disposition    ED Disposition Condition [facility] Area: [place] [[id number]]  Level of Care: Telemetry Cardiac [103]  I expect the patient will be discharged within 24 hours: Yes  LOW acuity---Tx typically complete <24 hrs---ACUTE conditions typically can be evaluated <24 hours---LABS likely to return to acceptable levels <24 hours---IS near functional baseline---EXPECTED to return to current living arrangement---NOT newly hypoxic: Meets criteria for  5C-Observation unit  Covid Evaluation: Person Under Investigation (PUI)  Isolation Risk Level: Low Risk/Droplet (Less than 4L Baird supplementation)  Diagnosis: SOB (shortness of breath) [628315]  Admitting Physician: Shela Leff [[id number]]  Attending Physician: Shela Leff [[id number]]  PT Class (Do Not Modify): Observation [104]  PT Acc Code (Do Not Modify): Observation [10022]       B Medical/Surgery History Past Medical History:  Diagnosis Date  . Anemia   . Asthma    once in 1950's  . AVM (arteriovenous malformation)   . Bipolar disorder ([place]) 10/13/2018  . Cancer (HCC)     right leg  skin  . Coronary artery disease   . Depression    sucide attempt in the distant past  . Excessive sweating   . GI bleed   . Hiatal hernia    s/p repair  . HLD (hyperlipidemia)   . HTN (hypertension)   . Hypertension   . Low back pain    ESI by Dr. Nelva Bush 2 weeks ago  . OA (osteoarthritis)    bilateral wrist, bilateral knees  . Obesity   . Obesity   . Ovarian cyst   . Right wrist injury    multiple surgeries and residual weakness.   . Rotator cuff arthropathy    right  . Seizures ([place]) 1980s   2/2 meningioma. none since cranioplasty  . Tuberculosis    + tb test      (father had)   Past Surgical History:  Procedure Laterality Date  . ABDOMINAL HYSTERECTOMY     left ovaries  . BREAST REDUCTION  SURGERY    . CATARACT EXTRACTION Bilateral   . CRANIOPLASTY     2/2 meningioma 1980s  . CYSTECTOMY     rt ovary  age 72   . HIATAL HERNIA REPAIR    . KNEE ARTHROPLASTY     bilat  . LEFT HEART CATHETERIZATION WITH CORONARY ANGIOGRAM N/A 03/26/2012   Procedure: LEFT HEART CATHETERIZATION WITH CORONARY ANGIOGRAM;  Surgeon: Minus Breeding, MD;  Location: [facility] CATH LAB;  Service: Cardiovascular;  Laterality: N/A;  . REVERSE SHOULDER ARTHROPLASTY Right 03/29/2016  . REVERSE SHOULDER ARTHROPLASTY Right 03/29/2016   Procedure: RIGHT REVERSE SHOULDER ARTHROPLASTY;  Surgeon: Netta Cedars,  MD;  Location: [facility];  Service: Orthopedics;  Laterality: Right;     A IV Location/Drains/Wounds Patient Lines/Drains/Airways Status   Active Line/Drains/Airways    Name:   Placement date:   Placement time:   Site:   Days:   Peripheral IV 01/30/19 Right Wrist   01/30/19    2002    Wrist   1          Intake/Output Last 24 hours No intake or output data in the 24 hours ending 01/31/19 0151  Labs/Imaging Results for orders placed or performed during the hospital encounter of 01/30/19 (from the past 48 hour(s))  CBC with Differential     Status: Abnormal   Collection Time: 01/30/19  6:48 PM  Result Value Ref Range   WBC 8.6 4.0 - 10.5 K/uL   RBC 3.92 3.87 - 5.11 MIL/uL   Hemoglobin 12.5 12.0 - 15.0 g/dL   HCT 37.9 36.0 - 46.0 %   MCV 96.7 80.0 - 100.0 fL   MCH 31.9 26.0 - 34.0 pg   MCHC 33.0 30.0 - 36.0 g/dL   RDW 13.7 11.5 - 15.5 %   Platelets 149 (L) 150 - 400 K/uL    Comment: REPEATED TO VERIFY   nRBC 0.0 0.0 - 0.2 %   Neutrophils Relative % 63 %   Neutro Abs 5.5 1.7 - 7.7 K/uL   Lymphocytes Relative 20 %   Lymphs Abs 1.7 0.7 - 4.0 K/uL   Monocytes Relative 15 %   Monocytes Absolute 1.3 (H) 0.1 - 1.0 K/uL   Eosinophils Relative 1 %   Eosinophils Absolute 0.1 0.0 - 0.5 K/uL   Basophils Relative 0 %   Basophils Absolute 0.0 0.0 - 0.1 K/uL   Immature Granulocytes 1 %   Abs Immature Granulocytes 0.06 0.00 - 0.07 K/uL    Comment: Performed at [facility], [address], [place], [place] [address]  Basic metabolic panel     Status: Abnormal   Collection Time: 01/30/19  6:48 PM  Result Value Ref Range   Sodium 134 (L) 135 - 145 mmol/L   Potassium 3.1 (L) 3.5 - 5.1 mmol/L   Chloride 100 98 - 111 mmol/L   CO2 21 (L) 22 - 32 mmol/L   Glucose, Bld 112 (H) 70 - 99 mg/dL   BUN 22 8 - 23 mg/dL   Creatinine, Ser 0.58 0.44 - 1.00 mg/dL   Calcium 9.0 8.9 - 10.3 mg/dL   GFR calc non Af Amer >60 >60 mL/min   GFR calc Af Amer >60 >60 mL/min   Anion gap 13 5 - 15     Comment: Performed at [facility] [address], [place], [place] [address]  Hepatic function panel     Status: Abnormal   Collection Time: 01/30/19  6:48 PM  Result Value Ref Range  Total Protein 5.9 (L) 6.5 - 8.1 g/dL   Albumin 2.6 (L) 3.5 - 5.0 g/dL   AST 20 15 - 41 U/L   ALT 10 0 - 44 U/L   Alkaline Phosphatase 70 38 - 126 U/L   Total Bilirubin 0.5 0.3 - 1.2 mg/dL   Bilirubin, Direct 0.2 0.0 - 0.2 mg/dL   Indirect Bilirubin 0.3 0.3 - 0.9 mg/dL    Comment: Performed at [facility] [address] [place], [place] [address]  Magnesium     Status: None   Collection Time: 01/30/19  6:48 PM  Result Value Ref Range   Magnesium 2.0 1.7 - 2.4 mg/dL    Comment: Performed at [facility], [place] [address] [place], [place] [address]  Troponin I - ONCE - STAT     Status: Abnormal   Collection Time: 01/30/19  6:48 PM  Result Value Ref Range   Troponin I 0.03 (HH) <0.03 ng/mL    Comment: CRITICAL RESULT CALLED TO, READ BACK BY AND VERIFIED WITH: Ferd Hibbs RN AT 2020 01/30/2019 BY [facility] Performed at [facility], [address] [place], [place] [address]   T4, free     Status: None   Collection Time: 01/30/19  6:48 PM  Result Value Ref Range   Free T4 0.97 0.61 - 1.12 ng/dL    Comment: (NOTE) Biotin ingestion may interfere with free T4 tests. If the results are inconsistent with the TSH level, previous test results, or the clinical presentation, then consider biotin interference. If needed, order repeat testing after stopping biotin. Performed at [facility], [place] [address], [place], [place] [address]   Lipase, blood     Status: None   Collection Time: 01/30/19  6:48 PM  Result Value Ref Range   Lipase 31 11 - 51 U/L    Comment: Performed at [facility] [address], [place], [place] [address]  TSH     Status: None   Collection Time: 01/30/19  6:48 PM  Result Value Ref Range   TSH 1.566 0.350 - 4.500 uIU/mL    Comment:  Performed by a 3rd Generation assay with a functional sensitivity of <=0.01 uIU/mL. Performed at [facility], [place] [address] [place], [place] [address]   Brain natriuretic peptide     Status: Abnormal   Collection Time: 01/30/19  6:49 PM  Result Value Ref Range   B Natriuretic Peptide 271.4 (H) 0.0 - 100.0 pg/mL    Comment: Performed at [facility] [address] [place], [place] [address]  Valproic acid level     Status: None   Collection Time: 01/30/19  8:11 PM  Result Value Ref Range   Valproic Acid Lvl 51 50.0 - 100.0 ug/mL    Comment: Performed at [facility] [address] [place],  [address]  Urinalysis, Routine w reflex microscopic     Status: Abnormal   Collection Time: 01/30/19  9:55 PM  Result Value Ref Range   Color, Urine YELLOW YELLOW   APPearance CLOUDY (A) CLEAR   Specific Gravity, Urine 1.018 1.005 - 1.030   pH 7.0 5.0 - 8.0   Glucose, UA NEGATIVE NEGATIVE mg/dL   Hgb urine dipstick NEGATIVE NEGATIVE   Bilirubin Urine NEGATIVE NEGATIVE   Ketones, ur 5 (A) NEGATIVE mg/dL   Protein, ur NEGATIVE NEGATIVE mg/dL   Nitrite NEGATIVE NEGATIVE   Leukocytes,Ua NEGATIVE NEGATIVE    Comment: Performed  at [facility], [place] [address], [place], [place] [address]  Troponin I - Now Then Q6H     Status: Abnormal   Collection Time: 01/30/19 10:37 PM  Result Value Ref Range   Troponin I 0.03 (HH) <0.03 ng/mL    Comment: CRITICAL VALUE NOTED.  VALUE IS CONSISTENT WITH PREVIOUSLY REPORTED AND CALLED VALUE. Performed at [facility], [place] [address], [place], [place] [address]    Dg Pelvis Portable  Result Date: 01/30/2019 CLINICAL DATA:  Weakness and decreased mobility. EXAM: PORTABLE PELVIS 1-2 VIEWS COMPARISON:  11/19/2012 radiographs FINDINGS: There is no evidence of pelvic fracture or diastasis. No pelvic bone lesions are seen. IMPRESSION: Negative. Electronically Signed   By: Margarette Canada M.D.   On: 01/30/2019 19:34   Dg Chest  Portable 1 View  Result Date: 01/30/2019 CLINICAL DATA:  Weakness EXAM: PORTABLE CHEST 1 VIEW COMPARISON:  07/22/2018 and prior radiographs FINDINGS: The cardiomediastinal silhouette is unchanged with stable SUPERIOR mediastinal fullness. There is no evidence of focal airspace disease, pulmonary edema, suspicious pulmonary nodule/mass, pleural effusion, or pneumothorax. No acute bony abnormalities are identified. RIGHT shoulder arthroplasty changes again noted. IMPRESSION: No acute abnormality. Electronically Signed   By: Margarette Canada M.D.   On: 01/30/2019 19:33    Pending Labs Unresulted Labs (From admission, onward)    Start     Ordered   01/31/19 0500  CBC  Tomorrow morning,   R     01/30/19 2210   01/31/19 6568  Basic metabolic panel  Tomorrow morning,   R     01/30/19 2210   01/31/19 0500  Lipid panel  Tomorrow morning,   R     01/30/19 2210   01/30/19 2210  Troponin I - Now Then Q6H  Now then every 6 hours,   R (with STAT occurrences)     01/30/19 2210   01/30/19 2134  Novel Coronavirus,NAA,(SEND-OUT TO REF LAB - TAT 24-48 hrs); Hosp Order  (Symptomatic Patients Labs with Precautions )  Once,   STAT    Question Answer Comment  Current symptoms Fever and Shortness of breath   Excluded other viral illnesses No (testing not indicated)      01/30/19 2133          Vitals/Pain Today's Vitals   01/30/19 2115 01/30/19 2226 01/30/19 2245 01/31/19 0145  BP: 133/65  (!) 144/74 (!) 103/51  Pulse:  75 78   Resp: 18 16 (!) 21 15  Temp:      TempSrc:      SpO2:  97% 98%   Weight:      Height:      PainSc:        Isolation Precautions Droplet and Contact precautions  Medications Medications  acetaminophen (TYLENOL) tablet 650 mg (has no administration in time range)  ondansetron (ZOFRAN) injection 4 mg (has no administration in time range)  enoxaparin (LOVENOX) injection 40 mg (has no administration in time range)  albuterol (VENTOLIN HFA) 108 (90 Base) MCG/ACT inhaler 1-2 puff  (has no administration in time range)  hydrALAZINE (APRESOLINE) injection 5 mg (has no administration in time range)  potassium chloride SA (K-DUR) CR tablet 40 mEq (40 mEq Oral Given 01/30/19 2243)  aspirin chewable tablet 324 mg (324 mg Oral Given 01/30/19 2242)    Mobility  One assistance use walker Moderate fall risk   Focused Assessments SR on the monitor neuro assessment intact lungs sound diminish trop 0.03 x 2.   R Recommendations:  See Admitting Provider Note  Report given to:   Additional Notes:

## 2019-01-31 NOTE — Progress Notes (Signed)
Pt asking for her glasses that she had in the ED last night.  Glasses are not in 4E room 25.  Called ED and inquired about pt's glasses.  Was transferred to Security who did not pick up the phone.  Will try again shortly.

## 2019-02-01 DIAGNOSIS — D696 Thrombocytopenia, unspecified: Secondary | ICD-10-CM

## 2019-02-01 DIAGNOSIS — R531 Weakness: Secondary | ICD-10-CM

## 2019-02-01 DIAGNOSIS — R0602 Shortness of breath: Secondary | ICD-10-CM

## 2019-02-01 LAB — CBC
HCT: 35.5 % — ABNORMAL LOW (ref 36.0–46.0)
Hemoglobin: 11.9 g/dL — ABNORMAL LOW (ref 12.0–15.0)
MCH: 31.7 pg (ref 26.0–34.0)
MCHC: 33.5 g/dL (ref 30.0–36.0)
MCV: 94.7 fL (ref 80.0–100.0)
Platelets: 153 10*3/uL (ref 150–400)
RBC: 3.75 MIL/uL — ABNORMAL LOW (ref 3.87–5.11)
RDW: 13.8 % (ref 11.5–15.5)
WBC: 7.8 10*3/uL (ref 4.0–10.5)
nRBC: 0 % (ref 0.0–0.2)

## 2019-02-01 LAB — BASIC METABOLIC PANEL
Anion gap: 8 (ref 5–15)
BUN: 16 mg/dL (ref 8–23)
CO2: 25 mmol/L (ref 22–32)
Calcium: 8.5 mg/dL — ABNORMAL LOW (ref 8.9–10.3)
Chloride: 99 mmol/L (ref 98–111)
Creatinine, Ser: 0.56 mg/dL (ref 0.44–1.00)
GFR calc Af Amer: >60 mL/min (ref >60)
GFR calc non Af Amer: >60 mL/min (ref >60)
Glucose, Bld: 124 mg/dL — ABNORMAL HIGH (ref 70–99)
Potassium: 3.7 mmol/L (ref 3.5–5.1)
Sodium: 132 mmol/L — ABNORMAL LOW (ref 135–145)

## 2019-02-01 MED ORDER — DIVALPROEX SODIUM 500 MG PO DR TAB
500.0000 mg | DELAYED_RELEASE_TABLET | Freq: Every day | ORAL | Status: DC
  Administered 2019-02-01 – 2019-02-04 (×4): 500 mg via ORAL
  Filled 2019-02-01 (×4): qty 1

## 2019-02-01 MED ORDER — BENZOCAINE 20 % MT AERO
INHALATION_SPRAY | Freq: Four times a day (QID) | OROMUCOSAL | Status: DC | PRN
  Filled 2019-02-01: qty 57

## 2019-02-01 MED ORDER — QUETIAPINE FUMARATE 25 MG PO TABS
12.5000 mg | ORAL_TABLET | Freq: Every day | ORAL | Status: DC
  Administered 2019-02-01 – 2019-02-03 (×3): 12.5 mg via ORAL
  Filled 2019-02-01 (×3): qty 1

## 2019-02-01 MED ORDER — DIVALPROEX SODIUM 500 MG PO DR TAB: 1000.0000 mg | DELAYED_RELEASE_TABLET | Freq: Every evening | ORAL | Status: DC

## 2019-02-01 MED ORDER — DIVALPROEX SODIUM 500 MG PO DR TAB: 500.0000 mg | DELAYED_RELEASE_TABLET | Freq: Every evening | ORAL | Status: DC

## 2019-02-01 MED ORDER — FLUOXETINE HCL 20 MG PO CAPS
20.0000 mg | ORAL_CAPSULE | Freq: Every day | ORAL | Status: DC
  Administered 2019-02-01 – 2019-02-04 (×4): 20 mg via ORAL
  Filled 2019-02-01 (×4): qty 1

## 2019-02-01 MED ORDER — DIVALPROEX SODIUM 500 MG PO DR TAB
500.0000 mg | DELAYED_RELEASE_TABLET | Freq: Two times a day (BID) | ORAL | Status: DC
  Filled 2019-02-01: qty 1

## 2019-02-01 MED ORDER — DIVALPROEX SODIUM 500 MG PO DR TAB
1000.0000 mg | DELAYED_RELEASE_TABLET | Freq: Every day | ORAL | Status: DC
  Administered 2019-02-01 – 2019-02-03 (×3): 1000 mg via ORAL
  Filled 2019-02-01 (×4): qty 2

## 2019-02-01 NOTE — Evaluation (Signed)
Occupational Therapy Evaluation Patient Details Name: [name] MRN: [id number] DOB: 01-11-1931 Today's Date: 02/01/2019    History of Present Illness [name] is a 85 y.o. 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  Patient reports having generalized weakness and dyspnea on exertion for the past few months.    Clinical Impression   Patient presenting with decreased I in self care, mobility, functional transfers, balance, strength, endurance, and safety awareness. Patient reports living at home alone PTA without use of AD. She had caring hands service come 4 hours Mon - Fri to "do what ever I needed them to do".  Patient currently functioning max A. Pt needing max A for sit <>stand and to transfer from bed >wheelchair with posterior bias and increased pain in L shoulder. Patient will benefit from acute OT to increase overall independence in the areas of ADLs, functional mobility, and safety awareness in order to safely discharge to next venue of care to address deficits.     Follow Up Recommendations  SNF;Supervision/Assistance - 24 hour    Equipment Recommendations  Other (comment)(defer to next venue of care)    Recommendations for Other Services (none at this time)     Precautions / Restrictions Precautions Precautions: Fall Precaution Comments: Reports many falls at home recently Restrictions Weight Bearing Restrictions: No      Mobility Bed Mobility Overal bed mobility: Needs Assistance Bed Mobility: Supine to Sit     Supine to sit: Mod assist        Transfers Overall transfer level: Needs assistance Equipment  used: Rolling walker (2 wheeled) Transfers: Sit to/from Stand Sit to Stand: Max assist         General transfer comment: max A to stand from bed with RW secondary to increased L shoulder pain    Balance Overall balance assessment: History of Falls              ADL either performed or assessed with clinical judgement   ADL Overall ADL's : Needs assistance/impaired     Grooming: Sitting;Set up;Wash/dry hands;Wash/dry face;Oral care;Brushing hair   Upper Body Bathing: Minimal assistance;Sitting   Lower Body Bathing: Maximal assistance;Sit to/from stand   Upper Body Dressing : Minimal assistance;Sitting   Lower Body Dressing: Maximal assistance;Total assistance;Sit to/from stand   Toilet Transfer: Maximal assistance;BSC;RW;Stand-pivot   Toileting- Clothing Manipulation and Hygiene: Total assistance;Sit to/from stand               Vision Baseline Vision/History: Wears glasses Patient Visual Report: Diplopia;Other (comment)(Pt reporting double vision and keeping R eye closed throughout session) Vision Assessment?: Vision impaired- to be further tested in functional context            Pertinent Vitals/Pain Pain Assessment: Faces Faces Pain Scale: Hurts even more Pain Location: reports pain "all over" Pain Descriptors / Indicators: Grimacing;Guarding Pain Intervention(s): Limited activity within patient's tolerance;Monitored during session;Repositioned     Hand Dominance Right   Extremity/Trunk Assessment Upper Extremity Assessment Upper Extremity Assessment: LUE deficits/detail LUE Deficits / Details: L shoulder pain with movement; pt tells me she nees a L shoulder replacement LUE: Unable to fully assess due to pain   Lower Extremity Assessment Lower Extremity Assessment: Defer to PT evaluation  Cervical / Trunk Assessment Cervical / Trunk Assessment: Kyphotic   Communication Communication Communication: No difficulties   Cognition  Arousal/Alertness: Awake/alert Behavior During Therapy: Flat affect Overall Cognitive Status: Within Functional Limits for tasks assessed                  Home Living Family/patient expects to be discharged to:: Private residence Living Arrangements: Alone Available Help at Discharge: Friend(s);Available PRN/intermittently;Neighbor Type of Home: House Home Access: Stairs to enter [employer] of Steps: 1   Home Layout: One level     Bathroom Shower/Tub: Occupational psychologist: Handicapped height     Home Equipment: Environmental consultant - 2 wheels;Shower seat   Additional Comments: She reports she has two daughters who live in [place]      Prior Functioning/Environment Level of Independence: Independent        Comments: Did not report using an assistive device, but did report that she has had frequent falls        OT Problem List: Decreased strength;Decreased coordination;Pain;Decreased activity tolerance;Decreased safety awareness;Impaired balance (sitting and/or standing);Impaired vision/perception;Impaired UE functional use      OT Treatment/Interventions: Self-care/ADL training;Therapeutic exercise;Therapeutic activities;Neuromuscular education;Energy conservation;DME and/or AE instruction;Patient/family education;Balance training;Modalities    OT Goals(Current goals can be found in the care plan section) Acute Rehab OT Goals Patient Stated Goal: to get better OT Goal Formulation: With patient Time For Goal Achievement: 02/15/19 Potential to Achieve Goals: Good ADL Goals Pt Will Perform Grooming: with supervision Pt Will Perform Upper Body Bathing: with supervision Pt Will Perform Lower Body Bathing: with supervision Pt Will Perform Upper Body Dressing: with supervision Pt Will Perform Lower Body Dressing: with supervision Pt Will Transfer to Toilet: with supervision Pt Will Perform Toileting - Clothing Manipulation and hygiene: with  supervision Pt Will Perform Tub/Shower Transfer: with supervision  OT Frequency: Min 2X/week   Barriers to D/C: Decreased caregiver support             AM-PAC OT "6 Clicks" Daily Activity     Outcome Measure Help from another person eating meals?: A Little Help from another person taking care of personal grooming?: A Little Help from another person toileting, which includes using toliet, bedpan, or urinal?: A Lot Help from another person bathing (including washing, rinsing, drying)?: A Lot Help from another person to put on and taking off regular upper body clothing?: A Little Help from another person to put on and taking off regular lower body clothing?: A Lot 6 Click Score: 15   End of Session Equipment Utilized During Treatment: Rolling walker Nurse Communication: Mobility status;Precautions  Activity Tolerance: Patient limited by fatigue Patient left: in chair;with call bell/phone within reach;with chair alarm set  OT Visit Diagnosis: Repeated falls (R29.6);Muscle weakness (generalized) (M62.81);History of falling (Z91.81)                Time: 3007-6226 OT Time Calculation (min): 22 min Charges:  OT General Charges $OT Visit: 1 Visit OT Evaluation $OT Eval Moderate Complexity: 1 Mod  Amberle Lyter P, MS, OTR/L 02/01/2019, 1:28 PM

## 2019-02-01 NOTE — Progress Notes (Signed)
Pt daughter, [name], updated on patient condition via phone. All questions answered.  Clyde Canterbury, RN

## 2019-02-01 NOTE — Consult Note (Addendum)
Cardiology Consultation:   Patient ID: [name] MRN: [id number]; DOB: 06/28/1931  Admit date: 01/30/2019 Date of Consult: 02/01/2019  Primary Care Provider: [name], MD Primary Cardiologist: Elouise Munroe, MD   Patient Profile:   [name] is a 85 y.o. female with a hx of CAD (medically managed), meningioma status post R craniotomy, SDH with resulting right parietal encephalomalacia, tricuspid regurgitation, Sz d/o, HTN, HLD, depression, falls and prior suicidal Ideation who is being seen today for the evaluation of DOE at the request of Dr. Grandville Silos.   Admitted 12/05-12/07/2018 for Sz, Todd's paralysis, sepsis, PNA, trop peak 1.16, seen by cards and EF nl on echo w/ severe TR and PAS 62, CT cardiac w/ mod dz incl dCFX>>med rx. During admission, EKG read as atrial fibrillation at rate of 128 bpm however felt like sinus tachycardia by Dr. Margaretann Loveless. No clear indication for [facility].   Last seen by Rosaria Ferries 09/11/2018 for hospital follow up. Concern regarding medication compliance.   History of Present Illness:   Ms. Klumb presented progressive worsening dyspnea on exertion and generalized weakness for past few months.  Lives by herself.  Uses walker.  Poor historian.  Patient reports progressive worsening dyspnea.  Only able to walk inside her house with frequent stop.  She denies any exertional chest pain.  Patient reports intermittent palpitation lasting for few minutes.  She denies orthopnea, PND, syncope or dizziness.  She was admitted for further evaluation.  Troponin mildly flat trend 0.03 x 3 >> 0.04.  BNP 271.  Low albumin.  Chest X-ray without acute abnormality.  COVID negative.  EKG on dmission showed sinus rhythm at rate of 73 bpm, LVH and ST/T wave changes in inferior lateral leads -personally reviewed.  Repeat EKG this morning showed resolution of ST/T wave changes.  Given IV lasix 40mg  x 1. She was diuresed 650 cc.  Echo yesterday:  1. The left  ventricle has hyperdynamic systolic function, with an ejection fraction of >65%. The cavity size was normal. Left ventricular diastolic Doppler parameters are consistent with impaired relaxation.  2. The right ventricle has normal systolic function. The cavity was normal. There is no increase in right ventricular wall thickness.  3. There is moderate mitral annular calcification present.  4. The aortic valve is tricuspid. Moderate thickening of the aortic valve. Moderate calcification of the aortic valve. Aortic valve regurgitation was not assessed by color flow Doppler.  Past Medical History:  Diagnosis Date  . Anemia   . Asthma    once in 1950's  . AVM (arteriovenous malformation)   . Bipolar disorder ([place]) 10/13/2018  . Cancer (HCC)     right leg  skin  . Coronary artery disease   . Depression    sucide attempt in the distant past  . Excessive sweating   . GI bleed   . Hiatal hernia    s/p repair  . HLD (hyperlipidemia)   . HTN (hypertension)   . Hypertension   . Low back pain    ESI by Dr. Nelva Bush 2 weeks ago  . OA (osteoarthritis)    bilateral wrist, bilateral knees  . Obesity   . Obesity   . Ovarian cyst   . Right wrist injury    multiple surgeries and residual weakness.   . Rotator cuff arthropathy    right  . Seizures ([place]) 1980s   2/2 meningioma. none since cranioplasty  . Tuberculosis    + tb test      (  father had)    Past Surgical History:  Procedure Laterality Date  . ABDOMINAL HYSTERECTOMY     left ovaries  . BREAST REDUCTION SURGERY    . CATARACT EXTRACTION Bilateral   . CRANIOPLASTY     2/2 meningioma 1980s  . CYSTECTOMY     rt ovary  age 31   . HIATAL HERNIA REPAIR    . KNEE ARTHROPLASTY     bilat  . LEFT HEART CATHETERIZATION WITH CORONARY ANGIOGRAM N/A 03/26/2012   Procedure: LEFT HEART CATHETERIZATION WITH CORONARY ANGIOGRAM;  Surgeon: Minus Breeding, MD;  Location[facility] CATH LAB;  Service: Cardiovascular;  Laterality: N/A;  . REVERSE SHOULDER  ARTHROPLASTY Right 03/29/2016  . REVERSE SHOULDER ARTHROPLASTY Right 03/29/2016   Procedure: RIGHT REVERSE SHOULDER ARTHROPLASTY;  Surgeon: Netta Cedars, MD;  Location: [place];  Service: Orthopedics;  Laterality: Right;     Inpatient Medications: Scheduled Meds: . aspirin EC  81 mg Oral Daily  . atorvastatin  40 mg Oral q1800  . divalproex  500 mg Oral Q12H  . enoxaparin (LOVENOX) injection  40 mg Subcutaneous Daily  . FLUoxetine  40 mg Oral Daily  . isosorbide mononitrate  15 mg Oral Daily  . metoprolol tartrate  12.5 mg Oral BID  . QUEtiapine  25 mg Oral QHS   Continuous Infusions:  PRN Meds: acetaminophen, albuterol, Benzocaine, hydrALAZINE, ondansetron (ZOFRAN) IV  Allergies:    Allergies  Allergen Reactions  . Amoxicillin Anaphylaxis  . Penicillins Anaphylaxis and Swelling    From childhood: Has patient had a PCN reaction causing immediate rash, facial/tongue/throat swelling, SOB or lightheadedness with hypotension: Yes Has patient had a PCN reaction causing severe rash involving mucus membranes or skin necrosis: Unk Has patient had a PCN reaction that required hospitalization: No Has patient had a PCN reaction occurring within the last 10 years: No If all of the above answers are "NO", then may proceed with Cephalosporin use. .  [name] [Levetiracetam]     Attributes worsening anxiety to keppra.  . Phenobarbital Other (See Comments)    Reaction not recalled by the patient  . Phenytoin Other (See Comments)    "Makes me feel funny"   . Tape Rash    Please use paper tape    Social History:   Social History   Socioeconomic History  . Marital status: Divorced    Spouse name: Not on file  . Number of children: Not on file  . Years of education: Not on file  . Highest education level: Not on file  Occupational History  . Occupation: Retired- Health visitor  Social Needs  . Financial resource strain: Not on file  . Food insecurity    Worry: Not on file    Inability:  Not on file  . Transportation needs    Medical: Not on file    Non-medical: Not on file  Tobacco Use  . Smoking status: Never Smoker  . Smokeless tobacco: Never Used  Substance and Sexual Activity  . Alcohol use: No  . Drug use: No  . Sexual activity: Never  Lifestyle  . Physical activity    Days per week: Not on file    Minutes per session: Not on file  . Stress: Not on file  Relationships  . Social Herbalist on phone: Not on file    Gets together: Not on file    Attends religious service: Not on file    Active member of club or organization: Not on file  Attends meetings of clubs or organizations: Not on file    Relationship status: Not on file  . Intimate partner violence    Fear of current or ex partner: Not on file    Emotionally abused: Not on file    Physically abused: Not on file    Forced sexual activity: Not on file  Other Topics Concern  . Not on file  Social History Narrative   Patient identified her friend, [name], as her emergency contact at [phone].   Lives   Caffeine use:     Family History:   Family History  Problem Relation Age of Onset  . Alzheimer's disease Mother   . Heart disease Father        MI 69yo  . Heart disease Brother      ROS:  Please see the history of present illness.  All other ROS reviewed and negative.     Physical Exam/Data:   Vitals:   02/01/19 0700 02/01/19 0819 02/01/19 0833 02/01/19 0900  BP:  (!) 167/74    Pulse: 64 68 69 65  Resp: 16 15  20   Temp:  98.3 F (36.8 C)    TempSrc:  Oral    SpO2: 94% 95%  96%  Weight:      Height:        Intake/Output Summary (Last 24 hours) at 02/01/2019 1016 Last data filed at 01/31/2019 2100 Gross per 24 hour  Intake 350 ml  Output 1000 ml  Net -650 ml   Last 3 Weights 02/01/2019 01/31/2019 01/30/2019  Weight (lbs) 172 lb 2.9 oz 166 lb 7.2 oz 197 lb  Weight (kg) 78.1 kg 75.5 kg 89.359 kg     Body mass index is 27.79 kg/m.  General: Frail ill  appearing female in no acute distress  HEENT: normal Lymph: no adenopathy Neck: no JVD Endocrine:  No thryomegaly Vascular: No carotid bruits; FA pulses 2+ bilaterally without bruits  Cardiac:  normal S1, S2; RRR; positive systolic murmur Lungs:  clear to auscultation bilaterally, no wheezing, rhonchi or rales  Abd: soft, nontender, no hepatomegaly  Ext: Puffy edema Musculoskeletal:  No deformities, BUE and BLE strength normal and equal Skin: warm and dry  Neuro:  CNs 2-12 intact, no focal abnormalities noted Psych:  Normal affect    Telemetry:  Telemetry was personally reviewed and demonstrates: Sinus rhythm at rate of 70s  Relevant CV Studies:  ECHO: 07/18/2018 - Left ventricle: The cavity size was normal. There was moderate hyperterophy of the basal septum and otherwise mild concentric LVH. Systolic function was normal. The estimated ejection fraction was in the range of 60% to 65%. Wall motion was normal; there were no regional wall motion abnormalities. Doppler parameters are consistent with abnormal left ventricular relaxation (grade 1 diastolic dysfunction). Doppler parameters are consistent with high ventricular filling pressure. - Aortic valve: Valve mobility was restricted. Transvalvular velocity was within the normal range. There was no stenosis. There was no regurgitation. - Mitral valve: Calcified annulus. Transvalvular velocity was within the normal range. There was no evidence for stenosis. There was mild regurgitation. - Left atrium: The atrium was severely dilated. - Right ventricle: The cavity size was normal. Wall thickness was normal. Systolic function was normal. - Tricuspid valve: There was severe regurgitation. - Pulmonary arteries: Systolic pressure was severely increased. PA peak pressure: 62 mm Hg (S).   CARDIAC CT: 07/20/2018 Coronary Arteries: Normal coronary origin. Right dominance.  RCA is a large dominant  artery that gives rise  to PDA and PLVB. There is minimal (1-24%) calcified plaque in the proximal and mid RCA.  Left main is a large artery that gives rise to LAD and LCX arteries.  LAD is a large vessel that gives rise to two diagonal branches. There is minimal (1-24%) calcified plaque in the proximal to mid LAD. There is moderate (50-69%) calcified plaque in proximal D2. This may be overestimated due to blooming artifact. There is minimal calcified plaque in D1.  LCX is a non-dominant artery that gives rise to one large OM1 branch. There is diffuse, non-obstructing calcified plaque in proximal to mid LCX.  Other findings:  Normal pulmonary vein drainage into the left atrium.  Normal let atrial appendage without a thrombus.  Normal size of the pulmonary artery.  Small PFO noted.  IMPRESSION: 1. Coronary calcium score of 641. Age and sex matched comparison is only validated through age 26. Assuming age 78, this was 79th percentile for age and sex matched control.  2. Normal coronary origin with right dominance.  3. Diffuse calcified plaque with possible obstructive disease in the LAD territory.  4. Will send study for FFRct.  1. Left Main: No significant stenosis. FFRct 0.97  2. LAD: No significant stenosis. FFRct proximal 0.95, distall 0.97. D2 proximal 0.93, distal 0.80 3. LCX: FFRct 0.95 proximal, 0.85 mid, 0.67 distal. 4. RCA: No significant stenosis. FFRct 0.99 proximal, 0.94 distal.  IMPRESSION: 1. CT FFR analysis showed significant disease in the distal LCX. This is a small vessel not amenable to stenting. Recommend medical management.  2. No obstructive CAD in the LAD or RCA territories.  3. Recommendation aggressive medical management with aspirin and high potency statin.  Laboratory Data:  Chemistry Recent Labs  Lab 01/30/19 1848 01/31/19 0419 02/01/19 0407  NA 134* 136 132*  K 3.1* 4.2 3.7  CL 100 102 99  CO2 21* 25 25   GLUCOSE 112* 99 124*  BUN 22 19 16   CREATININE 0.58 0.64 0.56  CALCIUM 9.0 9.0 8.5*  GFRNONAA >60 >60 >60  GFRAA >60 >60 >60  ANIONGAP 13 9 8     Recent Labs  Lab 01/30/19 1848  PROT 5.9*  ALBUMIN 2.6*  AST 20  ALT 10  ALKPHOS 70  BILITOT 0.5   Hematology Recent Labs  Lab 01/30/19 1848 01/31/19 0419 02/01/19 0407  WBC 8.6 8.0 7.8  RBC 3.92 3.59* 3.75*  HGB 12.5 11.5* 11.9*  HCT 37.9 34.2* 35.5*  MCV 96.7 95.3 94.7  MCH 31.9 32.0 31.7  MCHC 33.0 33.6 33.5  RDW 13.7 13.7 13.8  PLT 149* 158 153   Cardiac Enzymes Recent Labs  Lab 01/30/19 1848 01/30/19 2237 01/31/19 0419 01/31/19 1029  TROPONINI 0.03* 0.03* 0.03* 0.04*   No results for input(s): TROPIPOC in the last 168 hours.  BNP Recent Labs  Lab 01/30/19 1849  BNP 271.4*    Radiology/Studies:  Dg Pelvis Portable  Result Date: 01/30/2019 CLINICAL DATA:  Weakness and decreased mobility. EXAM: PORTABLE PELVIS 1-2 VIEWS COMPARISON:  11/19/2012 radiographs FINDINGS: There is no evidence of pelvic fracture or diastasis. No pelvic bone lesions are seen. IMPRESSION: Negative. Electronically Signed   By: Margarette Canada M.D.   On: 01/30/2019 19:34   Dg Chest Portable 1 View  Result Date: 01/30/2019 CLINICAL DATA:  Weakness EXAM: PORTABLE CHEST 1 VIEW COMPARISON:  07/22/2018 and prior radiographs FINDINGS: The cardiomediastinal silhouette is unchanged with stable SUPERIOR mediastinal fullness. There is no evidence of focal airspace disease, pulmonary edema, suspicious pulmonary nodule/mass, pleural  effusion, or pneumothorax. No acute bony abnormalities are identified. RIGHT shoulder arthroplasty changes again noted. IMPRESSION: No acute abnormality. Electronically Signed   By: Margarette Canada M.D.   On: 01/30/2019 19:33    Assessment and Plan:   1. Dyspnea on exertion - Difficult historian however reports shortness of breath with activity without chest pain and weakness for past few months. -Not overtly volume overloaded  by exam.  Chest x-ray without acute cardiopulmonary disease.  BNP 270. -Her symptoms could be anginal equivalent given nonspecific T wave changes during admission which improved.  She does have a history of distal left circumflex disease.  This treated medically.   -Differential includes arrhythmia given intermittent palpitation and sinus tachycardia  However EKG read as atrial fibrillation during admission December 2019.  - Question remains ifs he is a good candidate for anticoagulation or not given history of subdural hematoma and fall if found to have arrhythmia. -Echocardiogram December 2019 showed severe tricuspid regurgitation however unable to visualize by echocardiogram yesterday. -She seems poor candidate for invasive evaluation.  2.  CAD - CT FFR analysis showed significant disease in the distal LCX. This is a small vessel not amenable to stenting.  - Medically managed.  3. Seizure / Bipolar disorder - Per primary   4.  Deconditioning -Pending PT/OT  Dr. Marlou Porch to see.   For questions or updates, please contact [place] Please consult [email or web address] for contact info under     Jarrett Soho, PA  02/01/2019 10:16 AM   Personally seen and examined. Agree with above.   85 year old with medically managed CAD from recent CT of coronary arteries being seen for shortness of breath.  Currently in the bed, appears comfortable, normal work of breathing, asking for assistance to get up.  Gilford Rile is in the corner of the room.  GEN: Elderly in no acute distress  HEENT: normal  Neck: no JVD, carotid bruits, or masses Cardiac: RRR; no murmurs, rubs, or gallops,no edema  Respiratory:  clear to auscultation bilaterally, normal work of breathing GI: soft, nontender, nondistended, + BS MS: no deformity or atrophy  Skin: warm and dry, no rash Neuro:  Alert and Oriented x 3, moves all extremities Psych: euthymic mood, full affect  Troponin minimally elevated and flat.  Echocardiogram personally reviewed shows normal ejection fraction.  Interestingly, upon second review, I still do not see any evidence of significant tricuspid regurgitation as was noted on prior echocardiogram.  Dyspnea on exertion/deconditioning - Likely secondary to conditioning.  Continue to work with therapy.  Coronary artery disease - Evaluated previously with coronary CT.  Medical management.  No changes made.  We will sign off.  Please let us know if we can be of further assistance.  Candee Furbish, MD

## 2019-02-01 NOTE — Plan of Care (Signed)
Care plan reviewed: Pt is progressing.   Problem: Clinical Measurements: Goal: Respiratory complications will improve Outcome: Progressing   Problem: Pain Managment: Goal: General experience of comfort will improve Outcome: Progressing   Problem: Safety: Goal: Ability to remain free from injury will improve Outcome: Progressing  Problem: Elimination: Goal: Will not experience complications related to bowel motility Outcome: Progressing  Problem: Elimination: Goal: Will not experience complications related to urinary retention Outcome: Progressing    Problem: Activity: Goal: Risk for activity intolerance will decrease Outcome: Progressing    Rondel Oh , RN

## 2019-02-01 NOTE — Progress Notes (Addendum)
Family Medicine Teaching Service Daily Progress Note Intern Pager: [phone]  Patient name: [name] Medical record number: [id number] Date of birth: 04/30/1931 Age: 85 y.o. Gender: female  Primary Care Provider: [name], MD Consultants: Cardiology Code Status: DNR  Pt Overview and Major Events to Date:  6/20 Admitted to [facility] 6/22 Care transferred to [facility] and Plan: [name] is a 85 y.o. F with CAD no prior PCI, HTN, hx meningioma/craniotomy, hx seizures, hx SDH with right parietal encephalomalacia, asthma, anemia, bipolar disorder, and GI bleed who presented with generalized weakness and dyspnea with exertion, progressive for few months.  Exertional intolerance, possible anginal equivalent  H/o CAD  Elevated Troponin Trop 0.03>0.03>0.03>0.04 EKG: NSR without ST changes Echo: EF >65%, impaired LV relaxation, moderate mitral annular calcification and moderate aortic valve calcification. Have noted overall decline since Dec 2019 after fall and new-onset seizure.  PT recommends SNF. Patient amenable to going to SNF.  - cardiology consulted and recommend continued medical management - Continue home ASA and Atorvastatin - Continue home Imdur - Continue Metoprolol - PT: SNF, OT to see  Acute on Chronic diastolic CHF: I/O's: .6L total Wts ?11 kg down from admission. Unsure if admit weight acurate.  - discontinue diuresis - appreciate cardiology recommendations  HTN: BP this AM 173/62. Home meds: Imdur and HCTZ. Started on Metoprolol on 6/21 by [facility]. - cont imdur - cont metoprolol 12.5mg  BID  Seizures: Valproic acid level 51, WNL. Home meds: Depakote 500mg : 1 PO in AM, 2 PO in PM.  First seziure Dec 2019 shortly after fall when she hit her head.  Notes that she has had memory loss, weakness, and "Feeling drugged" since then.  Seroquel and Depakote likely contributing.  Has been held since admission per [facility]. - restart Depakote at  home dose - recommend outpatient follow up with neurology  Bipolar Disorder: - decrease fluoxetine to 20 mg, Seroquel 12.5 mg - keep Valproic acid as above  Hypokalemia: resolved K 3.7 this AM.  Anemia: Hgb stable at 11.5>11.9 this AM. History of GI bleed. NO signs of bleeding on exam - continue to monitor  FEN/GI: Heart Healthy PPx: Lovenox  Disposition: Stable for SNF, unsafe dispo for home.   Subjective:  Complaining of throat pain. Denies any CP. Given tylenol and benzocaine for it. No other complaints. Patient says that she would go to a SNF if she could afford it.   Objective: Temp:  [97.9 F (36.6 C)-98.7 F (37.1 C)] 97.9 F (36.6 C) (06/22 0414) Pulse Rate:  [59-85] 59 (06/22 0416) Resp:  [16-22] 20 (06/22 0416) BP: (126-176)/(59-71) 173/62 (06/22 0417) SpO2:  [94 %-97 %] 96 % (06/22 0416) Weight:  [78.1 kg] 78.1 kg (06/22 0426)  Physical Exam:  General: 85 y.o. female in NAD, frail appearing Cardio: RRR no m/r/g Lungs: CTAB, NWOB Abdomen: Soft, non-tender to palpation, non-distended, positive bowel sounds Skin: warm and dry Extremities: No edema  Laboratory: Recent Labs  Lab 01/30/19 1848 01/31/19 0419 02/01/19 0407  WBC 8.6 8.0 7.8  HGB 12.5 11.5* 11.9*  HCT 37.9 34.2* 35.5*  PLT 149* 158 153   Recent Labs  Lab 01/30/19 1848 01/31/19 0419 02/01/19 0407  NA 134* 136 132*  K 3.1* 4.2 3.7  CL 100 102 99  CO2 21* 25 25  BUN 22 19 16   CREATININE 0.58 0.64 0.56  CALCIUM 9.0 9.0 8.5*  PROT 5.9*  --   --   BILITOT 0.5  --   --  ALKPHOS 70  --   --   ALT 10  --   --   AST 20  --   --   GLUCOSE 112* 99 124*   ECHO 01/31/2019 IMPRESSIONS  1. The left ventricle has hyperdynamic systolic function, with an ejection fraction of >65%. The cavity size was normal. Left ventricular diastolic Doppler parameters are consistent with impaired relaxation.  2. The right ventricle has normal systolic function. The cavity was normal. There is no increase in  right ventricular wall thickness.  3. There is moderate mitral annular calcification present.  4. The aortic valve is tricuspid. Moderate thickening of the aortic valve. Moderate calcification of the aortic valve. Aortic valve regurgitation was not assessed by color flow Doppler.  Imaging/Diagnostic Tests: Dg Pelvis Portable  Result Date: 01/30/2019 CLINICAL DATA:  Weakness and decreased mobility. EXAM: PORTABLE PELVIS 1-2 VIEWS COMPARISON:  11/19/2012 radiographs FINDINGS: There is no evidence of pelvic fracture or diastasis. No pelvic bone lesions are seen. IMPRESSION: Negative. Electronically Signed   By: Margarette Canada M.D.   On: 01/30/2019 19:34   Dg Chest Portable 1 View  Result Date: 01/30/2019 CLINICAL DATA:  Weakness EXAM: PORTABLE CHEST 1 VIEW COMPARISON:  07/22/2018 and prior radiographs FINDINGS: The cardiomediastinal silhouette is unchanged with stable SUPERIOR mediastinal fullness. There is no evidence of focal airspace disease, pulmonary edema, suspicious pulmonary nodule/mass, pleural effusion, or pneumothorax. No acute bony abnormalities are identified. RIGHT shoulder arthroplasty changes again noted. IMPRESSION: No acute abnormality. Electronically Signed   By: Margarette Canada M.D.   On: 01/30/2019 19:33   Meccariello, Bernita Raisin, DO 02/01/2019, 6:51 AM PGY-2, [place] Intern pager: [phone], text pages welcome

## 2019-02-02 ENCOUNTER — Encounter (HOSPITAL_COMMUNITY): Payer: Self-pay | Admitting: Family Medicine

## 2019-02-02 ENCOUNTER — Telehealth: Payer: Self-pay | Admitting: *Deleted

## 2019-02-02 DIAGNOSIS — E871 Hypo-osmolality and hyponatremia: Secondary | ICD-10-CM | POA: Diagnosis present

## 2019-02-02 DIAGNOSIS — Z20828 Contact with and (suspected) exposure to other viral communicable diseases: Secondary | ICD-10-CM | POA: Diagnosis present

## 2019-02-02 DIAGNOSIS — R531 Weakness: Secondary | ICD-10-CM | POA: Diagnosis present

## 2019-02-02 DIAGNOSIS — I5033 Acute on chronic diastolic (congestive) heart failure: Secondary | ICD-10-CM | POA: Diagnosis present

## 2019-02-02 DIAGNOSIS — Z7189 Other specified counseling: Secondary | ICD-10-CM | POA: Diagnosis not present

## 2019-02-02 DIAGNOSIS — Z86011 Personal history of benign neoplasm of the brain: Secondary | ICD-10-CM | POA: Diagnosis not present

## 2019-02-02 DIAGNOSIS — D649 Anemia, unspecified: Secondary | ICD-10-CM | POA: Diagnosis present

## 2019-02-02 DIAGNOSIS — R0602 Shortness of breath: Secondary | ICD-10-CM | POA: Diagnosis not present

## 2019-02-02 DIAGNOSIS — E876 Hypokalemia: Secondary | ICD-10-CM | POA: Diagnosis present

## 2019-02-02 DIAGNOSIS — E785 Hyperlipidemia, unspecified: Secondary | ICD-10-CM | POA: Diagnosis present

## 2019-02-02 DIAGNOSIS — Y92239 Unspecified place in hospital as the place of occurrence of the external cause: Secondary | ICD-10-CM | POA: Diagnosis not present

## 2019-02-02 DIAGNOSIS — Z96611 Presence of right artificial shoulder joint: Secondary | ICD-10-CM | POA: Diagnosis present

## 2019-02-02 DIAGNOSIS — E860 Dehydration: Secondary | ICD-10-CM | POA: Diagnosis present

## 2019-02-02 DIAGNOSIS — F319 Bipolar disorder, unspecified: Secondary | ICD-10-CM | POA: Diagnosis present

## 2019-02-02 DIAGNOSIS — R1310 Dysphagia, unspecified: Secondary | ICD-10-CM | POA: Diagnosis present

## 2019-02-02 DIAGNOSIS — R29898 Other symptoms and signs involving the musculoskeletal system: Secondary | ICD-10-CM | POA: Diagnosis not present

## 2019-02-02 DIAGNOSIS — J45909 Unspecified asthma, uncomplicated: Secondary | ICD-10-CM | POA: Diagnosis present

## 2019-02-02 DIAGNOSIS — Z888 Allergy status to other drugs, medicaments and biological substances status: Secondary | ICD-10-CM | POA: Diagnosis not present

## 2019-02-02 DIAGNOSIS — Z9842 Cataract extraction status, left eye: Secondary | ICD-10-CM | POA: Diagnosis not present

## 2019-02-02 DIAGNOSIS — Z8249 Family history of ischemic heart disease and other diseases of the circulatory system: Secondary | ICD-10-CM | POA: Diagnosis not present

## 2019-02-02 DIAGNOSIS — Z9841 Cataract extraction status, right eye: Secondary | ICD-10-CM | POA: Diagnosis not present

## 2019-02-02 DIAGNOSIS — Z90710 Acquired absence of both cervix and uterus: Secondary | ICD-10-CM | POA: Diagnosis not present

## 2019-02-02 DIAGNOSIS — Z820 Family history of epilepsy and other diseases of the nervous system: Secondary | ICD-10-CM | POA: Diagnosis not present

## 2019-02-02 DIAGNOSIS — R627 Adult failure to thrive: Secondary | ICD-10-CM | POA: Diagnosis not present

## 2019-02-02 DIAGNOSIS — Z96653 Presence of artificial knee joint, bilateral: Secondary | ICD-10-CM | POA: Diagnosis present

## 2019-02-02 DIAGNOSIS — I110 Hypertensive heart disease with heart failure: Secondary | ICD-10-CM | POA: Diagnosis present

## 2019-02-02 DIAGNOSIS — R569 Unspecified convulsions: Secondary | ICD-10-CM | POA: Diagnosis present

## 2019-02-02 DIAGNOSIS — M1990 Unspecified osteoarthritis, unspecified site: Secondary | ICD-10-CM | POA: Diagnosis present

## 2019-02-02 DIAGNOSIS — Z880 Allergy status to penicillin: Secondary | ICD-10-CM | POA: Diagnosis not present

## 2019-02-02 DIAGNOSIS — Z609 Problem related to social environment, unspecified: Secondary | ICD-10-CM | POA: Diagnosis not present

## 2019-02-02 DIAGNOSIS — R7989 Other specified abnormal findings of blood chemistry: Secondary | ICD-10-CM | POA: Diagnosis present

## 2019-02-02 DIAGNOSIS — Z515 Encounter for palliative care: Secondary | ICD-10-CM | POA: Diagnosis not present

## 2019-02-02 HISTORY — DX: Shortness of breath: R06.02

## 2019-02-02 MED ORDER — MUSCLE RUB 10-15 % EX CREA
1.0000 "application " | TOPICAL_CREAM | CUTANEOUS | Status: DC | PRN
  Administered 2019-02-02 – 2019-02-04 (×3): 1 via TOPICAL
  Filled 2019-02-02: qty 85

## 2019-02-02 NOTE — TOC Progression Note (Signed)
Transition of Care [facility]) - Progression Note    Patient Details  Name: [name] MRN: [id number] Date of Birth: [birth year]  Transition of Care [facility]) CM/SW [place], [place] Phone Number: 02/02/2019, 2:16 PM  Clinical Narrative:    CSW visited with the patient at bedside. Patient answered CSW questions appropriately. CSW was consulted for SNF placement. Patient appeared to understand the need of short term rehab before returning home. Patient states both of her daughters, [name] and [name] reside in [place]. Patient states she lives at home alone. Patient has someone to come to the home from [facility] daily 3-4 per day and 2 hrs on the weekend.   06/22-, CSW spoke with patient's daughter, [name]. CSW advised of PT  recommendation for SNF place. Patient's daughter indicated she did not want the patient going to SNF, and she requested CSW speak with her sister, [name]. Patient's daughter states she and her sister do not speak much and has a very dysfunctional relationship with their mother (the patient). CSW advised they talk and agree on the care plan for the patient or determine who was going to make the decisions.   6/23- CSW talked with [name] and she and her sister, [name], have agreed it is best for the patient to go to rehab. They have no preference at this time. CSW informed them of the Medicare.gov website to review for choice. CSW faxed out referrals for placement. Family had no other questions or concerns at this time.     Thurmond Butts, MSW, [facility] Clinical Social Worker [phone]       Expected Discharge Plan: Skilled Nursing Facility Barriers to Discharge: Continued Medical Work up  Expected Discharge Plan and Services Expected Discharge Plan: [place] arrangements for the past 2 months: Single Family Home                                       Social Determinants of Health (SDOH) Interventions    Readmission  Risk Interventions No flowsheet data found.

## 2019-02-02 NOTE — NC FL2 (Signed)
[place] LEVEL OF CARE SCREENING TOOL     IDENTIFICATION  Patient Name: [name] Birthdate: 02-Apr-1931 Sex: female Admission Date (Current Location): 01/30/2019  [facility] and [place] Number:  Herbalist and Address:         Provider Number: [id number]  Attending Physician Name and Address:  Kinnie Feil, MD  Relative Name and Phone Number:  [name]  [phone]     [name] [phone]    Current Level of Care: Hospital Recommended Level of Care: [place] Prior Approval Number:    Date Approved/Denied:   PASRR Number: Pending  Discharge Plan: SNF    Current Diagnoses: Patient Active Problem List   Diagnosis Date Noted  . Shortness of breath 02/02/2019  . SOB (shortness of breath) 01/30/2019  . Generalized weakness 01/30/2019  . Thrombocytopenia ([place]) 01/30/2019  . Asthma 01/30/2019  . Encounter for palliative care 09/30/2018  . High risk social situation 07/30/2018  . 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 Frequent falls 12/25/2017  . Weight loss, non-intentional 08/14/2017  . S/P shoulder replacement 03/29/2016  . Diverticulitis of colon without hemorrhage 12/04/2015  . Rotator cuff syndrome of left shoulder 10/26/2015  . Cervical disc disorder with radiculopathy of cervical region 10/26/2015  . Heart murmur, systolic 37/16/9678  . Rosacea 09/07/2015  . Adenomatous colon polyp 04/12/2014  . Hearing loss in left ear 04/13/2013  . Coronary artery disease 03/25/2012  . Hypokalemia 03/25/2012  . Osteoporosis 11/13/2011  . Pure hypercholesterolemia 10/09/2006  . Major depression, recurrent, chronic ([place]) 10/09/2006  . Hereditary and idiopathic peripheral neuropathy 10/09/2006  . HYPERTENSION, BENIGN SYSTEMIC 10/09/2006  . Osteoarthritis, multiple sites 10/09/2006  . Seizures ([place]) 10/09/2006    Orientation RESPIRATION BLADDER Height & Weight     Self, Place  Normal External catheter, Incontinent  Weight: 172 lb 2.9 oz (78.1 kg) Height:  5\' 6"  (167.6 cm)  BEHAVIORAL SYMPTOMS/MOOD NEUROLOGICAL BOWEL NUTRITION STATUS      Continent Diet(Please see discharge summary)  AMBULATORY STATUS COMMUNICATION OF NEEDS Skin       Normal                       Personal Care Assistance Level of Assistance              Functional Limitations Info  Sight, Hearing, Speech Sight Info: Adequate Hearing Info: Adequate Speech Info: Adequate    SPECIAL CARE FACTORS FREQUENCY  PT (By licensed PT), OT (By licensed OT)     PT Frequency: 3x per week OT Frequency: 3x per week            Contractures Contractures Info: Not present    Additional Factors Info  Code Status, Allergies Code Status Info: DNR Allergies Info: Amoxicillin,Penicillins,Keppra,Phenobarbital,Phenytoin,Tape           Current Medications (02/02/2019):  This is the current hospital active medication list Current Facility-Administered Medications  Medication Dose Route Frequency Provider Last Rate Last Dose  . acetaminophen (TYLENOL) tablet 650 mg  650 mg Oral Q4H PRN Shela Leff, MD   650 mg at 02/01/19 9381  . albuterol (PROVENTIL) (2.5 MG/3ML) 0.083% nebulizer solution 2.5 mg  2.5 mg Inhalation Q4H PRN Donne Hazel, MD      . aspirin EC tablet 81 mg  81 mg Oral Daily Edwin Dada, MD   81 mg at 02/02/19 0958  . atorvastatin (LIPITOR) tablet 40 mg  40 mg Oral q1800 Danford, Christopher P,  MD   40 mg at 02/01/19 1742  . Benzocaine (HURRCAINE) 20 % mouth spray   Mouth/Throat QID PRN Bonnita Hollow, MD      . divalproex (DEPAKOTE) DR tablet 500 mg  500 mg Oral Daily Hammons, Kimberly B, RPH   500 mg at 02/02/19 1448   And  . divalproex (DEPAKOTE) DR tablet 1,000 mg  1,000 mg Oral QHS Hammons, Kimberly B, RPH   1,000 mg at 02/01/19 2301  . enoxaparin (LOVENOX) injection 40 mg  40 mg Subcutaneous Daily Shela Leff, MD   40 mg at 02/02/19 0958  . FLUoxetine (PROZAC) capsule 20 mg  20 mg  Oral Daily Josephine Igo B, MD   20 mg at 02/01/19 1517  . hydrALAZINE (APRESOLINE) injection 5 mg  5 mg Intravenous Q4H PRN Shela Leff, MD   5 mg at 02/01/19 0420  . isosorbide mononitrate (IMDUR) 24 hr tablet 15 mg  15 mg Oral Daily Danford, Suann Larry, MD   15 mg at 02/01/19 1517  . metoprolol tartrate (LOPRESSOR) tablet 12.5 mg  12.5 mg Oral BID Edwin Dada, MD   12.5 mg at 02/02/19 0958  . Muscle Rub CREA 1 application  1 application Topical PRN Meccariello, Bernita Raisin, DO   1 application at 18/56/31 1305  . ondansetron (ZOFRAN) injection 4 mg  4 mg Intravenous Q6H PRN Shela Leff, MD      . QUEtiapine (SEROQUEL) tablet 12.5 mg  12.5 mg Oral QHS Matilde Haymaker, MD   12.5 mg at 02/01/19 2301     Discharge Medications: Please see discharge summary for a list of discharge medications.  Relevant Imaging Results:  Relevant Lab Results:   Additional Information SSN [place], [place]

## 2019-02-02 NOTE — Plan of Care (Signed)
  Problem: Clinical Measurements: Goal: Will remain free from infection Outcome: Progressing Goal: Respiratory complications will improve Outcome: Progressing Goal: Cardiovascular complication will be avoided Outcome: Progressing   

## 2019-02-02 NOTE — Telephone Encounter (Signed)
Pt request a call to discuss pt status and where to go from here. Christen Bame, CMA

## 2019-02-02 NOTE — Discharge Summary (Signed)
[facility] Discharge Summary  Patient name: [name] Medical record number: [id number] Date of birth: Jul 19, 1931 Age: 85 y.o. Gender: female Date of Admission: 01/30/2019  Date of Discharge: 02/04/2019 Admitting Physician: Shela Leff, MD  Primary Care Provider: [name], MD Consultants: Palliative, Cardiology   Indication for Hospitalization: weakness and DOE   Discharge Diagnoses/Problem List:  Exertional intolerance,possibleanginal equivalent  H/o CAD  Elevated Troponin Acute on Chronic diastolic CHF: HTN Odynophagia Mild hyponatremia Hypokalemia Luekocytosis Seizures  Bipolar disorder  Anemia  Disposition: SNF  Discharge Condition: improving  Discharge Exam:  Performed by Dr. Sandi Carne on date of discharge  General: 85 y.o. female in NAD Cardio: RRR  Lungs: CTAB, no wheezing, no rhonchi, no crackles, no IWOB on RA Abdomen: Soft, non-tender to palpation, non-distended, positive bowel sounds Skin: warm and dry Extremities: Trace BLE pitting edema  Brief Hospital Course:  [name] a 85 y.o.Fwith CAD no prior PCI, HTN, hx meningioma/craniotomy, hx seizures, hx SDH with right parietal encephalomalacia, asthma, anemia, bipolar disorder, and GI bleed who presented with generalized weakness and dyspnea with exertion, progressive for few months.  Her hospital course is outlined below.  [name] was initially admitted for exercise intolerance and work-up with possible ACS.  Troponins were minimally elevated during her hospitalization to 0.03, and remained flat.  Cardiology was consulted and noted that exercise intolerance is likely secondary to deconditioning.  Patient was already on medical management needed.  PT and OT saw patient, and recommended SNF.  Patient and her daughter agreed and she was discharged to SNF facility on 6/25.  Patient was initially admitted to [facility] hospitalist service, but then  transferred to family medicine service the day after admission.  [facility] had started patient on metoprolol on 6/21 at low-dose 12.5 mg twice daily.  Hydrochlorothiazide was discontinued.  Given patient's overall decline over the last few years, and discussion with her PCP, Dr. Andria Frames, it was recommended that patient have a palliative consult to discuss possible hospice care. Palliative recommended transition to palliative with outpatient hospice follow up.   At the time of discharge, patient was with stable vital signs and denied new complaints.  Issues for Follow Up:  1. Recommend hospice to follow at nursing home.  2. HCTZ discontinued   Significant Procedures: None  Significant Labs and Imaging:  Recent Labs  Lab 02/01/19 0407 02/03/19 0453 02/04/19 0346  WBC 7.8 11.9* 10.3  HGB 11.9* 11.8* 11.5*  HCT 35.5* 34.5* 33.3*  PLT 153 189 208   Recent Labs  Lab 01/30/19 1848 01/31/19 0419 02/01/19 0407 02/03/19 0453 02/04/19 0346  NA 134* 136 132* 132* 131*  K 3.1* 4.2 3.7 3.7 3.4*  CL 100 102 99 99 100  CO2 21* 25 25 23 23   GLUCOSE 112* 99 124* 95 90  BUN 22 19 16 21 23   CREATININE 0.58 0.64 0.56 0.62 0.56  CALCIUM 9.0 9.0 8.5* 8.6* 8.3*  MG 2.0  --   --   --   --   ALKPHOS 70  --   --   --   --   AST 20  --   --   --   --   ALT 10  --   --   --   --   ALBUMIN 2.6*  --   --   --   --     Dg Pelvis Portable  Result Date: 01/30/2019 CLINICAL DATA:  Weakness and decreased mobility. EXAM: PORTABLE PELVIS 1-2  VIEWS COMPARISON:  11/19/2012 radiographs FINDINGS: There is no evidence of pelvic fracture or diastasis. No pelvic bone lesions are seen. IMPRESSION: Negative. Electronically Signed   By: Margarette Canada M.D.   On: 01/30/2019 19:34   Dg Chest Portable 1 View  Result Date: 01/30/2019 CLINICAL DATA:  Weakness EXAM: PORTABLE CHEST 1 VIEW COMPARISON:  07/22/2018 and prior radiographs FINDINGS: The cardiomediastinal silhouette is unchanged with stable SUPERIOR mediastinal  fullness. There is no evidence of focal airspace disease, pulmonary edema, suspicious pulmonary nodule/mass, pleural effusion, or pneumothorax. No acute bony abnormalities are identified. RIGHT shoulder arthroplasty changes again noted. IMPRESSION: No acute abnormality. Electronically Signed   By: Margarette Canada M.D.   On: 01/30/2019 19:33     Results/Tests Pending at Time of Discharge:  Unresulted Labs (From admission, onward)    Start     Ordered   02/05/19 8938  Basic metabolic panel  Tomorrow morning,   R    Question:  Specimen collection method  Answer:  Lab=Lab collect   02/04/19 1122   01/30/19 2134  Novel Coronavirus,NAA,(SEND-OUT TO REF LAB - TAT 24-48 hrs); Hosp Order  (Symptomatic Patients Labs with Precautions )  Once,   STAT    Question Answer Comment  Current symptoms Fever and Shortness of breath   Excluded other viral illnesses No (testing not indicated)      01/30/19 2133           Discharge Medications:  Allergies as of 02/04/2019      Reactions   Amoxicillin Anaphylaxis   Penicillins Anaphylaxis, Swelling   From childhood: Has patient had a PCN reaction causing immediate rash, facial/tongue/throat swelling, SOB or lightheadedness with hypotension: Yes Has patient had a PCN reaction causing severe rash involving mucus membranes or skin necrosis: Unk Has patient had a PCN reaction that required hospitalization: No Has patient had a PCN reaction occurring within the last 10 years: No If all of the above answers are "NO", then may proceed with Cephalosporin use. Marland Kitchen   Keppra [levetiracetam]    Attributes worsening anxiety to keppra.   Phenobarbital Other (See Comments)   Reaction not recalled by the patient   Phenytoin Other (See Comments)   "Makes me feel funny"    Tape Rash   Please use paper tape      Medication List    STOP taking these medications   hydrochlorothiazide 12.5 MG capsule Commonly known as: MICROZIDE     TAKE these medications   aspirin  81 MG EC tablet Take 1 tablet (81 mg total) by mouth daily.   atorvastatin 40 MG tablet Commonly known as: LIPITOR Take 40 mg by mouth daily.   calcium-vitamin D 500-200 MG-UNIT tablet Commonly known as: OSCAL WITH D Take 1 tablet by mouth daily with breakfast.   CoQ10 200 MG Caps Take 200 mg by mouth daily.   divalproex 500 MG DR tablet Commonly known as: DEPAKOTE Take 1 tablet in the morning, take 2 tablets in the evening   FLUoxetine 20 MG capsule Commonly known as: PROZAC Take 1 capsule (20 mg total) by mouth daily. Start taking on: February 05, 2019 What changed:   medication strength  how much to take   ibuprofen 200 MG tablet Commonly known as: ADVIL Take 400 mg by mouth at bedtime.   isosorbide mononitrate 30 MG 24 hr tablet Commonly known as: IMDUR Take 0.5 tablets (15 mg total) by mouth daily.   magnesium 30 MG tablet Take 30 mg by mouth daily.  metoprolol tartrate 25 MG tablet Commonly known as: LOPRESSOR Take 0.5 tablets (12.5 mg total) by mouth 2 (two) times daily.   QUEtiapine 25 MG tablet Commonly known as: SEROquel Take 0.5 tablets (12.5 mg total) by mouth at bedtime as needed (anxiety). What changed:   how much to take  when to take this  reasons to take this       Discharge Instructions: Please refer to Patient Instructions section of EMR for full details.  Patient was counseled important signs and symptoms that should prompt return to medical care, changes in medications, dietary instructions, activity restrictions, and follow up appointments.   Follow-Up Appointments:   Caroline More, DO 02/04/2019, 5:13 PM PGY-2, [place]

## 2019-02-02 NOTE — Progress Notes (Signed)
Patient swallowing pills whole with apple sauce. Sitting upright. Clatie Kessen, Bettina Gavia RN

## 2019-02-02 NOTE — Progress Notes (Addendum)
Patient daughter [name] updated on patient. All questions answered. Will monitor patient. Kiely Cousar, Bettina Gavia rN

## 2019-02-02 NOTE — Progress Notes (Signed)
Spoke with patient daughter [name], all questions answered. Will monitor patient. [name], Bettina Gavia RN

## 2019-02-02 NOTE — Progress Notes (Addendum)
Family Medicine Teaching Service Daily Progress Note Intern Pager: [phone]  Patient name: [name] Medical record number: [id number] Date of birth: 1931-01-20 Age: 85 y.o. Gender: female  Primary Care Provider: [name], MD Consultants: Cardiology Code Status: DNR  Pt Overview and Major Events to Date:  6/20 Admitted to [facility] 6/22 Care transferred to [place] and Plan: [name] is a 85 y.o. F with CAD no prior PCI, HTN, hx meningioma/craniotomy, hx seizures, hx SDH with right parietal encephalomalacia, asthma, anemia, bipolar disorder, and GI bleed who presented with generalized weakness and dyspnea with exertion, progressive for few months. She is medically stable for discharge to SNF.  Exertional intolerance, possible anginal equivalent  H/o CAD  Elevated Troponin Troponin minimally elevated and flat while inpatient.  Echo with normal ejection fraction.  Cardiology consulted on 6/22 and noted dyspnea on exertion/deconditioning is likely secondary to deconditioning and patient should continue to work with PT.  Also evaluated patient's previous coronary CT, noted medical management and no changes made.  Cardiology signed off. - cardiology consulted and recommend continued medical management - Continue home ASA and Atorvastatin - Continue home Imdur - Continue Metoprolol -PT/OT recommend SNF, patient amenable -Follow-up social work for SNF placement -COVID pending  Acute on Chronic diastolic CHF: Echo with preserved ejection fraction.  UOP -1.5L in last 24 hrs. - cardiology signed off  HTN: BP this AM 146/77. Home meds: Imdur and HCTZ. Started on Metoprolol on 6/21 by [facility]. - cont imdur - cont metoprolol 12.5mg  BID  Odynophagia Patient notes chronic odynophagia.   - will consult SLP while inpatient  Mild Hyponatremia Na 136>132 this AM.   - cont to monitor  Seizures: Valproic acid level 51, WNL. Home meds: Depakote 500mg :  1 PO in AM, 2 PO in PM.  First seziure Dec 2019 shortly after fall when she hit her head.  Notes that she has had memory loss, weakness, and "Feeling drugged" since then.  Seroquel and Depakote likely contributing.  Has been held since admission per [facility]. -Continue Depakote at home dose - recommend outpatient follow up with neurology  Bipolar Disorder: - decrease fluoxetine to 20 mg, Seroquel 12.5 mg - keep Valproic acid as above  Anemia: Hgb stable at 11.9. History of GI bleed. NO signs of bleeding on exam - continue to monitor  FEN/GI: Heart Healthy PPx: Lovenox  Disposition: Medically stable for discharge to SNF  Subjective:  Patient notes some mild chronic neck pain this AM.  Otherwise denies complaints.  Objective: Temp:  [98 F (36.7 C)-99 F (37.2 C)] 98.3 F (36.8 C) (06/23 0533) Pulse Rate:  [58-69] 64 (06/23 0533) Resp:  [14-20] 20 (06/23 0533) BP: (127-167)/(54-77) 146/77 (06/23 0533) SpO2:  [93 %-97 %] 97 % (06/23 0533)  Physical Exam:  General: 85 y.o. female in NAD Neck: No TTP left clavicle or neck musculature, good ROM Cardio: RRR no m/r/g Lungs: CTAB, no wheezing, no rhonchi, no crackles, no IWOB on RA Abdomen: Soft, non-tender to palpation, non-distended, positive bowel sounds Skin: warm and dry Extremities: Trace BLE edema  Laboratory: Recent Labs  Lab 01/30/19 1848 01/31/19 0419 02/01/19 0407  WBC 8.6 8.0 7.8  HGB 12.5 11.5* 11.9*  HCT 37.9 34.2* 35.5*  PLT 149* 158 153   Recent Labs  Lab 01/30/19 1848 01/31/19 0419 02/01/19 0407  NA 134* 136 132*  K 3.1* 4.2 3.7  CL 100 102 99  CO2 21* 25 25  BUN 22 19 16  CREATININE 0.58 0.64 0.56  CALCIUM 9.0 9.0 8.5*  PROT 5.9*  --   --   BILITOT 0.5  --   --   ALKPHOS 70  --   --   ALT 10  --   --   AST 20  --   --   GLUCOSE 112* 99 124*   ECHO 01/31/2019 IMPRESSIONS  1. The left ventricle has hyperdynamic systolic function, with an ejection fraction of >65%. The cavity size was normal.  Left ventricular diastolic Doppler parameters are consistent with impaired relaxation.  2. The right ventricle has normal systolic function. The cavity was normal. There is no increase in right ventricular wall thickness.  3. There is moderate mitral annular calcification present.  4. The aortic valve is tricuspid. Moderate thickening of the aortic valve. Moderate calcification of the aortic valve. Aortic valve regurgitation was not assessed by color flow Doppler.  Imaging/Diagnostic Tests: Dg Pelvis Portable  Result Date: 01/30/2019 CLINICAL DATA:  Weakness and decreased mobility. EXAM: PORTABLE PELVIS 1-2 VIEWS COMPARISON:  11/19/2012 radiographs FINDINGS: There is no evidence of pelvic fracture or diastasis. No pelvic bone lesions are seen. IMPRESSION: Negative. Electronically Signed   By: Margarette Canada M.D.   On: 01/30/2019 19:34   Dg Chest Portable 1 View  Result Date: 01/30/2019 CLINICAL DATA:  Weakness EXAM: PORTABLE CHEST 1 VIEW COMPARISON:  07/22/2018 and prior radiographs FINDINGS: The cardiomediastinal silhouette is unchanged with stable SUPERIOR mediastinal fullness. There is no evidence of focal airspace disease, pulmonary edema, suspicious pulmonary nodule/mass, pleural effusion, or pneumothorax. No acute bony abnormalities are identified. RIGHT shoulder arthroplasty changes again noted. IMPRESSION: No acute abnormality. Electronically Signed   By: Margarette Canada M.D.   On: 01/30/2019 19:33   Marget Outten, Bernita Raisin, DO 02/02/2019, 7:27 AM PGY-1, [place] Intern pager: [phone], text pages welcome

## 2019-02-03 ENCOUNTER — Ambulatory Visit: Payer: Medicare HMO | Admitting: Family Medicine

## 2019-02-03 LAB — BASIC METABOLIC PANEL
Anion gap: 10 (ref 5–15)
BUN: 21 mg/dL (ref 8–23)
CO2: 23 mmol/L (ref 22–32)
Calcium: 8.6 mg/dL — ABNORMAL LOW (ref 8.9–10.3)
Chloride: 99 mmol/L (ref 98–111)
Creatinine, Ser: 0.62 mg/dL (ref 0.44–1.00)
GFR calc Af Amer: >60 mL/min (ref >60)
GFR calc non Af Amer: >60 mL/min (ref >60)
Glucose, Bld: 95 mg/dL (ref 70–99)
Potassium: 3.7 mmol/L (ref 3.5–5.1)
Sodium: 132 mmol/L — ABNORMAL LOW (ref 135–145)

## 2019-02-03 LAB — NOVEL CORONAVIRUS, NAA (HOSP ORDER, SEND-OUT TO REF LAB; TAT 18-24 HRS): SARS-CoV-2, NAA: NOT DETECTED

## 2019-02-03 LAB — CBC
HCT: 34.5 % — ABNORMAL LOW (ref 36.0–46.0)
Hemoglobin: 11.8 g/dL — ABNORMAL LOW (ref 12.0–15.0)
MCH: 32.2 pg (ref 26.0–34.0)
MCHC: 34.2 g/dL (ref 30.0–36.0)
MCV: 94.3 fL (ref 80.0–100.0)
Platelets: 189 10*3/uL (ref 150–400)
RBC: 3.66 MIL/uL — ABNORMAL LOW (ref 3.87–5.11)
RDW: 14 % (ref 11.5–15.5)
WBC: 11.9 10*3/uL — ABNORMAL HIGH (ref 4.0–10.5)
nRBC: 0 % (ref 0.0–0.2)

## 2019-02-03 NOTE — Progress Notes (Signed)
Patient daughter [name] updated on patient all questions answered. Will monitor patient. Holliday Sheaffer, Bettina Gavia rN

## 2019-02-03 NOTE — Evaluation (Signed)
Clinical/Bedside Swallow Evaluation Patient Details  Name: [name] MRN: [id number] Date of Birth: [birth year]  Today's Date: 02/03/2019 Time: SLP Start Time (ACUTE ONLY): 5631 SLP Stop Time (ACUTE ONLY): 0906 SLP Time Calculation (min) (ACUTE ONLY): 27 min  Past Medical History:  Past Medical History:  Diagnosis Date  . Anemia   . Asthma    once in 1950's  . AVM (arteriovenous malformation)   . Bipolar disorder ([place]) 10/13/2018  . Cancer (HCC)     right leg  skin  . Coronary artery disease   . Depression    sucide attempt in the distant past  . Excessive sweating   . GI bleed   . Hiatal hernia    s/p repair  . HLD (hyperlipidemia)   . HTN (hypertension)   . Hypertension   . Low back pain    ESI by Dr. Nelva Bush 2 weeks ago  . OA (osteoarthritis)    bilateral wrist, bilateral knees  . Obesity   . Obesity   . Ovarian cyst   . Right wrist injury    multiple surgeries and residual weakness.   . Rotator cuff arthropathy    right  . Seizures ([place]) 1980s   2/2 meningioma. none since cranioplasty  . Shortness of breath 02/02/2019  . Tuberculosis    + tb test      (father had)   Past Surgical History:  Past Surgical History:  Procedure Laterality Date  . ABDOMINAL HYSTERECTOMY     left ovaries  . BREAST REDUCTION SURGERY    . CATARACT EXTRACTION Bilateral   . CRANIOPLASTY     2/2 meningioma 1980s  . CYSTECTOMY     rt ovary  age 77   . HIATAL HERNIA REPAIR    . KNEE ARTHROPLASTY     bilat  . LEFT HEART CATHETERIZATION WITH CORONARY ANGIOGRAM N/A 03/26/2012   Procedure: LEFT HEART CATHETERIZATION WITH CORONARY ANGIOGRAM;  Surgeon: Minus Breeding, MD;  Location[facility] CATH LAB;  Service: Cardiovascular;  Laterality: N/A;  . REVERSE SHOULDER ARTHROPLASTY Right 03/29/2016  . REVERSE SHOULDER ARTHROPLASTY Right 03/29/2016   Procedure: RIGHT REVERSE SHOULDER ARTHROPLASTY;  Surgeon: Netta Cedars, MD;  Location: [place];  Service: Orthopedics;  Laterality: Right;   HPI:  Pt  is an 85 y.o. female who presents with generalized weakness and SOB. CXR showed no acute abnormalities. SLP was ordered due to pt report of chronic odynophagia. Pt was previously seen by SLP in 2014 with Dys 3 diet and thin liquids recommended UGI in 2010 showed narrowing at the GI junction consistent with prior Nissen fundoplication with a slow trickle of contrast but without full emptying of the esophagus. 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 Ice was removed and additional boluses were consumed without immediate coughing concerning for reduced airway protection. Pt does have occasional, delayed throat clearing/cough and effortful looking swallows, although she denies any throat pain associated with swallowing today. Given her GI hx, suspect that these symptoms are more consistent with an esophageal component. Pt also denies any h/o PNA, and her CXR was clear upon admission. Do not  think there is a need to alter her diet textures at this point, although she may prefer softer food options from the menu. Aspiration and esophageal precautions should be used, and would also encourage cool to room temperature drinks over ice cold ones. SLP will f/u briefly while she remains in the hospital, but do not anticipate need for additional f/u post-discharge.  SLP Visit Diagnosis: Dysphagia, unspecified (R13.10)    Aspiration Risk  Mild aspiration risk    Diet Recommendation Regular;Thin liquid   Liquid Administration via: Cup;Straw Medication Administration: Whole  meds with puree(crush if larger) Supervision: Staff to assist with self feeding Compensations: Slow rate;Small sips/bites;Follow solids with liquid Postural Changes: Seated upright at 90 degrees;Remain upright for at least 30 minutes after po intake    Other  Recommendations Oral Care Recommendations: Oral care BID   Follow up Recommendations None      Frequency and Duration min 2x/week  1 week       Prognosis Barriers to Reach Goals: Other (Comment)(suspect esophageal in nature, may be more chronic)      Swallow Study   General HPI: Pt is an 85 y.o. female who presents with generalized weakness and SOB. CXR showed no acute abnormalities. SLP was ordered due to pt report of chronic odynophagia. Pt was previously seen by SLP in 2014 with Dys 3 diet and thin liquids recommended UGI in 2010 showed narrowing at the GI junction consistent with prior Nissen fundoplication with a slow trickle of contrast but without full emptying of the esophagus. PMH also includes: GERD, HH, meningioma s/p craniotomy on the right side, subdural hemorrhage with resulting right parietal encephalomalacia, anemia, asthma, bipolar disorder, depression, CAD, hypertension, hyperlipidemia, history of GI bleed, OA, seizures Type of Study: Bedside Swallow Evaluation Previous Swallow Assessment: see HPI Diet Prior to this Study: Regular;Thin liquids Temperature Spikes Noted: No Respiratory Status: Room air History of Recent Intubation: No Behavior/Cognition: Alert;Cooperative Oral Cavity Assessment: Dry Oral Care Completed by SLP: No Oral Cavity - Dentition: Adequate natural dentition Vision: Functional for self-feeding Self-Feeding Abilities: Able to feed self;Needs assist Patient Positioning: Upright in bed Baseline Vocal Quality: Normal Volitional Swallow: Able to elicit    Oral/Motor/Sensory Function Overall Oral Motor/Sensory Function: Within functional limits   Ice Chips Ice chips: Not tested   Thin  Liquid Thin Liquid: Impaired Presentation: Cup;Self Fed;Straw Pharyngeal  Phase Impairments: Cough - Delayed;Other (comments)(effortful appearing swallow)    Nectar Thick Nectar Thick Liquid: Not tested   Honey Thick Honey Thick Liquid: Not tested   Puree Puree: Impaired Presentation: Self Fed;Spoon Pharyngeal Phase Impairments: (effortful appearing swallow)   Solid     Solid: Impaired Presentation: Self Fed Pharyngeal Phase Impairments: (effortful appearing swallow)      Mickel Baas P Ronon Ferger 02/03/2019,9:15 AM   Pollyann Glen, M.A. [place] Acute Environmental education officer [phone] Office [phone]

## 2019-02-03 NOTE — Progress Notes (Signed)
Physical Therapy Treatment Patient Details Name: [name] MRN: [id number] DOB: 1931/07/02 Today's Date: 02/03/2019    History of Present Illness [name] is a 85 y.o. 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  Patient reports having generalized weakness and dyspnea on exertion for the past few months.     PT Comments    Pt requires max A for bed mobility and sit <> stand today. Very lethargic with difficulty keeping eyes open during session. Pt continues to be appropriate for SNF placement   Follow Up Recommendations  SNF     Equipment Recommendations  Rolling walker with 5" wheels;3in1 (PT)    Recommendations for Other Services       Precautions / Restrictions Precautions Precautions: Fall Restrictions Weight Bearing Restrictions: No    Mobility  Bed Mobility   Bed Mobility: Supine to Sit     Supine to sit: Mod assist     General bed mobility comments: mod A for LEs and trunk  Transfers   Equipment used: Rolling walker (2 wheeled) Transfers: Sit to/from Stand Sit to Stand: Max assist         General transfer comment: max A for sit <> stand x 2, pt limited by shoulder pain, unable to maintain standing > 2-3 seconds  Ambulation/Gait                 Stairs             Wheelchair Mobility    Modified Rankin (Stroke Patients Only)       Balance Overall balance assessment: Needs assistance Sitting-balance support: Feet supported;Bilateral upper extremity supported Sitting balance-Leahy Scale: Fair Sitting balance - Comments: requires min A and bilat UE support                                    Cognition Arousal/Alertness: Lethargic Behavior During Therapy: Flat affect Overall Cognitive Status: Difficult to assess                                 General Comments: lethargic      Exercises      General Comments        Pertinent Vitals/Pain Pain Assessment: No/denies pain Faces Pain Scale: Hurts even more Pain Location: reports pain "all over" Pain Descriptors / Indicators: Grimacing;Guarding Pain Intervention(s): Limited activity within patient's tolerance;Monitored during session    Home Living                      Prior Function            PT Goals (current goals can now be found in the care plan section) Progress towards PT goals: Progressing toward goals    Frequency    Min 2X/week      PT Plan Current plan remains appropriate    Co-evaluation              AM-PAC PT "6 Clicks" Mobility   Outcome Measure  Help needed turning from your back to your side while in a flat bed without using bedrails?:  A Lot Help needed moving from lying on your back to sitting on the side of a flat bed without using bedrails?: A Lot Help needed moving to and from a bed to a chair (including a wheelchair)?: A Lot   Help needed to walk in hospital room?: Total Help needed climbing 3-5 steps with a railing? : Total 6 Click Score: 8    End of Session Equipment Utilized During Treatment: Gait belt Activity Tolerance: Patient limited by fatigue Patient left: in bed;with call bell/phone within reach;with bed alarm set   PT Visit Diagnosis: Unsteadiness on feet (R26.81);Other abnormalities of gait and mobility (R26.89);Repeated falls (R29.6);Muscle weakness (generalized) (M62.81);History of falling (Z91.81)     Time: 1020-1040 PT Time Calculation (min) (ACUTE ONLY): 20 min  Charges:  $Therapeutic Activity: 8-22 mins                     Isabelle Course, PT, DPT   Kabir Brannock 02/03/2019, 12:27 PM

## 2019-02-03 NOTE — Telephone Encounter (Signed)
Patient in the hospital.  She has had a serious decline in her general health and functional status.  She can no longer liver alone.  I believe involving hospice is reasonable at this point.

## 2019-02-03 NOTE — Progress Notes (Signed)
Social visit, I am PCP. Patient is much weaker and more withdrawn than a few months ago.  She tells me that she can't stop thinking about me telling her that she is "in the last chapter of her life."  She may have a reversible element of deconditioning or depression.  However, she tells me she is not having any fun and her goal is to be comfortable.  I believe a palliative care referral and likely hospice would be appropriate at this point.

## 2019-02-03 NOTE — NC FL2 (Signed)
[place] LEVEL OF CARE SCREENING TOOL     IDENTIFICATION  Patient Name: [name] Birthdate: [birth year] Sex: female Admission Date (Current Location): 01/30/2019  [facility] and [place] Number:  Herbalist and Address:  [facility] [place], [place] [address], [place],  [address]      Provider Number: [id number]  Attending Physician Name and Address:  Kinnie Feil, MD  Relative Name and Phone Number:  [name]  [phone]     [name] [phone]    Current Level of Care: Hospital Recommended Level of Care: [place] Prior Approval Number:    Date Approved/Denied:   PASRR Number: Pending  Discharge Plan: SNF    Current Diagnoses: Patient Active Problem List   Diagnosis Date Noted  . Shortness of breath 02/02/2019  . SOB (shortness of breath) 01/30/2019  . Weakness 01/30/2019  . Thrombocytopenia ([place]) 01/30/2019  . Asthma 01/30/2019  . Encounter for palliative care 09/30/2018  . High risk social situation 07/30/2018  . 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 Frequent falls 12/25/2017  . Weight loss, non-intentional 08/14/2017  . S/P shoulder replacement 03/29/2016  . Diverticulitis of colon without hemorrhage 12/04/2015  . Rotator cuff syndrome of left shoulder 10/26/2015  . Cervical disc disorder with radiculopathy of cervical region 10/26/2015  . Heart murmur, systolic 65/10/5463  . Rosacea 09/07/2015  . Adenomatous colon polyp 04/12/2014  . Hearing loss in left ear 04/13/2013  . Coronary artery disease 03/25/2012  . Hypokalemia 03/25/2012  . Osteoporosis 11/13/2011  . Pure hypercholesterolemia 10/09/2006  . Major depression, recurrent, chronic ([place]) 10/09/2006  . Hereditary and idiopathic peripheral neuropathy 10/09/2006  . HYPERTENSION, BENIGN SYSTEMIC 10/09/2006  . Osteoarthritis, multiple sites 10/09/2006  . Seizures ([place]) 10/09/2006    Orientation RESPIRATION BLADDER Height &  Weight     Self, Place  Normal External catheter, Incontinent Weight: 173 lb 11.6 oz (78.8 kg) Height:  5\' 6"  (167.6 cm)  BEHAVIORAL SYMPTOMS/MOOD NEUROLOGICAL BOWEL NUTRITION STATUS      Continent Diet(Please see discharge summary)  AMBULATORY STATUS COMMUNICATION OF NEEDS Skin       Normal                       Personal Care Assistance Level of Assistance              Functional Limitations Info  Sight, Hearing, Speech Sight Info: Adequate Hearing Info: Adequate Speech Info: Adequate    SPECIAL CARE FACTORS FREQUENCY  PT (By licensed PT), OT (By licensed OT)     PT Frequency: 3x per week OT Frequency: 3x per week            Contractures Contractures Info: Not present    Additional Factors Info  Code Status, Allergies Code Status Info: DNR Allergies Info: Amoxicillin,Penicillins,Keppra,Phenobarbital,Phenytoin,Tape           Current Medications (02/03/2019):  This is the current hospital active medication list Current Facility-Administered Medications  Medication Dose Route Frequency Provider Last Rate Last Dose  . acetaminophen (TYLENOL) tablet 650 mg  650 mg Oral Q4H PRN Shela Leff, MD   650 mg at 02/01/19 6812  . albuterol (PROVENTIL) (2.5 MG/3ML) 0.083% nebulizer solution 2.5 mg  2.5 mg Inhalation Q4H PRN Donne Hazel, MD      . aspirin EC tablet 81 mg  81 mg Oral Daily Edwin Dada, MD   81 mg at 02/03/19 0844  . atorvastatin (LIPITOR) tablet  40 mg  40 mg Oral q1800 Edwin Dada, MD   40 mg at 02/02/19 1721  . Benzocaine (HURRCAINE) 20 % mouth spray   Mouth/Throat QID PRN Bonnita Hollow, MD      . divalproex (DEPAKOTE) DR tablet 500 mg  500 mg Oral Daily Hammons, Kimberly B, RPH   500 mg at 02/03/19 0956   And  . divalproex (DEPAKOTE) DR tablet 1,000 mg  1,000 mg Oral QHS Hammons, Kimberly B, RPH   1,000 mg at 02/02/19 2235  . enoxaparin (LOVENOX) injection 40 mg  40 mg Subcutaneous Daily Shela Leff, MD   40  mg at 02/03/19 0844  . FLUoxetine (PROZAC) capsule 20 mg  20 mg Oral Daily Josephine Igo B, MD   20 mg at 02/02/19 1434  . hydrALAZINE (APRESOLINE) injection 5 mg  5 mg Intravenous Q4H PRN Shela Leff, MD   5 mg at 02/01/19 0420  . isosorbide mononitrate (IMDUR) 24 hr tablet 15 mg  15 mg Oral Daily Danford, Suann Larry, MD   15 mg at 02/02/19 1434  . metoprolol tartrate (LOPRESSOR) tablet 12.5 mg  12.5 mg Oral BID Edwin Dada, MD   12.5 mg at 02/03/19 0844  . Muscle Rub CREA 1 application  1 application Topical PRN Meccariello, Bernita Raisin, DO   1 application at 16/10/96 0844  . ondansetron (ZOFRAN) injection 4 mg  4 mg Intravenous Q6H PRN Shela Leff, MD      . QUEtiapine (SEROQUEL) tablet 12.5 mg  12.5 mg Oral QHS Matilde Haymaker, MD   12.5 mg at 02/02/19 2235     Discharge Medications: Please see discharge summary for a list of discharge medications.  Relevant Imaging Results:  Relevant Lab Results:   Additional Information SSN [place], [place]

## 2019-02-03 NOTE — Progress Notes (Signed)
Patient daughter [name] called to speak with patient. Transferred call into room and patient able to talk with daughter briefly with the help of nursing staff. Patient sleepy at this time. Will monitor patient. Deidrea Gaetz, Bettina Gavia RN

## 2019-02-03 NOTE — Progress Notes (Signed)
Family Medicine Teaching Service Daily Progress Note Intern Pager: [phone]  Patient name: [name] Medical record number: [id number] Date of birth: 06/07/1931 Age: 85 y.o. Gender: female  Primary Care Provider: [name], MD Consultants: Cardiology Code Status: DNR  Pt Overview and Major Events to Date:  6/20 Admitted to [facility] 6/22 Care transferred to [place] and Plan: [name] is a 85 y.o. F with CAD no prior PCI, HTN, hx meningioma/craniotomy, hx seizures, hx SDH with right parietal encephalomalacia, asthma, anemia, bipolar disorder, and GI bleed who presented with generalized weakness and dyspnea with exertion, progressive for few months. She is medically stable for discharge to SNF.  Exertional intolerance, possible anginal equivalent  H/o CAD  Elevated Troponin Troponins trended flat, per cardiology dyspnea on exertion likely secondary to deconditioning.  Cardiology signed off.  SNF recommended for patient, patient, her daughter agree.  Pending SNF placement at present.  Send out coronavirus test sent on 6/23. - cardiology consulted and recommend continued medical management - Continue home ASA and Atorvastatin - Continue home Imdur - Continue Metoprolol -PT/OT recommend SNF, patient amenable -Follow-up social work for SNF placement -COVID pending  Acute on Chronic diastolic CHF: Echo with preserved ejection fraction, noted some diastolic dysfunction.  Patient not currently on diuretic. - cardiology signed off  HTN: BP this AM 130/53. home meds: Imdur and HCTZ. Started on Metoprolol on 6/21 by [facility]. - cont imdur - cont metoprolol 12.5mg  BID  Odynophagia Patient notes chronic odynophagia.   -Follow-up SLP consult  Mild Hyponatremia Na remained stable at 132. - cont to monitor  Leukocytosis WBC 11.9, up from 7.8.  Patient has been afebrile and otherwise stable. -Continue to monitor WBC  Seizures: Valproic acid  level 51, WNL. Home meds: Depakote 500mg : 1 PO in AM, 2 PO in PM.  First seziure Dec 2019 shortly after fall when she hit her head.   -Continue Depakote at home dose - recommend outpatient follow up with neurology  Bipolar Disorder: - decrease fluoxetine to 20 mg, Seroquel 12.5 mg - keep Valproic acid as above  Anemia: Hgb stable at 11.8. History of GI bleed. No signs of bleeding on exam - continue to monitor  FEN/GI: Heart Healthy PPx: Lovenox  Disposition: Medically stable for discharge to SNF  Subjective:  Patient denies complaints this morning.  No acute events overnight.  Objective: Temp:  [98.1 F (36.7 C)-99.2 F (37.3 C)] 98.1 F (36.7 C) (06/24 0500) Pulse Rate:  [65-71] 67 (06/24 0500) Resp:  [16-21] 18 (06/24 0500) BP: (121-157)/(53-64) 130/53 (06/24 0500) SpO2:  [93 %-95 %] 93 % (06/24 0500) Weight:  [78.8 kg] 78.8 kg (06/24 0500)  Physical Exam:  General: 85 y.o. female in NAD Cardio: RRR no m/r/g Lungs: CTAB, no wheezing, no rhonchi, no crackles, no IWOB on RA Abdomen: Soft, non-tender to palpation, non-distended, positive bowel sounds Skin: warm and dry Extremities: Trace bilateral lower extremity edema   Laboratory: Recent Labs  Lab 01/31/19 0419 02/01/19 0407 02/03/19 0453  WBC 8.0 7.8 11.9*  HGB 11.5* 11.9* 11.8*  HCT 34.2* 35.5* 34.5*  PLT 158 153 189   Recent Labs  Lab 01/30/19 1848 01/31/19 0419 02/01/19 0407 02/03/19 0453  NA 134* 136 132* 132*  K 3.1* 4.2 3.7 3.7  CL 100 102 99 99  CO2 21* 25 25 23   BUN 22 19 16 21   CREATININE 0.58 0.64 0.56 0.62  CALCIUM 9.0 9.0 8.5* 8.6*  PROT 5.9*  --   --   --  BILITOT 0.5  --   --   --   ALKPHOS 70  --   --   --   ALT 10  --   --   --   AST 20  --   --   --   GLUCOSE 112* 99 124* 95   ECHO 01/31/2019 IMPRESSIONS  1. The left ventricle has hyperdynamic systolic function, with an ejection fraction of >65%. The cavity size was normal. Left ventricular diastolic Doppler parameters are  consistent with impaired relaxation.  2. The right ventricle has normal systolic function. The cavity was normal. There is no increase in right ventricular wall thickness.  3. There is moderate mitral annular calcification present.  4. The aortic valve is tricuspid. Moderate thickening of the aortic valve. Moderate calcification of the aortic valve. Aortic valve regurgitation was not assessed by color flow Doppler.  Imaging/Diagnostic Tests: Dg Pelvis Portable  Result Date: 01/30/2019 CLINICAL DATA:  Weakness and decreased mobility. EXAM: PORTABLE PELVIS 1-2 VIEWS COMPARISON:  11/19/2012 radiographs FINDINGS: There is no evidence of pelvic fracture or diastasis. No pelvic bone lesions are seen. IMPRESSION: Negative. Electronically Signed   By: Margarette Canada M.D.   On: 01/30/2019 19:34   Dg Chest Portable 1 View  Result Date: 01/30/2019 CLINICAL DATA:  Weakness EXAM: PORTABLE CHEST 1 VIEW COMPARISON:  07/22/2018 and prior radiographs FINDINGS: The cardiomediastinal silhouette is unchanged with stable SUPERIOR mediastinal fullness. There is no evidence of focal airspace disease, pulmonary edema, suspicious pulmonary nodule/mass, pleural effusion, or pneumothorax. No acute bony abnormalities are identified. RIGHT shoulder arthroplasty changes again noted. IMPRESSION: No acute abnormality. Electronically Signed   By: Margarette Canada M.D.   On: 01/30/2019 19:33   [name], Bernita Raisin, DO 02/03/2019, 7:50 AM PGY-1, [place] Intern pager: [phone], text pages welcome

## 2019-02-04 DIAGNOSIS — Z609 Problem related to social environment, unspecified: Secondary | ICD-10-CM

## 2019-02-04 DIAGNOSIS — E7800 Pure hypercholesterolemia, unspecified: Secondary | ICD-10-CM | POA: Diagnosis not present

## 2019-02-04 DIAGNOSIS — Z743 Need for continuous supervision: Secondary | ICD-10-CM | POA: Diagnosis not present

## 2019-02-04 DIAGNOSIS — Z515 Encounter for palliative care: Secondary | ICD-10-CM

## 2019-02-04 DIAGNOSIS — E876 Hypokalemia: Secondary | ICD-10-CM | POA: Diagnosis not present

## 2019-02-04 DIAGNOSIS — R29898 Other symptoms and signs involving the musculoskeletal system: Secondary | ICD-10-CM | POA: Diagnosis not present

## 2019-02-04 DIAGNOSIS — F319 Bipolar disorder, unspecified: Secondary | ICD-10-CM | POA: Diagnosis not present

## 2019-02-04 DIAGNOSIS — R0600 Dyspnea, unspecified: Secondary | ICD-10-CM | POA: Diagnosis not present

## 2019-02-04 DIAGNOSIS — R600 Localized edema: Secondary | ICD-10-CM | POA: Diagnosis not present

## 2019-02-04 DIAGNOSIS — R0602 Shortness of breath: Secondary | ICD-10-CM | POA: Diagnosis not present

## 2019-02-04 DIAGNOSIS — M6281 Muscle weakness (generalized): Secondary | ICD-10-CM | POA: Diagnosis not present

## 2019-02-04 DIAGNOSIS — I5033 Acute on chronic diastolic (congestive) heart failure: Secondary | ICD-10-CM | POA: Diagnosis not present

## 2019-02-04 DIAGNOSIS — Z7189 Other specified counseling: Secondary | ICD-10-CM | POA: Diagnosis not present

## 2019-02-04 DIAGNOSIS — I10 Essential (primary) hypertension: Secondary | ICD-10-CM | POA: Diagnosis not present

## 2019-02-04 DIAGNOSIS — F331 Major depressive disorder, recurrent, moderate: Secondary | ICD-10-CM

## 2019-02-04 DIAGNOSIS — R569 Unspecified convulsions: Secondary | ICD-10-CM | POA: Diagnosis not present

## 2019-02-04 DIAGNOSIS — R627 Adult failure to thrive: Secondary | ICD-10-CM

## 2019-02-04 DIAGNOSIS — F332 Major depressive disorder, recurrent severe without psychotic features: Secondary | ICD-10-CM | POA: Diagnosis not present

## 2019-02-04 DIAGNOSIS — F419 Anxiety disorder, unspecified: Secondary | ICD-10-CM | POA: Diagnosis not present

## 2019-02-04 DIAGNOSIS — R531 Weakness: Secondary | ICD-10-CM | POA: Diagnosis not present

## 2019-02-04 DIAGNOSIS — F4323 Adjustment disorder with mixed anxiety and depressed mood: Secondary | ICD-10-CM | POA: Diagnosis not present

## 2019-02-04 DIAGNOSIS — F0390 Unspecified dementia without behavioral disturbance: Secondary | ICD-10-CM | POA: Diagnosis not present

## 2019-02-04 DIAGNOSIS — R5381 Other malaise: Secondary | ICD-10-CM | POA: Diagnosis not present

## 2019-02-04 DIAGNOSIS — I959 Hypotension, unspecified: Secondary | ICD-10-CM | POA: Diagnosis not present

## 2019-02-04 DIAGNOSIS — R0989 Other specified symptoms and signs involving the circulatory and respiratory systems: Secondary | ICD-10-CM | POA: Diagnosis not present

## 2019-02-04 DIAGNOSIS — R52 Pain, unspecified: Secondary | ICD-10-CM | POA: Diagnosis not present

## 2019-02-04 DIAGNOSIS — M25511 Pain in right shoulder: Secondary | ICD-10-CM | POA: Diagnosis not present

## 2019-02-04 DIAGNOSIS — I110 Hypertensive heart disease with heart failure: Secondary | ICD-10-CM | POA: Diagnosis not present

## 2019-02-04 DIAGNOSIS — F05 Delirium due to known physiological condition: Secondary | ICD-10-CM | POA: Diagnosis not present

## 2019-02-04 DIAGNOSIS — G9340 Encephalopathy, unspecified: Secondary | ICD-10-CM | POA: Diagnosis not present

## 2019-02-04 DIAGNOSIS — R279 Unspecified lack of coordination: Secondary | ICD-10-CM | POA: Diagnosis not present

## 2019-02-04 DIAGNOSIS — I2510 Atherosclerotic heart disease of native coronary artery without angina pectoris: Secondary | ICD-10-CM | POA: Diagnosis not present

## 2019-02-04 DIAGNOSIS — W19XXXA Unspecified fall, initial encounter: Secondary | ICD-10-CM | POA: Diagnosis not present

## 2019-02-04 DIAGNOSIS — F3170 Bipolar disorder, currently in remission, most recent episode unspecified: Secondary | ICD-10-CM | POA: Diagnosis not present

## 2019-02-04 LAB — BASIC METABOLIC PANEL
Anion gap: 8 (ref 5–15)
BUN: 23 mg/dL (ref 8–23)
CO2: 23 mmol/L (ref 22–32)
Calcium: 8.3 mg/dL — ABNORMAL LOW (ref 8.9–10.3)
Chloride: 100 mmol/L (ref 98–111)
Creatinine, Ser: 0.56 mg/dL (ref 0.44–1.00)
GFR calc Af Amer: >60 mL/min (ref >60)
GFR calc non Af Amer: >60 mL/min (ref >60)
Glucose, Bld: 90 mg/dL (ref 70–99)
Potassium: 3.4 mmol/L — ABNORMAL LOW (ref 3.5–5.1)
Sodium: 131 mmol/L — ABNORMAL LOW (ref 135–145)

## 2019-02-04 LAB — CBC
HCT: 33.3 % — ABNORMAL LOW (ref 36.0–46.0)
Hemoglobin: 11.5 g/dL — ABNORMAL LOW (ref 12.0–15.0)
MCH: 32.2 pg (ref 26.0–34.0)
MCHC: 34.5 g/dL (ref 30.0–36.0)
MCV: 93.3 fL (ref 80.0–100.0)
Platelets: 208 10*3/uL (ref 150–400)
RBC: 3.57 MIL/uL — ABNORMAL LOW (ref 3.87–5.11)
RDW: 13.9 % (ref 11.5–15.5)
WBC: 10.3 10*3/uL (ref 4.0–10.5)
nRBC: 0 % (ref 0.0–0.2)

## 2019-02-04 MED ORDER — QUETIAPINE FUMARATE 25 MG PO TABS: 12.5000 mg | ORAL_TABLET | Freq: Every evening | ORAL | Status: AC | PRN

## 2019-02-04 MED ORDER — POTASSIUM CHLORIDE CRYS ER 20 MEQ PO TBCR
40.0000 meq | EXTENDED_RELEASE_TABLET | Freq: Once | ORAL | Status: AC
  Administered 2019-02-04: 40 meq via ORAL
  Filled 2019-02-04: qty 2

## 2019-02-04 MED ORDER — FLUOXETINE HCL 20 MG PO CAPS: 20.0000 mg | ORAL_CAPSULE | Freq: Every day | ORAL | 0 refills | Status: AC

## 2019-02-04 MED ORDER — METOPROLOL TARTRATE 25 MG PO TABS: 12.5000 mg | ORAL_TABLET | Freq: Two times a day (BID) | ORAL | 0 refills | Status: AC

## 2019-02-04 NOTE — Plan of Care (Signed)
  Problem: Activity: Goal: Risk for activity intolerance will decrease Outcome: Progressing   

## 2019-02-04 NOTE — Progress Notes (Signed)
  Speech Language Pathology Treatment: Dysphagia  Patient Details Name: [name] MRN: [id number] DOB: 1930-10-11 Today's Date: 02/04/2019 Time: 9983-3825 SLP Time Calculation (min) (ACUTE ONLY): 9 min  Assessment / Plan / Recommendation Clinical Impression  Pt was agreeable to trials of thin liquids only. She had a delayed cough and increased facial grimacing as she took large, consecutive straw sips of thin liquids. She did express odynophagia today again, but was not able to tell me where she had pain with swallowing, just that it was "somewhere down there." SLP provided Min cues for smaller sips with no further coughing and pt subjectively reporting that it felt more comfortable. Continue to suspect a primary esophageal component, although there is the potential for secondary pharyngeal issues. Would continue to use aspiration and esophageal precautions. SLP will continue to follow as she remains in house.   HPI HPI: Pt is an 85 y.o. female who presents with generalized weakness and SOB. CXR showed no acute abnormalities. SLP was ordered due to pt report of chronic odynophagia. Pt was previously seen by SLP in 2014 with Dys 3 diet and thin liquids recommended UGI in 2010 showed narrowing at the GI junction consistent with prior Nissen fundoplication with a slow trickle of contrast but without full emptying of the esophagus. PMH also includes: GERD, HH, meningioma s/p craniotomy on the right side, subdural hemorrhage with resulting right parietal encephalomalacia, anemia, asthma, bipolar disorder, depression, CAD, hypertension, hyperlipidemia, history of GI bleed, OA, seizures      SLP Plan  Continue with current plan of care       Recommendations  Diet recommendations: Regular;Thin liquid Liquids provided via: Cup;Straw Medication Administration: Whole meds with puree(crush larger ones) Supervision: Staff to assist with self feeding Compensations: Slow rate;Small sips/bites;Follow  solids with liquid Postural Changes and/or Swallow Maneuvers: Seated upright 90 degrees;Upright 30-60 min after meal                Oral Care Recommendations: Oral care BID Follow up Recommendations: Skilled Nursing facility SLP Visit Diagnosis: Dysphagia, unspecified (R13.10) Plan: Continue with current plan of care       GO                Venita Sheffield Evangelynn Lochridge 02/04/2019, 11:40 AM  Pollyann Glen, M.A. [place] Acute Environmental education officer [phone] Office [phone]

## 2019-02-04 NOTE — TOC Progression Note (Signed)
Transition of Care [facility]) - Progression Note    Patient Details  Name: [name] MRN: [id number] Date of Birth: 09/17/1930  Transition of Care [facility]) CM/SW [place], [place] Phone Number: 02/04/2019, 5:37 PM  Clinical Narrative:    Patient will DC to: [place] Date: 02/04/2019 Family Notified: [name], daughter  Transport By: Corey Harold  RN, patient, and facility notified of DC. Discharge Summary sent to facility. RN given number for report [phone].  Ambulance transport requested for patient.   Clinical Social Worker signing off.  Thurmond Butts, MSW, [facility] Clinical Social Worker [phone]     Expected Discharge Plan: Skilled Nursing Facility Barriers to Discharge: Barriers Resolved  Expected Discharge Plan and Services Expected Discharge Plan: [place] arrangements for the past 2 months: Single Family Home Expected Discharge Date: 02/04/19                                     Social Determinants of Health (SDOH) Interventions    Readmission Risk Interventions No flowsheet data found.

## 2019-02-04 NOTE — Progress Notes (Signed)
Patient daughter [name] called and spoke with patient directly this AM. Oleg Oleson, Bettina Gavia RN

## 2019-02-04 NOTE — Progress Notes (Signed)
Doctor on call paged through [place] sytem that patient had authorization for SNF.needing orders for discharge. Niyla Marone, Bettina Gavia RN

## 2019-02-04 NOTE — Consult Note (Signed)
Consultation Note Date: 02/04/2019   Patient Name: [name]  DOB: May 01, 1931  MRN: [id number]  Age / Sex: 85 y.o., female  PCP: [name], MD Referring Physician: [name], MD  Reason for Consultation: Establishing goals of care  HPI/Patient Profile: 85 y.o. 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 She has had gradual functional decline since fall, SDH, and seizures Nov-Dec 2020. Overall failure to thrive. Evaluated for chest pain and CAD with improvement and medication changes per cardiology.   Clinical Assessment and Goals of Care: I met today with [name]. She is alert and appears mostly oriented. She is in no distress. She tells me that she is "feeling better than when I came." She is able to endorse her physical decline and struggles at home. She agrees to SNF rehab and she tells me that she is hopeful for improvement with therapy but when I ask if therapy does not get her back to a good QOL she tells me "oh I doubt it will." We discussed her wishes if she does not have much improvement. We discussed continued care and rehospitalization, blood work, tests, etc and she says "oh no, I don't want all that." We discussed the alternative of focusing on comfort care and even the utilization of hospice and she tells me "yes, I would want that." She seems content and has no concerns. Says her appetite "was better today" but fluctuates. She names daughter, [name], and Donnalee Curry, as her surrogate decision makers but there is no documentation as far as I am aware.   I called and spoke with daughter, [name], with Ms. Gains permission. [name]'s main concern is the fact that  her mother is going to SNF in the midst of COVID pandemic. She is terrified that her mother will catch COVID and die. She is extremely frustrated and frightened that there are no options to provide support to her mother at home that they can afford. She is confused about her mother's status and her improvement and I explained that her mother is more alert and oriented now but so weak she can barely hold up a cup to take a drink of water. She reflects that she has attempted to convince her mother to come live with her in [place] over the past years but her mother has been resistant. She feels her mother is "giving up" and that she would likely improve if they could be with her and provide encouragement.  I offered therapeutic listening and emotional support.   All questions/concerns addressed. Emotional support provided.   Primary Decision Maker PATIENT    SUMMARY OF RECOMMENDATIONS   - SNF rehab with palliative - Consider transition to hospice care with further decline  Code Status/Advance Care Planning:  DNR already in place   Symptom Management:   Weakness: continue therapy.   Palliative  Prophylaxis:   Aspiration, Bowel Regimen, Delirium Protocol, Frequent Pain Assessment, Oral Care and Turn Reposition  Psycho-social/Spiritual:   Desire for further Chaplaincy support:no  Additional Recommendations: Caregiving  Support/Resources and Education on Hospice  Prognosis:   < 6 months with functional decline and failure to thrive.   Discharge Planning: [place] for rehab with Palliative care service follow-up      Primary Diagnoses: Present on Admission: . SOB (shortness of breath) . Shortness of breath   I have reviewed the medical record, interviewed the patient and family, and examined the patient. The following aspects are pertinent.  Past Medical History:  Diagnosis Date  . Anemia   . Asthma    once in 1950's  . AVM (arteriovenous malformation)    . Bipolar disorder ([place]) 10/13/2018  . Cancer (HCC)     right leg  skin  . Coronary artery disease   . Depression    sucide attempt in the distant past  . Excessive sweating   . GI bleed   . Hiatal hernia    s/p repair  . HLD (hyperlipidemia)   . HTN (hypertension)   . Hypertension   . Low back pain    ESI by Dr. Nelva Bush 2 weeks ago  . OA (osteoarthritis)    bilateral wrist, bilateral knees  . Obesity   . Obesity   . Ovarian cyst   . Right wrist injury    multiple surgeries and residual weakness.   . Rotator cuff arthropathy    right  . Seizures ([place]) 1980s   2/2 meningioma. none since cranioplasty  . Shortness of breath 02/02/2019  . Tuberculosis    + tb test      (father had)   Social History   Socioeconomic History  . Marital status: Divorced    Spouse name: Not on file  . Number of children: Not on file  . Years of education: Not on file  . Highest education level: Not on file  Occupational History  . Occupation: Retired- Health visitor  Social Needs  . Financial resource strain: Not on file  . Food insecurity    Worry: Not on file    Inability: Not on file  . Transportation needs    Medical: Not on file    Non-medical: Not on file  Tobacco Use  . Smoking status: Never Smoker  . Smokeless tobacco: Never Used  Substance and Sexual Activity  . Alcohol use: No  . Drug use: No  . Sexual activity: Never  Lifestyle  . Physical activity    Days per week: Not on file    Minutes per session: Not on file  . Stress: Not on file  Relationships  . Social Herbalist on phone: Not on file    Gets together: Not on file    Attends religious service: Not on file    Active member of club or organization: Not on file    Attends meetings of clubs or organizations: Not on file    Relationship status: Not on file  Other Topics Concern  . Not on file  Social History Narrative   Patient identified her friend, [name], as her emergency contact at [phone].    Lives   Caffeine use:    Family History  Problem Relation Age of Onset  . Alzheimer's disease Mother   . Heart disease Father        MI 62yo  . Heart disease Brother  Scheduled Meds: . aspirin EC  81 mg Oral Daily  . atorvastatin  40 mg Oral q1800  . divalproex  500 mg Oral Daily   And  . divalproex  1,000 mg Oral QHS  . enoxaparin (LOVENOX) injection  40 mg Subcutaneous Daily  . FLUoxetine  20 mg Oral Daily  . isosorbide mononitrate  15 mg Oral Daily  . metoprolol tartrate  12.5 mg Oral BID  . QUEtiapine  12.5 mg Oral QHS   Continuous Infusions: PRN Meds:.acetaminophen, albuterol, Benzocaine, hydrALAZINE, Muscle Rub, ondansetron (ZOFRAN) IV Allergies  Allergen Reactions  . Amoxicillin Anaphylaxis  . Penicillins Anaphylaxis and Swelling    From childhood: Has patient had a PCN reaction causing immediate rash, facial/tongue/throat swelling, SOB or lightheadedness with hypotension: Yes Has patient had a PCN reaction causing severe rash involving mucus membranes or skin necrosis: Unk Has patient had a PCN reaction that required hospitalization: No Has patient had a PCN reaction occurring within the last 10 years: No If all of the above answers are "NO", then may proceed with Cephalosporin use. .  [name] [Levetiracetam]     Attributes worsening anxiety to keppra.  . Phenobarbital Other (See Comments)    Reaction not recalled by the patient  . Phenytoin Other (See Comments)    "Makes me feel funny"   . Tape Rash    Please use paper tape   Review of Systems  Constitutional: Positive for activity change, appetite change and fatigue.  Respiratory: Negative for chest tightness and shortness of breath.   Neurological: Positive for weakness.    Physical Exam Vitals signs and nursing note reviewed.  Constitutional:      General: She is not in acute distress.    Appearance: She is ill-appearing.  Cardiovascular:     Rate and Rhythm: Normal rate.  Pulmonary:      Effort: Pulmonary effort is normal. No tachypnea, accessory muscle usage or respiratory distress.  Abdominal:     Palpations: Abdomen is soft.  Neurological:     Mental Status: She is alert.     Comments: Mostly oriented     Vital Signs: BP (!) 131/58 (BP Location: Right Arm)   Pulse 67   Temp 97.9 F (36.6 C) (Oral)   Resp (!) 22   Ht 5' 6"  (1.676 m)   Wt 77.8 kg   SpO2 94%   BMI 27.68 kg/m  Pain Scale: 0-10   Pain Score: Asleep   SpO2: SpO2: 94 % O2 Device:SpO2: 94 % O2 Flow Rate: .   IO: Intake/output summary:   Intake/Output Summary (Last 24 hours) at 02/04/2019 1239 Last data filed at 02/04/2019 4854 Gross per 24 hour  Intake 315 ml  Output 200 ml  Net 115 ml    LBM: Last BM Date: 01/31/19 Baseline Weight: Weight: 89.4 kg Most recent weight: Weight: 77.8 kg     Palliative Assessment/Data:     Time In: 1400 Time Out: 1500 Time Total: 60 min Greater than 50%  of this time was spent counseling and coordinating care related to the above assessment and plan.  Signed by: Vinie Sill, NP Palliative Medicine Team Pager # [phone] (M-F 8a-5p) Team Phone # [phone] (Nights/Weekends)   The above conversation was completed via telephone due to the visitor restrictions during the COVID-19 pandemic. Thorough chart review and discussion with necessary members of the care team was completed as part of assessment. All issues were discussed and addressed but no physical exam was performed.

## 2019-02-04 NOTE — Progress Notes (Addendum)
Family Medicine Teaching Service Daily Progress Note Intern Pager: [phone]  Patient name: [name] Medical record number: [id number] Date of birth: 1931/05/17 Age: 85 y.o. Gender: female  Primary Care Provider: [name], MD Consultants: Cardiology Code Status: DNR  Pt Overview and Major Events to Date:  6/20 Admitted to [facility] 6/22 Care transferred to [place] and Plan: Mrs. [name] is a 85 y.o. F with CAD no prior PCI, HTN, hx meningioma/craniotomy, hx seizures, hx SDH with right parietal encephalomalacia, asthma, anemia, bipolar disorder, and GI bleed who presented with generalized weakness and dyspnea with exertion, progressive for few months. She is medically stable for discharge to SNF.  Exertional intolerance, possible anginal equivalent  H/o CAD  Elevated Troponin Troponins trended flat, per cardiology dyspnea on exertion likely secondary to deconditioning.  Cardiology signed off.  SNF recommended for patient, patient, her daughter agree.  Patient is medically stable for discharge to SNF.  COVID negative. - cardiology consulted and recommend continued medical management - Continue home ASA and Atorvastatin - Continue home Imdur - Continue Metoprolol -PT/OT recommend SNF, patient amenable -Follow-up social work for SNF placement -COVID pending -Palliative consult inpatient versus at SNF  Acute on Chronic diastolic CHF: Echo with preserved ejection fraction, noted some diastolic dysfunction.  Patient not currently on diuretic.  Weight remains stable, 78.8-77.8 kg. - cardiology signed off  HTN: BP this AM 118/54 home meds: Imdur and HCTZ. Started on Metoprolol on 6/21 by [facility]. - cont imdur - cont metoprolol 12.5mg  BID  Odynophagia Patient notes chronic odynophagia.   -Follow-up SLP consult  Mild Hyponatremia Na 132>131. - cont to monitor  Hypokalemia K3.4 this a.m. -Add on mag -K-Dur 40 mEq x 1  Leukocytosis:  Resolved WBC 7.8>11.9>10.3.  Patient has remained afebrile and otherwise stable. -Continue to monitor   Seizures: Valproic acid level 51, WNL. Home meds: Depakote 500mg : 1 PO in AM, 2 PO in PM.  First seziure Dec 2019 shortly after fall when she hit her head.  Patient has not had any seizure-like activity while inpatient. -Continue Depakote at home dose - recommend outpatient follow up with neurology  Bipolar Disorder: - decrease fluoxetine to 20 mg, Seroquel 12.5 mg - keep Valproic acid as above  Anemia: Hemoglobin stable at 11.5.  History of GI bleed. No signs of bleeding on exam - continue to monitor  FEN/GI: Heart Healthy PPx: Lovenox  Disposition: Medically stable for discharge to SNF, spoke with social work, she has bed, waiting for insurance  Subjective:  No acute events overnight.  Patient without complaints this AM.    Objective: Temp:  [97.9 F (36.6 C)-98.6 F (37 C)] 97.9 F (36.6 C) (06/25 0438) Pulse Rate:  [58-69] 58 (06/25 0438) Resp:  [16-23] 23 (06/25 0438) BP: (118-140)/(42-55) 118/54 (06/25 0438) SpO2:  [94 %-99 %] 99 % (06/25 0438) Weight:  [77.8 kg] 77.8 kg (06/25 0438)   Physical Exam:  General: 85 y.o. female in NAD Cardio: RRR  Lungs: CTAB, no wheezing, no rhonchi, no crackles, no IWOB on RA Abdomen: Soft, non-tender to palpation, non-distended, positive bowel sounds Skin: warm and dry Extremities: Trace BLE pitting edema    Laboratory: Recent Labs  Lab 02/01/19 0407 02/03/19 0453 02/04/19 0346  WBC 7.8 11.9* 10.3  HGB 11.9* 11.8* 11.5*  HCT 35.5* 34.5* 33.3*  PLT 153 189 208   Recent Labs  Lab 01/30/19 1848  02/01/19 0407 02/03/19 0453 02/04/19 0346  NA 134*   < > 132* 132*  131*  K 3.1*   < > 3.7 3.7 3.4*  CL 100   < > 99 99 100  CO2 21*   < > 25 23 23   BUN 22   < > 16 21 23   CREATININE 0.58   < > 0.56 0.62 0.56  CALCIUM 9.0   < > 8.5* 8.6* 8.3*  PROT 5.9*  --   --   --   --   BILITOT 0.5  --   --   --   --   ALKPHOS  70  --   --   --   --   ALT 10  --   --   --   --   AST 20  --   --   --   --   GLUCOSE 112*   < > 124* 95 90   < > = values in this interval not displayed.   ECHO 01/31/2019 IMPRESSIONS  1. The left ventricle has hyperdynamic systolic function, with an ejection fraction of >65%. The cavity size was normal. Left ventricular diastolic Doppler parameters are consistent with impaired relaxation.  2. The right ventricle has normal systolic function. The cavity was normal. There is no increase in right ventricular wall thickness.  3. There is moderate mitral annular calcification present.  4. The aortic valve is tricuspid. Moderate thickening of the aortic valve. Moderate calcification of the aortic valve. Aortic valve regurgitation was not assessed by color flow Doppler.  Imaging/Diagnostic Tests: Dg Pelvis Portable  Result Date: 01/30/2019 CLINICAL DATA:  Weakness and decreased mobility. EXAM: PORTABLE PELVIS 1-2 VIEWS COMPARISON:  11/19/2012 radiographs FINDINGS: There is no evidence of pelvic fracture or diastasis. No pelvic bone lesions are seen. IMPRESSION: Negative. Electronically Signed   By: Margarette Canada M.D.   On: 01/30/2019 19:34   Dg Chest Portable 1 View  Result Date: 01/30/2019 CLINICAL DATA:  Weakness EXAM: PORTABLE CHEST 1 VIEW COMPARISON:  07/22/2018 and prior radiographs FINDINGS: The cardiomediastinal silhouette is unchanged with stable SUPERIOR mediastinal fullness. There is no evidence of focal airspace disease, pulmonary edema, suspicious pulmonary nodule/mass, pleural effusion, or pneumothorax. No acute bony abnormalities are identified. RIGHT shoulder arthroplasty changes again noted. IMPRESSION: No acute abnormality. Electronically Signed   By: Margarette Canada M.D.   On: 01/30/2019 19:33   Ren Aspinall, Bernita Raisin, DO 02/04/2019, 7:56 AM PGY-1, [place] Intern pager: [phone], text pages welcome

## 2019-02-04 NOTE — Care Management Important Message (Signed)
Important Message  Patient Details  Name: [name] MRN: [id number] Date of Birth: 08/11/1931   Medicare Important Message Given:  Yes     Shelda Altes 02/04/2019, 1:27 PM

## 2019-02-04 NOTE — Progress Notes (Signed)
Patient face timed/video chat with both daughters [name] and [name]. Simpson Paulos, Bettina Gavia RN

## 2019-02-05 DIAGNOSIS — R627 Adult failure to thrive: Secondary | ICD-10-CM

## 2019-02-08 DIAGNOSIS — I5033 Acute on chronic diastolic (congestive) heart failure: Secondary | ICD-10-CM | POA: Diagnosis not present

## 2019-02-08 DIAGNOSIS — W19XXXA Unspecified fall, initial encounter: Secondary | ICD-10-CM | POA: Diagnosis not present

## 2019-02-08 DIAGNOSIS — R627 Adult failure to thrive: Secondary | ICD-10-CM | POA: Diagnosis not present

## 2019-02-08 DIAGNOSIS — F0390 Unspecified dementia without behavioral disturbance: Secondary | ICD-10-CM | POA: Diagnosis not present

## 2019-02-08 DIAGNOSIS — M6281 Muscle weakness (generalized): Secondary | ICD-10-CM | POA: Diagnosis not present

## 2019-02-09 ENCOUNTER — Telehealth: Payer: Self-pay | Admitting: *Deleted

## 2019-02-09 DIAGNOSIS — R531 Weakness: Secondary | ICD-10-CM | POA: Diagnosis not present

## 2019-02-09 DIAGNOSIS — I2510 Atherosclerotic heart disease of native coronary artery without angina pectoris: Secondary | ICD-10-CM | POA: Diagnosis not present

## 2019-02-09 DIAGNOSIS — I5033 Acute on chronic diastolic (congestive) heart failure: Secondary | ICD-10-CM | POA: Diagnosis not present

## 2019-02-09 DIAGNOSIS — F3170 Bipolar disorder, currently in remission, most recent episode unspecified: Secondary | ICD-10-CM | POA: Diagnosis not present

## 2019-02-09 NOTE — Telephone Encounter (Signed)
A message was left, re: follow up visit. 

## 2019-02-13 LAB — NOVEL CORONAVIRUS, NAA (HOSP ORDER, SEND-OUT TO REF LAB; TAT 18-24 HRS)

## 2019-02-16 DIAGNOSIS — G9340 Encephalopathy, unspecified: Secondary | ICD-10-CM | POA: Diagnosis not present

## 2019-02-16 DIAGNOSIS — R531 Weakness: Secondary | ICD-10-CM | POA: Diagnosis not present

## 2019-02-16 DIAGNOSIS — R5381 Other malaise: Secondary | ICD-10-CM | POA: Diagnosis not present

## 2019-02-16 DIAGNOSIS — F05 Delirium due to known physiological condition: Secondary | ICD-10-CM | POA: Diagnosis not present

## 2019-02-16 DIAGNOSIS — M25511 Pain in right shoulder: Secondary | ICD-10-CM | POA: Diagnosis not present

## 2019-02-16 NOTE — Telephone Encounter (Signed)
A second message was left, re: follow up visit. 

## 2019-02-17 ENCOUNTER — Non-Acute Institutional Stay: Payer: Medicare HMO | Admitting: Adult Health Nurse Practitioner

## 2019-02-17 DIAGNOSIS — I5033 Acute on chronic diastolic (congestive) heart failure: Secondary | ICD-10-CM | POA: Diagnosis not present

## 2019-02-17 DIAGNOSIS — R0989 Other specified symptoms and signs involving the circulatory and respiratory systems: Secondary | ICD-10-CM | POA: Diagnosis not present

## 2019-02-17 DIAGNOSIS — Z515 Encounter for palliative care: Secondary | ICD-10-CM

## 2019-02-17 DIAGNOSIS — R600 Localized edema: Secondary | ICD-10-CM | POA: Diagnosis not present
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
## 2019-02-18 ENCOUNTER — Other Ambulatory Visit: Payer: Self-pay

## 2019-02-22 DIAGNOSIS — R52 Pain, unspecified: Secondary | ICD-10-CM | POA: Diagnosis not present

## 2019-02-22 DIAGNOSIS — R5381 Other malaise: Secondary | ICD-10-CM | POA: Diagnosis not present

## 2019-02-22 DIAGNOSIS — R600 Localized edema: Secondary | ICD-10-CM | POA: Diagnosis not present

## 2019-02-22 DIAGNOSIS — F332 Major depressive disorder, recurrent severe without psychotic features: Secondary | ICD-10-CM | POA: Diagnosis not present

## 2019-02-22 DIAGNOSIS — F419 Anxiety disorder, unspecified: Secondary | ICD-10-CM | POA: Diagnosis not present

## 2019-02-22 DIAGNOSIS — F319 Bipolar disorder, unspecified: Secondary | ICD-10-CM | POA: Diagnosis not present

## 2019-03-08 DIAGNOSIS — R600 Localized edema: Secondary | ICD-10-CM | POA: Diagnosis not present

## 2019-03-08 DIAGNOSIS — I110 Hypertensive heart disease with heart failure: Secondary | ICD-10-CM | POA: Diagnosis not present

## 2019-03-08 DIAGNOSIS — I5033 Acute on chronic diastolic (congestive) heart failure: Secondary | ICD-10-CM | POA: Diagnosis not present

## 2019-03-11 ENCOUNTER — Other Ambulatory Visit: Payer: Self-pay

## 2019-03-11 ENCOUNTER — Non-Acute Institutional Stay: Payer: Medicare HMO | Admitting: Adult Health Nurse Practitioner

## 2019-03-11 DIAGNOSIS — Z515 Encounter for palliative care: Secondary | ICD-10-CM

## 2019-03-11 NOTE — Progress Notes (Signed)
[facility] Consult Note Telephone: [phone]  Fax: [phone]  PATIENT NAME: [name] DOB: [birth year] MRN: [id number]  PRIMARY CARE PROVIDER:   [name], MD  REFERRING PROVIDER: Martinique Blattenberger NP  RESPONSIBLE PARTY:   [name], daughter and [name] [phone] (lives in [place])    [place] and PLAN:  1.  Acute on chronic CHF.  Patient continues to have increased edema but it does not seem anymore than last visit.  She is wearing TED hose today.  She does not like them but have encouraged her wear them daily to help with the edema.  Denies any increased SOB, cough, chest pain.   Lung sounds clear.  Continue to monitor for worsening symptoms of CHF and manage as needed  2.  Mobility.  Patient is able to walk short distances with walker but tires easily.  Staff reports that she is a one person assist with ADLs.  Though she does not have diagnosis of dementia, staff report that she will have days where she is more confused than others and it is hard to get her to do therapy on those days.    3.  Goals of care.  Patient is a DNR.  She still expresses wanting to be with her daughters in [place].  I spent 30 minutes providing this consultation,  from 10:00 to 10:30. More than 50% of the time in this consultation was spent coordinating communication.   HISTORY OF PRESENT ILLNESS:  [name] is a 85 y.o. year old female with multiple medical problems including acute on chronic CHF, CAD, HTN, bipolar, depression. Palliative Care was asked to help address goals of care.   CODE STATUS: DNR  PPS: 50% HOSPICE ELIGIBILITY/DIAGNOSIS: TBD  PHYSICAL EXAM:   General: patient sitting in wheelchair in NAD Cardiovascular: regular rate and rhythm Pulmonary: lung sounds clear; normal respiratory effort Abdomen: soft, nontender, + bowel sounds GU: no suprapubic tenderness Extremities: 1-2+ pitting edema  noted to bilateral feet and ankles; patient wearing TED hose Skin: no rashes Neurological: Weakness but otherwise nonfocal; A&O x3 but staff reports that she is becoming more confused  PAST MEDICAL HISTORY:  Past Medical History:  Diagnosis Date  . Anemia   . Asthma    once in 1950's  . AVM (arteriovenous malformation)   . Bipolar disorder ([place]) 10/13/2018  . Cancer ([facility])     right leg  skin  . Coronary artery disease   . Depression    sucide attempt in the distant past  . Excessive sweating   . GI bleed   . Hiatal hernia    s/p repair  . HLD (hyperlipidemia)   . HTN (hypertension)   . Hypertension   . Low back pain    ESI by Dr. Nelva Bush 2 weeks ago  . OA (osteoarthritis)    bilateral wrist, bilateral knees  . Obesity   . Obesity   . Ovarian cyst   . Right wrist injury    multiple surgeries and residual weakness.   . Rotator cuff arthropathy    right  . Seizures ([place]) 1980s   2/2 meningioma. none since cranioplasty  . Shortness of breath 02/02/2019  . Tuberculosis    + tb test      (father had)    SOCIAL HX:  Social History   Tobacco Use  . Smoking status: Never Smoker  . Smokeless tobacco: Never Used  Substance Use Topics  . Alcohol use: No  ALLERGIES:  Allergies  Allergen Reactions  . Amoxicillin Anaphylaxis  . Penicillins Anaphylaxis and Swelling    From childhood: Has patient had a PCN reaction causing immediate rash, facial/tongue/throat swelling, SOB or lightheadedness with hypotension: Yes Has patient had a PCN reaction causing severe rash involving mucus membranes or skin necrosis: Unk Has patient had a PCN reaction that required hospitalization: No Has patient had a PCN reaction occurring within the last 10 years: No If all of the above answers are "NO", then may proceed with Cephalosporin use. .  [name] [Levetiracetam]     Attributes worsening anxiety to keppra.  . Phenobarbital Other (See Comments)    Reaction not recalled by the patient  .  Phenytoin Other (See Comments)    "Makes me feel funny"   . Tape Rash    Please use paper tape     PERTINENT MEDICATIONS:  Outpatient Encounter Medications as of 03/11/2019  Medication Sig  . aspirin 81 MG EC tablet Take 1 tablet (81 mg total) by mouth daily.  Marland Kitchen atorvastatin (LIPITOR) 40 MG tablet Take 40 mg by mouth daily.   . calcium-vitamin D (OSCAL WITH D) 500-200 MG-UNIT tablet Take 1 tablet by mouth daily with breakfast.  . Coenzyme Q10 (COQ10) 200 MG CAPS Take 200 mg by mouth daily.   . divalproex (DEPAKOTE) 500 MG DR tablet Take 1 tablet in the morning, take 2 tablets in the evening  . FLUoxetine (PROZAC) 20 MG capsule Take 1 capsule (20 mg total) by mouth daily.  Marland Kitchen ibuprofen (ADVIL) 200 MG tablet Take 400 mg by mouth at bedtime.  . isosorbide mononitrate (IMDUR) 30 MG 24 hr tablet Take 0.5 tablets (15 mg total) by mouth daily.  . magnesium 30 MG tablet Take 30 mg by mouth daily.   . metoprolol tartrate (LOPRESSOR) 25 MG tablet Take 0.5 tablets (12.5 mg total) by mouth 2 (two) times daily.  . QUEtiapine (SEROQUEL) 25 MG tablet Take 0.5 tablets (12.5 mg total) by mouth at bedtime as needed (anxiety).   No facility-administered encounter medications on file as of 03/11/2019.       Shera Laubach Jenetta Downer, NP

## 2019-03-17 ENCOUNTER — Telehealth: Payer: Self-pay | Admitting: *Deleted

## 2019-03-17 DIAGNOSIS — Z20828 Contact with and (suspected) exposure to other viral communicable diseases: Secondary | ICD-10-CM | POA: Diagnosis not present

## 2019-03-17 NOTE — Telephone Encounter (Signed)
[name] is requesting a call from Dr. Andria Frames to discuss her moms condition. Christen Bame, CMA

## 2019-03-17 NOTE — Telephone Encounter (Signed)
I called [name].  She is dismayed that the nursing home where her mother has been for the last 1 month is demanding that she be discharged (because her Medicare has run out - still covered by Medicaid.)  [name] has dementia and needs 24 hour supervision.  Home sounds like a very poor option.  She continues to need nursing support.  No local facilities taking Medicaid.  The nursing home is not returning her calls.  The case is being appealed.  I did not know what to suggest.  Because of safety concerns in a vulnerable older adult, I suggested [name] contact adult protective services and see if they could play any beneficial role.

## 2019-03-18 DIAGNOSIS — I5033 Acute on chronic diastolic (congestive) heart failure: Secondary | ICD-10-CM | POA: Diagnosis not present

## 2019-03-18 DIAGNOSIS — R600 Localized edema: Secondary | ICD-10-CM | POA: Diagnosis not present

## 2019-03-18 DIAGNOSIS — I10 Essential (primary) hypertension: Secondary | ICD-10-CM | POA: Diagnosis not present

## 2019-03-26 DIAGNOSIS — I110 Hypertensive heart disease with heart failure: Secondary | ICD-10-CM | POA: Diagnosis not present

## 2019-03-26 DIAGNOSIS — F319 Bipolar disorder, unspecified: Secondary | ICD-10-CM | POA: Diagnosis not present

## 2019-03-26 DIAGNOSIS — R5381 Other malaise: Secondary | ICD-10-CM | POA: Diagnosis not present

## 2019-03-26 DIAGNOSIS — R600 Localized edema: Secondary | ICD-10-CM | POA: Diagnosis not present

## 2019-03-26 DIAGNOSIS — I5033 Acute on chronic diastolic (congestive) heart failure: Secondary | ICD-10-CM | POA: Diagnosis not present

## 2019-03-26 DIAGNOSIS — R627 Adult failure to thrive: Secondary | ICD-10-CM | POA: Diagnosis not present

## 2019-03-26 DIAGNOSIS — M6281 Muscle weakness (generalized): Secondary | ICD-10-CM | POA: Diagnosis not present

## 2019-03-29 DIAGNOSIS — Z139 Encounter for screening, unspecified: Secondary | ICD-10-CM | POA: Diagnosis not present

## 2019-03-29 DIAGNOSIS — D649 Anemia, unspecified: Secondary | ICD-10-CM | POA: Diagnosis not present

## 2019-03-29 DIAGNOSIS — R799 Abnormal finding of blood chemistry, unspecified: Secondary | ICD-10-CM | POA: Diagnosis not present

## 2019-03-29 DIAGNOSIS — Z79899 Other long term (current) drug therapy: Secondary | ICD-10-CM | POA: Diagnosis not present

## 2019-04-07 DIAGNOSIS — H9192 Unspecified hearing loss, left ear: Secondary | ICD-10-CM | POA: Diagnosis not present

## 2019-04-07 DIAGNOSIS — I5032 Chronic diastolic (congestive) heart failure: Secondary | ICD-10-CM | POA: Diagnosis not present

## 2019-04-07 DIAGNOSIS — I2510 Atherosclerotic heart disease of native coronary artery without angina pectoris: Secondary | ICD-10-CM | POA: Diagnosis not present

## 2019-04-07 DIAGNOSIS — F319 Bipolar disorder, unspecified: Secondary | ICD-10-CM | POA: Diagnosis not present

## 2019-04-07 DIAGNOSIS — M6281 Muscle weakness (generalized): Secondary | ICD-10-CM | POA: Diagnosis not present

## 2019-04-07 DIAGNOSIS — I10 Essential (primary) hypertension: Secondary | ICD-10-CM | POA: Diagnosis not present

## 2019-04-07 DIAGNOSIS — F331 Major depressive disorder, recurrent, moderate: Secondary | ICD-10-CM | POA: Diagnosis not present

## 2019-04-07 DIAGNOSIS — E785 Hyperlipidemia, unspecified: Secondary | ICD-10-CM | POA: Diagnosis not present

## 2019-04-09 DIAGNOSIS — E039 Hypothyroidism, unspecified: Secondary | ICD-10-CM | POA: Diagnosis not present

## 2019-04-09 DIAGNOSIS — D649 Anemia, unspecified: Secondary | ICD-10-CM | POA: Diagnosis not present

## 2019-04-09 DIAGNOSIS — E559 Vitamin D deficiency, unspecified: Secondary | ICD-10-CM | POA: Diagnosis not present

## 2019-04-09 DIAGNOSIS — Z139 Encounter for screening, unspecified: Secondary | ICD-10-CM | POA: Diagnosis not present

## 2019-04-09 DIAGNOSIS — Z79899 Other long term (current) drug therapy: Secondary | ICD-10-CM | POA: Diagnosis not present

## 2019-04-09 DIAGNOSIS — R799 Abnormal finding of blood chemistry, unspecified: Secondary | ICD-10-CM | POA: Diagnosis not present

## 2019-04-09 DIAGNOSIS — D519 Vitamin B12 deficiency anemia, unspecified: Secondary | ICD-10-CM | POA: Diagnosis not present

## 2019-04-09 DIAGNOSIS — E785 Hyperlipidemia, unspecified: Secondary | ICD-10-CM | POA: Diagnosis not present

## 2019-04-12 DIAGNOSIS — M6281 Muscle weakness (generalized): Secondary | ICD-10-CM | POA: Diagnosis not present

## 2019-04-12 DIAGNOSIS — I5033 Acute on chronic diastolic (congestive) heart failure: Secondary | ICD-10-CM | POA: Diagnosis not present

## 2019-04-13 DIAGNOSIS — I5032 Chronic diastolic (congestive) heart failure: Secondary | ICD-10-CM | POA: Diagnosis not present

## 2019-04-13 DIAGNOSIS — M6281 Muscle weakness (generalized): Secondary | ICD-10-CM | POA: Diagnosis not present

## 2019-04-13 DIAGNOSIS — R5381 Other malaise: Secondary | ICD-10-CM | POA: Diagnosis not present

## 2019-04-13 DIAGNOSIS — F319 Bipolar disorder, unspecified: Secondary | ICD-10-CM | POA: Diagnosis not present

## 2019-04-13 DIAGNOSIS — F331 Major depressive disorder, recurrent, moderate: Secondary | ICD-10-CM | POA: Diagnosis not present

## 2019-04-14 DIAGNOSIS — I5033 Acute on chronic diastolic (congestive) heart failure: Secondary | ICD-10-CM | POA: Diagnosis not present

## 2019-04-14 DIAGNOSIS — E876 Hypokalemia: Secondary | ICD-10-CM | POA: Diagnosis not present

## 2019-04-14 DIAGNOSIS — I2510 Atherosclerotic heart disease of native coronary artery without angina pectoris: Secondary | ICD-10-CM | POA: Diagnosis not present

## 2019-04-14 DIAGNOSIS — I10 Essential (primary) hypertension: Secondary | ICD-10-CM | POA: Diagnosis not present

## 2019-04-15 DIAGNOSIS — I5033 Acute on chronic diastolic (congestive) heart failure: Secondary | ICD-10-CM | POA: Diagnosis not present

## 2019-04-15 DIAGNOSIS — M6281 Muscle weakness (generalized): Secondary | ICD-10-CM | POA: Diagnosis not present

## 2019-04-16 DIAGNOSIS — I5033 Acute on chronic diastolic (congestive) heart failure: Secondary | ICD-10-CM | POA: Diagnosis not present

## 2019-04-16 DIAGNOSIS — M6281 Muscle weakness (generalized): Secondary | ICD-10-CM | POA: Diagnosis not present

## 2019-04-16 DIAGNOSIS — Z139 Encounter for screening, unspecified: Secondary | ICD-10-CM | POA: Diagnosis not present

## 2019-04-16 DIAGNOSIS — Z79899 Other long term (current) drug therapy: Secondary | ICD-10-CM | POA: Diagnosis not present

## 2019-04-19 DIAGNOSIS — M6281 Muscle weakness (generalized): Secondary | ICD-10-CM | POA: Diagnosis not present

## 2019-04-19 DIAGNOSIS — I5033 Acute on chronic diastolic (congestive) heart failure: Secondary | ICD-10-CM | POA: Diagnosis not present

## 2019-04-20 DIAGNOSIS — I5033 Acute on chronic diastolic (congestive) heart failure: Secondary | ICD-10-CM | POA: Diagnosis not present

## 2019-04-20 DIAGNOSIS — M6281 Muscle weakness (generalized): Secondary | ICD-10-CM | POA: Diagnosis not present

## 2019-04-21 DIAGNOSIS — I5033 Acute on chronic diastolic (congestive) heart failure: Secondary | ICD-10-CM | POA: Diagnosis not present

## 2019-04-21 DIAGNOSIS — M6281 Muscle weakness (generalized): Secondary | ICD-10-CM | POA: Diagnosis not present

## 2019-04-23 DIAGNOSIS — M6281 Muscle weakness (generalized): Secondary | ICD-10-CM | POA: Diagnosis not present

## 2019-04-23 DIAGNOSIS — I5033 Acute on chronic diastolic (congestive) heart failure: Secondary | ICD-10-CM | POA: Diagnosis not present

## 2019-04-24 DIAGNOSIS — I5033 Acute on chronic diastolic (congestive) heart failure: Secondary | ICD-10-CM | POA: Diagnosis not present

## 2019-04-24 DIAGNOSIS — M6281 Muscle weakness (generalized): Secondary | ICD-10-CM | POA: Diagnosis not present

## 2019-04-25 DIAGNOSIS — M6281 Muscle weakness (generalized): Secondary | ICD-10-CM | POA: Diagnosis not present

## 2019-04-25 DIAGNOSIS — I5033 Acute on chronic diastolic (congestive) heart failure: Secondary | ICD-10-CM | POA: Diagnosis not present

## 2019-04-26 DIAGNOSIS — I5033 Acute on chronic diastolic (congestive) heart failure: Secondary | ICD-10-CM | POA: Diagnosis not present

## 2019-04-26 DIAGNOSIS — F319 Bipolar disorder, unspecified: Secondary | ICD-10-CM | POA: Diagnosis not present

## 2019-04-26 DIAGNOSIS — I2510 Atherosclerotic heart disease of native coronary artery without angina pectoris: Secondary | ICD-10-CM | POA: Diagnosis not present

## 2019-04-26 DIAGNOSIS — M6281 Muscle weakness (generalized): Secondary | ICD-10-CM | POA: Diagnosis not present

## 2019-04-26 DIAGNOSIS — R600 Localized edema: Secondary | ICD-10-CM | POA: Diagnosis not present

## 2019-04-26 DIAGNOSIS — I10 Essential (primary) hypertension: Secondary | ICD-10-CM | POA: Diagnosis not present

## 2019-04-28 DIAGNOSIS — I2510 Atherosclerotic heart disease of native coronary artery without angina pectoris: Secondary | ICD-10-CM | POA: Diagnosis not present

## 2019-04-28 DIAGNOSIS — I5023 Acute on chronic systolic (congestive) heart failure: Secondary | ICD-10-CM | POA: Diagnosis not present

## 2019-04-29 ENCOUNTER — Telehealth: Payer: Self-pay | Admitting: *Deleted

## 2019-04-29 DIAGNOSIS — I10 Essential (primary) hypertension: Secondary | ICD-10-CM | POA: Diagnosis not present

## 2019-04-29 DIAGNOSIS — R071 Chest pain on breathing: Secondary | ICD-10-CM | POA: Diagnosis not present

## 2019-04-29 DIAGNOSIS — I2510 Atherosclerotic heart disease of native coronary artery without angina pectoris: Secondary | ICD-10-CM | POA: Diagnosis not present

## 2019-04-29 DIAGNOSIS — I5023 Acute on chronic systolic (congestive) heart failure: Secondary | ICD-10-CM | POA: Diagnosis not present

## 2019-04-29 DIAGNOSIS — D649 Anemia, unspecified: Secondary | ICD-10-CM | POA: Diagnosis not present

## 2019-04-29 NOTE — Telephone Encounter (Signed)
Daughter calls to let PCP know that pt is now in [place] with her in a nursing home.  She does have some questions about her records pertaining to a history of TB.  She would like a call back at your earliest convenience. Christen Bame, CMA

## 2019-04-30 DIAGNOSIS — I2510 Atherosclerotic heart disease of native coronary artery without angina pectoris: Secondary | ICD-10-CM | POA: Diagnosis not present

## 2019-04-30 DIAGNOSIS — Z20828 Contact with and (suspected) exposure to other viral communicable diseases: Secondary | ICD-10-CM | POA: Diagnosis not present

## 2019-04-30 DIAGNOSIS — I5023 Acute on chronic systolic (congestive) heart failure: Secondary | ICD-10-CM | POA: Diagnosis not present

## 2019-05-03 DIAGNOSIS — I2510 Atherosclerotic heart disease of native coronary artery without angina pectoris: Secondary | ICD-10-CM | POA: Diagnosis not present

## 2019-05-03 DIAGNOSIS — I5023 Acute on chronic systolic (congestive) heart failure: Secondary | ICD-10-CM | POA: Diagnosis not present

## 2019-05-04 DIAGNOSIS — I2510 Atherosclerotic heart disease of native coronary artery without angina pectoris: Secondary | ICD-10-CM | POA: Diagnosis not present

## 2019-05-04 DIAGNOSIS — I5023 Acute on chronic systolic (congestive) heart failure: Secondary | ICD-10-CM | POA: Diagnosis not present

## 2019-05-05 ENCOUNTER — Encounter: Payer: Self-pay | Admitting: Neurology

## 2019-05-05 ENCOUNTER — Telehealth: Payer: Self-pay | Admitting: Neurology

## 2019-05-05 DIAGNOSIS — I5023 Acute on chronic systolic (congestive) heart failure: Secondary | ICD-10-CM | POA: Diagnosis not present

## 2019-05-05 DIAGNOSIS — I2510 Atherosclerotic heart disease of native coronary artery without angina pectoris: Secondary | ICD-10-CM | POA: Diagnosis not present

## 2019-05-05 NOTE — Telephone Encounter (Signed)
I called patient and LVM to reschedule 11/9 appt due to provider schedule change. Beginning 11/9 Dr. Jannifer Franklin will only see NCV/EMG the afternoons of Mon, Tues, Wednesday the weeks he will be here. Requested patient call back to reschedule.

## 2019-05-06 DIAGNOSIS — I2510 Atherosclerotic heart disease of native coronary artery without angina pectoris: Secondary | ICD-10-CM | POA: Diagnosis not present

## 2019-05-06 DIAGNOSIS — I5023 Acute on chronic systolic (congestive) heart failure: Secondary | ICD-10-CM | POA: Diagnosis not present

## 2019-05-07 DIAGNOSIS — I2510 Atherosclerotic heart disease of native coronary artery without angina pectoris: Secondary | ICD-10-CM | POA: Diagnosis not present

## 2019-05-07 DIAGNOSIS — I5023 Acute on chronic systolic (congestive) heart failure: Secondary | ICD-10-CM | POA: Diagnosis not present

## 2019-05-10 DIAGNOSIS — I5023 Acute on chronic systolic (congestive) heart failure: Secondary | ICD-10-CM | POA: Diagnosis not present

## 2019-05-10 DIAGNOSIS — I2510 Atherosclerotic heart disease of native coronary artery without angina pectoris: Secondary | ICD-10-CM | POA: Diagnosis not present

## 2019-05-11 DIAGNOSIS — I5023 Acute on chronic systolic (congestive) heart failure: Secondary | ICD-10-CM | POA: Diagnosis not present

## 2019-05-11 DIAGNOSIS — I2510 Atherosclerotic heart disease of native coronary artery without angina pectoris: Secondary | ICD-10-CM | POA: Diagnosis not present

## 2019-05-12 DIAGNOSIS — I2510 Atherosclerotic heart disease of native coronary artery without angina pectoris: Secondary | ICD-10-CM | POA: Diagnosis not present

## 2019-05-12 DIAGNOSIS — Z20828 Contact with and (suspected) exposure to other viral communicable diseases: Secondary | ICD-10-CM | POA: Diagnosis not present

## 2019-05-12 DIAGNOSIS — I5023 Acute on chronic systolic (congestive) heart failure: Secondary | ICD-10-CM | POA: Diagnosis not present

## 2019-05-13 DIAGNOSIS — I2510 Atherosclerotic heart disease of native coronary artery without angina pectoris: Secondary | ICD-10-CM | POA: Diagnosis not present

## 2019-05-13 DIAGNOSIS — I5023 Acute on chronic systolic (congestive) heart failure: Secondary | ICD-10-CM | POA: Diagnosis not present

## 2019-05-13 NOTE — Telephone Encounter (Signed)
Pt no longer lives in state. Has moved to [place].

## 2019-05-14 DIAGNOSIS — I5023 Acute on chronic systolic (congestive) heart failure: Secondary | ICD-10-CM | POA: Diagnosis not present

## 2019-05-14 DIAGNOSIS — I2510 Atherosclerotic heart disease of native coronary artery without angina pectoris: Secondary | ICD-10-CM | POA: Diagnosis not present

## 2019-05-17 DIAGNOSIS — Z20828 Contact with and (suspected) exposure to other viral communicable diseases: Secondary | ICD-10-CM | POA: Diagnosis not present

## 2019-05-17 DIAGNOSIS — I2510 Atherosclerotic heart disease of native coronary artery without angina pectoris: Secondary | ICD-10-CM | POA: Diagnosis not present

## 2019-05-17 DIAGNOSIS — I5023 Acute on chronic systolic (congestive) heart failure: Secondary | ICD-10-CM | POA: Diagnosis not present

## 2019-05-18 DIAGNOSIS — I2510 Atherosclerotic heart disease of native coronary artery without angina pectoris: Secondary | ICD-10-CM | POA: Diagnosis not present

## 2019-05-18 DIAGNOSIS — I5023 Acute on chronic systolic (congestive) heart failure: Secondary | ICD-10-CM | POA: Diagnosis not present

## 2019-05-18 DIAGNOSIS — Z20828 Contact with and (suspected) exposure to other viral communicable diseases: Secondary | ICD-10-CM | POA: Diagnosis not present

## 2019-05-19 DIAGNOSIS — I5023 Acute on chronic systolic (congestive) heart failure: Secondary | ICD-10-CM | POA: Diagnosis not present

## 2019-05-19 DIAGNOSIS — I2510 Atherosclerotic heart disease of native coronary artery without angina pectoris: Secondary | ICD-10-CM | POA: Diagnosis not present

## 2019-05-20 DIAGNOSIS — I2510 Atherosclerotic heart disease of native coronary artery without angina pectoris: Secondary | ICD-10-CM | POA: Diagnosis not present

## 2019-05-20 DIAGNOSIS — I5023 Acute on chronic systolic (congestive) heart failure: Secondary | ICD-10-CM | POA: Diagnosis not present

## 2019-05-21 DIAGNOSIS — I2510 Atherosclerotic heart disease of native coronary artery without angina pectoris: Secondary | ICD-10-CM | POA: Diagnosis not present

## 2019-05-21 DIAGNOSIS — I5023 Acute on chronic systolic (congestive) heart failure: Secondary | ICD-10-CM | POA: Diagnosis not present

## 2019-05-24 DIAGNOSIS — I2510 Atherosclerotic heart disease of native coronary artery without angina pectoris: Secondary | ICD-10-CM | POA: Diagnosis not present

## 2019-05-24 DIAGNOSIS — I5023 Acute on chronic systolic (congestive) heart failure: Secondary | ICD-10-CM | POA: Diagnosis not present

## 2019-05-25 DIAGNOSIS — I5023 Acute on chronic systolic (congestive) heart failure: Secondary | ICD-10-CM | POA: Diagnosis not present

## 2019-05-25 DIAGNOSIS — I2510 Atherosclerotic heart disease of native coronary artery without angina pectoris: Secondary | ICD-10-CM | POA: Diagnosis not present

## 2019-05-26 DIAGNOSIS — I2510 Atherosclerotic heart disease of native coronary artery without angina pectoris: Secondary | ICD-10-CM | POA: Diagnosis not present

## 2019-05-26 DIAGNOSIS — Z23 Encounter for immunization: Secondary | ICD-10-CM | POA: Diagnosis not present

## 2019-05-26 DIAGNOSIS — I5023 Acute on chronic systolic (congestive) heart failure: Secondary | ICD-10-CM | POA: Diagnosis not present

## 2019-05-27 DIAGNOSIS — I2510 Atherosclerotic heart disease of native coronary artery without angina pectoris: Secondary | ICD-10-CM | POA: Diagnosis not present

## 2019-05-27 DIAGNOSIS — I5023 Acute on chronic systolic (congestive) heart failure: Secondary | ICD-10-CM | POA: Diagnosis not present

## 2019-05-28 DIAGNOSIS — I5023 Acute on chronic systolic (congestive) heart failure: Secondary | ICD-10-CM | POA: Diagnosis not present

## 2019-05-28 DIAGNOSIS — I2510 Atherosclerotic heart disease of native coronary artery without angina pectoris: Secondary | ICD-10-CM | POA: Diagnosis not present

## 2019-05-31 DIAGNOSIS — I5023 Acute on chronic systolic (congestive) heart failure: Secondary | ICD-10-CM | POA: Diagnosis not present

## 2019-05-31 DIAGNOSIS — I2510 Atherosclerotic heart disease of native coronary artery without angina pectoris: Secondary | ICD-10-CM | POA: Diagnosis not present

## 2019-06-01 DIAGNOSIS — I5023 Acute on chronic systolic (congestive) heart failure: Secondary | ICD-10-CM | POA: Diagnosis not present

## 2019-06-01 DIAGNOSIS — I2510 Atherosclerotic heart disease of native coronary artery without angina pectoris: Secondary | ICD-10-CM | POA: Diagnosis not present

## 2019-06-02 DIAGNOSIS — I5023 Acute on chronic systolic (congestive) heart failure: Secondary | ICD-10-CM | POA: Diagnosis not present

## 2019-06-02 DIAGNOSIS — I2510 Atherosclerotic heart disease of native coronary artery without angina pectoris: Secondary | ICD-10-CM | POA: Diagnosis not present

## 2019-06-03 DIAGNOSIS — I2510 Atherosclerotic heart disease of native coronary artery without angina pectoris: Secondary | ICD-10-CM | POA: Diagnosis not present

## 2019-06-03 DIAGNOSIS — I5023 Acute on chronic systolic (congestive) heart failure: Secondary | ICD-10-CM | POA: Diagnosis not present

## 2019-06-04 DIAGNOSIS — I2510 Atherosclerotic heart disease of native coronary artery without angina pectoris: Secondary | ICD-10-CM | POA: Diagnosis not present

## 2019-06-04 DIAGNOSIS — I5023 Acute on chronic systolic (congestive) heart failure: Secondary | ICD-10-CM | POA: Diagnosis not present

## 2019-06-07 DIAGNOSIS — I2510 Atherosclerotic heart disease of native coronary artery without angina pectoris: Secondary | ICD-10-CM | POA: Diagnosis not present

## 2019-06-07 DIAGNOSIS — E785 Hyperlipidemia, unspecified: Secondary | ICD-10-CM | POA: Diagnosis not present

## 2019-06-07 DIAGNOSIS — I10 Essential (primary) hypertension: Secondary | ICD-10-CM | POA: Diagnosis not present

## 2019-06-07 DIAGNOSIS — I5023 Acute on chronic systolic (congestive) heart failure: Secondary | ICD-10-CM | POA: Diagnosis not present

## 2019-06-08 DIAGNOSIS — I2510 Atherosclerotic heart disease of native coronary artery without angina pectoris: Secondary | ICD-10-CM | POA: Diagnosis not present

## 2019-06-08 DIAGNOSIS — I5023 Acute on chronic systolic (congestive) heart failure: Secondary | ICD-10-CM | POA: Diagnosis not present

## 2019-06-09 DIAGNOSIS — I2510 Atherosclerotic heart disease of native coronary artery without angina pectoris: Secondary | ICD-10-CM | POA: Diagnosis not present

## 2019-06-09 DIAGNOSIS — I5023 Acute on chronic systolic (congestive) heart failure: Secondary | ICD-10-CM | POA: Diagnosis not present

## 2019-06-10 DIAGNOSIS — I2510 Atherosclerotic heart disease of native coronary artery without angina pectoris: Secondary | ICD-10-CM | POA: Diagnosis not present

## 2019-06-10 DIAGNOSIS — I5023 Acute on chronic systolic (congestive) heart failure: Secondary | ICD-10-CM | POA: Diagnosis not present

## 2019-06-11 DIAGNOSIS — I5023 Acute on chronic systolic (congestive) heart failure: Secondary | ICD-10-CM | POA: Diagnosis not present

## 2019-06-11 DIAGNOSIS — I2510 Atherosclerotic heart disease of native coronary artery without angina pectoris: Secondary | ICD-10-CM | POA: Diagnosis not present

## 2019-06-14 DIAGNOSIS — I2510 Atherosclerotic heart disease of native coronary artery without angina pectoris: Secondary | ICD-10-CM | POA: Diagnosis not present

## 2019-06-14 DIAGNOSIS — I5023 Acute on chronic systolic (congestive) heart failure: Secondary | ICD-10-CM | POA: Diagnosis not present

## 2019-06-14 DIAGNOSIS — Z20828 Contact with and (suspected) exposure to other viral communicable diseases: Secondary | ICD-10-CM | POA: Diagnosis not present

## 2019-06-15 DIAGNOSIS — I5023 Acute on chronic systolic (congestive) heart failure: Secondary | ICD-10-CM | POA: Diagnosis not present

## 2019-06-15 DIAGNOSIS — I2510 Atherosclerotic heart disease of native coronary artery without angina pectoris: Secondary | ICD-10-CM | POA: Diagnosis not present

## 2019-06-16 DIAGNOSIS — I5023 Acute on chronic systolic (congestive) heart failure: Secondary | ICD-10-CM | POA: Diagnosis not present

## 2019-06-16 DIAGNOSIS — I2510 Atherosclerotic heart disease of native coronary artery without angina pectoris: Secondary | ICD-10-CM | POA: Diagnosis not present

## 2019-06-17 DIAGNOSIS — Z20828 Contact with and (suspected) exposure to other viral communicable diseases: Secondary | ICD-10-CM | POA: Diagnosis not present

## 2019-06-21 ENCOUNTER — Ambulatory Visit: Payer: Medicare HMO | Admitting: Neurology

## 2019-06-21 DIAGNOSIS — Z20828 Contact with and (suspected) exposure to other viral communicable diseases: Secondary | ICD-10-CM | POA: Diagnosis not present

## 2019-07-05 DIAGNOSIS — Z20828 Contact with and (suspected) exposure to other viral communicable diseases: Secondary | ICD-10-CM | POA: Diagnosis not present

## 2019-07-12 DIAGNOSIS — Z20828 Contact with and (suspected) exposure to other viral communicable diseases: Secondary | ICD-10-CM | POA: Diagnosis not present

## 2019-07-14 DIAGNOSIS — Z20828 Contact with and (suspected) exposure to other viral communicable diseases: Secondary | ICD-10-CM | POA: Diagnosis not present

## 2019-07-19 DIAGNOSIS — Z20828 Contact with and (suspected) exposure to other viral communicable diseases: Secondary | ICD-10-CM | POA: Diagnosis not present

## 2019-07-21 DIAGNOSIS — Z20828 Contact with and (suspected) exposure to other viral communicable diseases: Secondary | ICD-10-CM | POA: Diagnosis not present

## 2019-07-26 DIAGNOSIS — Z20828 Contact with and (suspected) exposure to other viral communicable diseases: Secondary | ICD-10-CM | POA: Diagnosis not present

## 2019-07-28 DIAGNOSIS — Z20828 Contact with and (suspected) exposure to other viral communicable diseases: Secondary | ICD-10-CM | POA: Diagnosis not present

## 2019-08-02 DIAGNOSIS — Z20828 Contact with and (suspected) exposure to other viral communicable diseases: Secondary | ICD-10-CM | POA: Diagnosis not present

## 2019-08-04 DIAGNOSIS — Z20828 Contact with and (suspected) exposure to other viral communicable diseases: Secondary | ICD-10-CM | POA: Diagnosis not present

## 2019-08-09 DIAGNOSIS — Z20828 Contact with and (suspected) exposure to other viral communicable diseases: Secondary | ICD-10-CM | POA: Diagnosis not present

## 2019-08-11 DIAGNOSIS — Z20828 Contact with and (suspected) exposure to other viral communicable diseases: Secondary | ICD-10-CM | POA: Diagnosis not present

## 2019-08-11 DIAGNOSIS — I5023 Acute on chronic systolic (congestive) heart failure: Secondary | ICD-10-CM | POA: Diagnosis not present

## 2019-08-16 DIAGNOSIS — Z20822 Contact with and (suspected) exposure to covid-19: Secondary | ICD-10-CM | POA: Diagnosis not present

## 2019-08-16 DIAGNOSIS — Z23 Encounter for immunization: Secondary | ICD-10-CM | POA: Diagnosis not present

## 2019-08-18 DIAGNOSIS — Z20822 Contact with and (suspected) exposure to covid-19: Secondary | ICD-10-CM | POA: Diagnosis not present

## 2019-08-23 DIAGNOSIS — Z20822 Contact with and (suspected) exposure to covid-19: Secondary | ICD-10-CM | POA: Diagnosis not present

## 2019-08-25 DIAGNOSIS — Z20822 Contact with and (suspected) exposure to covid-19: Secondary | ICD-10-CM | POA: Diagnosis not present

## 2019-08-27 DIAGNOSIS — R071 Chest pain on breathing: Secondary | ICD-10-CM | POA: Diagnosis not present

## 2019-08-30 DIAGNOSIS — Z20822 Contact with and (suspected) exposure to covid-19: Secondary | ICD-10-CM | POA: Diagnosis not present

## 2019-09-01 DIAGNOSIS — Z20822 Contact with and (suspected) exposure to covid-19: Secondary | ICD-10-CM | POA: Diagnosis not present

## 2019-09-06 DIAGNOSIS — Z23 Encounter for immunization: Secondary | ICD-10-CM | POA: Diagnosis not present

## 2019-09-06 DIAGNOSIS — Z20822 Contact with and (suspected) exposure to covid-19: Secondary | ICD-10-CM | POA: Diagnosis not present

## 2019-09-08 DIAGNOSIS — Z20822 Contact with and (suspected) exposure to covid-19: Secondary | ICD-10-CM | POA: Diagnosis not present

## 2019-09-13 DIAGNOSIS — Z20822 Contact with and (suspected) exposure to covid-19: Secondary | ICD-10-CM | POA: Diagnosis not present

## 2019-09-15 DIAGNOSIS — Z20822 Contact with and (suspected) exposure to covid-19: Secondary | ICD-10-CM | POA: Diagnosis not present

## 2019-09-20 DIAGNOSIS — F0390 Unspecified dementia without behavioral disturbance: Secondary | ICD-10-CM | POA: Diagnosis not present

## 2019-09-20 DIAGNOSIS — B9620 Unspecified Escherichia coli [E. coli] as the cause of diseases classified elsewhere: Secondary | ICD-10-CM | POA: Diagnosis not present

## 2019-09-24 ENCOUNTER — Encounter: Payer: Self-pay | Admitting: General Practice

## 2019-10-08 DIAGNOSIS — I5023 Acute on chronic systolic (congestive) heart failure: Secondary | ICD-10-CM | POA: Diagnosis not present

## 2019-10-12 DIAGNOSIS — I5023 Acute on chronic systolic (congestive) heart failure: Secondary | ICD-10-CM | POA: Diagnosis not present

## 2019-10-15 DIAGNOSIS — Z20822 Contact with and (suspected) exposure to covid-19: Secondary | ICD-10-CM | POA: Diagnosis not present

## 2019-10-18 DIAGNOSIS — Z20822 Contact with and (suspected) exposure to covid-19: Secondary | ICD-10-CM | POA: Diagnosis not present

## 2019-10-20 DIAGNOSIS — Z20822 Contact with and (suspected) exposure to covid-19: Secondary | ICD-10-CM | POA: Diagnosis not present

## 2019-10-25 DIAGNOSIS — Z20822 Contact with and (suspected) exposure to covid-19: Secondary | ICD-10-CM | POA: Diagnosis not present

## 2019-10-27 DIAGNOSIS — Z20822 Contact with and (suspected) exposure to covid-19: Secondary | ICD-10-CM | POA: Diagnosis not present

## 2019-11-12 DIAGNOSIS — I5023 Acute on chronic systolic (congestive) heart failure: Secondary | ICD-10-CM | POA: Diagnosis not present

## 2019-11-22 DIAGNOSIS — G40919 Epilepsy, unspecified, intractable, without status epilepticus: Secondary | ICD-10-CM | POA: Diagnosis not present

## 2019-12-09 DIAGNOSIS — H02135 Senile ectropion of left lower eyelid: Secondary | ICD-10-CM | POA: Diagnosis not present

## 2019-12-09 DIAGNOSIS — H524 Presbyopia: Secondary | ICD-10-CM | POA: Diagnosis not present

## 2019-12-09 DIAGNOSIS — H5203 Hypermetropia, bilateral: Secondary | ICD-10-CM | POA: Diagnosis not present

## 2019-12-09 DIAGNOSIS — Z961 Presence of intraocular lens: Secondary | ICD-10-CM | POA: Diagnosis not present

## 2019-12-09 DIAGNOSIS — H04222 Epiphora due to insufficient drainage, left lacrimal gland: Secondary | ICD-10-CM | POA: Diagnosis not present

## 2019-12-20 DIAGNOSIS — I5023 Acute on chronic systolic (congestive) heart failure: Secondary | ICD-10-CM | POA: Diagnosis not present

## 2020-01-14 DIAGNOSIS — I5023 Acute on chronic systolic (congestive) heart failure: Secondary | ICD-10-CM | POA: Diagnosis not present

## 2020-02-11 DIAGNOSIS — I5023 Acute on chronic systolic (congestive) heart failure: Secondary | ICD-10-CM | POA: Diagnosis not present

## 2020-02-16 DIAGNOSIS — Z20822 Contact with and (suspected) exposure to covid-19: Secondary | ICD-10-CM | POA: Diagnosis not present

## 2020-02-21 DIAGNOSIS — Z20822 Contact with and (suspected) exposure to covid-19: Secondary | ICD-10-CM | POA: Diagnosis not present

## 2020-02-28 DIAGNOSIS — Z20822 Contact with and (suspected) exposure to covid-19: Secondary | ICD-10-CM | POA: Diagnosis not present

## 2020-03-22 DIAGNOSIS — Z20822 Contact with and (suspected) exposure to covid-19: Secondary | ICD-10-CM | POA: Diagnosis not present

## 2020-03-24 DIAGNOSIS — I5023 Acute on chronic systolic (congestive) heart failure: Secondary | ICD-10-CM | POA: Diagnosis not present

## 2020-03-27 DIAGNOSIS — Z20822 Contact with and (suspected) exposure to covid-19: Secondary | ICD-10-CM | POA: Diagnosis not present

## 2020-04-03 DIAGNOSIS — Z20822 Contact with and (suspected) exposure to covid-19: Secondary | ICD-10-CM | POA: Diagnosis not present

## 2020-04-06 DIAGNOSIS — Z20822 Contact with and (suspected) exposure to covid-19: Secondary | ICD-10-CM | POA: Diagnosis not present

## 2020-04-10 DIAGNOSIS — Z20822 Contact with and (suspected) exposure to covid-19: Secondary | ICD-10-CM | POA: Diagnosis not present

## 2020-04-13 DIAGNOSIS — Z20822 Contact with and (suspected) exposure to covid-19: Secondary | ICD-10-CM | POA: Diagnosis not present

## 2020-04-18 DIAGNOSIS — Z20822 Contact with and (suspected) exposure to covid-19: Secondary | ICD-10-CM | POA: Diagnosis not present

## 2020-04-20 DIAGNOSIS — Z20822 Contact with and (suspected) exposure to covid-19: Secondary | ICD-10-CM | POA: Diagnosis not present

## 2020-04-24 DIAGNOSIS — Z20822 Contact with and (suspected) exposure to covid-19: Secondary | ICD-10-CM | POA: Diagnosis not present

## 2020-04-27 DIAGNOSIS — Z20822 Contact with and (suspected) exposure to covid-19: Secondary | ICD-10-CM | POA: Diagnosis not present

## 2020-05-01 DIAGNOSIS — Z20822 Contact with and (suspected) exposure to covid-19: Secondary | ICD-10-CM | POA: Diagnosis not present

## 2020-05-08 DIAGNOSIS — Z20822 Contact with and (suspected) exposure to covid-19: Secondary | ICD-10-CM | POA: Diagnosis not present

## 2020-05-11 DIAGNOSIS — Z20822 Contact with and (suspected) exposure to covid-19: Secondary | ICD-10-CM | POA: Diagnosis not present

## 2020-05-15 DIAGNOSIS — D649 Anemia, unspecified: Secondary | ICD-10-CM | POA: Diagnosis not present

## 2020-05-15 DIAGNOSIS — Z20822 Contact with and (suspected) exposure to covid-19: Secondary | ICD-10-CM | POA: Diagnosis not present

## 2020-05-15 DIAGNOSIS — R569 Unspecified convulsions: Secondary | ICD-10-CM | POA: Diagnosis not present

## 2020-05-17 DIAGNOSIS — I5023 Acute on chronic systolic (congestive) heart failure: Secondary | ICD-10-CM | POA: Diagnosis not present

## 2020-05-18 DIAGNOSIS — Z20822 Contact with and (suspected) exposure to covid-19: Secondary | ICD-10-CM | POA: Diagnosis not present

## 2020-05-22 DIAGNOSIS — Z20822 Contact with and (suspected) exposure to covid-19: Secondary | ICD-10-CM | POA: Diagnosis not present

## 2020-05-24 DIAGNOSIS — Z23 Encounter for immunization: Secondary | ICD-10-CM | POA: Diagnosis not present

## 2020-05-25 DIAGNOSIS — Z20822 Contact with and (suspected) exposure to covid-19: Secondary | ICD-10-CM | POA: Diagnosis not present

## 2020-05-29 DIAGNOSIS — Z20822 Contact with and (suspected) exposure to covid-19: Secondary | ICD-10-CM | POA: Diagnosis not present

## 2020-06-01 DIAGNOSIS — Z20822 Contact with and (suspected) exposure to covid-19: Secondary | ICD-10-CM | POA: Diagnosis not present

## 2020-06-04 DIAGNOSIS — I5023 Acute on chronic systolic (congestive) heart failure: Secondary | ICD-10-CM | POA: Diagnosis not present

## 2020-06-04 DIAGNOSIS — U071 COVID-19: Secondary | ICD-10-CM | POA: Diagnosis not present

## 2020-07-20 DIAGNOSIS — I5023 Acute on chronic systolic (congestive) heart failure: Secondary | ICD-10-CM | POA: Diagnosis not present

## 2020-08-15 DIAGNOSIS — I5023 Acute on chronic systolic (congestive) heart failure: Secondary | ICD-10-CM | POA: Diagnosis not present

## 2020-08-30 DIAGNOSIS — R071 Chest pain on breathing: Secondary | ICD-10-CM | POA: Diagnosis not present

## 2020-08-31 DIAGNOSIS — F0390 Unspecified dementia without behavioral disturbance: Secondary | ICD-10-CM | POA: Diagnosis not present

## 2020-09-01 DIAGNOSIS — F0390 Unspecified dementia without behavioral disturbance: Secondary | ICD-10-CM | POA: Diagnosis not present

## 2020-09-04 DIAGNOSIS — F0390 Unspecified dementia without behavioral disturbance: Secondary | ICD-10-CM | POA: Diagnosis not present

## 2020-09-05 DIAGNOSIS — F0390 Unspecified dementia without behavioral disturbance: Secondary | ICD-10-CM | POA: Diagnosis not present

## 2020-09-06 DIAGNOSIS — F0390 Unspecified dementia without behavioral disturbance: Secondary | ICD-10-CM | POA: Diagnosis not present

## 2020-09-07 DIAGNOSIS — Z20822 Contact with and (suspected) exposure to covid-19: Secondary | ICD-10-CM | POA: Diagnosis not present

## 2020-09-11 DIAGNOSIS — F0390 Unspecified dementia without behavioral disturbance: Secondary | ICD-10-CM | POA: Diagnosis not present

## 2020-09-11 DIAGNOSIS — Z1152 Encounter for screening for COVID-19: Secondary | ICD-10-CM | POA: Diagnosis not present

## 2020-09-13 DIAGNOSIS — I5023 Acute on chronic systolic (congestive) heart failure: Secondary | ICD-10-CM | POA: Diagnosis not present

## 2020-09-14 DIAGNOSIS — Z1152 Encounter for screening for COVID-19: Secondary | ICD-10-CM | POA: Diagnosis not present

## 2020-09-15 DIAGNOSIS — F0390 Unspecified dementia without behavioral disturbance: Secondary | ICD-10-CM | POA: Diagnosis not present

## 2020-09-18 DIAGNOSIS — F0390 Unspecified dementia without behavioral disturbance: Secondary | ICD-10-CM | POA: Diagnosis not present

## 2020-09-18 DIAGNOSIS — Z1152 Encounter for screening for COVID-19: Secondary | ICD-10-CM | POA: Diagnosis not present

## 2020-09-21 DIAGNOSIS — F0390 Unspecified dementia without behavioral disturbance: Secondary | ICD-10-CM | POA: Diagnosis not present

## 2020-09-21 DIAGNOSIS — Z1152 Encounter for screening for COVID-19: Secondary | ICD-10-CM | POA: Diagnosis not present

## 2020-09-22 DIAGNOSIS — F0390 Unspecified dementia without behavioral disturbance: Secondary | ICD-10-CM | POA: Diagnosis not present

## 2020-09-25 DIAGNOSIS — Z1152 Encounter for screening for COVID-19: Secondary | ICD-10-CM | POA: Diagnosis not present

## 2020-09-26 DIAGNOSIS — F0390 Unspecified dementia without behavioral disturbance: Secondary | ICD-10-CM | POA: Diagnosis not present

## 2020-09-27 DIAGNOSIS — F0390 Unspecified dementia without behavioral disturbance: Secondary | ICD-10-CM | POA: Diagnosis not present

## 2020-09-28 DIAGNOSIS — Z1152 Encounter for screening for COVID-19: Secondary | ICD-10-CM | POA: Diagnosis not present

## 2020-09-29 DIAGNOSIS — F0390 Unspecified dementia without behavioral disturbance: Secondary | ICD-10-CM | POA: Diagnosis not present

## 2020-10-02 DIAGNOSIS — F0390 Unspecified dementia without behavioral disturbance: Secondary | ICD-10-CM | POA: Diagnosis not present

## 2020-10-02 DIAGNOSIS — Z1152 Encounter for screening for COVID-19: Secondary | ICD-10-CM | POA: Diagnosis not present

## 2020-10-05 DIAGNOSIS — Z1152 Encounter for screening for COVID-19: Secondary | ICD-10-CM | POA: Diagnosis not present

## 2020-10-09 DIAGNOSIS — Z1152 Encounter for screening for COVID-19: Secondary | ICD-10-CM | POA: Diagnosis not present

## 2020-10-12 DIAGNOSIS — Z1152 Encounter for screening for COVID-19: Secondary | ICD-10-CM | POA: Diagnosis not present

## 2020-10-16 DIAGNOSIS — Z1152 Encounter for screening for COVID-19: Secondary | ICD-10-CM | POA: Diagnosis not present

## 2020-10-18 DIAGNOSIS — B351 Tinea unguium: Secondary | ICD-10-CM | POA: Diagnosis not present

## 2020-10-18 DIAGNOSIS — L602 Onychogryphosis: Secondary | ICD-10-CM | POA: Diagnosis not present

## 2020-10-18 DIAGNOSIS — I70203 Unspecified atherosclerosis of native arteries of extremities, bilateral legs: Secondary | ICD-10-CM | POA: Diagnosis not present

## 2020-10-18 DIAGNOSIS — L601 Onycholysis: Secondary | ICD-10-CM | POA: Diagnosis not present

## 2020-10-19 DIAGNOSIS — Z1152 Encounter for screening for COVID-19: Secondary | ICD-10-CM | POA: Diagnosis not present

## 2020-10-19 DIAGNOSIS — I5023 Acute on chronic systolic (congestive) heart failure: Secondary | ICD-10-CM | POA: Diagnosis not present

## 2020-10-23 DIAGNOSIS — Z1152 Encounter for screening for COVID-19: Secondary | ICD-10-CM | POA: Diagnosis not present

## 2020-10-26 DIAGNOSIS — Z1152 Encounter for screening for COVID-19: Secondary | ICD-10-CM | POA: Diagnosis not present

## 2020-10-30 DIAGNOSIS — Z1152 Encounter for screening for COVID-19: Secondary | ICD-10-CM | POA: Diagnosis not present

## 2020-11-02 DIAGNOSIS — Z1152 Encounter for screening for COVID-19: Secondary | ICD-10-CM | POA: Diagnosis not present

## 2020-11-06 DIAGNOSIS — Z1152 Encounter for screening for COVID-19: Secondary | ICD-10-CM | POA: Diagnosis not present

## 2020-11-09 DIAGNOSIS — Z1152 Encounter for screening for COVID-19: Secondary | ICD-10-CM | POA: Diagnosis not present

## 2020-11-10 DIAGNOSIS — R569 Unspecified convulsions: Secondary | ICD-10-CM | POA: Diagnosis not present

## 2020-11-10 DIAGNOSIS — I5023 Acute on chronic systolic (congestive) heart failure: Secondary | ICD-10-CM | POA: Diagnosis not present

## 2020-11-10 DIAGNOSIS — D649 Anemia, unspecified: Secondary | ICD-10-CM | POA: Diagnosis not present

## 2020-12-20 DIAGNOSIS — I5023 Acute on chronic systolic (congestive) heart failure: Secondary | ICD-10-CM | POA: Diagnosis not present

## 2020-12-21 DIAGNOSIS — Z1152 Encounter for screening for COVID-19: Secondary | ICD-10-CM | POA: Diagnosis not present

## 2020-12-25 DIAGNOSIS — Z1152 Encounter for screening for COVID-19: Secondary | ICD-10-CM | POA: Diagnosis not present

## 2020-12-28 DIAGNOSIS — Z1152 Encounter for screening for COVID-19: Secondary | ICD-10-CM | POA: Diagnosis not present

## 2021-01-01 DIAGNOSIS — Z1152 Encounter for screening for COVID-19: Secondary | ICD-10-CM | POA: Diagnosis not present

## 2021-01-09 DIAGNOSIS — Z1152 Encounter for screening for COVID-19: Secondary | ICD-10-CM | POA: Diagnosis not present

## 2021-01-10 DIAGNOSIS — I5023 Acute on chronic systolic (congestive) heart failure: Secondary | ICD-10-CM | POA: Diagnosis not present

## 2021-01-11 DIAGNOSIS — Z1152 Encounter for screening for COVID-19: Secondary | ICD-10-CM | POA: Diagnosis not present

## 2021-01-15 DIAGNOSIS — Z1152 Encounter for screening for COVID-19: Secondary | ICD-10-CM | POA: Diagnosis not present

## 2021-01-17 DIAGNOSIS — R6889 Other general symptoms and signs: Secondary | ICD-10-CM | POA: Diagnosis not present

## 2021-01-17 DIAGNOSIS — S79911A Unspecified injury of right hip, initial encounter: Secondary | ICD-10-CM | POA: Diagnosis not present

## 2021-01-17 DIAGNOSIS — F0390 Unspecified dementia without behavioral disturbance: Secondary | ICD-10-CM | POA: Diagnosis not present

## 2021-01-17 DIAGNOSIS — S199XXA Unspecified injury of neck, initial encounter: Secondary | ICD-10-CM | POA: Diagnosis not present

## 2021-01-17 DIAGNOSIS — I5023 Acute on chronic systolic (congestive) heart failure: Secondary | ICD-10-CM | POA: Diagnosis not present

## 2021-01-17 DIAGNOSIS — Z880 Allergy status to penicillin: Secondary | ICD-10-CM | POA: Diagnosis not present

## 2021-01-17 DIAGNOSIS — S0990XA Unspecified injury of head, initial encounter: Secondary | ICD-10-CM | POA: Diagnosis not present

## 2021-01-17 DIAGNOSIS — M25551 Pain in right hip: Secondary | ICD-10-CM | POA: Diagnosis not present

## 2021-01-17 DIAGNOSIS — S79912A Unspecified injury of left hip, initial encounter: Secondary | ICD-10-CM | POA: Diagnosis not present

## 2021-01-17 DIAGNOSIS — M25552 Pain in left hip: Secondary | ICD-10-CM | POA: Diagnosis not present

## 2021-01-17 DIAGNOSIS — R102 Pelvic and perineal pain: Secondary | ICD-10-CM | POA: Diagnosis not present

## 2021-01-18 DIAGNOSIS — Z1152 Encounter for screening for COVID-19: Secondary | ICD-10-CM | POA: Diagnosis not present

## 2021-01-22 DIAGNOSIS — Z1152 Encounter for screening for COVID-19: Secondary | ICD-10-CM | POA: Diagnosis not present

## 2021-01-25 DIAGNOSIS — Z1152 Encounter for screening for COVID-19: Secondary | ICD-10-CM | POA: Diagnosis not present

## 2021-01-29 DIAGNOSIS — Z1152 Encounter for screening for COVID-19: Secondary | ICD-10-CM | POA: Diagnosis not present

## 2021-02-01 DIAGNOSIS — Z1152 Encounter for screening for COVID-19: Secondary | ICD-10-CM | POA: Diagnosis not present

## 2021-02-05 DIAGNOSIS — Z1152 Encounter for screening for COVID-19: Secondary | ICD-10-CM | POA: Diagnosis not present

## 2021-02-08 DIAGNOSIS — Z1152 Encounter for screening for COVID-19: Secondary | ICD-10-CM | POA: Diagnosis not present

## 2021-02-13 DIAGNOSIS — Z1152 Encounter for screening for COVID-19: Secondary | ICD-10-CM | POA: Diagnosis not present

## 2021-02-14 DIAGNOSIS — I5023 Acute on chronic systolic (congestive) heart failure: Secondary | ICD-10-CM | POA: Diagnosis not present

## 2021-02-15 DIAGNOSIS — Z1152 Encounter for screening for COVID-19: Secondary | ICD-10-CM | POA: Diagnosis not present

## 2021-02-19 DIAGNOSIS — Z1152 Encounter for screening for COVID-19: Secondary | ICD-10-CM | POA: Diagnosis not present

## 2021-02-22 DIAGNOSIS — Z1152 Encounter for screening for COVID-19: Secondary | ICD-10-CM | POA: Diagnosis not present

## 2021-02-26 DIAGNOSIS — Z1152 Encounter for screening for COVID-19: Secondary | ICD-10-CM | POA: Diagnosis not present

## 2021-03-01 DIAGNOSIS — Z1152 Encounter for screening for COVID-19: Secondary | ICD-10-CM | POA: Diagnosis not present

## 2021-03-05 DIAGNOSIS — Z1152 Encounter for screening for COVID-19: Secondary | ICD-10-CM | POA: Diagnosis not present

## 2021-03-08 DIAGNOSIS — Z1152 Encounter for screening for COVID-19: Secondary | ICD-10-CM | POA: Diagnosis not present

## 2021-03-12 DIAGNOSIS — Z1152 Encounter for screening for COVID-19: Secondary | ICD-10-CM | POA: Diagnosis not present

## 2021-03-13 DIAGNOSIS — I5023 Acute on chronic systolic (congestive) heart failure: Secondary | ICD-10-CM | POA: Diagnosis not present

## 2021-03-15 DIAGNOSIS — Z1152 Encounter for screening for COVID-19: Secondary | ICD-10-CM | POA: Diagnosis not present

## 2021-03-19 DIAGNOSIS — Z1152 Encounter for screening for COVID-19: Secondary | ICD-10-CM | POA: Diagnosis not present

## 2021-03-22 DIAGNOSIS — Z1152 Encounter for screening for COVID-19: Secondary | ICD-10-CM | POA: Diagnosis not present

## 2021-03-26 DIAGNOSIS — Z1152 Encounter for screening for COVID-19: Secondary | ICD-10-CM | POA: Diagnosis not present

## 2021-03-29 DIAGNOSIS — Z1152 Encounter for screening for COVID-19: Secondary | ICD-10-CM | POA: Diagnosis not present

## 2021-04-02 DIAGNOSIS — Z1152 Encounter for screening for COVID-19: Secondary | ICD-10-CM | POA: Diagnosis not present

## 2021-04-05 DIAGNOSIS — Z1152 Encounter for screening for COVID-19: Secondary | ICD-10-CM | POA: Diagnosis not present

## 2021-04-09 DIAGNOSIS — Z1152 Encounter for screening for COVID-19: Secondary | ICD-10-CM | POA: Diagnosis not present

## 2021-04-12 DIAGNOSIS — Z1152 Encounter for screening for COVID-19: Secondary | ICD-10-CM | POA: Diagnosis not present

## 2021-05-18 DIAGNOSIS — D649 Anemia, unspecified: Secondary | ICD-10-CM | POA: Diagnosis not present

## 2021-05-18 DIAGNOSIS — R569 Unspecified convulsions: Secondary | ICD-10-CM | POA: Diagnosis not present

## 2022-01-15 ENCOUNTER — Encounter: Payer: Self-pay | Admitting: *Deleted

## 2023-07-13 DEATH — deceased
